# Patient Record
Sex: Male | Born: 1970 | Race: Black or African American | Hispanic: No | Marital: Single | Smoking: Never smoker
Health system: Southern US, Community
[De-identification: ages and names within clinical notes are randomized; demographics above are authoritative.]

## PROBLEM LIST (undated history)

## (undated) VITALS — BP 116/58 | HR 72 | Temp 98.1°F | Resp 16 | Ht 71.0 in | Wt 162.2 lb

## (undated) VITALS — BP 116/59 | HR 80 | Temp 99.1°F | Resp 16 | Ht 71.0 in | Wt 178.5 lb

## (undated) VITALS — BP 121/66 | HR 78 | Temp 99.1°F | Resp 14

## (undated) DIAGNOSIS — D571 Sickle-cell disease without crisis: Secondary | ICD-10-CM

## (undated) DIAGNOSIS — F329 Major depressive disorder, single episode, unspecified: Secondary | ICD-10-CM

## (undated) DIAGNOSIS — R5382 Chronic fatigue, unspecified: Secondary | ICD-10-CM

## (undated) DIAGNOSIS — I509 Heart failure, unspecified: Secondary | ICD-10-CM

## (undated) DIAGNOSIS — E875 Hyperkalemia: Secondary | ICD-10-CM

## (undated) DIAGNOSIS — F32A Depression, unspecified: Secondary | ICD-10-CM

## (undated) DIAGNOSIS — I471 Supraventricular tachycardia, unspecified: Secondary | ICD-10-CM

## (undated) DIAGNOSIS — G9332 Myalgic encephalomyelitis/chronic fatigue syndrome: Secondary | ICD-10-CM

## (undated) DIAGNOSIS — F419 Anxiety disorder, unspecified: Secondary | ICD-10-CM

## (undated) DIAGNOSIS — M25549 Pain in joints of unspecified hand: Secondary | ICD-10-CM

## (undated) DIAGNOSIS — N2589 Other disorders resulting from impaired renal tubular function: Secondary | ICD-10-CM

## (undated) HISTORY — DX: Major depressive disorder, single episode, unspecified: F32.9

## (undated) HISTORY — DX: Depression, unspecified: F32.A

## (undated) HISTORY — DX: Anxiety disorder, unspecified: F41.9

## (undated) HISTORY — DX: Chronic fatigue, unspecified: R53.82

## (undated) HISTORY — DX: Heart failure, unspecified: I50.9

## (undated) HISTORY — PX: CHOLECYSTECTOMY: SHX55

## (undated) HISTORY — DX: Myalgic encephalomyelitis/chronic fatigue syndrome: G93.32

## (undated) HISTORY — PX: APPENDECTOMY: SHX54

## (undated) HISTORY — PX: SPLENECTOMY: SUR1306

---

## 2006-05-03 ENCOUNTER — Emergency Department (HOSPITAL_COMMUNITY): Admission: EM | Admit: 2006-05-03 | Discharge: 2006-05-03 | Payer: Self-pay | Admitting: *Deleted

## 2006-05-09 ENCOUNTER — Inpatient Hospital Stay (HOSPITAL_COMMUNITY): Admission: EM | Admit: 2006-05-09 | Discharge: 2006-05-10 | Payer: Self-pay | Admitting: *Deleted

## 2006-07-29 ENCOUNTER — Inpatient Hospital Stay (HOSPITAL_COMMUNITY): Admission: EM | Admit: 2006-07-29 | Discharge: 2006-07-31 | Payer: Self-pay | Admitting: Emergency Medicine

## 2006-10-05 ENCOUNTER — Inpatient Hospital Stay (HOSPITAL_COMMUNITY): Admission: EM | Admit: 2006-10-05 | Discharge: 2006-10-06 | Payer: Self-pay | Admitting: Emergency Medicine

## 2006-11-28 ENCOUNTER — Inpatient Hospital Stay (HOSPITAL_COMMUNITY): Admission: EM | Admit: 2006-11-28 | Discharge: 2006-11-30 | Payer: Self-pay | Admitting: Emergency Medicine

## 2007-03-19 ENCOUNTER — Inpatient Hospital Stay (HOSPITAL_COMMUNITY): Admission: EM | Admit: 2007-03-19 | Discharge: 2007-03-22 | Payer: Self-pay | Admitting: Emergency Medicine

## 2007-09-18 ENCOUNTER — Inpatient Hospital Stay (HOSPITAL_COMMUNITY): Admission: EM | Admit: 2007-09-18 | Discharge: 2007-09-23 | Payer: Self-pay | Admitting: Emergency Medicine

## 2007-09-18 ENCOUNTER — Ambulatory Visit: Payer: Self-pay | Admitting: Cardiovascular Disease

## 2007-09-20 ENCOUNTER — Encounter (INDEPENDENT_AMBULATORY_CARE_PROVIDER_SITE_OTHER): Payer: Self-pay | Admitting: Internal Medicine

## 2007-09-21 ENCOUNTER — Encounter: Payer: Self-pay | Admitting: Cardiology

## 2008-03-29 ENCOUNTER — Inpatient Hospital Stay (HOSPITAL_COMMUNITY): Admission: EM | Admit: 2008-03-29 | Discharge: 2008-04-01 | Payer: Self-pay | Admitting: Emergency Medicine

## 2008-10-07 ENCOUNTER — Inpatient Hospital Stay (HOSPITAL_COMMUNITY): Admission: EM | Admit: 2008-10-07 | Discharge: 2008-10-09 | Payer: Self-pay | Admitting: Emergency Medicine

## 2009-07-18 ENCOUNTER — Inpatient Hospital Stay (HOSPITAL_COMMUNITY): Admission: EM | Admit: 2009-07-18 | Discharge: 2009-07-20 | Payer: Self-pay | Admitting: Emergency Medicine

## 2009-12-27 ENCOUNTER — Inpatient Hospital Stay (HOSPITAL_COMMUNITY)
Admission: EM | Admit: 2009-12-27 | Discharge: 2009-12-29 | Payer: Self-pay | Source: Home / Self Care | Admitting: Emergency Medicine

## 2010-02-07 ENCOUNTER — Encounter
Admission: RE | Admit: 2010-02-07 | Discharge: 2010-02-07 | Payer: Self-pay | Source: Home / Self Care | Attending: Internal Medicine | Admitting: Internal Medicine

## 2010-04-16 LAB — CBC
Hemoglobin: 7.1 g/dL — ABNORMAL LOW (ref 13.0–17.0)
MCH: 35.5 pg — ABNORMAL HIGH (ref 26.0–34.0)
MCH: 36.2 pg — ABNORMAL HIGH (ref 26.0–34.0)
MCHC: 35.5 g/dL (ref 30.0–36.0)
MCHC: 36.3 g/dL — ABNORMAL HIGH (ref 30.0–36.0)
MCV: 100.2 fL — ABNORMAL HIGH (ref 78.0–100.0)
Platelets: 276 10*3/uL (ref 150–400)
Platelets: 280 10*3/uL (ref 150–400)
RBC: 1.9 MIL/uL — ABNORMAL LOW (ref 4.22–5.81)
RBC: 1.99 MIL/uL — ABNORMAL LOW (ref 4.22–5.81)
RDW: 25.2 % — ABNORMAL HIGH (ref 11.5–15.5)
RDW: 28.6 % — ABNORMAL HIGH (ref 11.5–15.5)
WBC: 13.2 10*3/uL — ABNORMAL HIGH (ref 4.0–10.5)
WBC: 13.3 10*3/uL — ABNORMAL HIGH (ref 4.0–10.5)
WBC: 13.7 10*3/uL — ABNORMAL HIGH (ref 4.0–10.5)

## 2010-04-16 LAB — SAMPLE TO BLOOD BANK

## 2010-04-16 LAB — DIFFERENTIAL
Basophils Absolute: 0.1 10*3/uL (ref 0.0–0.1)
Basophils Absolute: 0.1 10*3/uL (ref 0.0–0.1)
Basophils Relative: 1 % (ref 0–1)
Eosinophils Absolute: 0.8 10*3/uL — ABNORMAL HIGH (ref 0.0–0.7)
Eosinophils Relative: 6 % — ABNORMAL HIGH (ref 0–5)
Lymphocytes Relative: 43 % (ref 12–46)
Lymphs Abs: 4.5 10*3/uL — ABNORMAL HIGH (ref 0.7–4.0)
Lymphs Abs: 5.7 10*3/uL — ABNORMAL HIGH (ref 0.7–4.0)
Monocytes Absolute: 1.1 10*3/uL — ABNORMAL HIGH (ref 0.1–1.0)
Monocytes Absolute: 1.3 10*3/uL — ABNORMAL HIGH (ref 0.1–1.0)
Monocytes Relative: 10 % (ref 3–12)
Monocytes Relative: 8 % (ref 3–12)
Neutro Abs: 5.3 10*3/uL (ref 1.7–7.7)
Neutrophils Relative %: 40 % — ABNORMAL LOW (ref 43–77)
Neutrophils Relative %: 55 % (ref 43–77)

## 2010-04-16 LAB — URINALYSIS, ROUTINE W REFLEX MICROSCOPIC
Ketones, ur: NEGATIVE mg/dL
Leukocytes, UA: NEGATIVE
Nitrite: NEGATIVE
Protein, ur: NEGATIVE mg/dL
Urobilinogen, UA: 1 mg/dL (ref 0.0–1.0)
pH: 6 (ref 5.0–8.0)

## 2010-04-16 LAB — TYPE AND SCREEN
ABO/RH(D): A POS
Unit division: 0

## 2010-04-16 LAB — RETICULOCYTES
RBC.: 1.86 MIL/uL — ABNORMAL LOW (ref 4.22–5.81)
Retic Ct Pct: 18.6 % — ABNORMAL HIGH (ref 0.4–3.1)

## 2010-04-16 LAB — PREPARE RBC (CROSSMATCH)

## 2010-04-16 LAB — BASIC METABOLIC PANEL
Chloride: 111 mEq/L (ref 96–112)
GFR calc Af Amer: >60 mL/min (ref >60)
Potassium: 4.7 mEq/L (ref 3.5–5.1)

## 2010-04-21 LAB — URINALYSIS, ROUTINE W REFLEX MICROSCOPIC
Glucose, UA: NEGATIVE mg/dL
Leukocytes, UA: NEGATIVE
Protein, ur: 30 mg/dL — AB
pH: 6.5 (ref 5.0–8.0)

## 2010-04-21 LAB — DIFFERENTIAL
Eosinophils Absolute: 0.7 10*3/uL (ref 0.0–0.7)
Lymphs Abs: 5.8 10*3/uL — ABNORMAL HIGH (ref 0.7–4.0)
Monocytes Absolute: 1.5 10*3/uL — ABNORMAL HIGH (ref 0.1–1.0)
Neutrophils Relative %: 57 % (ref 43–77)

## 2010-04-21 LAB — BASIC METABOLIC PANEL
CO2: 23 mEq/L (ref 19–32)
CO2: 24 mEq/L (ref 19–32)
CO2: 24 mEq/L (ref 19–32)
Chloride: 108 mEq/L (ref 96–112)
Chloride: 111 mEq/L (ref 96–112)
Chloride: 115 mEq/L — ABNORMAL HIGH (ref 96–112)
Creatinine, Ser: 0.6 mg/dL (ref 0.4–1.5)
Creatinine, Ser: 0.72 mg/dL (ref 0.4–1.5)
Creatinine, Ser: 0.89 mg/dL (ref 0.4–1.5)
GFR calc Af Amer: >60 mL/min (ref >60)
GFR calc Af Amer: >60 mL/min (ref >60)
GFR calc Af Amer: >60 mL/min (ref >60)
Glucose, Bld: 100 mg/dL — ABNORMAL HIGH (ref 70–99)
Potassium: 4.7 mEq/L (ref 3.5–5.1)
Potassium: 5.3 mEq/L — ABNORMAL HIGH (ref 3.5–5.1)
Sodium: 139 mEq/L (ref 135–145)

## 2010-04-21 LAB — CBC
Hemoglobin: 7.5 g/dL — ABNORMAL LOW (ref 13.0–17.0)
Hemoglobin: 8.3 g/dL — ABNORMAL LOW (ref 13.0–17.0)
MCHC: 34.5 g/dL (ref 30.0–36.0)
MCHC: 35.8 g/dL (ref 30.0–36.0)
MCHC: 36.1 g/dL — ABNORMAL HIGH (ref 30.0–36.0)
MCV: 95.9 fL (ref 78.0–100.0)
MCV: 97.7 fL (ref 78.0–100.0)
Platelets: 261 10*3/uL (ref 150–400)
RBC: 2.21 MIL/uL — ABNORMAL LOW (ref 4.22–5.81)
RBC: 2.4 MIL/uL — ABNORMAL LOW (ref 4.22–5.81)
RDW: 26.4 % — ABNORMAL HIGH (ref 11.5–15.5)
RDW: 29 % — ABNORMAL HIGH (ref 11.5–15.5)
WBC: 14.4 10*3/uL — ABNORMAL HIGH (ref 4.0–10.5)

## 2010-04-21 LAB — CROSSMATCH

## 2010-04-21 LAB — URINE MICROSCOPIC-ADD ON

## 2010-04-21 LAB — RETICULOCYTES
RBC.: 1.8 MIL/uL — ABNORMAL LOW (ref 4.22–5.81)
Retic Ct Pct: 19.1 % — ABNORMAL HIGH (ref 0.4–3.1)

## 2010-04-21 LAB — CULTURE, BLOOD (ROUTINE X 2)

## 2010-04-21 LAB — TSH: TSH: 2.081 u[IU]/mL (ref 0.350–4.500)

## 2010-05-10 LAB — CROSSMATCH

## 2010-05-10 LAB — CBC
Hemoglobin: 8.8 g/dL — ABNORMAL LOW (ref 13.0–17.0)
MCHC: 35.8 g/dL (ref 30.0–36.0)
MCHC: 36.3 g/dL — ABNORMAL HIGH (ref 30.0–36.0)
MCV: 103.5 fL — ABNORMAL HIGH (ref 78.0–100.0)
Platelets: 303 10*3/uL (ref 150–400)
RBC: 2.43 MIL/uL — ABNORMAL LOW (ref 4.22–5.81)
WBC: 21.2 10*3/uL — ABNORMAL HIGH (ref 4.0–10.5)

## 2010-05-10 LAB — DIFFERENTIAL
Basophils Absolute: 0.2 10*3/uL — ABNORMAL HIGH (ref 0.0–0.1)
Lymphs Abs: 7 10*3/uL — ABNORMAL HIGH (ref 0.7–4.0)
Monocytes Absolute: 2.1 10*3/uL — ABNORMAL HIGH (ref 0.1–1.0)
Neutrophils Relative %: 51 % (ref 43–77)

## 2010-05-10 LAB — POCT I-STAT, CHEM 8
BUN: 18 mg/dL (ref 6–23)
Chloride: 106 mEq/L (ref 96–112)
Creatinine, Ser: 0.8 mg/dL (ref 0.4–1.5)
Glucose, Bld: 96 mg/dL (ref 70–99)
HCT: 19 % — ABNORMAL LOW (ref 39.0–52.0)
Potassium: 4.5 mEq/L (ref 3.5–5.1)

## 2010-05-10 LAB — BASIC METABOLIC PANEL
CO2: 26 mEq/L (ref 19–32)
Calcium: 8.2 mg/dL — ABNORMAL LOW (ref 8.4–10.5)
Glucose, Bld: 94 mg/dL (ref 70–99)
Sodium: 135 mEq/L (ref 135–145)

## 2010-05-10 LAB — HEMOGLOBIN AND HEMATOCRIT, BLOOD: HCT: 23.9 % — ABNORMAL LOW (ref 39.0–52.0)

## 2010-05-16 ENCOUNTER — Encounter: Payer: Self-pay | Admitting: Internal Medicine

## 2010-05-21 LAB — TSH: TSH: 1.543 u[IU]/mL (ref 0.350–4.500)

## 2010-05-21 LAB — COMPREHENSIVE METABOLIC PANEL
ALT: 49 U/L (ref 0–53)
AST: 99 U/L — ABNORMAL HIGH (ref 0–37)
Albumin: 2.7 g/dL — ABNORMAL LOW (ref 3.5–5.2)
Albumin: 2.8 g/dL — ABNORMAL LOW (ref 3.5–5.2)
Alkaline Phosphatase: 90 U/L (ref 39–117)
BUN: 12 mg/dL (ref 6–23)
BUN: 7 mg/dL (ref 6–23)
CO2: 25 mEq/L (ref 19–32)
CO2: 26 mEq/L (ref 19–32)
Calcium: 8.2 mg/dL — ABNORMAL LOW (ref 8.4–10.5)
Calcium: 8.3 mg/dL — ABNORMAL LOW (ref 8.4–10.5)
Chloride: 106 mEq/L (ref 96–112)
Chloride: 109 mEq/L (ref 96–112)
Creatinine, Ser: 0.53 mg/dL (ref 0.4–1.5)
Creatinine, Ser: 0.6 mg/dL (ref 0.4–1.5)
Creatinine, Ser: 0.62 mg/dL (ref 0.4–1.5)
GFR calc Af Amer: >60 mL/min (ref >60)
GFR calc non Af Amer: >60 mL/min (ref >60)
Glucose, Bld: 88 mg/dL (ref 70–99)
Potassium: 4.3 mEq/L (ref 3.5–5.1)
Potassium: 4.3 mEq/L (ref 3.5–5.1)
Sodium: 136 mEq/L (ref 135–145)
Total Bilirubin: 3 mg/dL — ABNORMAL HIGH (ref 0.3–1.2)
Total Bilirubin: 4.2 mg/dL — ABNORMAL HIGH (ref 0.3–1.2)
Total Protein: 6.3 g/dL (ref 6.0–8.3)

## 2010-05-21 LAB — CK TOTAL AND CKMB (NOT AT ARMC)
CK, MB: 0.4 ng/mL (ref 0.3–4.0)
CK, MB: 0.5 ng/mL (ref 0.3–4.0)
CK, MB: 0.5 ng/mL (ref 0.3–4.0)
Relative Index: INVALID (ref 0.0–2.5)
Relative Index: INVALID (ref 0.0–2.5)
Total CK: 24 U/L (ref 7–232)
Total CK: 32 U/L (ref 7–232)

## 2010-05-21 LAB — RETICULOCYTES
Retic Count, Absolute: 410.1 10*3/uL — ABNORMAL HIGH (ref 19.0–186.0)
Retic Ct Pct: 21.7 % — ABNORMAL HIGH (ref 0.4–3.1)

## 2010-05-21 LAB — CBC
HCT: 19.4 % — ABNORMAL LOW (ref 39.0–52.0)
HCT: 26.3 % — ABNORMAL LOW (ref 39.0–52.0)
Hemoglobin: 7.1 g/dL — CL (ref 13.0–17.0)
Hemoglobin: 9.1 g/dL — ABNORMAL LOW (ref 13.0–17.0)
MCV: 102.3 fL — ABNORMAL HIGH (ref 78.0–100.0)
MCV: 102.5 fL — ABNORMAL HIGH (ref 78.0–100.0)
Platelets: 338 10*3/uL (ref 150–400)
RBC: 1.89 MIL/uL — ABNORMAL LOW (ref 4.22–5.81)
RBC: 2.6 MIL/uL — ABNORMAL LOW (ref 4.22–5.81)
WBC: 10.9 10*3/uL — ABNORMAL HIGH (ref 4.0–10.5)
WBC: 12.4 10*3/uL — ABNORMAL HIGH (ref 4.0–10.5)
WBC: 21.4 10*3/uL — ABNORMAL HIGH (ref 4.0–10.5)

## 2010-05-21 LAB — DIFFERENTIAL
Basophils Absolute: 0 10*3/uL (ref 0.0–0.1)
Basophils Absolute: 0.1 10*3/uL (ref 0.0–0.1)
Basophils Relative: 0 % (ref 0–1)
Basophils Relative: 1 % (ref 0–1)
Basophils Relative: 2 % — ABNORMAL HIGH (ref 0–1)
Eosinophils Absolute: 0.8 10*3/uL — ABNORMAL HIGH (ref 0.0–0.7)
Eosinophils Relative: 6 % — ABNORMAL HIGH (ref 0–5)
Lymphocytes Relative: 33 % (ref 12–46)
Lymphocytes Relative: 43 % (ref 12–46)
Monocytes Absolute: 1 10*3/uL (ref 0.1–1.0)
Monocytes Relative: 6 % (ref 3–12)
Neutro Abs: 12.4 10*3/uL — ABNORMAL HIGH (ref 1.7–7.7)
Neutro Abs: 5 10*3/uL (ref 1.7–7.7)
Neutrophils Relative %: 40 % — ABNORMAL LOW (ref 43–77)
Neutrophils Relative %: 46 % (ref 43–77)

## 2010-05-21 LAB — CULTURE, BLOOD (ROUTINE X 2)
Culture: NO GROWTH
Culture: NO GROWTH

## 2010-05-21 LAB — POCT I-STAT, CHEM 8
Creatinine, Ser: 0.8 mg/dL (ref 0.4–1.5)
Hemoglobin: 7.1 g/dL — CL (ref 13.0–17.0)
Sodium: 141 mEq/L (ref 135–145)
TCO2: 26 mmol/L (ref 0–100)

## 2010-05-21 LAB — MAGNESIUM: Magnesium: 2.1 mg/dL (ref 1.5–2.5)

## 2010-05-21 LAB — URINALYSIS, ROUTINE W REFLEX MICROSCOPIC
Bilirubin Urine: NEGATIVE
Glucose, UA: NEGATIVE mg/dL
Ketones, ur: NEGATIVE mg/dL
Leukocytes, UA: NEGATIVE
Protein, ur: 100 mg/dL — AB

## 2010-05-21 LAB — PATHOLOGIST SMEAR REVIEW

## 2010-05-21 LAB — TROPONIN I: Troponin I: 0.01 ng/mL (ref 0.00–0.06)

## 2010-05-21 LAB — CROSSMATCH

## 2010-05-21 LAB — PHOSPHORUS: Phosphorus: 4.1 mg/dL (ref 2.3–4.6)

## 2010-05-21 LAB — BRAIN NATRIURETIC PEPTIDE: Pro B Natriuretic peptide (BNP): 95.7 pg/mL (ref 0.0–100.0)

## 2010-05-21 LAB — URINE MICROSCOPIC-ADD ON

## 2010-05-21 LAB — HOMOCYSTEINE: Homocysteine: 9.9 umol/L (ref 4.0–15.4)

## 2010-05-22 ENCOUNTER — Emergency Department (HOSPITAL_COMMUNITY): Payer: BC Managed Care – PPO

## 2010-05-22 ENCOUNTER — Inpatient Hospital Stay (HOSPITAL_COMMUNITY)
Admission: EM | Admit: 2010-05-22 | Discharge: 2010-05-27 | DRG: 574 | Disposition: A | Discharge status: Home / Self Care | Payer: BC Managed Care – PPO | Attending: Internal Medicine | Admitting: Internal Medicine

## 2010-05-22 DIAGNOSIS — D5700 Hb-SS disease with crisis, unspecified: Principal | ICD-10-CM | POA: Diagnosis present

## 2010-05-22 DIAGNOSIS — K599 Functional intestinal disorder, unspecified: Secondary | ICD-10-CM | POA: Diagnosis present

## 2010-05-22 DIAGNOSIS — J189 Pneumonia, unspecified organism: Secondary | ICD-10-CM | POA: Diagnosis present

## 2010-05-22 DIAGNOSIS — I509 Heart failure, unspecified: Secondary | ICD-10-CM | POA: Diagnosis not present

## 2010-05-22 DIAGNOSIS — Z9089 Acquired absence of other organs: Secondary | ICD-10-CM

## 2010-05-22 DIAGNOSIS — N289 Disorder of kidney and ureter, unspecified: Secondary | ICD-10-CM | POA: Diagnosis present

## 2010-05-22 DIAGNOSIS — E860 Dehydration: Secondary | ICD-10-CM | POA: Diagnosis present

## 2010-05-22 LAB — HEPATIC FUNCTION PANEL
ALT: 44 U/L (ref 0–53)
Albumin: 3.1 g/dL — ABNORMAL LOW (ref 3.5–5.2)
Alkaline Phosphatase: 116 U/L (ref 39–117)
Total Protein: 6.9 g/dL (ref 6.0–8.3)

## 2010-05-22 LAB — DIFFERENTIAL
Blasts: 0 %
Metamyelocytes Relative: 0 %
Monocytes Relative: 3 % (ref 3–12)
Myelocytes: 0 %
nRBC: 0 /100 WBC

## 2010-05-22 LAB — CBC
HCT: 17.8 % — ABNORMAL LOW (ref 39.0–52.0)
MCV: 91.8 fL (ref 78.0–100.0)
RBC: 1.94 MIL/uL — ABNORMAL LOW (ref 4.22–5.81)
WBC: 22.5 10*3/uL — ABNORMAL HIGH (ref 4.0–10.5)

## 2010-05-22 LAB — BLOOD GAS, ARTERIAL
Acid-base deficit: 3.5 mmol/L — ABNORMAL HIGH (ref 0.0–2.0)
Drawn by: 30996
O2 Content: 4 L/min
O2 Saturation: 94.1 %
TCO2: 21.1 mmol/L (ref 0–100)
pO2, Arterial: 90.2 mmHg (ref 80.0–100.0)

## 2010-05-22 LAB — BASIC METABOLIC PANEL
Chloride: 107 mEq/L (ref 96–112)
GFR calc Af Amer: 52 mL/min — ABNORMAL LOW (ref >60)
Potassium: 4.9 mEq/L (ref 3.5–5.1)

## 2010-05-22 LAB — URINALYSIS, ROUTINE W REFLEX MICROSCOPIC
Glucose, UA: NEGATIVE mg/dL
pH: 5.5 (ref 5.0–8.0)

## 2010-05-22 LAB — RETICULOCYTES: Retic Count, Absolute: 349.2 10*3/uL — ABNORMAL HIGH (ref 19.0–186.0)

## 2010-05-22 LAB — URINE MICROSCOPIC-ADD ON

## 2010-05-23 ENCOUNTER — Inpatient Hospital Stay (HOSPITAL_COMMUNITY): Payer: BC Managed Care – PPO

## 2010-05-23 LAB — CBC
HCT: 20.4 % — ABNORMAL LOW (ref 39.0–52.0)
Hemoglobin: 7.2 g/dL — ABNORMAL LOW (ref 13.0–17.0)
MCH: 32.6 pg (ref 26.0–34.0)
MCHC: 35.3 g/dL (ref 30.0–36.0)
MCV: 92.3 fL (ref 78.0–100.0)

## 2010-05-23 LAB — COMPREHENSIVE METABOLIC PANEL
ALT: 45 U/L (ref 0–53)
AST: 132 U/L — ABNORMAL HIGH (ref 0–37)
Alkaline Phosphatase: 87 U/L (ref 39–117)
Calcium: 7.9 mg/dL — ABNORMAL LOW (ref 8.4–10.5)
GFR calc Af Amer: >60 mL/min (ref >60)
Glucose, Bld: 92 mg/dL (ref 70–99)
Potassium: 4.3 mEq/L (ref 3.5–5.1)
Sodium: 138 mEq/L (ref 135–145)
Total Protein: 6.4 g/dL (ref 6.0–8.3)

## 2010-05-23 LAB — URINE CULTURE
Colony Count: NO GROWTH
Culture: NO GROWTH

## 2010-05-24 LAB — TYPE AND SCREEN
Antibody Screen: NEGATIVE
Unit division: 0

## 2010-05-24 LAB — CBC
HCT: 23.6 % — ABNORMAL LOW (ref 39.0–52.0)
MCHC: 34.3 g/dL (ref 30.0–36.0)
Platelets: 267 10*3/uL (ref 150–400)
RDW: 26.7 % — ABNORMAL HIGH (ref 11.5–15.5)

## 2010-05-24 LAB — DIFFERENTIAL
Basophils Absolute: 0.1 10*3/uL (ref 0.0–0.1)
Eosinophils Absolute: 0.7 10*3/uL (ref 0.0–0.7)
Eosinophils Relative: 5 % (ref 0–5)
Lymphs Abs: 3.9 10*3/uL (ref 0.7–4.0)
Monocytes Absolute: 1.8 10*3/uL — ABNORMAL HIGH (ref 0.1–1.0)
Neutrophils Relative %: 50 % (ref 43–77)

## 2010-05-24 LAB — BRAIN NATRIURETIC PEPTIDE: Pro B Natriuretic peptide (BNP): 120 pg/mL — ABNORMAL HIGH (ref 0.0–100.0)

## 2010-05-24 LAB — COMPREHENSIVE METABOLIC PANEL
ALT: 40 U/L (ref 0–53)
Albumin: 2.8 g/dL — ABNORMAL LOW (ref 3.5–5.2)
Calcium: 8.3 mg/dL — ABNORMAL LOW (ref 8.4–10.5)
Glucose, Bld: 93 mg/dL (ref 70–99)
Sodium: 139 mEq/L (ref 135–145)
Total Protein: 6.4 g/dL (ref 6.0–8.3)

## 2010-05-24 LAB — MAGNESIUM: Magnesium: 2.4 mg/dL (ref 1.5–2.5)

## 2010-05-25 LAB — RETICULOCYTES
Retic Count, Absolute: 554.7 10*3/uL — ABNORMAL HIGH (ref 19.0–186.0)
Retic Ct Pct: 21.5 % — ABNORMAL HIGH (ref 0.4–3.1)

## 2010-05-25 LAB — COMPREHENSIVE METABOLIC PANEL
AST: 79 U/L — ABNORMAL HIGH (ref 0–37)
Albumin: 2.8 g/dL — ABNORMAL LOW (ref 3.5–5.2)
Calcium: 8.4 mg/dL (ref 8.4–10.5)
Creatinine, Ser: 0.66 mg/dL (ref 0.4–1.5)
GFR calc Af Amer: >60 mL/min (ref >60)
Total Protein: 6.4 g/dL (ref 6.0–8.3)

## 2010-05-25 LAB — CBC
Platelets: 249 10*3/uL (ref 150–400)
RBC: 2.58 MIL/uL — ABNORMAL LOW (ref 4.22–5.81)
WBC: 11.3 10*3/uL — ABNORMAL HIGH (ref 4.0–10.5)

## 2010-05-26 ENCOUNTER — Inpatient Hospital Stay (HOSPITAL_COMMUNITY): Payer: BC Managed Care – PPO

## 2010-05-27 LAB — CBC
HCT: 23.3 % — ABNORMAL LOW (ref 39.0–52.0)
Hemoglobin: 7.8 g/dL — ABNORMAL LOW (ref 13.0–17.0)
MCH: 32.1 pg (ref 26.0–34.0)
MCHC: 33.5 g/dL (ref 30.0–36.0)

## 2010-05-27 LAB — BASIC METABOLIC PANEL
BUN: 13 mg/dL (ref 6–23)
Chloride: 108 mEq/L (ref 96–112)
Glucose, Bld: 92 mg/dL (ref 70–99)
Potassium: 4.4 mEq/L (ref 3.5–5.1)

## 2010-05-27 LAB — BRAIN NATRIURETIC PEPTIDE: Pro B Natriuretic peptide (BNP): 81.6 pg/mL (ref 0.0–100.0)

## 2010-05-28 LAB — CULTURE, BLOOD (ROUTINE X 2)
Culture  Setup Time: 201204182203
Culture: NO GROWTH
Culture: NO GROWTH

## 2010-05-30 NOTE — H&P (Signed)
NAMESEANN, GENTHER              ACCOUNT NO.:  [id number]  MEDICAL RECORD NO.:  [id number]           PATIENT TYPE:  E  LOCATION:  WLED                         FACILITY:  [facility]  PHYSICIAN:  Lonia Blood, M.D.DATE OF BIRTH:  24-Jan-1971  DATE OF ADMISSION:  05/22/2010 DATE OF DISCHARGE:                             HISTORY & PHYSICAL   PRIMARY CARE PHYSICIAN:  Eric L. August Saucer, MD  CHIEF COMPLAINT:  Chest, shoulder, hip and back pain.  HISTORY OF PRESENT ILLNESS:  Mr. [name] is a very pleasant 40- year-old gentleman with a longstanding history of sickle cell disease. He has a history of splenectomy.  He reports that he has two to three sickle cell crises in a year at max.  Typically he presents for these with chest, shoulder, hip and back pain.  This is exactly the presentation that he gives today.  His symptoms began approximately 14 to 16 hours ago.  His chest pain has been primarily lateral and pleuritic in nature.  He has had some dyspnea but not severe.  He has had some nausea and poor p.o. intake.  He denies vomiting.  He denies diarrhea.  He denies appreciable fevers or chills, though he has been noted to have a fever during his stay in the emergency room.  He denies erythematous or warm joints but states that his hips and  shoulders ache bilaterally.  He denies upper respiratory type symptoms.  He denies dysuria, polyuria or hematuria.  REVIEW OF SYSTEMS:  A comprehensive review of systems is completed and 10 systems are unremarkable with the exception to the positive elements noted in the history of present illness above.  PAST MEDICAL HISTORY: 1. Sickle cell disease. 2. 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 3. Status post surgical splenectomy. 4. Status post cholecystectomy. 5. Status post appendectomy.  OUTPATIENT MEDICATIONS: 1. Percocet 2.  Restoril. 3. Folate. 4. Methadone. 5. Ibuprofen. 6. Vitamin C.  ALLERGIES:  No known drug allergies.  FAMILY HISTORY:  Negative for premature coronary artery disease, diabetes or hypertension in both maternal and paternal lineages.  The patient has a daughter with sickle cell trait.  SOCIAL HISTORY:  The patient does not smoke, drink alcohol or use illicit drugs.  He works with [employer].  He lives in the [place] area but is originally from [place].  DATA REVIEW:  Showed white count is elevated 23,000, platelet count is normal, hemoglobin as low as 6.3 with MCV of 92.  Sodium is normal at 139 with normal chloride, bicarb and potassium.  BUN is elevated at 30 with a creatinine of 1.76.  Serum glucose of 103, calcium is normal. Alk phos is normal.  AST is elevated at 148 with a normal ALT, total bili is markedly elevated at almost 11, albumin is 3.1. Absolute reticulocyte count is markedly elevated at 350.  Urinalysis is positive for nitrite and small leukocyte esterase, but only 0-2 white blood cells and 0-2 red blood cells.  Chest x-ray reveals cardiomegaly with venous hypertension and  prominence of the interstitial markings consistent with edema versus pneumonia.  PHYSICAL EXAMINATION:  VITAL SIGNS:  Temperature 99.9, blood pressure 133/66, heart rate 128, respiratory rate 20, O2 saturation as low as 75% on room air but presently 92% to 94% on 4 L per minute nasal cannula. GENERAL:  Well-developed, well-nourished gentleman, in no acute respiratory distress.  HEENT:  Normocephalic, atraumatic.  Pupils equal, round, react to light and accommodation.  Extraocular muscles intact bilaterally.  Sclerae are markedly icteric and the mucous membranes of the oral and pharyngeal cavity are markedly dry.  NECK:  No JVD.  LUNGS: Very faint fine crackles diffusely with no wheezes and good air movement throughout all fields otherwise.  CARDIOVASCULAR:  Tachycardic but regular without gallop or  rub with normal S1, S2.  ABDOMEN:  Nontender, nondistended.  Bowel sounds present.  No organomegaly, no rebound or ascites.  EXTREMITIES:  No significant cyanosis, clubbing, edema bilateral lower extremities.  NEUROLOGIC:  Alert and oriented x4. Cranial II-XII intact bilaterally, 5/5 strength bilateral upper and lower extremities.  Intact sensation to touch throughout.  IMPRESSION AND PLAN: 1. Acute vaso-occlusive sickle cell pain crisis:  The patient will be     admitted to the hospital.  We will utilize the standing sickle cell     admit order form.  The patient will receive judicious volumes of     half-normal saline in that he is clearly dehydrated and also     suffering with an acute sickling crisis.  We will consult Dr. August Saucer,     our local sickle cell specialist, to consider assuming his care in     the morning.  The patient is known to Dr. August Saucer in the outpatient     setting. 2. Acute on chronic anemia secondary to hemolysis due to sickle cell     disease:  The patient has a baseline hemoglobin of 7.  He reports     that he routinely requires transfusions at the time of his crises     on bi- to tri- annual basis.  The patient has already been given 1     unit of blood in the emergency room.  We will hold on any further     transfusion at this time and defer to Dr. Diamantina Providence expertise in     regard to the necessity of such. 3. Acute renal insufficiency:  The patient has a normal baseline     creatinine.  He presents with a significantly elevated creatinine     at approximately 1.8 today.  Clinically he is severely dry.  This     appears to most likely be related to significant volume     depletion/prerenal azotemia.  We will aggressively hydrate the     patient.  We will recheck his VWU:[id number] ratio in the morning. 4. Possible urinary tract infection:  The patient's urinalysis is not     normal.  This could be simply a sequelae of his severe dehydration     and sickling.  The  patient will empirically be covered with     antibiotics as discussed below and this should provide appropriate     coverage for potential urinary pathogens as well. 5. Possible pneumonia:  We must consider the possibility of pneumonia     in this patient.  It is notable that he is status post splenectomy.     We must also consider the possibility of a chest syndrome.  At the     present  time the patient is stable.  His O2 saturations have     responded nicely to volume as well as oxygen.  We will continue to     aggressively hydrate the patient.  We will monitor his O2     saturations closely and monitor him clinically.  Given the fact     that he does not have a spleen, we will empirically cover him for     community-acquired pathogens.  Levaquin should be adequate at this     time.  We will follow the patient clinically.  Blood cultures     have been accomplished. 6. Marked dehydration:  The patient is clearly markedly dehydrated.     As discussed above, he will be hydrated aggressively.     Lonia Blood, M.D.     JTM/MEDQ  D:  05/22/2010  T:  05/22/2010  Job:  [id number]  cc:   Minerva Areola L. August Saucer, M.D. [address] [address] [place] [address]  Electronically Signed by Jetty Duhamel M.D. on 05/30/2010 08:44:48 AM

## 2010-06-18 NOTE — Discharge Summary (Signed)
[name], [name]              ACCOUNT NO.:  [id number]   MEDICAL RECORD NO.:  [id number]          PATIENT TYPE:  INP   LOCATION:  1430                         FACILITY:  [facility]   PHYSICIAN:  Ruthy Dick, MD    DATE OF BIRTH:  [birth year]   DATE OF ADMISSION:  03/29/2008  DATE OF DISCHARGE:  04/01/2008                               DISCHARGE SUMMARY   REASON FOR ADMISSION:  Sickle cell painful crisis.   FINAL DISCHARGE DIAGNOSES:  1. Sickle cell painful crisis.  2. Hypoxia, resolved.  3. Sickle cell anemia, status post transfusion of 2 units of packed      red blood cells.  4. Chronic pain syndrome, narcotic dependent.   PROCEDURES:  1. CT angiogram of the chest was done and was read as being sub-      optimal but no evidence of central pulmonary embolus. At this time,      the patient symptoms of hypoxia also resolved.   HISTORY OF PRESENT ILLNESS:  This is a 40 year old gentleman with sickle  cell disease, who woke up in the middle of the night with severe chest  and back pain. Because of this, he came to the hospital and there was a  possibility of acute chest syndrome being entertained. However, his  symptoms resolved with pain medications and the CT angiogram did not  show central pulmonary embolus. He was also on IV hydration, which also  helped relieve his symptoms. So far today, the patient is doing very  well. He says he is ready to go home. No more chest pain, no shortness  of breath, no pains whatsoever.   PHYSICAL EXAMINATION:  VITAL SIGNS:  Temperature 98.0, pulse 90,  respiratory rate 20, blood pressure 110/64, saturating 92% on room air.  CHEST:  Clear to auscultation bilaterally.  ABDOMEN:  Soft, nontender.  EXTREMITIES:  No clubbing, cyanosis, or edema.  CARDIOVASCULAR:  First and second heart sound.  NEUROLOGIC:  Nonfocal.   FOLLOWUP:  The patient has been asked to see a primary care physician  here, since he usually goes to [facility] every 2 to 3  months. I have  advised the patient to see a doctor in the next 1 to 2 weeks since he  was in the hospital. I will also asked the social service team to also  help him in looking for a primary care physician.   DISCHARGE MEDICATIONS:  1. Oxycodone IR 20 mg p.o. q.6 hours p.r.n. for pain. I will give him      a prescription for 25 tablets.   DURATION OF DISCHARGE PLANNING:  Greater than 30 minutes.   SPECIAL INSTRUCTIONS:  The patient has been advised to return to the  emergency room for any recurrent or worsening of symptoms.      Ruthy Dick, MD  Electronically Signed     GU/MEDQ  D:  04/01/2008  T:  04/01/2008  Job:  [id number]

## 2010-06-18 NOTE — H&P (Signed)
[name], [name]              ACCOUNT NO.:  [id number]   MEDICAL RECORD NO.:  [id number]          PATIENT TYPE:  INP   LOCATION:  0107                         FACILITY:  [facility]   PHYSICIAN:  Thomasenia Bottoms, MDDATE OF BIRTH:  10/19/1970   DATE OF ADMISSION:  07/29/2006  DATE OF DISCHARGE:                              HISTORY & PHYSICAL   CHIEF COMPLAINT:  Pain.   HISTORY OF PRESENT ILLNESS:  Mr. Hensch is a 40 year old gentleman with  history of sickle cell disease, who presents with what he described as  his typical sickle cell crisis pain.  The pain started at about 1 a.m.  his morning.  He has not been able to get it better home with his p.o.  methadone, and the pain is increasing.  Therefore, he presented to the  hospital.  The patient also says he feels like he needs a transfusion.  He does not know the particular trigger, but feels like maybe the heat  of greater than 90 degrees has partially been to blame.  The patient's  pain is worse in his back and hips, but also extends to his extremities.  He had some in his chest.  He denies any shortness of breath or fever.   PAST MEDICAL HISTORY:  Significant for sickle cell anemia.  His last  crisis requiring hospitalization was at this hospital just 2 months ago.  He did require transfusion at that time.  His past medical history is  also significant for cholecystectomy, appendectomy and splenectomy.  He  says usually he has approximately one crisis per year, but he has been  under tremendous stress since being transferred here to [place]  with his job; has had more crises lately.   MEDICATIONS ON ARRIVAL:  Include:  folic acid or multivitamin, and then  methadone p.r.n. pain crisis.   SOCIAL HISTORY:  The patient moved here from [place] and his primary  doctor is still in [place].  He does not have one here at this time.  He  does not smoke cigarettes, drink alcohol or use any illicit drugs.   FAMILY HISTORY:   Is significant for hypertension and clearly sickle cell  trait.   VITALS ON ARRIVAL:  Blood pressure 116/63, temperature 98.7, heart rate  94, respiratory rate 16.  His O2 saturations were 86% on room air.   REVIEW OF SYSTEMS:  CONSTITUTIONAL:  Appetite fine.  No night sweats.  HEENT:  No visual disturbance.  No severe headaches.  No severe sinus  trouble.  CARDIOVASCULAR:  No significant chest pain, other than some  minor chest pain with his crisis (which is his usual).  No lower  extremity edema.  RESPIRATORY:  No shortness of breath or cough.  GI:  Has not vomited blood, seen any blood in stool; no abdominal pain or  vomiting at all.  GU: No hematuria.  MUSCULOSKELETAL:  He certainly is  having some joint pains with his crisis.  NEUROLOGIC:  No history of  seizures.  No asymmetric weakness. INTEGUMENTARY:  No open lesions or  rashes.   PHYSICAL EXAMINATION:  GENERAL:  The patient is a well-nourished, well-  developed and in no acute distress.  HEENT:  Exam is normocephalic, atraumatic.  Pupils are round and sclerae  are icteric bilaterally.  Oral mucosa moist.  NECK:  Supple; no lymphadenopathy, no thyromegaly, no jugular venous  distension.  CARDIAC EXAM:  Regular rate and rhythm.  No murmurs, gallops or rubs.  LUNGS:  Clear to auscultation bilaterally; with no wheezes, rhonchi or  rales.  ABDOMEN:  Soft, nontender, nondistended.  Normoactive bowel sounds.  No  masses are appreciated.  EXTREMITIES:  Reveal no evidence of clubbing, cyanosis or edema.  His DP  pulses are palpable.  SKIN:  Warm and dry with no open lesions or rashes  noted.  MUSCULOSKELETAL:  Examination reveals no evidence of effusion of his  joints.  NEUROLOGICALLY:  He is alert and oriented.  He is cooperative  and appropriate.  His cranial nerves II-XII are intact grossly.  He has  no slurred speech.  He has 5/5 strength in his upper and lower  extremities.  His sensory exam is intact grossly.  He has normal  muscle  tone and bulk.   DATA:  His EKG reveals normal sinus rhythm, with evidence of LVH.  He a  QTC of 481 milliseconds.  Heart  rate was 86.  White count 24.9,  hemoglobin 7.5, hematocrit 20.7, platelet count 394.  Chest x-ray  impression is cardiomegaly and probable pulmonary venous hypertension,  with no active disease.  Sodium 134, potassium 4.7, chloride 103, bicarb  25, glucose 90, BUN 10, creatinine 0.6.  ALT 52, AST 90.  The patient's  CPK is less than one, troponin less than 0.05.  Reticulocyte count is  21.6%.  Total bilirubin 6.6.   ASSESSMENT AND PLAN:  A 40 year old who presents with a sickle cell  crisis, possibly caused by the heat (per his report).  We will admit him  to the hospital; place him on IV fluids.  Will provide pain medication.  He has significant anemia.  Normally he says he does not get a  transfusion unless his hemoglobin is below 7; but he says he feels like  he needs one, so we will go ahead and transfuse him at this time.      Thomasenia Bottoms, MD  Electronically Signed     CVC/MEDQ  D:  07/29/2006  T:  07/30/2006  Job:  [id number]

## 2010-06-18 NOTE — H&P (Signed)
[name], [name]              ACCOUNT NO.:  [id number]   MEDICAL RECORD NO.:  [id number]          PATIENT TYPE:  INP   LOCATION:  1412                         FACILITY:  [facility]   PHYSICIAN:  Isidor Holts, M.D.  DATE OF BIRTH:  1971/01/18   DATE OF ADMISSION:  09/18/2007  DATE OF DISCHARGE:                              HISTORY & PHYSICAL   PRIMARY CARE PHYSICIAN:  Dr. Westley Chandler, [place].  The patient is  unassigned to [place].   CHIEF COMPLAINT:  Generalized pains, worse in the lower back and hips  for 5 days.   HISTORY OF PRESENT ILLNESS:  Essentially as above.  According to the  patient, he has been unwell since September 13, 2007, with pains which are  generalized, but particularly worse in the lower back and hips, and  consistent with his usual pain crisis.  He attempted to utilize p.r.n.  analgesics at home and maintain hydration, but this appeared to be  unsuccessful.  By September 15, 2007, he developed a dry cough and has had  temperatures ranging between 101 and 102.  He has also experienced some  shortness of breath, but denies chest pain.  He has had no abdominal  pain, vomiting or diarrhea and denies any sick contacts.  Appetite has  been poor.  Eventually, because of worsening symptoms, he came to the  emergency department.   PAST MEDICAL HISTORY:  1. Sickle cell disease, status post previous admissions for pain      crisis.  2. Status post cholecystectomy, splenectomy and appendectomy at the      age of 12 years.   MEDICATIONS HISTORY:  1. Methadone 10 mg p.o. p.r.n. b.i.d.  2. Folic acid 4 mg p.o. daily.   ALLERGIES:  No known drug allergies.   REVIEW OF SYSTEMS:  As per HPI and chief complaint; otherwise negative.   SOCIAL HISTORY:  The patient works in  He is single, nonsmoker,  nondrinker.  Has no history of drug abuse.   FAMILY HISTORY:  Mother is alive and well.  Father's health history is  unknown.   PHYSICAL EXAMINATION:  VITAL SIGNS:  Temperature  100.4, pulse 100 per  minute and regular, respiratory 20, BP 114/59, pulse oximeter 98% on  room air.  GENERAL:  The patient did not appear to be in acute distress at time of  this evaluation.  He did complain of pain.  Alert, communicative, not  short of breath at rest.  HEENT:  Moderate jaundice, moderate pallor.  Throat is clear.  Tonsils  atrophic.  Mucous membranes appear dry.  NECK:  Supple.  JVP not seen.  No palpable lymphadenopathy.  No palpable  goiter.  CHEST:  Clinically clear to auscultation.  No wheezes or crackles.  CARDIOVASCULAR:  Heart sounds S1 and S2 are heard, normal, regular.  No  murmurs.  Tachycardiac.  ABDOMEN:  Old surgical scar is noted in the upper abdomen; otherwise  soft and nontender.  No palpable organomegaly.  No palpable masses.  Normal bowel sounds.  LOWER EXTREMITIES:  No pitting edema.  Palpable peripheral pulses.  MUSCULOSKELETAL:  Unremarkable.  CENTRAL NERVOUS SYSTEM:  No focal neurologic deficit on gross  examination.   LABORATORY DATA:  CBC:  WBC 29.1, hemoglobin 7.5, hematocrit 32.  Platelets 354,000.  Electrolytes:  Sodium 143, potassium 4, chloride  110, CO2 25, BUN 32, creatinine 1.2, glucose 115.  Troponin I point of  care 0.18.  Chest x-ray dated September 18, 2007 shows cardiomegaly,  vascular congestion; stable, chronically increased lung markings.  Small  right pleural effusion.  No new focal abnormality.  A 12-lead EKG dated  September 18, 2007 shows sinus rhythm, occasional PVCs, rate 110 per  minute, left axis deviation, left ventricular hypertrophy.  No acute  ischemic changes.   ASSESSMENT AND PLAN:  1. Sickle cell disease with pain crisis.  We shall manage with      intravenous fluid hydration, opiate analgesics, folate      supplementation.   1. Anemia.  This is secondary to #1 above.  According to the patient,      his baseline hemoglobin is of the order of 9.5.  We shall,      therefore, transfuse 2 units PRBC, and follow  hemoglobin  and      hematocrit.   1. Febrile illness.  This clinically appears to be likely viral in      origin.  Flu has to be suspected.  We shall arrange H1N1 test.      Commence empiric Avelox and Tamiflu.   1. Elevated cardiac enzymes.  I doubt acute coronary syndrome.      Certainly there are no acute ischemic changes on EKG.  For      completeness, will cycle cardiac enzymes.  Start low dose Aspirin      and do a 2-D echocardiogram.   Further management will depend on clinical course.  Isidor Holts, M.D.  Electronically Signed     CO/MEDQ  D:  09/18/2007  T:  09/18/2007  Job:  [id number]   cc:   Dr. Sherald Hess [place]

## 2010-06-18 NOTE — Discharge Summary (Signed)
NAMEVIR, [name]              ACCOUNT NO.:  [id number]   MEDICAL RECORD NO.:  [id number]          PATIENT TYPE:  INP   LOCATION:  1432                         [facility]AC[facility]   PHYSICIAN:  Michaelyn Barter, M.D. DATE OF BIRTH:  02-May-1970   DATE OF ADMISSION:  10/05/2006  DATE OF DISCHARGE:  10/06/2006                               DISCHARGE SUMMARY   DISCHARGE DIAGNOSES:  1. Chest pain.  2. Sickle cell pain crisis.  3. Anemia.  4. Hypoxia.   PROCEDURE:  A two-view chest x-ray completed on October 05, 2006.   HISTORY OF PRESENT ILLNESS:  [name] is a 40 year old gentleman with  a past medical history of sickle cell disease.  He states that  approximately two days ago he developed chest pain.  This was followed  by subjective fevers.  His chest pain was described as left-sided,  sharp, occasionally throbbing in character.  He indicated that taking  deep breaths made the pain worse.  The pain was constant and there was  no radiation.  He also went on to state that the pain was typical of his  sickle cell pain crisis.  He complained of a cough which was  nonproductive and therefore decided to come to the hospital for further  evaluation.   PAST MEDICAL HISTORY:  Please see that as dictated by Dr. Michaelyn Barter.   HOSPITAL COURSE:  #1 - CHEST PAIN:  The patient was convinced that his  chest pain was secondary to his sickle cell crisis.  A chest x-ray was  done and revealed a stable examination, with no acute abnormalities.  The patient's cardiac markers were cycled.  His troponin I was found to  be 0.03, 0.01 and 0.03.  His CK-MBs were also 0.6, 0.4 and 0.5.  Therefore he ruled out for a myocardial infarction.  His  electrocardiogram was questionable.  Over the course of his  hospitalization, his chest pain resolved.   #2 - SICKLE CELL PAIN CRISIS:  The patient was provided IV Dilaudid  throughout the course of his hospitalization.  Again he indicated that  he  felt significantly better by the day of discharge.   #3 - ANEMIA:  This was felt to be secondary to the patient's sickle cell  pain crisis.  At the time of admission the patient's  hemoglobin was  noted to be 6.4, hematocrit 18.1.  The patient received 2 units of  packed RBCs.  Symptomatically he improved.   #4 - HYPOXIA:  This may have been related to the patient's severe  anemia, but again by the date of discharge, this had resolved.  By the  day of discharge, the patient indicated that he felt better.  His  temperature was 98.2 degrees, heart rate 72, respirations 18, blood  pressure 136/72, O2 saturation 97% on 2 liters.  The patient's white  blood cell count was noted to be 18.4, hemoglobin 8.2, hematocrit 23.7,  platelet count 420.   DISPOSITION:  The decision was made to discharge the patient from the  hospital.  The patient was discharged home.   DISCHARGE MEDICATIONS:  1. Vicodin 7.5/750 mg, one or two tab p.o. q.8h.  2. Avelox 400 mg, one tab p.o. daily.  The reason why the Avelox was      started was because of the subjective fevers that the patient      complained of prior to this admission.  Likewise, the patient did      have a leukocytosis.  Whether or not the leukocytosis was secondary      to his sickle cell pain crisis, was questionable.   FOLLOWUP:  The patient will be instructed to follow up with [facility]  within the next one to two weeks.  To call for a follow-up appointment.      Michaelyn Barter, M.D.  Electronically Signed     OR/MEDQ  D:  10/06/2006  T:  10/06/2006  Job:  [id number]

## 2010-06-18 NOTE — Consult Note (Signed)
[name], [name]              ACCOUNT NO.:  [id number]   MEDICAL RECORD NO.:  [id number]          PATIENT TYPE:  INP   LOCATION:  1412                         FACILITY:  [facility]   PHYSICIAN:  Noralyn Pick. Eden Emms, MD, FACCDATE OF BIRTH:  [birth year]   DATE OF CONSULTATION:  DATE OF DISCHARGE:  09/23/2007                                 CONSULTATION   REQUESTING PHYSICIAN:  Dr. Brien Few.   PRIMARY CARE PHYSICIAN:  Dolphus Jenny in [place], [place].   REASON FOR CONSULTATION:  Positive cardiac enzymes, fluid overload,  evaluate for cardiomyopathy.   HISTORY OF PRESENT ILLNESS:  This 40 year old African American male  admitted with sickle cell crisis with generalized pains and aches, who  also had complaints of febrile illness which was worrisome for a viral  illness.  The patient had generalized body aches and pains in his hip  and his back, cold-like symptoms, and came to [facility] Emergency  Room.  He is in the process of being evaluated for H1N1 flu.  He was  found to be anemic with a hemoglobin of 8.8 and was given 1 unit of  packed red blood cells and had evidence of fluid overload.  The patient  has had a history of fluid overload with blood transfusions in the past  during sickle cell crisis, which is normally relieved by Lasix; however,  he has had no relief from IV Lasix since this has occurred.  As a result  of the patient's sickle cell crisis and positive cardiac enzymes along  with fluid overload, we are asked to evaluate further.   The patient has duly followed by [facility] in [place], [place], that he has  moved here from [place].  He states that he had evidence of LVH per EKG  per Dr. Dolphus Jenny and was recommended for echocardiogram, but the patient  did not follow up and then moved to [facility] with no further cardiac  evaluation.  The patient currently is having some trouble breathing and  has noted some fluid overload and had been given IV Lasix.  However, he  has had no more  diuresed from it at this time.   REVIEW OF SYSTEMS:  Positive for fevers, chills, palpitations, racing  heart in the past, shortness of breath, and chest discomfort with  generalized achy feeling and flu-like symptoms.   PAST MEDICAL HISTORY:  Sickle cell anemia.   SOCIAL HISTORY:  He lives in [place], recently moving from [place].  He lives alone.  He is a Engineer, mining.  The patient does not smoke.  Does not drink alcohol.  He has no drug use.   FAMILY HISTORY:  The mother is in good health.  Father is unknown and he  has 1 sister, who is healthy.   CURRENT MEDICATIONS:  1. Aspirin 81 mg daily.  2. Colace 100 mg b.i.d.  3. Folic acid 1 mg daily.  4. Furosemide 40 mg IV b.i.d.  5. Heparin 5000 units subcu q.8 h.  6. Dilaudid 1 mg p.r.n.  7. Levofloxacin 500 mg daily.  8. Tamiflu 75 mg p.o. b.i.d.  9. Protonix 40 mg daily.  10.Tylenol p.r.n.  11.Zofran p.r.n.   ALLERGIES:  No known drug allergies.   CURRENT LABORATORY DATA:  Sodium 137, potassium 3.7, chloride 110, CO2  24, BUN 19, creatinine 0.92, and glucose 127.  Hemoglobin 8.8,  hematocrit 25.5, white blood cells 14.7, platelets 305, retic percent is  28.2, RBCs are 1.93.  Reticulocytes of 544.3.  RDW is  33.5.  UA is  positive for urinary tract infection.  BNP 152, troponin 0.18, 0.40, and  0.17.  EKG revealing ventricular rate of 110 beats per minute, sinus  tachycardia with left axis deviation, and mild hypertrophy noted.  Chest  x-ray reveals acute pulmonary edema, also increasing confluence of  bibasilar opacities.   PHYSICAL EXAMINATION:  VITAL SIGNS:  Blood pressure 110/54, heart rate  74, respirations 20, temperature 97.8, and O2 sat 95% on 2 L.  HEENT:  Head is normocephalic and atraumatic.  EYES:  PERRLA.  Mucous  membranes are mildly pink and moist.  Tongue is midline.  NECK:  Supple.  The patient has a runny nose and some stuffiness.  There  is no carotid bruits.  There is no JVD seen.  CARDIOVASCULAR:   Regular  rate and rhythm without murmurs, rubs, or gallops at present.  LUNGS:  Some bibasilar crackles are noted with occasional cough.  ABDOMEN:  Soft and nontender with 2+ bowel sounds.  EXTREMITIES:  Without clubbing, cyanosis, or edema.  SKIN:  Hot and dry.  NEURO:  Intact.   IMPRESSION:  1. Sickle cell anemia.  2. Fluid overload, rule out cardiomyopathy with probable pulmonary      hypertension in the setting of sickle cell anemia.  3. Rule out H1N1.  4. Positive for urinary tract infection.   PLAN:  The patient has been seen and examined by myself and Dr. Charlton Haws.  We agree with echocardiogram for LV function in the setting and  continue current medication regimen.  EKG shows LVH and may need further  cardiac testing.  If echo was abnormal, it is normal for the patient to  have pulmonary hypertension in the setting and we will evaluate for  that.  We will make further recommendations throughout hospital course  depending on hospital testing results.      Bettey Mare. Lyman Bishop, NP      Noralyn Pick. Eden Emms, MD, [facility]  Electronically Signed    KML/MEDQ  D:  09/19/2007  T:  11/09/2007  Job:  [id number]   cc:   Dr. Sherlon Handing, [place]

## 2010-06-18 NOTE — Discharge Summary (Signed)
NAMEJOSEL, KEO              ACCOUNT NO.:  [id number]   MEDICAL RECORD NO.:  [id number]          PATIENT TYPE:  INP   LOCATION:  1303                         FACILITY:  [facility]   PHYSICIAN:  Isidor Holts, M.D.  DATE OF BIRTH:  1970/12/16   DATE OF ADMISSION:  07/29/2006  DATE OF DISCHARGE:  07/31/2006                               DISCHARGE SUMMARY   PRIMARY CARE DOCTOR:  Unassigned.   DISCHARGE DIAGNOSES:  1. Sickle cell disease.  2. Pain crisis.  3. Anemia, secondary to #1 above.   DISCHARGE MEDICATIONS:  1. Methadone 10 mg p.o. p.r.n. q.8 h.  2. Folic acid 1 mg p.o. daily.  3. Ibuprofen 600 mg p.o. t.i.d. for 1 week.   PROCEDURES:  Portable chest x-ray dated July 29, 2006.  This showed  cardiomegaly with probable pulmonary venous hypertension, no frank  congestive heart failure or acute disease.   CONSULTATIONS:  None.   ADMISSION HISTORY:  Per admission H&P note of July 29, 2006, dictated by  Dr. Buena Irish.  However, in brief, this is a 40 year old male,  with known history of sickle cell disease, who presents with increasing  pain in the back, hips and extremities, consistent with his previous  vaso-occlusive pain crises.  On initial evaluation, he was found to have  a hemoglobin of 7.5 with a reticulocytosis of 21.6%.  He was admitted  for further evaluation, investigation and management.   CLINICAL COURSE:  PROBLEM #1 - SICKLE CELL DISEASE WITH PAIN CRISIS:  The patient clinically had findings consistent with vaso-occlusive pain  crisis.  He was managed with intravenous fluids, analgesics, folate  supplements and NSAIDs with satisfactory and good response.  By a.m. of  July 31, 2006, the patient was asymptomatic and very keen to be  discharged.   PROBLEM #2 - ANEMIA SECONDARY TO SICKLE CELL DISEASE:  At the time of  presentation, the patient's hemoglobin was 7.5; he was transfused of 2  units of PRBC, resulting in satisfactory bump in hemoglobin  level to 9.0  on July 31, 2006.   DISPOSITION:  The patient felt pain-free and very keen to be discharged  on a.m. of July 31, 2006.  He has therefore been discharged accordingly.   DIET:  No restrictions.   ACTIVITY:  As tolerated.   FOLLOWUP INSTRUCTIONS:  The patient has been strongly recommended to  establish a followup with a primary MD for routine and preventative  care.      Isidor Holts, M.D.  Electronically Signed     CO/MEDQ  D:  07/31/2006  T:  07/31/2006  Job:  [id number]

## 2010-06-18 NOTE — H&P (Signed)
[name], [name]              ACCOUNT NO.:  [id number]   MEDICAL RECORD NO.:  [id number]          PATIENT TYPE:  INP   LOCATION:  0101                         FACILITY:  [facility]   PHYSICIAN:  Lonia Blood, M.D.      DATE OF BIRTH:  July 08, 1970   DATE OF ADMISSION:  03/29/2008  DATE OF DISCHARGE:                              HISTORY & PHYSICAL   PRIMARY CARE PHYSICIAN:  Is Dr. Dolphus Jenny in [place], [place].   PRESENTING COMPLAINT:  Chest and back pain.   HISTORY OF PRESENT ILLNESS:  The patient is a 40 year old gentleman with  a long history of sickle cell disease who was last admitted here in  August 2009.  The patient apparently woke up in the middle of the night  today with severe chest and back pain.  The pain was persistent and  constant.  He took his home medications, but did not get any relief.  He  also felt some shortness of breath.  Denied any fever.  Denied any  cough.  Denied any nausea, vomiting or diarrhea.  The patient decided to  come to the emergency room.  He currently describes the pain as 10/10.  No relieving factors, but aggravated by movement and motion.  No nausea,  vomiting or diarrhea.   PAST MEDICAL HISTORY:  1. Mainly sickle cell disease.  2. Prior diagnosis of cardiomegaly.   ALLERGIES:  NO KNOWN DRUG ALLERGIES.   CURRENT MEDICATIONS:  Methadone, hydrocodone, acetaminophen and Ativan.   SOCIAL HISTORY:  The patient goes back and forth between [place],  [place] and [place].  He denied any tobacco, alcohol or IV drug use.   FAMILY HISTORY:  Significant for sickle cell disease and trait.   REVIEW OF SYSTEMS:  A 12-point review of systems negative except per  HPI.   PHYSICAL EXAMINATION:  VITAL SIGNS:  Temperature 98.3, blood pressure  139/75 with a pulse of 130, respiratory rate 18.  Sats 82% on room air.  GENERAL:  He is awake, alert and oriented in no acute distress.  HEENT:  PERRLA.  EOMI.  NECK:  Supple.  No JVD, no lymphadenopathy.  RESPIRATORY:  He has good air entry bilaterally.  No wheezes, no rales.  CARDIOVASCULAR:  He is tachycardiac.  ABDOMEN:  Soft, nontender.  Positive bowel sounds.  EXTREMITIES:  No edema, cyanosis or clubbing.   LABORATORY DATA:  White count 21.4, hemoglobin 7.1, platelet count 355.  Sodium is 141, potassium 4.3, chloride 105, BUN 19, creatinine 0.8,  glucose 89, calcium 1.18.  Chest x-ray showed mild cardiomegaly and  central venous pulmonary congestion and chronic perihilar air space  disease versus interstitial disease.   ASSESSMENT:  Therefore, this is a 40 year old gentleman with a long  history of sickle cell disease presenting with chest pain, tachypnea,  some mild hypoxia.  Initial sats 82% on room air.  Some leukocytosis and  significant anemia.  The differentials are likely acute sickle cell  crisis.  The triggers, however, are not known.  Based on the onset of  symptoms, as well as the patient's tachypnea and hypoxia, we will need  to rule out pulmonary embolism, as well as acute chest syndrome.  The  chest x-ray is not consistent with acute chest syndrome.   PLAN:  1. Sickle cell painful crisis.  We will admit the patient, start him      on IV pain medicine, hydration and we will follow him closely.  If      needed, we will get Dr. August Saucer to assist with his care.  In the      meantime, I will rule out PE by checking D-dimer.  If positive, we      will do a chest CT to rule out PE.  2. Sickle cell anemia.  The patient's hemoglobin has significantly      dropped.  I will type, crossmatch and transfusion with 2 units of      packed red blood cells.  3. Hypoxia.  Again, this could all be related to his sickle cell      disease.  There is no confirmed pulmonary edema, but PE is      something to be rule out.  Otherwise, further treatment will depend      on how the patient does in the hospital.  __________      Lonia Blood, M.D.  Electronically Signed     LG/MEDQ  D:   03/29/2008  T:  03/29/2008  Job:  [id number]

## 2010-06-18 NOTE — H&P (Signed)
NAMEJAHMIRE, [name] NO.:  [id number]   MEDICAL RECORD NO.:  [id number]          PATIENT TYPE:  EMS   LOCATION:  ED                           FACILITY:  [facility]   PHYSICIAN:  Michaelyn Barter, M.D. DATE OF BIRTH:  05-Nov-1970   DATE OF ADMISSION:  10/05/2006  DATE OF DISCHARGE:                              HISTORY & PHYSICAL   PRIMARY CARE PHYSICIAN:  Unassigned.   CHIEF COMPLAINT:  Chest pain.   HISTORY OF PRESENT ILLNESS:  [name] is a 40 year old gentleman with  a past medical history of sickle cell disease.  He states that  approximately two days ago, he developed chest pain.  He later went down  to develop subjective fevers.  He indicates that the pain is left-sided,  sharp and occasionally throbbing in character.  Deep breaths makes the  pain worse.  The pain is constant.  There is no radiation to the neck or  the arm.  He states that his pain is typical of sickle cell pain crisis.  It reaches approximately 8/10 in intensity.  There is no aggravating  factors.  He also complains of a cough which is nonproductive, and he  states that he has been compliant with his home medications; however, he  has derived no relief from his medications.  He denies nausea and  vomiting.  He complains of some lower back pain, and he feels slightly  short of breath.   PAST MEDICAL HISTORY:  Sickle cell pain crisis.   PAST SURGICAL HISTORY:  1. Cholecystectomy.  2. Appendectomy.  3. Splenectomy.   HOME MEDICATIONS:  1. Methadone 10 mg q.3 hours p.r.n.  2. Advil.  3. Folic acid.  4. Multivitamin.   SOCIAL HISTORY:  Cigarettes.  The patient denies alcohol.  The patient  denies alcohol.   FAMILY HISTORY:  The patient denies family history.  Mother has no  medical illnesses.  Father, no illnesses.   REVIEW OF SYSTEMS:  As per HPI.   PHYSICAL EXAMINATION:  GENERAL:  The patient is awake.  He is  cooperative.  He is in no obvious distress.  VITAL SIGNS:   Temperature 98.9, blood pressure 114/74, heart rate 105,  respirations 24, O2 sat 85% on room air.  HEENT:  Normocephalic, atraumatic, positive scleral icterus.  Extraocular movements are intact.  Pupils are sluggish to light.  Oral  mucosa is pink, no thrush, no exudates.  NECK:  No JVD, supple.  No lymphadenopathy, no thyromegaly.  CARDIAC:  S1, S2 present, tachycardic.  No murmurs, no gallops, no rubs.  RESPIRATORY:  No crackles or wheezes.  ABDOMEN:  Flat, soft, nontender, nondistended.  No hepatosplenomegaly.  Bowel sounds are present x4 quadrant.  No masses are palpated.  There is  a large well-healed horizontal scar across the patient's abdomen.  EXTREMITIES:  No leg edema.  NEUROLOGIC:  The patient is alert and oriented x3.  MUSCULOSKELETAL:  5/5 upper and lower extremity strength.   LABORATORY:  White blood cell count is 45.8, hemoglobin 6.4, hematocrit,  18.1, platelets 324.  Sodium is 142, potassium 5.1, chloride 110, CO2  28,  BUN 6, creatinine 0.67, glucose 85, calcium 8.2.   ASSESSMENT/PLAN:  1. Chest pain.  This is most likely to be associated with the      patient's sickle cell pain crisis.  Will check a chest x-ray for      now.  Will cycle the patient's cardiac markers including troponin-      I, CK-MB x3 q.8 hours apart.  Will also order an EKG.  2. Sickle cell pain crisis.  Will start the patient on IV p.r.n. pain      medication, to try to control the patient's pain level.  3. Anemia, most likely secondary to the patient's pain crisis.  Will      transfuse the patient two units of packed RBCs for now, and monitor      the patient's hemoglobin and hematocrit closely.  4. GI prophylaxis.  Will provide Protonix.  5. Hypoxia.  This may be related to the patient's current chest-      related pain crisis.  Will provide supplemental oxygen for now and      monitor.      Michaelyn Barter, M.D.  Electronically Signed     OR/MEDQ  D:  10/05/2006  T:  10/05/2006   Job:  [id number]

## 2010-06-18 NOTE — Discharge Summary (Signed)
[name], [name]              ACCOUNT NO.:  [id number]   MEDICAL RECORD NO.:  [id number]          PATIENT TYPE:  INP   LOCATION:  1433                         FACILITY:  [facility]   PHYSICIAN:  Isidor Holts, M.D.  DATE OF BIRTH:  01-12-1971   DATE OF ADMISSION:  11/28/2006  DATE OF DISCHARGE:  11/30/2006                               DISCHARGE SUMMARY   PMD:  [facility].   DISCHARGE DIAGNOSES:  1. Sickle cell disease.  2. Pain crisis.  3. Anemia. Required transfusion of 2 units packed red blood cells.  4. Urinary tract infection.   DISCHARGE MEDICATIONS:  1. Ciprofloxacin 250 mg p.o. b.i.d. for 7 days only.  2. Folic acid 1 mg p.o. daily.  3. Ibuprofen 400 mg p.o. t.i.d. with food for 1 week only.  4. MS Contin 15 mg p.o. q.12h. for 5 days only.  5. Oxycodone 5 mg p.o. p.r.n. q.4h., a total of 84 pills have been      dispensed.   PROCEDURES:  Chest x-ray dated November 28, 2006.  This shows vascular  plethora without frank edema.   CONSULTATIONS:  None.   ADMISSION HISTORY:  As in H&P notes of November 28, 2006, dictated by  Madaline Savage, MD.  However, in brief, this is a 40 year old male, with  known history of sickle cell disease, status post cholecystectomy,  status post appendectomy, status post splenectomy who presents with back  pain and chest pain which commenced on the a.m. of November 28, 2006,  without any obvious precipitants or antecedent illness.  He was admitted  further evaluation, investigation and management.   CLINICAL COURSE:  Problem 1.  SICKLE CELL DISEASE WITH PAIN CRISIS:  For details of  presentation, refer to admission history above.  The patient was managed  with intravenous fluid hydration, analgesics, NSAIDS, folic acid  supplementation with satisfactory clinical response.  By November 30, 2006, the patient graded his pains as 1-2/10 and was quite keen to be  discharged.   Problem 2.  ANEMIA:  This is secondary to sickle cell disease.   The  patient's baseline hemoglobin, according to him, is around 8.0.  At the  time of presentation his hemoglobin was 6.5.  This was addressed with  transfusion of 2 units of PRBCs resulting in a satisfactory bump of  hemoglobin to 9.0 with a hematocrit of 25.6.   Problem 3.  URINARY TRACT INFECTION:  The patient at the time of  presentation had a white cell count of 27.  He was commenced on empiric  Avelox;  however, chest x-ray was negative for active cardiopulmonary  disease.  Urinalysis, however, was positive for bacteriuria.  The  patient has therefore been placed on a 7-day course of Ciprofloxacin.  Of note, white cell count on November 30, 2006, had dropped to 16.5.  It  is possible, of course, that this may also be in part due to the  patient's sickle cell crisis.  Certainly during the course of his  hospitalization he had no episodes of pyrexia.   DISPOSITION:  The patient was very keen to be discharged  on November 30, 2006, and clinically he was considered stable and sufficiently recovered  for discharge.  He has therefore been discharged accordingly.  He may  return to regular duties on December 03, 2006.   ACTIVITY:  As tolerated.   DIET:  No restrictions.   FOLLOW-UP INSTRUCTIONS:  The patient is recommended to follow up with  his primary MD at [facility] during this week.  He has been instructed  to call for an appointment and has verbalized understanding.      Isidor Holts, M.D.  Electronically Signed     CO/MEDQ  D:  11/30/2006  T:  11/30/2006  Job:  [id number]

## 2010-06-18 NOTE — Discharge Summary (Signed)
[name], [name]              ACCOUNT NO.:  [id number]   MEDICAL RECORD NO.:  [id number]          PATIENT TYPE:  INP   LOCATION:  1422                         FACILITY:  [facility]   PHYSICIAN:  Elliot Cousin, M.D.    DATE OF BIRTH:  08/12/1970   DATE OF ADMISSION:  03/19/2007  DATE OF DISCHARGE:  03/22/2007                               DISCHARGE SUMMARY   DISCHARGE DIAGNOSES:  1. Sickle cell pain crisis, manifested as chest pain, low back pain,      and bilateral leg pain.  2. Anemia, secondary to sickle cell disease.  3. Leukocytosis, thought to be secondary to sickle cell pain crisis.   DISCHARGE MEDICATIONS:  1. Folic acid 1 mg daily.  2. Methadone 10 mg 1 tablet once to three times daily as needed.  3. Ibuprofen 600 mg every 4 hours as needed.  4. Augmentin 875 mg b.i.d. for 7 more days.   CONSULTATIONS:  None.   PROCEDURE PERFORMED:  Chest x-ray on March 19, 2007.  The results  revealed cardiomegaly and possible pulmonary edema with chronic  increased interstitial markings.   HISTORY OF PRESENT ILLNESS:  The patient is a 40 year old man with a  past medical history significant for sickle cell disease, who presented  to the emergency department on March 19, 2007, with a chief complaint  of chest pain, low back pain, and bilateral leg pain.  When the patient  was evaluated in the emergency department, his EKG revealed sinus  tachycardia with a heart rate of 101 beats per minute.  His chest x-ray  revealed cardiomegaly and chronic interstitial markings.  His initial  cardiac markers were negative.  He was afebrile and hemodynamically  stable otherwise.  His hemoglobin was 7.  The patient was admitted for  further evaluation and management.   For additional details, please see the history and physical.   HOSPITAL COURSE:  1. SICKLE CELL PAIN CRISIS WITH SICKLE CELL ANEMIA.  The patient was      started on volume repletion with normal saline.  The following day,    the IV fluids were changed to half-normal saline at 100 mL an hour.      He was typed and crossed 2 units of packed red blood cells and      transfused both units.  Following the transfusions, his hemoglobin      improved to 9.  In addition to volume repletion, the patient was      started on pain management with as-needed Dilaudid, as-needed      oxycodone, and as-needed Tylenol.  Eventually the dose of the      Dilaudid had to be increased.  The patient was also treated      symptomatically with oxygen therapy.  Over the course of the      hospitalization, the patient's pain subsided.  His chest pain      completely resolved; however, for further evaluation of the chest      pain, cardiac enzymes were ordered.  The cardiac enzymes remained      within normal limits during the hospital course.  Also the patient      did have an impressive leukocytosis with a WBC of 25 at the time of      the initial hospital assessment.  Blood cultures were ordered and      he was started on empiric antibiotic treatment with Zosyn.  His      urinalysis revealed no WBCs and his chest x-ray revealed an      increase in interstitial markings.  The patient was afebrile during      the hospital course.   Currently, the patient's pain has subsided substantially.  His white  blood cell count has improved to 14.6; therefore, the patient will be  discharged to home today on his usual medications including folic acid,  methadone, and ibuprofen.  He will be continued on antibiotic treatment  empirically for an additional 7 days with Augmentin.  The patient was  given information on the [facility] and was advised to follow up  with a primary care physician there.  He was also given information on  the sickle cell disease project which assists patients with sickle cell  disease.      Elliot Cousin, M.D.  Electronically Signed     DF/MEDQ  D:  03/22/2007  T:  03/23/2007  Job:  [id number]   cc:    Melvern Banker  Fax: [phone]

## 2010-06-18 NOTE — Consult Note (Signed)
NAMEHARON, BEILKE              ACCOUNT NO.:  [id number]   MEDICAL RECORD NO.:  [id number]          PATIENT TYPE:  INP   LOCATION:  1412                         FACILITY:  [facility]   PHYSICIAN:  Noralyn Pick. Eden Emms, MD, FACCDATE OF BIRTH:  09/01/1970   DATE OF CONSULTATION:  09/19/2007  DATE OF DISCHARGE:                                 CONSULTATION   REQUESTING PHYSICIAN:  Dr. Brien Few.   PRIMARY CARE PHYSICIAN:  Dolphus Jenny in [place], [place].   REASON FOR CONSULTATION:  Positive cardiac enzymes, fluid overload,  evaluate for cardiomyopathy.   HISTORY OF PRESENT ILLNESS:  This 40 year old African American male  admitted with sickle cell crisis with generalized pains and aches, who  also had complaints of febrile illness which was worrisome for a viral  illness.  The patient had generalized body aches and pains in his hip  and his back, cold-like symptoms, and came to [facility] Emergency  Room.  He is in the process of being evaluated for H1N1 flu.  He was  found to be anemic with a hemoglobin of 8.8 and was given 1 unit of  packed red blood cells and had evidence of fluid overload.  The patient  has had a history of fluid overload with blood transfusions in the past  during sickle cell crisis, which is normally relieved by Lasix; however,  he has had no relief from IV Lasix since this has occurred.  As a result  of the patient's sickle cell crisis and positive cardiac enzymes along  with fluid overload, we are asked to evaluate further.   The patient has duly followed by [facility] in [place], [place], that he has  moved here from [place].  He states that he had evidence of LVH per EKG  per Dr. Dolphus Jenny and was recommended for echocardiogram, but the patient  did not follow up and then moved to [facility] with no further cardiac  evaluation.  The patient currently is having some trouble breathing and  has noted some fluid overload and had been given IV Lasix.  However, he  has had no more  diuresed from it at this time.   REVIEW OF SYSTEMS:  Positive for fevers, chills, palpitations, racing  heart in the past, shortness of breath, and chest discomfort with  generalized achy feeling and flu-like symptoms.   PAST MEDICAL HISTORY:  Sickle cell anemia.   SOCIAL HISTORY:  He lives in [place], recently moving from [place].  He lives alone.  He is a Engineer, mining.  The patient does not smoke.  Does not drink alcohol.  He has no drug use.   FAMILY HISTORY:  The mother is in good health.  Father is unknown and he  has 1 sister, who is healthy.   CURRENT MEDICATIONS:  1. Aspirin 81 mg daily.  2. Colace 100 mg b.i.d.  3. Folic acid 1 mg daily.  4. Furosemide 40 mg IV b.i.d.  5. Heparin 5000 units subcu q.8 h.  6. Dilaudid 1 mg p.r.n.  7. Levofloxacin 500 mg daily.  8. Tamiflu 75 mg p.o. b.i.d.  9. Protonix 40 mg daily.  10.Tylenol p.r.n.  11.Zofran p.r.n.   ALLERGIES:  No known drug allergies.   CURRENT LABORATORY DATA:  Sodium 137, potassium 3.7, chloride 110, CO2  24, BUN 19, creatinine 0.92, and glucose 127.  Hemoglobin 8.8,  hematocrit 25.5, white blood cells 14.7, platelets 305, retic percent is  28.2, RBCs are 1.93.  Reticulocytes of 544.3.  RDW is  33.5.  UA is  positive for urinary tract infection.  BNP 152, troponin 0.18, 0.40, and  0.17.  EKG revealing ventricular rate of 110 beats per minute, sinus  tachycardia with left axis deviation, and mild hypertrophy noted.  Chest  x-ray reveals acute pulmonary edema, also increasing confluence of  bibasilar opacities.   PHYSICAL EXAMINATION:  VITAL SIGNS:  Blood pressure 110/54, heart rate  74, respirations 20, temperature 97.8, and O2 sat 95% on 2 L.  HEENT:  Head is normocephalic and atraumatic.  EYES:  PERRLA.  Mucous  membranes are mildly pink and moist.  Tongue is midline.  NECK:  Supple.  The patient has a runny nose and some stuffiness.  There  is no carotid bruits.  There is no JVD seen.  CARDIOVASCULAR:   Regular  rate and rhythm without murmurs, rubs, or gallops at present.  LUNGS:  Some bibasilar crackles are noted with occasional cough.  ABDOMEN:  Soft and nontender with 2+ bowel sounds.  EXTREMITIES:  Without clubbing, cyanosis, or edema.  SKIN:  Hot and dry.  NEURO:  Intact.   IMPRESSION:  1. Sickle cell anemia.  2. Fluid overload, rule out cardiomyopathy with probable pulmonary      hypertension in the setting of sickle cell anemia.  3. Rule out H1N1.  4. Positive for urinary tract infection.   PLAN:  The patient has been seen and examined by myself and Dr. Charlton Haws.  We agree with echocardiogram for LV function in the setting and  continue current medication regimen.  EKG shows LVH and may need further  cardiac testing.  If echo was abnormal, it is normal for the patient to  have pulmonary hypertension in the setting and we will evaluate for  that.  We will make further recommendations throughout hospital course  depending on hospital testing results.      Bettey Mare. Lyman Bishop, NP      ______________________________  Noralyn Pick Eden Emms, MD, [facility]    KML/MEDQ  D:  09/19/2007  T:  09/20/2007  Job:  [id number]   cc:   Dr. Sherlon Handing, [place]

## 2010-06-18 NOTE — Discharge Summary (Signed)
NAMEKELYN, KOSKELA              ACCOUNT NO.:  [id number]   MEDICAL RECORD NO.:  [id number]          PATIENT TYPE:  INP   LOCATION:  1412                         FACILITY:  [facility]   PHYSICIAN:  Hind I Elsaid, MD      DATE OF BIRTH:  Jun 12, 1970   DATE OF ADMISSION:  09/18/2007  DATE OF DISCHARGE:  09/23/2007                               DISCHARGE SUMMARY   PRIMARY CARE PHYSICIAN:  The patient will see Dr. Elmyra Ricks as an  outpatient.  The patient needs to make the call.   DISCHARGE DIAGNOSES:  1. Sickle cell painful crisis.  2. Anemia secondary to sickle cell.  3. Elevated troponin.  4. Abnormal electrocardiogram.  5. Fluid overload.  6. Normal 2-dimensional echocardiogram.   DISCHARGE MEDICATIONS:  1. Aspirin 81 mg daily.  2. Lasix 20 mg daily for one week.  3. Folic acid 1 mg daily.  4. Ibuprofen 600 mg every 6 hours p.r.n.  5. Percocet 5/325 mg every four hours as needed.   PROCEDURE:  Chest x-ray:  Cardiomegaly with vascular congestion and  stable chronically increased lung markings, no new focal abnormality.  Small right pleural effusion.  Chest x-ray:  Acute pulmonary edema, progressive atelectasis or  infection.  Chest x-ray:  Improvement in vascular congestion which is minimal.  Small right pleural effusion.  Minimal atelectasis of the right base but  no evidence for pneumonia.  2D echocardiogram:  Ejection fraction 65%.  No regional wall motion  abnormalities.  Left ventricular wall thickening was mildly increased.  Left atrium mildly dilated.  Estimated peak systolic pressure mildly to  moderately increased.  Right atrium was mildly dilated.   CONSULTATIONS:  Cardiology consult done by Dr. Charlton Haws.   HISTORY OF PRESENT ILLNESS:  1. This is a 40 year old gentleman who had a history of sickle cell      painful crisis, admitted to the hospital with lower back pain and      hip consistent with his usual pain crisis.  Also the patient      admitted for  temperature of 102 and some shortness of breath.  The      patient came to the hospital for evaluation.  The patient was found      to in on sickle cell painful crisis.  The patient was started on IV      fluids and analgesics and folic supplement.  2. Anemia of sickle cell.  The patient found to have hemoglobin of      7.5.  After blood transfusion his hemoglobin has responded at 10.5.  3. Febrile illness.  The patient kept on airborne isolation.      __________ negative.  The patient was placed empirically on Avelox      and Tamiflu which were stopped after chest x-ray and __________ 1      in 1 were negative.  4. Elevated cardiac enzymes.  The patient was placed on aspirin.      Coagulase was held secondary to fluid overload, elevated cardiac      enzymes.  Cardiology recommended continuing aspirin and Lasix.  We      felt that fluid overload was probably secondary to the blood      transfusion the patient received and IV fluids during the ER visit.      We have to be careful with __________  patient __________which he      receives.  The patient's symptoms improved with Lasix and 2D echo      did not show evidence of cardiomyopathy.  Ejection fraction within      normal.  The patient will be discharged on a small dose of Lasix      for one week and aspirin.  The patient needs to follow closely with      his primary care physician.  We will assign the patient to Dr.      Elmyra Ricks to see as an outpatient within one week.  Patient      informed.  5. Febrile episodes.  The patient has no evidence of fever and no      white blood cells during hospitalization.   We felt that the patient was medically stable to be discharged home.      Hind Bosie Helper, MD  Electronically Signed     HIE/MEDQ  D:  09/23/2007  T:  09/23/2007  Job:  [id number]

## 2010-06-18 NOTE — H&P (Signed)
NAMEMETRO, EDENFIELD              ACCOUNT NO.:  [id number]   MEDICAL RECORD NO.:  [id number]          PATIENT TYPE:  INP   LOCATION:  1422                         [facility]ACILITY[facility]  [facility]   PHYSICIAN:  Della Goo, M.D. DATE OF BIRTH:  [birth year]   DATE OF ADMISSION:  03/19/2007  DATE OF DISCHARGE:  03/22/2007                              HISTORY & PHYSICAL   PRIMARY CARE PHYSICIAN:  This is an unassigned patient.   CHIEF COMPLAINT:  Increased pain.   HISTORY OF PRESENT ILLNESS:  This is a 40 year old male with history of  sickle cell disease presenting to the emergency department with  complaints of severe diffuse pain which started in his back at about  4:00 p.m. today.  He also reports the pain has progressed to the areas  of both hips and then into the chest associated with shortness of  breath.  The patient also reports beginning to have fevers and chills.  He denies having any nausea, vomiting, diarrhea.   PAST MEDICAL HISTORY:  History of sickle cell disease.   PAST SURGICAL HISTORY:  1. Splenectomy.  2. Appendectomy.  3. Cholecystectomy.   MEDICATIONS:  Include methadone p.r.n., Percocet p.r.n., and folic acid  1 mg one p.o. daily.   ALLERGIES:  No known drug allergies.   SOCIAL HISTORY:  The patient is a nonsmoker, nondrinker.  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   FAMILY HISTORY:  No coronary artery disease, diabetes mellitus,  hypertension or cancer in his family that he knows of.   REVIEW OF SYSTEMS:  Pertinent for mentioned above.   PHYSICAL EXAMINATION:  GENERAL:  This is a 40 year old, well-nourished,  well-developed male in discomfort but no acute distress.  VITAL SIGNS:  Temperature 99.2, blood pressure 139/72, heart rate 96,  respirations 20, O2 saturation 93% on room air.  HEENT:  On examination, normocephalic, atraumatic.  Pupils equally  round, reactive to light.  Extraocular muscles are intact. Funduscopic  benign. Oropharynx is clear.  NECK:   Supple, full range of motion.  No thyromegaly, adenopathy,  jugular venous distention.  CARDIOVASCULAR:  Regular rate and rhythm.  No murmurs, gallops or rubs.  LUNGS:  Clear to auscultation bilaterally.  ABDOMEN:  Positive bowel sounds, soft, nontender, nondistended.  EXTREMITIES:  Without cyanosis, clubbing or edema.  NEUROLOGIC:  Examination nonfocal.   LABORATORY STUDIES:  White blood cell count 25.0, hemoglobin 7.0,  hematocrit 18.5, platelets 374, neutrophils 66%.  Chemistry with a  sodium of 139, potassium 4.0, chloride 107, bicarb 27, BUN 13,  creatinine 0.9 and glucose 117.  Urinalysis positive for hemoglobin,  otherwise negative.   ASSESSMENT:  A 40 year old male being admitted with  1. Sickle cell crisis.  2. Chest pain.  3. Anemia.  4. Fever.  5. Leukocytosis.  This could possibly be a stress leukocytosis      secondary to pain.   PLAN:  The patient will be admitted to telemetry area for cardiac  monitoring.  Cardiac enzymes will be performed.  The patient will be  transfused 2 units of packed red blood cells, and IV fluids have been  ordered for rehydration therapy.  DVT and GI prophylaxes have been  ordered as well.  Blood cultures have been sent up, and the patient has  been placed empirically on Zosyn therapy.  Of note, he has had a  splenectomy, and cautioned must be provided for encapsulated bacteria.      Della Goo, M.D.  Electronically Signed     HJ/MEDQ  D:  03/20/2007  T:  03/22/2007  Job:  [id number]

## 2010-06-18 NOTE — H&P (Signed)
NAME:  [name], [name]              ACCOUNT NO.:  [id number]   MEDICAL RECORD NO.:  [id number]          PATIENT TYPE:  EMS   LOCATION:  ED                           [facility]AC[facility]   PHYSICIAN:  Madaline Savage, MD        DATE OF BIRTH:  [birth year]   DATE OF ADMISSION:  11/28/2006  DATE OF DISCHARGE:                              HISTORY & PHYSICAL   PRIORITY ADMISSION HISTORY AND PHYSICAL   PRIMARY CARE PHYSICIAN:  [facility]; this patient is unassigned to [place].   CHIEF COMPLAINT:  Back pain and chest pain.   HISTORY OF PRESENT ILLNESS:  [name] is a 40 year old gentleman with  a history of sickle cell disease, who comes in with back pain and chest  pain.  He states the pain started this morning, it started in his upper  back and now it has spread to his lower back and his chest.  He  describes the pain as a throbbing in character, there is no radiation of  the pain, there are no aggravating or relieving factors.  He denies  having any fever or chills or any bleeding difficulty or any cough at  this time.   PAST MEDICAL HISTORY:  Sickle cell disease.   PAST SURGICAL HISTORY:  1. Cholecystectomy.  2. Appendectomy.  3. Splenectomy.   ALLERGIES:  No known drug allergies.   CURRENT MEDICATIONS:  Percocet as needed.   SOCIAL HISTORY:  He denies any history of smoking, alcohol, or drug  abuse.   FAMILY HISTORY:  He states both his parents are in their 82s and they  are healthy.   REVIEW OF SYSTEMS:  GENERAL:  He denies any recent weight loss or weight  gain.  HEENT:  No headaches, no blurred vision, and no sore throat.  CARDIOVASCULAR SYSTEM:  He does complain of chest pain, which is all  over the chest, no palpitations.  RESPIRATORY SYSTEM:  Denies shortness  of breath or cough.  GI SYSTEM:  No abdominal pain, nausea, vomiting,  diarrhea, or constipation.   PHYSICAL EXAMINATION:  GENERAL:  He is alert and oriented x3.  VITAL SIGNS:  Temperature is 98.6, pulse rate of 91,  blood pressure  122/58, oxygen saturation 98% on 2 liters.  HEENT:  Atraumatic, normocephalic, pupils bilaterally equal and react to  light, mucous membranes moist.  NECK:  Supple, no JVD, no carotid bruit.  CARDIOVASCULAR:  S1 and S2 heard, regular rhythm, no murmurs, rubs, or  gallops.  CHEST:  Clear to auscultation.  ABDOMEN:  Soft, bowel sounds heard.  EXTREMITIES:  No edema, cyanosis, or clubbing.   LABORATORY DATA:  White count of 27, hemoglobin 6.5, lipase is 377,  retic count is 15.7, sodium 137, potassium 4, chloride 108, bicarb 26,  BUN 10, creatinine of 0.55, glucose 96, calcium 8.2.  X-ray chest shows  plethora without frank edema.   IMPRESSION:  1. Acute vasoocclusive sickle cell crisis.  2. Sickle cell anemia.  3. Leukocytosis.   PLAN:  This is a 40 year old gentleman, who comes in with a  vasoocclusive crisis.  I will  admit him to the floor.  I will start him  with IV fluids and oxygen.  His hemoglobin is 6.5.  I will transfuse two  units of packed cells on him.  He denies having any fever or chills.  His x-ray chest does not show any infiltrates, but he does have an  elevated white count.  I will start him on Avelox empirically as he is  an post splenectomy patient and has a higher risk of life threatening  infections.  He did complain of chest pain, which is most likely  secondary to his sickle cell crisis.  I will cardiac markers and EKG on  him.  I will also obtain blood cultures and a urinalysis to look for a  source of infection.  I will put him on DVT prophylaxis.      Madaline Savage, MD  Electronically Signed     PKN/MEDQ  D:  11/28/2006  T:  11/28/2006  Job:  [id number]

## 2010-06-21 NOTE — H&P (Signed)
[name], [name]              ACCOUNT NO.:  [id number]   MEDICAL RECORD NO.:  [id number]          PATIENT TYPE:  INP   LOCATION:  0101                         FACILITY:  [facility]   PHYSICIAN:  Altha Harm, MDDATE OF BIRTH:  18-Aug-1970   DATE OF ADMISSION:  05/09/2006  DATE OF DISCHARGE:                              HISTORY & PHYSICAL   CHIEF COMPLAINT:  Pain.   HISTORY OF PRESENT ILLNESS:  This is a 40 year old gentleman with  hemoglobin SS who presents to the emergency room for the second time in  a week with complaints of pain.  According to the patient, he really has  hospitalizations for pain, however, he has been having pain ongoing for  approximately 3 weeks.  He states that the pain is 6/10 at its maximum  and is located in the back, extremities and chest wall.  The patient  states that the pain is typical of his pain associated with sickle cell.  It is aching and occasionally sharp in nature, nonradiating and not  associated with any other symptoms.  The patient states that he has had  no fevers or chills, no nausea, vomiting or diarrhea until this morning  when he had some diarrhea.  The patient states that he is usually  hospitalized approximately two to three times per year requiring  transfusions with hospitalization and he has two to three ER visits per  year.  His usual hemoglobin is about 9.0.   The patient states that his pain becomes severe when his hemoglobin gets  down to about 7.5.  The patient has had two ICU admissions in the past.  One for an acute chest syndrome which required intubation and the other  one when he had splenic sequestration requiring a splenectomy, and  appendectomy and cholecystectomy at that time.  The patient states that  he usually uses methadone 10 mg p.o. t.i.d. for pain  which controls his  pain relatively well.  The patient has no other complaints at this time.   PAST MEDICAL HISTORY:  Sickle cell hemoglobin SS.   PAST  SURGICAL HISTORY:  1. Splenectomy.  2. Appendectomy.  3. Cholecystectomy.   FAMILY HISTORY:  Significant for hypertension in a grandmother.   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  He denies any smoking, alcohol or drug use.  He resides with  his wife and children.   ALLERGIES:  No known drug allergies.   CURRENT MEDICATIONS:  1. Folate 1 mg p.o. daily.  2. Oxycodone for breakthrough pain that she received from the      emergency room here 1 week ago.   REVIEW OF SYSTEMS:  Fourteen systems were reviewed except as noted in  the HPI.  All systems are negative.   EMERGENCY DEPARTMENT COURSE:  Upon arrival to the emergency room, the  patient had a temperature of 98.6, blood pressure 122/61, heart rate 87,  respirations 20.  Laboratory studies performed in the emergency room  shows hemoglobin of 7.4, white blood cell count 18, platelets 380.  The  patient had reticulocyte count  of greater than 25% which denotes  vigorous bone marrow activity.  Sodium is 136, potassium 4.1, chloride  104, bicarb 26, BUN 5, creatinine 0.55.  Please note that a review of  laboratory studies approximately 1 week ago on March 30, shows that the  patient had a reticulocyte count of 18 and hemoglobin low at 7.  At that  time, the patient also had a white blood cell count of 23.2 with no  signs and symptoms of infection.  The patient also had a chest x-ray  performed which shows stable bronchial and pulmonary vascular prominence  without focal infiltrates.   PHYSICAL EXAMINATION:  GENERAL:  The patient is resting in bed.  There  is only mild distress secondary to pain.  The patient appears to be very  educated about his disease and is hoping that he can have a quick  hospitalization so he can get home and get back to work.  VITAL SIGNS:  Blood pressure 123/57, temperature 97.9, heart rate 86,  respirations 16.  The patient has 2 L of oxygen on and his O2   saturations are 98%.  HEENT:  The patient is normocephalic, atraumatic.  He has mild icterus.  There is some conjunctival pallor noted.  His pupils equal round and  reactive to light and accommodation.  Fundi are benign.  Extraocular  movements intact.  Tympanic membranes are noted bilaterally with good  landmarks.  Oral mucosa is somewhat tacky.  There are no exudates,  erythema or lesions noted.  Nasal mucosa shows no polyps.  NECK:  Trachea is midline.  There are no masses, no thyromegaly noted.  No JVD or carotid bruit.  LUNGS:  The patient has a normal respiratory effort.  Equal excursions  bilaterally.  He has no rales or rhonchi noted on examination.  He has  no increased fremitus or any tactile fremitus noted.  CARDIAC:  Normal S1, S2.  He has a 1/6 systolic ejection murmur noted.  There are no heaves or thrills on palpation.  His PMI is nondisplaced.  ABDOMEN:  The patient has a healed, right upper quadrant scar.  He has  normoactive bowel sounds.  Soft, nontender, nondistended.  No masses, no  hepatosplenomegaly noted.  LYMPH NODES:  No cervical, axillary or lymphadenopathy.  NEUROLOGIC:  The patient has no focal neurological deficits.  Cranial  nerves 2-12 grossly intact.  DTRs 2+ bilaterally in upper and lower  extremities.  Sensation is intact to light touch and proprioception.  Strength is 5/5 in bilateral upper and lower extremities.  MUSCULOSKELETAL:  The patient does have joint tenderness throughout,  however, there is no warmth, erythema or swelling noted.  He does not  have any spinal tenderness.  He does have some chest wall tenderness on  palpation.  PSYCHIATRIC:  The patient is alert and oriented x3.  He has normal  cognition and insight and very good recent and remote recall.   ASSESSMENT/PLAN:  1. This patient presents with sickle cell vaso-occlusive episode.  The      patient also has a relative anemia in relationship to his normal     hemoglobins.  At this  time, the patient is going to be admitted and      he will be started on aggressive hydration to assist with his vaso-      occlusive crisis.  The patient will be started on methadone for      pain control and Dilaudid for breakthrough pain.  Please note that  the patient reports that methadone usually works best for him as a      long-acting pain controller.  I will go ahead and start the patient      on methadone.  The patient will also be replaced on his folate 1 mg      daily.  2. Anemia with vigorous bone marrow activity.  The patient has      evidently increased his hemoglobin from 7 to 7.4, however, I think      that most of this is secondary to hemoconcentration in this patient      who his having a vaso-occlusive episode.  The patient's usual      hemoglobin is 8.5 to 9 and the patient will be transfused 2 units      of packed red blood cells to improve his pain and oxygenation.      Please note that the patient has had an acute chest syndrome in the      past and the patient should be monitored closely for his O2      saturations.  At this time, the patient will be continued on oxygen      to maintain his O2 saturations above 98%.  The patient shows no      signs and symptoms of infection at this time, however, if he      develops a fever, blood cultures should be drawn and antibiotics      started immediately for this immunocompromised patient.  The      patient will be admitted to      [facility] Team C, however, I will speak with Dr. August Saucer, the director      of the sickle cell program, about assisting or assuming care for      this patient while hospitalized. I except the patient will be      hospitalized for approximately 48 hours and then can be discharged.      Altha Harm, MD  Electronically Signed     MAM/MEDQ  D:  05/09/2006  T:  05/09/2006  Job:  [id number]

## 2010-06-21 NOTE — Discharge Summary (Signed)
[name], [name]              ACCOUNT NO.:  [id number]   MEDICAL RECORD NO.:  [id number]          PATIENT TYPE:  INP   LOCATION:  1607                         FACILITY:  [facility]   PHYSICIAN:  Beckey Rutter, MD  DATE OF BIRTH:  1970/03/06   DATE OF ADMISSION:  05/09/2006  DATE OF DISCHARGE:  05/10/2006                               DISCHARGE SUMMARY   PRIMARY CARE PHYSICIAN:  Unassigned.   CHIEF COMPLAINT:  Pain.   HISTORY OF PRESENT ILLNESS:  This is a 40 year old gentleman who came  with pain crisis. For full H&P, please refer to the H&P dictated by Dr.  Ashley Royalty on the admission date.   DISCHARGE MEDICATIONS:  1. Oxycontin 15 mg p.o. b.i.d.  2. Percocet 10/325 mg 1 tab q.6 hours p.r.n. pain.  3. Folic acid supplementation.   DISCHARGE DIAGNOSES:  1. Sickle cell pain crisis.  2. Anemia with low hemoglobin and hematocrit.   HISTORY OF PRESENT ILLNESS:  [name] is a 40 year old with pain  crisis, who came with pain and anemia.   HOSPITAL COURSE:  Significant for blood transfusion of 2 units packed  RBC's on the day of admission with also traumatic response to the pain  control. He was given methadone here for the pain control, as well as  Dilaudid. He will be discharged with the medication as explained above  and the importance of having a primary care physician in the area, or a  sickle cell specialist in the area was expressed on the discharge to  him.      Beckey Rutter, MD  Electronically Signed     EME/MEDQ  D:  05/10/2006  T:  05/10/2006  Job:  [id number]

## 2010-10-25 LAB — COMPREHENSIVE METABOLIC PANEL
BUN: 10
CO2: 26
Calcium: 8.3 — ABNORMAL LOW
Chloride: 107
Creatinine, Ser: 0.66
GFR calc non Af Amer: >60
Glucose, Bld: 88
Total Bilirubin: 4.7 — ABNORMAL HIGH

## 2010-10-25 LAB — RETICULOCYTES: Retic Ct Pct: 24.6 — ABNORMAL HIGH

## 2010-10-25 LAB — CK TOTAL AND CKMB (NOT AT ARMC)
CK, MB: 0.4
Relative Index: INVALID
Relative Index: INVALID
Total CK: 29
Total CK: 33

## 2010-10-25 LAB — CBC
HCT: 24.9 — ABNORMAL LOW
Hemoglobin: 7 — CL
Hemoglobin: 9 — ABNORMAL LOW
MCHC: 37.9 — ABNORMAL HIGH
MCHC: 36.2 — ABNORMAL HIGH
MCV: 96.7
MCV: 97.8
RBC: 2.55 — ABNORMAL LOW
RDW: 34 — ABNORMAL HIGH
WBC: 14.6 — ABNORMAL HIGH

## 2010-10-25 LAB — CULTURE, BLOOD (ROUTINE X 2)
Culture: NO GROWTH
Culture: NO GROWTH

## 2010-10-25 LAB — DIFFERENTIAL
Basophils Relative: 0
Eosinophils Absolute: 0.8 — ABNORMAL HIGH
Lymphs Abs: 6 — ABNORMAL HIGH
Monocytes Absolute: 1.8 — ABNORMAL HIGH
Neutro Abs: 16.4 — ABNORMAL HIGH
Neutrophils Relative %: 66

## 2010-10-25 LAB — URINALYSIS, ROUTINE W REFLEX MICROSCOPIC
Glucose, UA: NEGATIVE
Specific Gravity, Urine: 1.013
pH: 6

## 2010-10-25 LAB — CROSSMATCH: ABO/RH(D): A POS

## 2010-10-25 LAB — BASIC METABOLIC PANEL
CO2: 27
Calcium: 8.3 — ABNORMAL LOW
Glucose, Bld: 117 — ABNORMAL HIGH
Sodium: 139

## 2010-10-25 LAB — HEMOGLOBIN AND HEMATOCRIT, BLOOD
HCT: 24.5 — ABNORMAL LOW
Hemoglobin: 8.7 — ABNORMAL LOW

## 2010-10-25 LAB — URINE MICROSCOPIC-ADD ON

## 2010-11-13 LAB — CBC
HCT: 18.2 — ABNORMAL LOW
Hemoglobin: 8.5 — ABNORMAL LOW
MCHC: 35.4
MCHC: 36.1 — ABNORMAL HIGH
MCV: 92.8
MCV: 94.6
MCV: 97.8
Platelets: 377
Platelets: 379
RBC: 1.86 — ABNORMAL LOW
RBC: 2.55 — ABNORMAL LOW
RDW: 21.7 — ABNORMAL HIGH
RDW: 22.5 — ABNORMAL HIGH
WBC: 20.7 — ABNORMAL HIGH

## 2010-11-13 LAB — CK TOTAL AND CKMB (NOT AT ARMC)
CK, MB: 0.3
Relative Index: INVALID
Total CK: 33

## 2010-11-13 LAB — BASIC METABOLIC PANEL
Chloride: 108
Chloride: 110
GFR calc Af Amer: >60
GFR calc non Af Amer: >60
GFR calc non Af Amer: >60
Glucose, Bld: 88
Potassium: 4
Potassium: 4.1
Sodium: 140

## 2010-11-13 LAB — CROSSMATCH
ABO/RH(D): A POS
Antibody Screen: NEGATIVE

## 2010-11-13 LAB — URINALYSIS, ROUTINE W REFLEX MICROSCOPIC
Bilirubin Urine: NEGATIVE
Ketones, ur: NEGATIVE
Nitrite: NEGATIVE
Protein, ur: 30 — AB
Specific Gravity, Urine: 1.012
Urobilinogen, UA: 2 — ABNORMAL HIGH

## 2010-11-13 LAB — URINE MICROSCOPIC-ADD ON

## 2010-11-13 LAB — RETICULOCYTES
RBC.: 1.9 — ABNORMAL LOW
Retic Count, Absolute: 298.3 — ABNORMAL HIGH
Retic Ct Pct: 15.7 — ABNORMAL HIGH

## 2010-11-13 LAB — CARDIAC PANEL(CRET KIN+CKTOT+MB+TROPI): Total CK: 26

## 2010-11-13 LAB — POCT CARDIAC MARKERS: Myoglobin, poc: 20.3

## 2010-11-13 LAB — DIFFERENTIAL
Basophils Absolute: 0.2 — ABNORMAL HIGH
Basophils Relative: 1
Eosinophils Absolute: 0.6
Lymphocytes Relative: 32
Monocytes Relative: 10
Neutro Abs: 11.2 — ABNORMAL HIGH
Neutrophils Relative %: 54

## 2010-11-13 LAB — FERRITIN: Ferritin: 4967 — ABNORMAL HIGH (ref 22–322)

## 2010-11-13 LAB — TROPONIN I: Troponin I: <0.01

## 2010-11-15 LAB — CBC
HCT: 23.7 — ABNORMAL LOW
Hemoglobin: 8.2 — ABNORMAL LOW
MCHC: 34.8
MCV: 102.5 — ABNORMAL HIGH
MCV: 98.9
Platelets: 324
RBC: 1.76 — ABNORMAL LOW
RBC: 2.39 — ABNORMAL LOW
WBC: 45.8 — ABNORMAL HIGH

## 2010-11-15 LAB — BASIC METABOLIC PANEL
BUN: 6
CO2: 26
Calcium: 8.2 — ABNORMAL LOW
Calcium: 8.6
Creatinine, Ser: 0.67
Creatinine, Ser: 0.75
GFR calc Af Amer: >60
GFR calc Af Amer: >60
GFR calc non Af Amer: >60
Glucose, Bld: 99

## 2010-11-15 LAB — CROSSMATCH

## 2010-11-15 LAB — RETICULOCYTES
Retic Count, Absolute: 529.8 — ABNORMAL HIGH
Retic Ct Pct: 30.1 — ABNORMAL HIGH

## 2010-11-15 LAB — DIFFERENTIAL
Basophils Absolute: 0
Eosinophils Absolute: 0.9 — ABNORMAL HIGH
Lymphocytes Relative: 23
Monocytes Relative: 5
Neutro Abs: 32.1 — ABNORMAL HIGH
Neutrophils Relative %: 70

## 2010-11-15 LAB — CARDIAC PANEL(CRET KIN+CKTOT+MB+TROPI)
Relative Index: INVALID
Troponin I: 0.01

## 2010-11-15 LAB — CK TOTAL AND CKMB (NOT AT ARMC)
CK, MB: 0.6
Total CK: 36

## 2010-11-20 LAB — COMPREHENSIVE METABOLIC PANEL
Alkaline Phosphatase: 96
BUN: 10
Calcium: 8.4
Glucose, Bld: 90
Potassium: 4.7
Total Protein: 7.1

## 2010-11-20 LAB — DIFFERENTIAL
Basophils Absolute: 0.1
Basophils Absolute: 0.1
Basophils Relative: 0
Basophils Relative: 1
Eosinophils Absolute: 0.6
Eosinophils Absolute: 0.7
Eosinophils Relative: 2
Eosinophils Relative: 5
Lymphocytes Relative: 34
Lymphocytes Relative: 34
Lymphs Abs: 4 — ABNORMAL HIGH
Lymphs Abs: 4.5 — ABNORMAL HIGH
Monocytes Absolute: 0.9 — ABNORMAL HIGH
Monocytes Absolute: 1.5 — ABNORMAL HIGH
Monocytes Absolute: 3.5 — ABNORMAL HIGH
Monocytes Relative: 11
Neutro Abs: 6.2
Neutro Abs: 6.5
Neutrophils Relative %: 50
Neutrophils Relative %: 59

## 2010-11-20 LAB — CBC
HCT: 20.7 — ABNORMAL LOW
HCT: 24.4 — ABNORMAL LOW
HCT: 25.4 — ABNORMAL LOW
Hemoglobin: 7.5 — CL
Hemoglobin: 8.6 — ABNORMAL LOW
MCHC: 35.3
MCHC: 35.5
MCHC: 36
MCV: 94.5
MCV: 96.5
Platelets: 397
Platelets: 409 — ABNORMAL HIGH
RBC: 2.53 — ABNORMAL LOW
RDW: 23 — ABNORMAL HIGH
RDW: 24.8 — ABNORMAL HIGH
RDW: 28.1 — ABNORMAL HIGH
WBC: 13.3 — ABNORMAL HIGH

## 2010-11-20 LAB — BASIC METABOLIC PANEL
BUN: 8
BUN: 8
CO2: 27
CO2: 27
Calcium: 8.3 — ABNORMAL LOW
Chloride: 106
Creatinine, Ser: 0.5
GFR calc Af Amer: >60
GFR calc non Af Amer: >60
GFR calc non Af Amer: >60
Glucose, Bld: 92
Glucose, Bld: 92
Potassium: 4.2
Potassium: 4.7
Sodium: 138

## 2010-11-20 LAB — CROSSMATCH: Antibody Screen: NEGATIVE

## 2010-11-20 LAB — RETICULOCYTES
RBC.: 2.12 — ABNORMAL LOW
Retic Ct Pct: 21.6 — ABNORMAL HIGH

## 2010-11-20 LAB — POCT CARDIAC MARKERS: Myoglobin, poc: 25.4

## 2010-11-27 ENCOUNTER — Inpatient Hospital Stay (HOSPITAL_COMMUNITY)
Admission: EM | Admit: 2010-11-27 | Discharge: 2010-12-02 | DRG: 574 | Disposition: A | Discharge status: Home / Self Care | Payer: BC Managed Care – PPO | Attending: Internal Medicine | Admitting: Internal Medicine

## 2010-11-27 DIAGNOSIS — M25559 Pain in unspecified hip: Secondary | ICD-10-CM | POA: Diagnosis present

## 2010-11-27 DIAGNOSIS — M79609 Pain in unspecified limb: Secondary | ICD-10-CM | POA: Diagnosis present

## 2010-11-27 DIAGNOSIS — J189 Pneumonia, unspecified organism: Secondary | ICD-10-CM | POA: Diagnosis present

## 2010-11-27 DIAGNOSIS — D72829 Elevated white blood cell count, unspecified: Secondary | ICD-10-CM | POA: Diagnosis present

## 2010-11-27 DIAGNOSIS — N179 Acute kidney failure, unspecified: Secondary | ICD-10-CM | POA: Diagnosis present

## 2010-11-27 DIAGNOSIS — D5700 Hb-SS disease with crisis, unspecified: Principal | ICD-10-CM | POA: Diagnosis present

## 2010-11-27 DIAGNOSIS — I499 Cardiac arrhythmia, unspecified: Secondary | ICD-10-CM | POA: Diagnosis present

## 2010-11-27 DIAGNOSIS — M109 Gout, unspecified: Secondary | ICD-10-CM | POA: Diagnosis present

## 2010-11-27 LAB — DIFFERENTIAL
Band Neutrophils: 0 % (ref 0–10)
Basophils Absolute: 0 10*3/uL (ref 0.0–0.1)
Basophils Relative: 0 % (ref 0–1)
Blasts: 0 %
Lymphocytes Relative: 34 % (ref 12–46)
Metamyelocytes Relative: 0 %
Myelocytes: 0 %
Promyelocytes Absolute: 0 %

## 2010-11-27 LAB — BASIC METABOLIC PANEL
Calcium: 8.4 mg/dL (ref 8.4–10.5)
GFR calc Af Amer: 54 mL/min — ABNORMAL LOW (ref >90)
GFR calc non Af Amer: 47 mL/min — ABNORMAL LOW (ref >90)
Glucose, Bld: 129 mg/dL — ABNORMAL HIGH (ref 70–99)
Potassium: 4.5 mEq/L (ref 3.5–5.1)
Sodium: 139 mEq/L (ref 135–145)

## 2010-11-27 LAB — CBC
Hemoglobin: 6.2 g/dL — CL (ref 13.0–17.0)
MCH: 34.8 pg — ABNORMAL HIGH (ref 26.0–34.0)
Platelets: 303 10*3/uL (ref 150–400)
RBC: 1.78 MIL/uL — ABNORMAL LOW (ref 4.22–5.81)
WBC: 23.6 10*3/uL — ABNORMAL HIGH (ref 4.0–10.5)

## 2010-11-27 LAB — RETICULOCYTES
RBC.: 1.78 MIL/uL — ABNORMAL LOW (ref 4.22–5.81)
Retic Ct Pct: 20.5 % — ABNORMAL HIGH (ref 0.4–3.1)

## 2010-11-28 ENCOUNTER — Emergency Department (HOSPITAL_COMMUNITY): Payer: BC Managed Care – PPO

## 2010-11-28 LAB — BASIC METABOLIC PANEL
CO2: 22 mEq/L (ref 19–32)
Chloride: 111 mEq/L (ref 96–112)
GFR calc non Af Amer: 69 mL/min — ABNORMAL LOW (ref >90)
Glucose, Bld: 105 mg/dL — ABNORMAL HIGH (ref 70–99)
Potassium: 4.5 mEq/L (ref 3.5–5.1)
Sodium: 140 mEq/L (ref 135–145)

## 2010-11-28 LAB — IRON AND TIBC
Saturation Ratios: 48 % (ref 20–55)
TIBC: 207 ug/dL — ABNORMAL LOW (ref 215–435)
UIBC: 108 ug/dL — ABNORMAL LOW (ref 125–400)

## 2010-11-28 LAB — CBC
HCT: 19.5 % — ABNORMAL LOW (ref 39.0–52.0)
Hemoglobin: 7 g/dL — ABNORMAL LOW (ref 13.0–17.0)
RBC: 2.03 MIL/uL — ABNORMAL LOW (ref 4.22–5.81)
WBC: 23.8 10*3/uL — ABNORMAL HIGH (ref 4.0–10.5)

## 2010-11-28 LAB — DIFFERENTIAL
Band Neutrophils: 3 % (ref 0–10)
Basophils Relative: 0 % (ref 0–1)
Lymphocytes Relative: 31 % (ref 12–46)
Monocytes Relative: 6 % (ref 3–12)
Neutro Abs: 14.3 10*3/uL — ABNORMAL HIGH (ref 1.7–7.7)

## 2010-11-28 NOTE — H&P (Signed)
[name], [name]              ACCOUNT NO.:  [id number]  MEDICAL RECORD NO.:  [id number]  LOCATION:  WLED                         FACILITY:  [facility]  PHYSICIAN:  Gery Pray, MD      DATE OF BIRTH:  09/12/1970  DATE OF ADMISSION:  11/27/2010 DATE OF DISCHARGE:                             HISTORY & PHYSICAL   PRIMARY CARE PHYSICIAN:  Dr. August Saucer.  CODE STATUS:  Full code.  The patient goes to team IV.  CHIEF COMPLAINT:  Pain.  HISTORY OF PRESENT ILLNESS:  This is a 40 year old male with known history of sickle cell disease, who states that on Monday started getting pain in his hips and his legs.  He controlled it with p.o. pain medication and on Tuesday, got worse.  He did go to work Monday and Tuesday; however, on Tuesday night, everything fell apart.  He could not get his pain under control.  He came to the ER today.  The patient does take methadone 10 mg p.o. q.3 hours p.r.n.  He has nothing else for breakthrough pain.  He states he thought he had some subjective fevers, but he has no cough, no chills, no burning urination.  He took advil at 8:00 pm, fever has not recurred.  The patient states that he is usually hospitalized twice a year for sickle cell flare, he usually gets transfused probably 1 unit during those admissions.  History obtained from the patient, who appears reliable.  REVIEW OF SYSTEMS:  All 10-point systems reviewed and negative except as noted in the HPI.  PAST MEDICAL HISTORY:  Includes: 1. Gout. 2. Sickle cell anemia. 3. Irregular heart rate.  PAST SURGICAL HISTORY:  Includes: 1. Appendectomy. 2. cholecystectomy. 3. Splenectomy.  MEDICATIONS: 1. Methadone 10 mg q.3-4 hours p.r.n. 2. Patient is also on allopurinol and some of the heart medication,     which name he does not know.  ALLERGIES:  No known drug allergies.  SOCIAL HISTORY:  Negative for tobacco, alcohol, or illicit drugs. Patient lives alone.  He works at [employer] in [employer].  No home oxygen.  FAMILY HISTORY:  Significant for sickle cell disease.  PHYSICAL EXAMINATION:  VITAL SIGNS:  Blood pressure 124/58, pulse 105, respirations 22, temperature 99.9, satting 94% on room air. GENERAL:  Alert and oriented male, in moderate distress. EYES:  Jaundiced.  PERRLA.  ENT, Somewhat dry oral mucosa.  Trachea midline. NECK:  Supple.  No thyromegaly. LUNGS:  Clear to auscultation bilaterally.  No use of accessory muscles. No wheeze. CARDIOVASCULAR:  Regular rate and rhythm without murmurs, rigors, or gallops.  No JVD. ABDOMEN:  Soft.  Positive bowel sounds.  Nontender and nondistended.  No organomegaly. NEURO:  Cranial nerves II through XII grossly intact.  Sensation intact. MUSCULOSKELETAL:  Strength 5/5 in all extremities.  No clubbing, cyanosis, or edema. SKIN:  No rashes.  No subcutaneous crepitation.  LABS:  White blood count 23.6, hemoglobin 6.2, platelets 303.  Sodium 139, potassium 4.5, chloride 108, CO2 20, glucose 129, BUN 12, creatinine 1.77.  ASSESSMENT AND PLAN: 1. Sickle cell crisis.  Patient will be admitted.  We will resume     patient's home  medications and write some p.r.n. pain medications.     I am unclear if an infection could possibly precipitated patient's     current symptoms.  He does have a leukocytosis as well as a low-     grade temperature.  I am going to go ahead and get blood cultures,     urine cultures, and start patient on some empiric antibiotics.  I     will also get an anemia panel. 2. Gout.  Resume home medications. 3. Acute kidney injuries.  Patient's baseline creatinine is usually     3.64.  I will go ahead and check an urinalysis as stated to     evaluate for infection and start IV hydration.          ______________________________ Gery Pray, MD     DC/MEDQ  D:  11/27/2010  T:  11/28/2010  Job:  [id number]  Electronically Signed by Gery Pray MD on 11/28/2010 09:48:25 PM

## 2010-11-29 LAB — BASIC METABOLIC PANEL
CO2: 22 mEq/L (ref 19–32)
Chloride: 112 mEq/L (ref 96–112)
Creatinine, Ser: 0.83 mg/dL (ref 0.50–1.35)
Glucose, Bld: 96 mg/dL (ref 70–99)

## 2010-11-29 LAB — URINALYSIS, ROUTINE W REFLEX MICROSCOPIC
Bilirubin Urine: NEGATIVE
Ketones, ur: NEGATIVE mg/dL
Specific Gravity, Urine: 1.011 (ref 1.005–1.030)
Urobilinogen, UA: 2 mg/dL — ABNORMAL HIGH (ref 0.0–1.0)

## 2010-11-29 LAB — CBC
HCT: 19.7 % — ABNORMAL LOW (ref 39.0–52.0)
MCV: 100 fL (ref 78.0–100.0)
RBC: 1.97 MIL/uL — ABNORMAL LOW (ref 4.22–5.81)
RDW: 30.8 % — ABNORMAL HIGH (ref 11.5–15.5)
WBC: 14.8 10*3/uL — ABNORMAL HIGH (ref 4.0–10.5)

## 2010-11-29 LAB — VITAMIN B12: Vitamin B-12: 696 pg/mL (ref 211–911)

## 2010-11-29 LAB — RETICULOCYTES: Retic Count, Absolute: 776.2 10*3/uL — ABNORMAL HIGH (ref 19.0–186.0)

## 2010-11-29 LAB — URINE MICROSCOPIC-ADD ON

## 2010-11-29 LAB — FOLATE: Folate: 7.4 ng/mL

## 2010-11-30 LAB — DIFFERENTIAL
Eosinophils Relative: 4 % (ref 0–5)
Lymphocytes Relative: 22 % (ref 12–46)
Monocytes Absolute: 1.3 10*3/uL — ABNORMAL HIGH (ref 0.1–1.0)
Monocytes Relative: 15 % — ABNORMAL HIGH (ref 3–12)
Neutrophils Relative %: 58 % (ref 43–77)

## 2010-11-30 LAB — CROSSMATCH
Antibody Screen: NEGATIVE
Unit division: 0

## 2010-11-30 LAB — COMPREHENSIVE METABOLIC PANEL
ALT: 35 U/L (ref 0–53)
AST: 94 U/L — ABNORMAL HIGH (ref 0–37)
Albumin: 3 g/dL — ABNORMAL LOW (ref 3.5–5.2)
Calcium: 8.6 mg/dL (ref 8.4–10.5)
Creatinine, Ser: 0.69 mg/dL (ref 0.50–1.35)
GFR calc non Af Amer: >90 mL/min (ref >90)
Sodium: 138 mEq/L (ref 135–145)
Total Protein: 7.1 g/dL (ref 6.0–8.3)

## 2010-11-30 LAB — CBC
HCT: 24.5 % — ABNORMAL LOW (ref 39.0–52.0)
HCT: 23 % — ABNORMAL LOW (ref 39.0–52.0)
MCHC: 33.5 g/dL (ref 30.0–36.0)
MCV: 99.2 fL (ref 78.0–100.0)
RBC: 2.34 MIL/uL — ABNORMAL LOW (ref 4.22–5.81)
RDW: 28.5 % — ABNORMAL HIGH (ref 11.5–15.5)
RDW: 28 % — ABNORMAL HIGH (ref 11.5–15.5)
WBC: 8.8 10*3/uL (ref 4.0–10.5)

## 2010-11-30 LAB — URINE CULTURE
Colony Count: NO GROWTH
Culture  Setup Time: 201210260930
Special Requests: NEGATIVE

## 2010-11-30 LAB — PHOSPHORUS: Phosphorus: 4.3 mg/dL (ref 2.3–4.6)

## 2010-12-04 LAB — CULTURE, BLOOD (ROUTINE X 2)
Culture  Setup Time: 201210250342
Culture: NO GROWTH

## 2010-12-08 NOTE — Discharge Summary (Signed)
[name], [name]              ACCOUNT NO.:  [id number]  MEDICAL RECORD NO.:  [id number]  LOCATION:  1320                         FACILITY:  [facility]  PHYSICIAN:  Lindley Stachnik L. August Saucer, M.D.     DATE OF BIRTH:  Jun 26, 1970  DATE OF ADMISSION:  11/27/2010 DATE OF DISCHARGE:  12/02/2010                              DISCHARGE SUMMARY   FINAL DIAGNOSES: 1. Hemoglobin SS disease with crisis. 2. Left upper lobe pneumonia. 3. Situational stress. 4. Hyperuricemia, presently treated with allopurinol.  OPERATION PROCEDURES:  Transfusion of 2 units of packed RBCs.  HISTORY OF PRESENT ILLNESS:  The patient is a 40 year old black male with sickle cell disease who presented to emergency room complaining of increasing pain in his hips and legs.  He initially controlled with p.o. pain medication, but progressively got worse.  The patient went to work on Monday and Tuesday; however, on Tuesday night, had increasing pain. He subsequently came to the emergency room for further evaluation.  He felt at that time that he had some subjective fever, but no cough, chills, or burning urination.  The patient took Advil 8 p.m. and fever had not returned.  The patient usually is hospitalized approximately twice a year, but noted that his symptoms have been more severe recently.  The patient was evaluated in the emergency room.  He was subsequently transferred to the hospitalist service and admitted for further therapy.  PAST MEDICAL HISTORY:  As per admission H and P.  PHYSICAL EXAMINATION:  As per admission H and P.  HOSPITAL COURSE:  The patient was admitted for further treatment of his sickle cell crisis.  He was noted at that time to have significant leukocytosis with a white count of 23,600, hemoglobin was low at 6.2. His creatinine was notable at 1.77.  The patient was started on IV fluids for hydration.  A chest x-ray was done as well, which demonstrated a left upper lobe airspace disease consistent  with pneumonia.  He was noted to have cardiomegaly and pulmonary vascular congestion as well.  The patient was placed on Rocephin at 1 g IV daily as well.  He was given a DuoNeb nebulizer with flutter valve and incentive spirometry.  Over the subsequent days, the patient made continued improvement.  His lungs did clear.  His joint pains gradually subsided as well.  By October 29, he is feeling considerably better.  His O2 sats were 89% on room air.  The patient expressed a strong desire to return home with further outpatient monitoring and management.  He was switched to Avelox p.o., which he tolerated well.  He was subsequently discharged home, improved.  DISCHARGE MEDICATIONS: 1. Avelox 400 mg daily for additional 7 days. 2. Folic acid 1 mg daily. 3. Hydromorphone 4 mg q.4 h. p.r.n. pain. 4. Ibuprofen 800 mg t.i.d. p.r.n. 5. Zofran 4 mg q.6 h. p.r.n. 6. Allopurinol 300 mg daily. 7. Ambien 10 mg q.h.s. p.r.n. 8. Methadone 10 mg q.4 h. p.r.n. 9. Metoprolol succinate 25 mg daily.  The patient will be maintained on a low-sodium diet.  He is to be seen in office in 1 week's time for followup.  ______________________________ Lind Guest August Saucer, M.D.     ELD/MEDQ  D:  12/08/2010  T:  12/08/2010  Job:  [id number]

## 2011-03-22 ENCOUNTER — Other Ambulatory Visit: Payer: Self-pay

## 2011-03-22 ENCOUNTER — Encounter (HOSPITAL_COMMUNITY): Payer: Self-pay | Admitting: *Deleted

## 2011-03-22 ENCOUNTER — Inpatient Hospital Stay (HOSPITAL_COMMUNITY)
Admission: EM | Admit: 2011-03-22 | Discharge: 2011-03-28 | DRG: 395 | Disposition: A | Discharge status: Home / Self Care | Payer: BC Managed Care – PPO | Attending: Internal Medicine | Admitting: Internal Medicine

## 2011-03-22 DIAGNOSIS — D571 Sickle-cell disease without crisis: Secondary | ICD-10-CM | POA: Diagnosis present

## 2011-03-22 DIAGNOSIS — M109 Gout, unspecified: Secondary | ICD-10-CM

## 2011-03-22 DIAGNOSIS — E875 Hyperkalemia: Secondary | ICD-10-CM | POA: Diagnosis not present

## 2011-03-22 DIAGNOSIS — G8929 Other chronic pain: Secondary | ICD-10-CM

## 2011-03-22 DIAGNOSIS — D72829 Elevated white blood cell count, unspecified: Secondary | ICD-10-CM

## 2011-03-22 DIAGNOSIS — D5701 Hb-SS disease with acute chest syndrome: Secondary | ICD-10-CM

## 2011-03-22 DIAGNOSIS — D5700 Hb-SS disease with crisis, unspecified: Principal | ICD-10-CM | POA: Diagnosis present

## 2011-03-22 DIAGNOSIS — R7989 Other specified abnormal findings of blood chemistry: Secondary | ICD-10-CM | POA: Diagnosis present

## 2011-03-22 DIAGNOSIS — E876 Hypokalemia: Secondary | ICD-10-CM

## 2011-03-22 HISTORY — DX: Sickle-cell disease without crisis: D57.1

## 2011-03-23 ENCOUNTER — Encounter (HOSPITAL_COMMUNITY): Payer: Self-pay | Admitting: Family Medicine

## 2011-03-23 ENCOUNTER — Emergency Department (HOSPITAL_COMMUNITY): Payer: BC Managed Care – PPO

## 2011-03-23 DIAGNOSIS — D571 Sickle-cell disease without crisis: Secondary | ICD-10-CM | POA: Diagnosis present

## 2011-03-23 DIAGNOSIS — M109 Gout, unspecified: Secondary | ICD-10-CM

## 2011-03-23 LAB — DIFFERENTIAL
Band Neutrophils: 0 % (ref 0–10)
Basophils Absolute: 0 10*3/uL (ref 0.0–0.1)
Basophils Relative: 0 % (ref 0–1)
Eosinophils Absolute: 1 10*3/uL — ABNORMAL HIGH (ref 0.0–0.7)
Eosinophils Relative: 5 % (ref 0–5)
Myelocytes: 0 %
Neutrophils Relative %: 49 % (ref 43–77)
Promyelocytes Absolute: 0 %

## 2011-03-23 LAB — URINALYSIS, ROUTINE W REFLEX MICROSCOPIC
Bilirubin Urine: NEGATIVE
Bilirubin Urine: NEGATIVE
Glucose, UA: NEGATIVE mg/dL
Glucose, UA: NEGATIVE mg/dL
Ketones, ur: NEGATIVE mg/dL
Ketones, ur: NEGATIVE mg/dL
Leukocytes, UA: NEGATIVE
Protein, ur: 100 mg/dL — AB
pH: 6 (ref 5.0–8.0)
pH: 6.5 (ref 5.0–8.0)

## 2011-03-23 LAB — CBC
HCT: 18 % — ABNORMAL LOW (ref 39.0–52.0)
Hemoglobin: 6.5 g/dL — CL (ref 13.0–17.0)
Hemoglobin: 7 g/dL — ABNORMAL LOW (ref 13.0–17.0)
MCH: 33.9 pg (ref 26.0–34.0)
MCHC: 36.1 g/dL — ABNORMAL HIGH (ref 30.0–36.0)
MCHC: 36.1 g/dL — ABNORMAL HIGH (ref 30.0–36.0)
MCV: 93.8 fL (ref 78.0–100.0)
Platelets: 342 K/uL (ref 150–400)
Platelets: 276 10*3/uL (ref 150–400)
RBC: 1.92 MIL/uL — ABNORMAL LOW (ref 4.22–5.81)
RDW: 29.1 % — ABNORMAL HIGH (ref 11.5–15.5)
RDW: 28.3 % — ABNORMAL HIGH (ref 11.5–15.5)
WBC: 19.1 K/uL — ABNORMAL HIGH (ref 4.0–10.5)

## 2011-03-23 LAB — URINE MICROSCOPIC-ADD ON

## 2011-03-23 LAB — BASIC METABOLIC PANEL
BUN: 14 mg/dL (ref 6–23)
Creatinine, Ser: 0.88 mg/dL (ref 0.50–1.35)
GFR calc Af Amer: >90 mL/min (ref >90)
GFR calc non Af Amer: >90 mL/min (ref >90)
Potassium: 4.4 mEq/L (ref 3.5–5.1)

## 2011-03-23 LAB — CARDIAC PANEL(CRET KIN+CKTOT+MB+TROPI)
Relative Index: INVALID (ref 0.0–2.5)
Troponin I: <0.3 ng/mL (ref <0.30)

## 2011-03-23 LAB — RETICULOCYTES
RBC.: 1.74 MIL/uL — ABNORMAL LOW (ref 4.22–5.81)
Retic Ct Pct: 32.8 % — ABNORMAL HIGH (ref 0.4–3.1)
Retic Ct Pct: 32.7 % — ABNORMAL HIGH (ref 0.4–3.1)

## 2011-03-23 LAB — IRON AND TIBC: UIBC: <15 ug/dL — ABNORMAL LOW (ref 125–400)

## 2011-03-23 MED ORDER — HYDROMORPHONE HCL PF 1 MG/ML IJ SOLN
1.0000 mg | Freq: Once | INTRAMUSCULAR | Status: AC
  Administered 2011-03-23: 1 mg via INTRAVENOUS
  Filled 2011-03-23: qty 1

## 2011-03-23 MED ORDER — HYDROMORPHONE HCL PF 1 MG/ML IJ SOLN
2.0000 mg | INTRAMUSCULAR | Status: DC | PRN
  Administered 2011-03-23 (×2): 1 mg via INTRAVENOUS
  Administered 2011-03-23: 2 mg via INTRAVENOUS
  Administered 2011-03-23 (×2): 1 mg via INTRAVENOUS
  Administered 2011-03-24 (×2): 2 mg via INTRAVENOUS
  Filled 2011-03-23: qty 1
  Filled 2011-03-23: qty 2
  Filled 2011-03-23 (×3): qty 1
  Filled 2011-03-23 (×2): qty 2

## 2011-03-23 MED ORDER — METHADONE HCL 10 MG PO TABS
10.0000 mg | ORAL_TABLET | Freq: Three times a day (TID) | ORAL | Status: DC
  Administered 2011-03-23 – 2011-03-28 (×7): 10 mg via ORAL
  Filled 2011-03-23 (×10): qty 1

## 2011-03-23 MED ORDER — ENOXAPARIN SODIUM 40 MG/0.4ML ~~LOC~~ SOLN
40.0000 mg | SUBCUTANEOUS | Status: DC
  Administered 2011-03-23 – 2011-03-27 (×5): 40 mg via SUBCUTANEOUS
  Filled 2011-03-23 (×6): qty 0.4

## 2011-03-23 MED ORDER — OXYCODONE HCL 5 MG PO TABS: 5.0000 mg | ORAL_TABLET | ORAL | Status: DC | PRN

## 2011-03-23 MED ORDER — SODIUM CHLORIDE 0.9 % IV BOLUS (SEPSIS)
1000.0000 mL | INTRAVENOUS | Status: AC
  Administered 2011-03-23: 1000 mL via INTRAVENOUS

## 2011-03-23 MED ORDER — ONDANSETRON HCL 4 MG/2ML IJ SOLN
4.0000 mg | Freq: Once | INTRAMUSCULAR | Status: AC
  Administered 2011-03-23: 4 mg via INTRAVENOUS
  Filled 2011-03-23: qty 2

## 2011-03-23 MED ORDER — DEXTROSE 5 % IV SOLN
1.0000 g | INTRAVENOUS | Status: DC
  Administered 2011-03-24 – 2011-03-28 (×5): 1 g via INTRAVENOUS
  Filled 2011-03-23 (×6): qty 10

## 2011-03-23 MED ORDER — HYDROMORPHONE HCL PF 1 MG/ML IJ SOLN
INTRAMUSCULAR | Status: AC
  Administered 2011-03-23: 1 mg via INTRAVENOUS
  Filled 2011-03-23: qty 1

## 2011-03-23 MED ORDER — DEXTROSE 5 % IV SOLN
1.0000 g | Freq: Once | INTRAVENOUS | Status: AC
  Administered 2011-03-23: 1 g via INTRAVENOUS
  Filled 2011-03-23: qty 10

## 2011-03-23 MED ORDER — POTASSIUM CHLORIDE IN NACL 20-0.9 MEQ/L-% IV SOLN
INTRAVENOUS | Status: DC
  Administered 2011-03-23 – 2011-03-24 (×3): via INTRAVENOUS
  Filled 2011-03-23 (×4): qty 1000

## 2011-03-23 MED ORDER — IOHEXOL 300 MG/ML  SOLN
100.0000 mL | Freq: Once | INTRAMUSCULAR | Status: AC | PRN
  Administered 2011-03-23: 100 mL via INTRAVENOUS

## 2011-03-23 NOTE — Progress Notes (Signed)
Pt ordered 2mg  Dilaudid IVP q4hr PRN pain.  Dr. Algie Coffer notified of medication error, I only administered 1mg /ml @ 10:26am and @ 14:37pm.  Discovery made and MD paged for notification. Pt c/o 8/10 pain, decreasing down to 3/10. Pain managed and pt is without complaints.  Will continue to monitor and administer correct dosages.

## 2011-03-23 NOTE — H&P (Signed)
PCP:   Willey Blade  Chief Complaint:  Pain  HPI: This a 41 year old male with known history of sickle cell anemia. He states he is in chronic pain but today his pain became significantly worse. His pain was located between the shoulder blades where he states he never gets pain. He also had a mid chest pain. He also had pleuritic chest pain with deep breathing. He states he felt febrile but this is subjective. Reports no nausea, vomiting, burning or urination. His pain at its worse is 7/10. History provided by patient.  Review of Systems: Positives bolded   anorexia, fever, weight loss,, vision loss, decreased hearing, hoarseness, chest pain, syncope, dyspnea on exertion, peripheral edema, balance deficits, hemoptysis, abdominal pain, melena, hematochezia, severe indigestion/heartburn, hematuria, incontinence, genital sores, muscle weakness, suspicious skin lesions, transient blindness, difficulty walking, depression, unusual weight change, abnormal bleeding, enlarged lymph nodes, angioedema, and breast masses.  Past Medical History: Past Medical History  Diagnosis Date  . CHF (congestive heart failure)   . Gouty arthropathy   . Chronic fatigue syndrome   . Sickle cell anemia    Past Surgical History  Procedure Date  . Appendectomy   . Cholecystectomy   . Splenectomy     Medications: Prior to Admission medications   Medication Sig Start Date End Date Taking? Authorizing Provider  methadone (DOLOPHINE) 10 MG tablet Take 10 mg by mouth 3 (three) times daily.     Yes Historical Provider, MD    Allergies:  No Known Allergies  Social History:  reports that he has never smoked. He does not have any smokeless tobacco history on file. He reports that he does not drink alcohol or use illicit drugs.  Family History: Family History  Problem Relation Age of Onset  . Sickle cell anemia      Physical Exam: Filed Vitals:   03/22/11 2353 03/22/11 2356 03/23/11 0005 03/23/11 0020  BP:       Pulse:   91   Temp:      TempSrc:      Resp:   21   Height:      Weight:      SpO2: 91% 97% 100% 100%    General:  Alert and oriented times three, well developed and nourished, no acute distress Eyes: PERRLA, pink conjunctiva, scleral icterus ENT: Moist oral mucosa, neck supple, no thyromegaly Lungs: clear to ascultation, no wheeze, no crackles, no use of accessory muscles Cardiovascular: regular rate and rhythm, no regurgitation, no gallops, no murmurs. No carotid bruits, no JVD Abdomen: soft, positive BS, non-tender, non-distended, no organomegaly, not an acute abdomen GU: not examined Neuro: CN II - XII grossly intact, sensation intact Musculoskeletal: strength 5/5 all extremities, no clubbing, cyanosis or edema Skin: no rash, no subcutaneous crepitation, no decubitus Psych: appropriate patient   Labs on Admission:   Basename 03/23/11 0045  NA 137  K 4.4  CL 108  CO2 22  GLUCOSE 97  BUN 14  CREATININE 0.88  CALCIUM 8.4  MG --  PHOS --   No results found for this basename: AST:2,ALT:2,ALKPHOS:2,BILITOT:2,PROT:2,ALBUMIN:2 in the last 72 hours No results found for this basename: LIPASE:2,AMYLASE:2 in the last 72 hours  Basename 03/23/11 0045  WBC 19.1*  NEUTROABS 9.3*  HGB 6.5*  HCT 18.0*  MCV 93.8  PLT 342    Basename 03/23/11 0045  CKTOTAL 66  CKMB 1.1  CKMBINDEX --  TROPONINI <0.30   No components found with this basename: POCBNP:3 No results found for this  basename: DDIMER:2 in the last 72 hours No results found for this basename: HGBA1C:2 in the last 72 hours No results found for this basename: CHOL:2,HDL:2,LDLCALC:2,TRIG:2,CHOLHDL:2,LDLDIRECT:2 in the last 72 hours No results found for this basename: TSH,T4TOTAL,FREET3,T3FREE,THYROIDAB in the last 72 hours  Basename 03/23/11 0045  VITAMINB12 --  FOLATE --  FERRITIN --  TIBC --  IRON --  RETICCTPCT 32.8*    Micro Results: No results found for this or any previous visit (from the past 240  hour(s)).   Radiological Exams on Admission: Dg Chest 2 View  03/23/2011  *RADIOLOGY REPORT*  Clinical Data: Shortness of breath; anterior chest pain and pain between shoulder blades.  History of sickle cell disease and congestive heart failure.  CHEST - 2 VIEW  Comparison: Chest radiograph performed 11/28/2010  Findings: Mild bilateral midlung airspace opacity is noted; this may reflect acute chest syndrome.  Mild pneumonia cannot be excluded.  Chronic vascular congestion is seen, without significant pulmonary edema.  No pleural effusion or pneumothorax is identified.  The heart is borderline enlarged.  No acute osseous abnormalities are identified.  IMPRESSION:  1.  Mild bilateral midlung airspace opacity may reflect acute chest syndrome; mild pneumonia cannot be excluded. 2.  Chronic vascular congestion and borderline cardiomegaly, without significant pulmonary edema.  Original Report Authenticated By: Tonia Ghent, M.D.   Ct Angio Chest W/cm &/or Wo Cm  03/23/2011  *RADIOLOGY REPORT*  Clinical Data: Chest pain for 5 hours; back pain, between the shoulder blades.  Leukocytosis.  CT ANGIOGRAPHY CHEST  Technique:  Multidetector CT imaging of the chest using the standard protocol during bolus administration of intravenous contrast. Multiplanar reconstructed images including MIPs were obtained and reviewed to evaluate the vascular anatomy.  Contrast: OMNIPAQUE IOHEXOL 300 MG/ML IV SOLN  Comparison: Chest radiograph performed earlier today at 01:42 a.m., and CTA of the chest performed 03/29/2008  Findings: There is no evidence of central pulmonary embolus. Evaluation for pulmonary embolus is mildly suboptimal due to limitations in the timing of the contrast bolus; this may reflect decreased cardiac function, as contrast appears to have progressed to the thoracic aorta.  There is diffuse prominence of the interstitium, particularly at the lower lung lobes; this may reflect minimal interstitial edema, or  possibly underlying interstitial lung disease.  There is no definite evidence of pneumonia; there is no evidence of focal consolidation to suggest acute chest syndrome.  There is no evidence of pleural effusion or pneumothorax.  No masses are identified; no abnormal focal contrast enhancement is seen.  The heart is enlarged.  No pericardial effusion is identified.  The great vessels are unremarkable in appearance.  Scattered mildly enlarged periaortic, azygoesophageal recess and superior mediastinal nodes are seen, measuring up to 1.3 cm in size.  Smaller right paratracheal nodes are seen.  No axillary lymphadenopathy is seen.  The visualized portions of the thyroid gland are unremarkable in appearance.  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  The spleen is somewhat diminutive in appearance.  No acute osseous abnormalities are seen; mildly H-shaped vertebrae are noted at the mid thoracic spine.  IMPRESSION:  1.  No evidence of central pulmonary embolus; this may reflect decreased cardiac function. 2.  Diffuse prominence of the interstitium, particularly at the lower lung lobe; this may reflect minimal interstitial edema, or possibly underlying interstitial lung disease.  No definite evidence of pneumonia or acute chest pain.  3.  Cardiomegaly noted. 4.  Mediastinal lymphadenopathy seen, with nodes measuring up to 1.3 cm in size. 5.  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  Findings were discussed with Dr. Eber Hong at 03:25 a.m. on 03/23/2011.  Original Report Authenticated By: Tonia Ghent, M.D.   EKG normal sinus rhythm  Assessment/Plan Present on Admission:  .Sickle cell anemia with crisis Jaundice Admit to telemetry concern for acute chest,  Will transfuse one unit packed red blood cell IV fluids ordered Pain medications as needed Willey Blade and it seemed here in a.m. Leukocytosis Blood cultures and urine cultures ordered Chest pain and mediastinal lymphadenopathy Will order antibiotics until cultures are resulted  Full code DVT prophylaxis   Lequisha Cammack 03/23/2011, 4:04 AM

## 2011-03-23 NOTE — ED Provider Notes (Signed)
History     CSN: [id number]  Arrival date & time 03/22/11  2335   First MD Initiated Contact with Patient 03/22/11 2354      Chief Complaint  Patient presents with  . Chest Pain  . Shortness of Breath    SaO2 = 83% on RA    (Consider location/radiation/quality/duration/timing/severity/associated sxs/prior treatment) HPI Comments: 41 year old male with a history of sickle cell disease, congestive heart failure. He presents with a complaint of chest and back pain which had onset this evening. This was acute in onset initially of the chest with a very quick spread to the upper back around his "shoulder blades". This was acute in onset, constant, severe and associated with shortness of breath. He denies any swelling in his legs, recent trauma or surgery or immobilization or travel. He denies history of thromboembolism. He admits to several days of arthralgias and body aches and felt "feverish" this evening. He denies cough, vomiting or nausea, swelling in the legs or abdominal pain. He denies dysuria or diarrhea  Patient is a 41 y.o. male presenting with chest pain and shortness of breath. The history is provided by the patient and medical records.  Chest Pain Primary symptoms include shortness of breath.  Pertinent negatives for associated symptoms include no diaphoresis.    Shortness of Breath  Associated symptoms include chest pain and shortness of breath.    Past Medical History  Diagnosis Date  . CHF (congestive heart failure)   . Gouty arthropathy   . Chronic fatigue syndrome   . Sickle cell anemia     No past surgical history on file.  No family history on file.  History  Substance Use Topics  . Smoking status: Not on file  . Smokeless tobacco: Not on file  . Alcohol Use:       Review of Systems  Constitutional: Negative for diaphoresis.  Respiratory: Positive for shortness of breath.   Cardiovascular: Positive for chest pain.  All other systems reviewed and  are negative.    Allergies  Review of patient's allergies indicates no known allergies.  Home Medications   Current Outpatient Rx  Name Route Sig Dispense Refill  . METHADONE HCL 10 MG PO TABS Oral Take 10 mg by mouth 3 (three) times daily.        BP 133/66  Pulse 91  Temp(Src) 100 F (37.8 C) (Oral)  Resp 21  Ht 5\' 9"  (1.753 m)  Wt 176 lb (79.833 kg)  BMI 25.99 kg/m2  SpO2 100%  Physical Exam  Nursing note and vitals reviewed. Constitutional: He appears well-developed and well-nourished. No distress.  HENT:  Head: Normocephalic and atraumatic.  Mouth/Throat: Oropharynx is clear and moist. No oropharyngeal exudate.  Eyes: EOM are normal. Pupils are equal, round, and reactive to light. Right eye exhibits no discharge. Left eye exhibits no discharge. Scleral icterus is present.  Neck: Normal range of motion. Neck supple. No JVD present. No thyromegaly present.  Cardiovascular: Regular rhythm, normal heart sounds and intact distal pulses.  Exam reveals no gallop and no friction rub.   No murmur heard.      Pulse of 95  Pulmonary/Chest: Effort normal. No respiratory distress. He has no wheezes. He has rales ( Scattered rales left base, clears with deep breathing and coughing).  Abdominal: Soft. Bowel sounds are normal. He exhibits no distension and no mass. There is no tenderness.  Musculoskeletal: Normal range of motion. He exhibits no edema and no tenderness.  Lymphadenopathy:  He has no cervical adenopathy.  Neurological: He is alert. Coordination normal.  Skin: Skin is warm and dry. No rash noted. No erythema.  Psychiatric: He has a normal mood and affect. His behavior is normal.    ED Course  Procedures (including critical care time)  Procedure Note :  Peripheral IV placement  I have personally placed an ultrasound guided 20  gauge IV angiocath in the R AC  blood draw and saline flush successful and sterile dressing applied.  Pt tolerated without complaint or  complication   Labs Reviewed  CBC - Abnormal; Notable for the following:    WBC 19.1 (*)    RBC 1.92 (*)    Hemoglobin 6.5 (*)    HCT 18.0 (*)    MCHC 36.1 (*)    RDW 29.1 (*)    All other components within normal limits  DIFFERENTIAL - Abnormal; Notable for the following:    nRBC 24 (*)    Neutro Abs 9.3 (*)    Lymphs Abs 7.1 (*)    Monocytes Absolute 1.7 (*)    Eosinophils Absolute 1.0 (*)    All other components within normal limits  RETICULOCYTES - Abnormal; Notable for the following:    Retic Ct Pct 32.8 (*) RESULTS CONFIRMED BY MANUAL DILUTION   RBC. 1.92 (*)    Retic Count, Manual 629.8 (*)    All other components within normal limits  BASIC METABOLIC PANEL  CARDIAC PANEL(CRET KIN+CKTOT+MB+TROPI)  CULTURE, BLOOD (ROUTINE X 2)  CULTURE, BLOOD (ROUTINE X 2)  RETICULOCYTES  DIFFERENTIAL  URINALYSIS, ROUTINE W REFLEX MICROSCOPIC   Dg Chest 2 View  03/23/2011  *RADIOLOGY REPORT*  Clinical Data: Shortness of breath; anterior chest pain and pain between shoulder blades.  History of sickle cell disease and congestive heart failure.  CHEST - 2 VIEW  Comparison: Chest radiograph performed 11/28/2010  Findings: Mild bilateral midlung airspace opacity is noted; this may reflect acute chest syndrome.  Mild pneumonia cannot be excluded.  Chronic vascular congestion is seen, without significant pulmonary edema.  No pleural effusion or pneumothorax is identified.  The heart is borderline enlarged.  No acute osseous abnormalities are identified.  IMPRESSION:  1.  Mild bilateral midlung airspace opacity may reflect acute chest syndrome; mild pneumonia cannot be excluded. 2.  Chronic vascular congestion and borderline cardiomegaly, without significant pulmonary edema.  Original Report Authenticated By: Tonia Ghent, M.D.   Ct Angio Chest W/cm &/or Wo Cm  03/23/2011  *RADIOLOGY REPORT*  Clinical Data: Chest pain for 5 hours; back pain, between the shoulder blades.  Leukocytosis.  CT  ANGIOGRAPHY CHEST  Technique:  Multidetector CT imaging of the chest using the standard protocol during bolus administration of intravenous contrast. Multiplanar reconstructed images including MIPs were obtained and reviewed to evaluate the vascular anatomy.  Contrast: OMNIPAQUE IOHEXOL 300 MG/ML IV SOLN  Comparison: Chest radiograph performed earlier today at 01:42 a.m., and CTA of the chest performed 03/29/2008  Findings: There is no evidence of central pulmonary embolus. Evaluation for pulmonary embolus is mildly suboptimal due to limitations in the timing of the contrast bolus; this may reflect decreased cardiac function, as contrast appears to have progressed to the thoracic aorta.  There is diffuse prominence of the interstitium, particularly at the lower lung lobes; this may reflect minimal interstitial edema, or possibly underlying interstitial lung disease.  There is no definite evidence of pneumonia; there is no evidence of focal consolidation to suggest acute chest syndrome.  There is no evidence of  pleural effusion or pneumothorax.  No masses are identified; no abnormal focal contrast enhancement is seen.  The heart is enlarged.  No pericardial effusion is identified.  The great vessels are unremarkable in appearance.  Scattered mildly enlarged periaortic, azygoesophageal recess and superior mediastinal nodes are seen, measuring up to 1.3 cm in size.  Smaller right paratracheal nodes are seen.  No axillary lymphadenopathy is seen.  The visualized portions of the thyroid gland are unremarkable in appearance.  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  The spleen is somewhat diminutive in appearance.  No acute osseous abnormalities are seen; mildly H-shaped vertebrae are noted at the mid thoracic spine.  IMPRESSION:  1.  No evidence of central pulmonary embolus;  this may reflect decreased cardiac function. 2.  Diffuse prominence of the interstitium, particularly at the lower lung lobe; this may reflect minimal interstitial edema, or possibly underlying interstitial lung disease.  No definite evidence of pneumonia or acute chest pain. 3.  Cardiomegaly noted. 4.  Mediastinal lymphadenopathy seen, with nodes measuring up to 1.3 cm in size. 5.  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  Findings were discussed with Dr. Eber Hong at 03:25 a.m. on 03/23/2011.  Original Report Authenticated By: Tonia Ghent, M.D.    Disposition: Admit  1. Chest pain       MDM  Patient appears to have pulmonary process causing hypoxia and likely fever. Given description of his pain would consider both acute chest syndrome, pulmonary embolism but less likely to be cardiac etiology. EKG is nonischemic, T waves slightly peaked in anterior leads, will proceed with laboratory workup, pain control, supplemental oxygen.  On arrival oxygen level was 83% on room air, mental status is normal. On supplemental oxygen at 4 L his oxygen level is 97%. He is not in respiratory distress  ED ECG REPORT   Date: 03/22/11  23:43 AM  Rate: 98  Rhythm: normal sinus rhythm  QRS Axis: normal  Intervals: normal  ST/T Wave abnormalities: normal  Conduction Disutrbances:none  Narrative Interpretation: Left ventricular hypertrophy  Old EKG Reviewed: Compared with 05/22/2010, left ventricular hypertrophy persists, rate slower on today's EKG. No other significant change   Review of the medical records shows that the patient was admitted for left upper lobe pneumonia in October of 2012. Otherwise the patient states that he has been seen by his primary physician and has good outpatient control of his sickle cell disease pain.  CT scan with poor contrast timing, note definite comment on  PE however there does not appear to be any significant signs of acute chest syndrome or infection, we'll admit to the hospital for ongoing pain, significant anemia.   Discussed care with hospitalist on call who agrees to admit the patient. Discussed with radiologist results of CT scan, contrast was not optimal.  Vida Roller, MD 03/23/11 [phone]

## 2011-03-23 NOTE — ED Notes (Signed)
Pt having chest pain 10/10 and pain between shoulder blades, Dr Hyacinth Meeker aware,  Gave verbal order for 1mg  Dilaudid IVP

## 2011-03-23 NOTE — ED Notes (Signed)
Attempted several return calls to [name] pt's son,  To no avail,  Spoke with charge RN Dois Davenport pt to be transported home via [facility] [facility]

## 2011-03-24 DIAGNOSIS — D72829 Elevated white blood cell count, unspecified: Secondary | ICD-10-CM

## 2011-03-24 DIAGNOSIS — E876 Hypokalemia: Secondary | ICD-10-CM

## 2011-03-24 DIAGNOSIS — G8929 Other chronic pain: Secondary | ICD-10-CM

## 2011-03-24 DIAGNOSIS — D5701 Hb-SS disease with acute chest syndrome: Secondary | ICD-10-CM

## 2011-03-24 LAB — COMPREHENSIVE METABOLIC PANEL
ALT: 42 U/L (ref 0–53)
Albumin: 2.8 g/dL — ABNORMAL LOW (ref 3.5–5.2)
Alkaline Phosphatase: 131 U/L — ABNORMAL HIGH (ref 39–117)
Potassium: 5.2 mEq/L — ABNORMAL HIGH (ref 3.5–5.1)
Sodium: 138 mEq/L (ref 135–145)
Total Protein: 7.1 g/dL (ref 6.0–8.3)

## 2011-03-24 LAB — FOLATE: Folate: 6.3 ng/mL

## 2011-03-24 LAB — CBC
MCHC: 35.7 g/dL (ref 30.0–36.0)
Platelets: 285 10*3/uL (ref 150–400)
RDW: 27.8 % — ABNORMAL HIGH (ref 11.5–15.5)

## 2011-03-24 MED ORDER — ACETAMINOPHEN 650 MG RE SUPP: 650.0000 mg | RECTAL | Status: DC | PRN

## 2011-03-24 MED ORDER — FOLIC ACID 1 MG PO TABS
1.0000 mg | ORAL_TABLET | Freq: Every day | ORAL | Status: DC
  Administered 2011-03-24 – 2011-03-28 (×5): 1 mg via ORAL
  Filled 2011-03-24 (×5): qty 1

## 2011-03-24 MED ORDER — DEXTROSE-NACL 5-0.45 % IV SOLN
INTRAVENOUS | Status: DC
  Administered 2011-03-24 – 2011-03-28 (×3): via INTRAVENOUS

## 2011-03-24 MED ORDER — SENNOSIDES-DOCUSATE SODIUM 8.6-50 MG PO TABS
1.0000 | ORAL_TABLET | Freq: Every day | ORAL | Status: DC
  Administered 2011-03-25 – 2011-03-27 (×3): 1 via ORAL
  Filled 2011-03-24 (×5): qty 1

## 2011-03-24 MED ORDER — LEVALBUTEROL HCL 0.63 MG/3ML IN NEBU
0.6300 mg | INHALATION_SOLUTION | Freq: Four times a day (QID) | RESPIRATORY_TRACT | Status: DC
  Filled 2011-03-24 (×7): qty 3

## 2011-03-24 MED ORDER — ONDANSETRON HCL 4 MG/2ML IJ SOLN: 4.0000 mg | INTRAMUSCULAR | Status: DC | PRN

## 2011-03-24 MED ORDER — HYDROXYUREA 500 MG PO CAPS
500.0000 mg | ORAL_CAPSULE | Freq: Two times a day (BID) | ORAL | Status: DC
  Administered 2011-03-24 – 2011-03-28 (×8): 500 mg via ORAL
  Filled 2011-03-24 (×9): qty 1

## 2011-03-24 MED ORDER — HYDROMORPHONE HCL PF 1 MG/ML IJ SOLN
2.0000 mg | INTRAMUSCULAR | Status: DC
  Administered 2011-03-24 – 2011-03-25 (×4): 2 mg via INTRAVENOUS
  Filled 2011-03-24 (×4): qty 2

## 2011-03-24 MED ORDER — PANTOPRAZOLE SODIUM 40 MG PO TBEC
40.0000 mg | DELAYED_RELEASE_TABLET | Freq: Every day | ORAL | Status: DC
  Administered 2011-03-24 – 2011-03-28 (×5): 40 mg via ORAL
  Filled 2011-03-24 (×5): qty 1

## 2011-03-24 MED ORDER — DIPHENHYDRAMINE HCL 50 MG/ML IJ SOLN: 12.5000 mg | INTRAMUSCULAR | Status: DC | PRN

## 2011-03-24 MED ORDER — IBUPROFEN 800 MG PO TABS
800.0000 mg | ORAL_TABLET | Freq: Three times a day (TID) | ORAL | Status: DC
  Administered 2011-03-24 – 2011-03-28 (×9): 800 mg via ORAL
  Filled 2011-03-24 (×18): qty 1

## 2011-03-24 MED ORDER — ACETAMINOPHEN 325 MG PO TABS
650.0000 mg | ORAL_TABLET | ORAL | Status: DC | PRN
  Filled 2011-03-24: qty 2

## 2011-03-24 MED ORDER — OXYCODONE HCL 5 MG PO TABS
10.0000 mg | ORAL_TABLET | ORAL | Status: DC
  Administered 2011-03-24 – 2011-03-28 (×15): 10 mg via ORAL
  Filled 2011-03-24 (×16): qty 2

## 2011-03-24 MED ORDER — SODIUM CHLORIDE 0.45 % IV SOLN: INTRAVENOUS | Status: DC

## 2011-03-24 NOTE — H&P (Signed)
Subjective: The patient was seen on rounds today.  The patient was sitting quietly in his bed. The patient is not exactly sure what triggered his current crisis but it could have been precipitated by a combination of the recent cold weather and stress.  The patient is complaining of pain 3/10 in his cervical area and between his shoulder blades.  The patient is also complaining of chronic pain in his right knee.  Explained to the patient the care plan including blood transfusion/blood exchange, PICC line insertion, hydroxyurea, IS usage and pain management - the patient stated that he understood and agreed.  The patient declined receiving a PCA. The patient had a small bowel movement today.  The patient stated that his chest pain has resolved.  No other nursing or patient concerns.   Objective: Vital signs in last 24 hours: Blood pressure 111/63, pulse 71, temperature 98.5 F (36.9 C), temperature source Oral, resp. rate 18, height 5\' 11"  (1.803 m), weight 172 lb 2.9 oz (78.1 kg), SpO2 92.00%.  General Appearance: Alert, cooperative, well developed, well nourished, moderate distress Head: Normocephalic, without obvious abnormality, atraumatic Eyes: PERRLA, EOMI, scleral icterus Nose: Nares, septum and mucosa are normal, no drainage or sinus tenderness Throat: Lips, mucosa, and tongue are normal; teeth and gums are normal Neck: No adenopathy, supple, symmetrical, trachea midline, thyroid not enlarged, cervical tenderness no masses or nodules, limited ROM Back: Symmetric, no curvature, normal ROM, no CVA tenderness Resp: Diminished breath sounds bibasilar and bilaterally, CTA, no wheezes/rales/rhonchi Cardio: Regular rate and rhythm, S1, S2 normal, no murmur, click, rub or gallop GI: Soft, non-tender, slight distension, normal bowel sounds,  no masses,  no organomegaly Male Genitalia: Deferred, the patient denies priapism Extremities: Normal,  atraumatic, no cyanosis or edema,  Homans sign is  negative, no sign of DVT, RLE tenderness (knee area) Pulses: 2+ and symmetric Skin: Skin color, texture, turgor normal, no rashes or lesions, dry skin, tattoos Neurologic: Grossly normal, no focal deficits, CN II - XII intact Psych:  Appropriate affect  Lab Results: Results for orders placed during the hospital encounter of 03/22/11 (from the past 24 hour(s))  URINALYSIS, ROUTINE W REFLEX MICROSCOPIC     Status: Abnormal   Collection Time   03/23/11  6:27 PM      Component Value Range   Color, Urine AMBER (*) YELLOW    APPearance CLEAR  CLEAR    Specific Gravity, Urine 1.012  1.005 - 1.030    pH 6.5  5.0 - 8.0    Glucose, UA NEGATIVE  NEGATIVE (mg/dL)   Hgb urine dipstick MODERATE (*) NEGATIVE    Bilirubin Urine NEGATIVE  NEGATIVE    Ketones, ur NEGATIVE  NEGATIVE (mg/dL)   Protein, ur 30 (*) NEGATIVE (mg/dL)   Urobilinogen, UA 1.0  0.0 - 1.0 (mg/dL)   Nitrite NEGATIVE  NEGATIVE    Leukocytes, UA NEGATIVE  NEGATIVE   URINE MICROSCOPIC-ADD ON     Status: Abnormal   Collection Time   03/23/11  6:27 PM      Component Value Range   RBC / HPF 3-6  <3 (RBC/hpf)   Bacteria, UA FEW (*) RARE   CBC     Status: Abnormal   Collection Time   03/23/11  7:11 PM      Component Value Range   WBC 14.7 (*) 4.0 - 10.5 (K/uL)   RBC 2.06 (*) 4.22 - 5.81 (MIL/uL)   Hemoglobin 7.0 (*) 13.0 - 17.0 (g/dL)   HCT 16.1 (*) 09.6 -  52.0 (%)   MCV 94.2  78.0 - 100.0 (fL)   MCH 34.0  26.0 - 34.0 (pg)   MCHC 36.1 (*) 30.0 - 36.0 (g/dL)   RDW 16.1 (*) 09.6 - 15.5 (%)   Platelets 276  150 - 400 (K/uL)  CBC     Status: Abnormal   Collection Time   03/24/11  4:25 AM      Component Value Range   WBC 13.1 (*) 4.0 - 10.5 (K/uL)   RBC 2.11 (*) 4.22 - 5.81 (MIL/uL)   Hemoglobin 7.1 (*) 13.0 - 17.0 (g/dL)   HCT 04.5 (*) 40.9 - 52.0 (%)   MCV 94.3  78.0 - 100.0 (fL)   MCH 33.6  26.0 - 34.0 (pg)   MCHC 35.7  30.0 - 36.0 (g/dL)   RDW 81.1 (*) 91.4 - 15.5 (%)   Platelets 285  150 - 400 (K/uL)  COMPREHENSIVE  METABOLIC PANEL     Status: Abnormal   Collection Time   03/24/11  4:25 AM      Component Value Range   Sodium 138  135 - 145 (mEq/L)   Potassium 5.2 (*) 3.5 - 5.1 (mEq/L)   Chloride 111  96 - 112 (mEq/L)   CO2 22  19 - 32 (mEq/L)   Glucose, Bld 94  70 - 99 (mg/dL)   BUN 9  6 - 23 (mg/dL)   Creatinine, Ser 7.82  0.50 - 1.35 (mg/dL)   Calcium 8.4  8.4 - 95.6 (mg/dL)   Total Protein 7.1  6.0 - 8.3 (g/dL)   Albumin 2.8 (*) 3.5 - 5.2 (g/dL)   AST 213 (*) 0 - 37 (U/L)   ALT 42  0 - 53 (U/L)   Alkaline Phosphatase 131 (*) 39 - 117 (U/L)   Total Bilirubin 4.3 (*) 0.3 - 1.2 (mg/dL)   GFR calc non Af Amer >90  >90 (mL/min)   GFR calc Af Amer >90  >90 (mL/min)     Studies/Results: Dg Chest 2 View  03/23/2011  *RADIOLOGY REPORT*  Clinical Data: Shortness of breath; anterior chest pain and pain between shoulder blades.  History of sickle cell disease and congestive heart failure.  CHEST - 2 VIEW  Comparison: Chest radiograph performed 11/28/2010  Findings: Mild bilateral midlung airspace opacity is noted; this may reflect acute chest syndrome.  Mild pneumonia cannot be excluded.  Chronic vascular congestion is seen, without significant pulmonary edema.  No pleural effusion or pneumothorax is identified.  The heart is borderline enlarged.  No acute osseous abnormalities are identified.  IMPRESSION:  1.  Mild bilateral midlung airspace opacity may reflect acute chest syndrome; mild pneumonia cannot be excluded. 2.  Chronic vascular congestion and borderline cardiomegaly, without significant pulmonary edema.  Original Report Authenticated By: Tonia Ghent, M.D.   Ct Angio Chest W/cm &/or Wo Cm  03/23/2011  *RADIOLOGY REPORT*  Clinical Data: Chest pain for 5 hours; back pain, between the shoulder blades.  Leukocytosis.  CT ANGIOGRAPHY CHEST  Technique:  Multidetector CT imaging of the chest using the standard protocol during bolus administration of intravenous contrast. Multiplanar reconstructed images  including MIPs were obtained and reviewed to evaluate the vascular anatomy.  Contrast: OMNIPAQUE IOHEXOL 300 MG/ML IV SOLN  Comparison: Chest radiograph performed earlier today at 01:42 a.m., and CTA of the chest performed 03/29/2008  Findings: There is no evidence of central pulmonary embolus. Evaluation for pulmonary embolus is mildly suboptimal due to limitations in the timing of the contrast bolus; this may reflect  decreased cardiac function, as contrast appears to have progressed to the thoracic aorta.  There is diffuse prominence of the interstitium, particularly at the lower lung lobes; this may reflect minimal interstitial edema, or possibly underlying interstitial lung disease.  There is no definite evidence of pneumonia; there is no evidence of focal consolidation to suggest acute chest syndrome.  There is no evidence of pleural effusion or pneumothorax.  No masses are identified; no abnormal focal contrast enhancement is seen.  The heart is enlarged.  No pericardial effusion is identified.  The great vessels are unremarkable in appearance.  Scattered mildly enlarged periaortic, azygoesophageal recess and superior mediastinal nodes are seen, measuring up to 1.3 cm in size.  Smaller right paratracheal nodes are seen.  No axillary lymphadenopathy is seen.  The visualized portions of the thyroid gland are unremarkable in appearance.  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  The spleen is somewhat diminutive in appearance.  No acute osseous abnormalities are seen; mildly H-shaped vertebrae are noted at the mid thoracic spine.  IMPRESSION:  1.  No evidence of central pulmonary embolus; this may reflect decreased cardiac function. 2.  Diffuse prominence of the interstitium, particularly at the lower lung lobe; this may reflect minimal interstitial edema, or possibly  underlying interstitial lung disease.  No definite evidence of pneumonia or acute chest pain. 3.  Cardiomegaly noted. 4.  Mediastinal lymphadenopathy seen, with nodes measuring up to 1.3 cm in size. 5.  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  Findings were discussed with Dr. Eber Hong at 03:25 a.m. on 03/23/2011.  Original Report Authenticated By: Tonia Ghent, M.D.     Medications:   No Known Allergies   Current Facility-Administered Medications  Medication Dose Route Frequency Provider Last Rate Last Dose  . acetaminophen (TYLENOL) tablet 650 mg  650 mg Oral Q4H PRN Larina Bras, NP       Or  . acetaminophen (TYLENOL) suppository 650 mg  650 mg Rectal Q4H PRN Larina Bras, NP      . cefTRIAXone (ROCEPHIN) 1 g in dextrose 5 % 50 mL IVPB  1 g Intravenous Q24H Debby Crosley, MD   1 g at 03/24/11 0413  . dextrose 5 %-0.45 % sodium chloride infusion   Intravenous Continuous Larina Bras, NP      . diphenhydrAMINE (BENADRYL) injection 12.5-25 mg  12.5-25 mg Intravenous Q4H PRN Larina Bras, NP      . enoxaparin (LOVENOX) injection 40 mg  40 mg Subcutaneous Q24H Debby Crosley, MD   40 mg at 03/24/11 1017  . folic acid (FOLVITE) tablet 1 mg  1 mg Oral Daily Larina Bras, NP      . HYDROmorphone (DILAUDID) injection 2-4 mg  2-4 mg Intravenous Q3H Larina Bras, NP      . ibuprofen (ADVIL,MOTRIN) tablet 800 mg  800 mg Oral TID WC Larina Bras, NP      . levalbuterol (XOPENEX) nebulizer solution 0.63 mg  0.63 mg Nebulization Q6H Larina Bras, NP      . methadone (DOLOPHINE) tablet 10 mg  10 mg Oral TID Larina Bras, NP   10 mg at 03/24/11 1018  . ondansetron (ZOFRAN) injection 4 mg  4 mg Intravenous Q4H PRN Larina Bras, NP      . oxyCODONE (Oxy IR/ROXICODONE) immediate release tablet 10  mg  10 mg Oral Q4H Larina Bras, NP        . pantoprazole (PROTONIX) EC tablet 40 mg  40 mg Oral Daily Larina Bras, NP      . senna-docusate (Senokot-S) tablet 1 tablet  1 tablet Oral QHS Larina Bras, NP      . DISCONTD: 0.45 % sodium chloride infusion   Intravenous Continuous Willey Blade, MD      . DISCONTD: 0.9 % NaCl with KCl 20 mEq/ L  infusion   Intravenous Continuous Debby Crosley, MD 100 mL/hr at 03/24/11 0615    . DISCONTD: HYDROmorphone (DILAUDID) injection 2 mg  2 mg Intravenous Q4H PRN Debby Crosley, MD   2 mg at 03/24/11 1017  . DISCONTD: oxyCODONE (Oxy IR/ROXICODONE) immediate release tablet 5 mg  5 mg Oral Q4H PRN Gery Pray, MD        Assessment/Plan: Patient Active Problem List  Diagnoses  . Sickle cell anemia with crisis  . Gout  . Chronic pain  . Hypokalemia  . Leukocytosis  . Acute chest syndrome    Sickle Cell Crisis/Chronic Pain:  The patient will be having a blood transfusion today and when his PICC line is installed we will have a blood exchange.  The patient pain management regiment has been adjusted.  The patient will continue pain/pruritis/nausea/bowel management, IV hydration, home medications, GI and DVT prophylaxis.  CMP, CBC, TSH, B12, Ferritin, ProBNP, Hemoglobinopathy, Mg and Phos tomorrow in the am. Leukocytosis/Acute Chest Syndrome:  This may be an acute reaction due to sickle cell crisis.  The patient has been started on IV Rocephin and is receiving scheduled nebulizer treatments with the flutter valve for the next 48 hours.  IS usage is encouraged. Hypokalemia:  Resolved - Will continue to monitor   Discussed and agreed upon plan of care with the patient.   The plan of care will be adjusted based on the patient's clinical progress.   Larina Bras, NP-C 03/24/2011, 2:41 PM

## 2011-03-24 NOTE — Progress Notes (Signed)
Consent obtained for PICC insertion. Patient requests PICC to be placed in AM. Primary RN aware. Christeen Douglas RN [facility]

## 2011-03-24 NOTE — Progress Notes (Signed)
MEDICATION RELATED CONSULT NOTE - INITIAL   Pharmacy Consult for Hydroxyurea Indication: Sickle Cell Disease  No Known Allergies  Patient Measurements: Height: 5\' 11"  (180.3 cm) Weight: 172 lb 2.9 oz (78.1 kg) IBW/kg (Calculated) : 75.3   Vital Signs: Temp: 98.5 F (36.9 C) (02/18 1300) Temp src: Oral (02/18 1300) BP: 111/63 mmHg (02/18 1300) Pulse Rate: 71  (02/18 1300) Intake/Output from previous day: 02/17 0701 - 02/18 0700 In: 4122.5 [P.O.:1160; I.V.:2600; Blood:312.5; IV Piggyback:50] Out: 3325 [Urine:3325] Intake/Output from this shift: Total I/O In: 360 [P.O.:360] Out: 1000 [Urine:1000]  Labs:  Basename 03/24/11 0425 03/23/11 1911 03/23/11 0045  WBC 13.1* 14.7* 19.1*  HGB 7.1* 7.0* 6.5*  HCT 19.9* 19.4* 18.0*  PLT 285 276 342  APTT -- -- --  CREATININE 0.70 -- 0.88  LABCREA -- -- --  CREATININE 0.70 -- 0.88  CREAT24HRUR -- -- --  MG -- -- --  PHOS -- -- --  ALBUMIN 2.8* -- --  PROT 7.1 -- --  ALBUMIN 2.8* -- --  AST 110* -- --  ALT 42 -- --  ALKPHOS 131* -- --  BILITOT 4.3* -- --  BILIDIR -- -- --  IBILI -- -- --   Estimated Creatinine Clearance: 130.7 ml/min (by C-G formula based on Cr of 0.7).   Microbiology: Recent Results (from the past 720 hour(s))  CULTURE, BLOOD (ROUTINE X 2)     Status: Normal (Preliminary result)   Collection Time   03/23/11 12:45 AM      Component Value Range Status Comment   Specimen Description BLOOD LEFT ANTECUBITAL   Final    Special Requests BOTTLES DRAWN AEROBIC AND ANAEROBIC 5CC   Final    Culture  Setup Time [id number]   Final    Culture     Final    Value:        BLOOD CULTURE RECEIVED NO GROWTH TO DATE CULTURE WILL BE HELD FOR 5 DAYS BEFORE ISSUING A FINAL NEGATIVE REPORT   Report Status PENDING   Incomplete   CULTURE, BLOOD (ROUTINE X 2)     Status: Normal (Preliminary result)   Collection Time   03/23/11  1:16 AM      Component Value Range Status Comment   Specimen Description BLOOD RIGHT ANTECUBITAL    Final    Special Requests BOTTLES DRAWN AEROBIC AND ANAEROBIC   Final    Culture  Setup Time [id number]   Final    Culture     Final    Value:        BLOOD CULTURE RECEIVED NO GROWTH TO DATE CULTURE WILL BE HELD FOR 5 DAYS BEFORE ISSUING A FINAL NEGATIVE REPORT   Report Status PENDING   Incomplete     Medical History: Past Medical History  Diagnosis Date  . CHF (congestive heart failure)   . Gouty arthropathy   . Chronic fatigue syndrome   . Sickle cell anemia     Medications:  Scheduled:    . cefTRIAXone (ROCEPHIN)  IV  1 g Intravenous Q24H  . enoxaparin  40 mg Subcutaneous Q24H  . folic acid  1 mg Oral Daily  . HYDROmorphone  2-4 mg Intravenous Q3H  . ibuprofen  800 mg Oral TID WC  . levalbuterol  0.63 mg Nebulization Q6H  . methadone  10 mg Oral TID  . oxyCODONE  10 mg Oral Q4H  . pantoprazole  40 mg Oral Daily  . senna-docusate  1 tablet Oral QHS    Assessment:  To start hydroxyurea for sickle cell disease. Current labs are stable for initiation.  Goal of Therapy:  Follow appropriate labs  Plan:  Start hydroxyurea 15mg /kg/day (500mg  PO BID). Follow labs for holding parameters. Further dosage changes per Md   Berkley Harvey 03/24/2011,3:05 PM

## 2011-03-25 DIAGNOSIS — E875 Hyperkalemia: Secondary | ICD-10-CM

## 2011-03-25 HISTORY — DX: Hyperkalemia: E87.5

## 2011-03-25 LAB — COMPREHENSIVE METABOLIC PANEL
ALT: 40 U/L (ref 0–53)
AST: 95 U/L — ABNORMAL HIGH (ref 0–37)
Albumin: 2.9 g/dL — ABNORMAL LOW (ref 3.5–5.2)
CO2: 21 mEq/L (ref 19–32)
Chloride: 110 mEq/L (ref 96–112)
Creatinine, Ser: 0.8 mg/dL (ref 0.50–1.35)
GFR calc non Af Amer: >90 mL/min (ref >90)
Sodium: 137 mEq/L (ref 135–145)
Total Bilirubin: 3.8 mg/dL — ABNORMAL HIGH (ref 0.3–1.2)

## 2011-03-25 LAB — MAGNESIUM: Magnesium: 2.2 mg/dL (ref 1.5–2.5)

## 2011-03-25 LAB — CBC
MCV: 92.9 fL (ref 78.0–100.0)
Platelets: 307 10*3/uL (ref 150–400)
RBC: 2.54 MIL/uL — ABNORMAL LOW (ref 4.22–5.81)
RDW: 25 % — ABNORMAL HIGH (ref 11.5–15.5)
WBC: 12.1 10*3/uL — ABNORMAL HIGH (ref 4.0–10.5)

## 2011-03-25 LAB — PREPARE RBC (CROSSMATCH)

## 2011-03-25 LAB — FERRITIN: Ferritin: 3171 ng/mL — ABNORMAL HIGH (ref 22–322)

## 2011-03-25 LAB — PHOSPHORUS: Phosphorus: 5.3 mg/dL — ABNORMAL HIGH (ref 2.3–4.6)

## 2011-03-25 LAB — VITAMIN B12: Vitamin B-12: 662 pg/mL (ref 211–911)

## 2011-03-25 MED ORDER — SODIUM CHLORIDE 0.9 % IJ SOLN
10.0000 mL | INTRAMUSCULAR | Status: DC | PRN
  Administered 2011-03-25 – 2011-03-28 (×12): 10 mL

## 2011-03-25 MED ORDER — HYDROMORPHONE HCL PF 2 MG/ML IJ SOLN
2.0000 mg | INTRAMUSCULAR | Status: DC
  Administered 2011-03-25 (×4): 2 mg via INTRAVENOUS
  Administered 2011-03-25: 4 mg via INTRAVENOUS
  Administered 2011-03-26 (×5): 2 mg via INTRAVENOUS
  Administered 2011-03-26 (×2): 4 mg via INTRAVENOUS
  Administered 2011-03-26 – 2011-03-27 (×3): 2 mg via INTRAVENOUS
  Administered 2011-03-27: 4 mg via INTRAVENOUS
  Administered 2011-03-27: 2 mg via INTRAVENOUS
  Filled 2011-03-25: qty 1
  Filled 2011-03-25: qty 2
  Filled 2011-03-25 (×4): qty 1
  Filled 2011-03-25: qty 2
  Filled 2011-03-25: qty 1
  Filled 2011-03-25: qty 2
  Filled 2011-03-25 (×8): qty 1
  Filled 2011-03-25 (×2): qty 2

## 2011-03-25 MED ORDER — POLYVINYL ALCOHOL 1.4 % OP SOLN
1.0000 [drp] | OPHTHALMIC | Status: DC | PRN
  Administered 2011-03-26 – 2011-03-27 (×3): 1 [drp] via OPHTHALMIC
  Filled 2011-03-25: qty 15

## 2011-03-25 MED ORDER — FUROSEMIDE 10 MG/ML IJ SOLN
20.0000 mg | Freq: Once | INTRAMUSCULAR | Status: AC
  Administered 2011-03-25: 20 mg via INTRAVENOUS
  Filled 2011-03-25: qty 2

## 2011-03-25 MED ORDER — ALLOPURINOL 300 MG PO TABS
300.0000 mg | ORAL_TABLET | Freq: Every day | ORAL | Status: DC
  Administered 2011-03-25 – 2011-03-28 (×4): 300 mg via ORAL
  Filled 2011-03-25 (×4): qty 1

## 2011-03-25 MED ORDER — LEVALBUTEROL HCL 0.63 MG/3ML IN NEBU
0.6300 mg | INHALATION_SOLUTION | Freq: Four times a day (QID) | RESPIRATORY_TRACT | Status: DC | PRN
  Filled 2011-03-25: qty 3

## 2011-03-25 MED ORDER — DEXTROSE 5 % IV SOLN
2000.0000 mg | Freq: Every day | INTRAVENOUS | Status: DC
  Administered 2011-03-25 – 2011-03-27 (×3): 2000 mg via INTRAVENOUS
  Filled 2011-03-25 (×4): qty 2

## 2011-03-25 NOTE — Progress Notes (Signed)
Subjective: The patient was seen on rounds today. The patient was sitting quietly in his bed and watching television. The patient is complaining of pain 5/10 in his cervical area, between his shoulder blades and in his clavicular area. The patient has chronic pain in his right knee.  The patient has received his PICC line and tolerated the blood transfusion yesterday well.  The patient will receive a blood exchange today.   The patient state his medication was not quite lasting him 3 hours - let him know I will adjust his pain medication to Q2.  The patient stated that his chest pain has resolved but does have a chronic "fluttering" in his heart.  Let the patient know I will give him Lasix today and continue telemetry monitoring.  No other patient or nursing concerns.   Objective: Physical Examination: Vital signs in last 24 hours: Blood pressure 119/76, pulse 59, temperature 97.6 F (36.4 C), temperature source Oral, resp. rate 17, height 5\' 11"  (1.803 m), weight 173 lb 8 oz (78.7 kg), SpO2 97.00%.  General Appearance: Alert, cooperative, well developed, well nourished, moderate distress  Head: Normocephalic, without obvious abnormality, atraumatic  Eyes: PERRLA, EOMI, scleral icterus  Nose: Nares, septum and mucosa are normal, no drainage or sinus tenderness  Throat: Lips, mucosa, and tongue are normal; teeth and gums are normal  Neck: No adenopathy, supple, symmetrical, trachea midline, thyroid not enlarged, no masses or nodules, limited ROM due to cervical tenderness  Back: Symmetric, no curvature, normal ROM, no CVA tenderness  Resp: Diminished breath sounds bibasilar and bilaterally (LLL > RLL), CTA, no wheezes/rales/rhonchi  Cardio: Regular rate and rhythm, S1, S2 normal, no murmur, click, rub or gallop  GI: Soft, non-tender, slight distension, normal bowel sounds, no masses, no organomegaly  Male Genitalia: Deferred, the patient denies priapism  Extremities: Normal, atraumatic, no  cyanosis or edema, Homans sign is negative, no sign of DVT, RLE tenderness (knee area)  Pulses: 2+ and symmetric  Skin: Skin color, texture, turgor normal, no rashes or lesions, dry skin, tattoos  Neurologic: Grossly normal, no focal deficits, CN II - XII intact  Psych: Appropriate affect   Laboratories: Results for orders placed during the hospital encounter of 03/22/11 (from the past 24 hour(s))  PREPARE RBC (CROSSMATCH)     Status: Normal   Collection Time   03/24/11  3:00 PM      Component Value Range   Order Confirmation ORDER PROCESSED BY BLOOD BANK    MAGNESIUM     Status: Normal   Collection Time   03/25/11  4:15 AM      Component Value Range   Magnesium 2.2  1.5 - 2.5 (mg/dL)  PHOSPHORUS     Status: Abnormal   Collection Time   03/25/11  4:15 AM      Component Value Range   Phosphorus 5.3 (*) 2.3 - 4.6 (mg/dL)  FERRITIN     Status: Abnormal   Collection Time   03/25/11  4:15 AM      Component Value Range   Ferritin 3171 (*) 22 - 322 (ng/mL)  CBC     Status: Abnormal   Collection Time   03/25/11  4:15 AM      Component Value Range   WBC 12.1 (*) 4.0 - 10.5 (K/uL)   RBC 2.54 (*) 4.22 - 5.81 (MIL/uL)   Hemoglobin 8.4 (*) 13.0 - 17.0 (g/dL)   HCT 40.9 (*) 81.1 - 52.0 (%)   MCV 92.9  78.0 - 100.0 (fL)  MCH 33.1  26.0 - 34.0 (pg)   MCHC 35.6  30.0 - 36.0 (g/dL)   RDW 16.1 (*) 09.6 - 15.5 (%)   Platelets 307  150 - 400 (K/uL)  COMPREHENSIVE METABOLIC PANEL     Status: Abnormal   Collection Time   03/25/11  4:15 AM      Component Value Range   Sodium 137  135 - 145 (mEq/L)   Potassium 5.3 (*) 3.5 - 5.1 (mEq/L)   Chloride 110  96 - 112 (mEq/L)   CO2 21  19 - 32 (mEq/L)   Glucose, Bld 88  70 - 99 (mg/dL)   BUN 12  6 - 23 (mg/dL)   Creatinine, Ser 0.45  0.50 - 1.35 (mg/dL)   Calcium 8.3 (*) 8.4 - 10.5 (mg/dL)   Total Protein 7.2  6.0 - 8.3 (g/dL)   Albumin 2.9 (*) 3.5 - 5.2 (g/dL)   AST 95 (*) 0 - 37 (U/L)   ALT 40  0 - 53 (U/L)   Alkaline Phosphatase 132 (*) 39 -  117 (U/L)   Total Bilirubin 3.8 (*) 0.3 - 1.2 (mg/dL)   GFR calc non Af Amer >90  >90 (mL/min)   GFR calc Af Amer >90  >90 (mL/min)  PRO B NATRIURETIC PEPTIDE     Status: Abnormal   Collection Time   03/25/11  4:15 AM      Component Value Range   Pro B Natriuretic peptide (BNP) 140.5 (*) 0 - 125 (pg/mL)  VITAMIN B12     Status: Normal   Collection Time   03/25/11  4:15 AM      Component Value Range   Vitamin B-12 662  211 - 911 (pg/mL)  TSH     Status: Normal   Collection Time   03/25/11  4:15 AM      Component Value Range   TSH 3.152  0.350 - 4.500 (uIU/mL)  PREPARE RBC (CROSSMATCH)     Status: Normal   Collection Time   03/25/11 11:30 AM      Component Value Range   Order Confirmation ORDER PROCESSED BY BLOOD BANK       Medications:  No Known Allergies  Current Facility-Administered Medications  Medication Dose Route Frequency Provider Last Rate Last Dose  . acetaminophen (TYLENOL) tablet 650 mg  650 mg Oral Q4H PRN Larina Bras, NP       Or  . acetaminophen (TYLENOL) suppository 650 mg  650 mg Rectal Q4H PRN Larina Bras, NP      . allopurinol (ZYLOPRIM) tablet 300 mg  300 mg Oral Daily Larina Bras, NP      . cefTRIAXone (ROCEPHIN) 1 g in dextrose 5 % 50 mL IVPB  1 g Intravenous Q24H Debby Crosley, MD   1 g at 03/25/11 0435  . deferoxamine (DESFERAL) 2,000 mg in dextrose 5 % 500 mL infusion  2,000 mg Intravenous QHS Larina Bras, NP      . dextrose 5 %-0.45 % sodium chloride infusion   Intravenous Continuous Larina Bras, NP 75 mL/hr at 03/24/11 1702    . diphenhydrAMINE (BENADRYL) injection 12.5-25 mg  12.5-25 mg Intravenous Q4H PRN Larina Bras, NP      . enoxaparin (LOVENOX) injection 40 mg  40 mg Subcutaneous Q24H Debby Crosley, MD   40 mg at 03/25/11 1044  . folic acid (FOLVITE) tablet 1 mg  1 mg Oral Daily Larina Bras, NP   1 mg at 03/25/11 1043  . furosemide (LASIX) injection 20 mg  20 mg Intravenous Once Larina Bras, NP        . HYDROmorphone (DILAUDID) injection 2-4 mg  2-4 mg Intravenous Q2H Larina Bras, NP      . hydroxyurea (HYDREA) capsule 500 mg  500 mg Oral BID Berkley Harvey, PHARMD   500 mg at 03/25/11 1043  . ibuprofen (ADVIL,MOTRIN) tablet 800 mg  800 mg Oral TID WC Larina Bras, NP   800 mg at 03/25/11 0824  . levalbuterol (XOPENEX) nebulizer solution 0.63 mg  0.63 mg Nebulization Q6H PRN Willey Blade, MD      . methadone (DOLOPHINE) tablet 10 mg  10 mg Oral TID Larina Bras, NP   10 mg at 03/24/11 1708  . ondansetron (ZOFRAN) injection 4 mg  4 mg Intravenous Q4H PRN Larina Bras, NP      . oxyCODONE (Oxy IR/ROXICODONE) immediate release tablet 10 mg  10 mg Oral Q4H Larina Bras, NP   10 mg at 03/25/11 0824  . pantoprazole (PROTONIX) EC tablet 40 mg  40 mg Oral Daily Larina Bras, NP   40 mg at 03/25/11 1043  . polyvinyl alcohol (LIQUIFILM TEARS) 1.4 % ophthalmic solution 1 drop  1 drop Both Eyes PRN Larina Bras, NP      . senna-docusate (Senokot-S) tablet 1 tablet  1 tablet Oral QHS Larina Bras, NP      . sodium chloride 0.9 % injection 10-40 mL  10-40 mL Intracatheter PRN Willey Blade, MD        Studies/Results: No results found.  Assessment/Plan: Patient Active Problem List  Diagnoses  . Sickle cell anemia with crisis  . Gout  . Chronic pain  . Hypokalemia  . Leukocytosis  . Acute chest syndrome  . Hyperkalemia  . Hemochromatosis   Sickle Cell Crisis/Chronic Pain: The patient will have a blood exchange today - PICC has been inserted.  The patient's pain management regimen has been adjusted. The patient will continue pain/pruritis/nausea/bowel management, IV hydration, home medications, GI and DVT prophylaxis. CMP, CBC, ProBNP, Ammonia and Phos tomorrow am. Arnold Long is pending. Leukocytosis/Acute Chest Syndrome: WBCs are trending downward - This may be an acute reaction due to sickle cell crisis. The patient has been started on IV Rocephin.   The patient has  nebulizer treatments with the flutter valve available PRN.  IS usage is encouraged.  Hypokalemia: Resolved - Will continue to monitor  Hyperkalemia:  The patient will receive one dose of IV Lasix today - Will continue to monitor Gout:  No acute flare -  The patient is receiving his home medication of prophylactic Allopurinol daily Hemochromatosis:  The patient will receive Desferal infusions QHS  Discussed and agreed upon plan of care with the patient.   The plan of care will be adjusted based on the patient's clinical progress.   Larina Bras, NP-C 03/25/2011, 11:37 AM

## 2011-03-25 NOTE — Procedures (Signed)
Blood exchange 400 mL as ordered. Started at 1552 and ended at 1615. Pt. tolerated well. Total normal saline flush used was 80 mL. Pt. 's primary RN notified when exchange completed so the blood transfusion could start.

## 2011-03-26 LAB — CBC
HCT: 23.7 % — ABNORMAL LOW (ref 39.0–52.0)
MCHC: 35.9 g/dL (ref 30.0–36.0)
MCV: 92.6 fL (ref 78.0–100.0)
Platelets: 275 10*3/uL (ref 150–400)
RDW: 23.8 % — ABNORMAL HIGH (ref 11.5–15.5)

## 2011-03-26 LAB — AMMONIA: Ammonia: 55 umol/L (ref 11–60)

## 2011-03-26 LAB — TYPE AND SCREEN
Unit division: 0
Unit division: 0

## 2011-03-26 LAB — PRO B NATRIURETIC PEPTIDE: Pro B Natriuretic peptide (BNP): 95.5 pg/mL (ref 0–125)

## 2011-03-26 LAB — COMPREHENSIVE METABOLIC PANEL
AST: 75 U/L — ABNORMAL HIGH (ref 0–37)
Albumin: 2.9 g/dL — ABNORMAL LOW (ref 3.5–5.2)
BUN: 16 mg/dL (ref 6–23)
Calcium: 8.3 mg/dL — ABNORMAL LOW (ref 8.4–10.5)
Creatinine, Ser: 0.91 mg/dL (ref 0.50–1.35)
Total Protein: 7.1 g/dL (ref 6.0–8.3)

## 2011-03-26 NOTE — Progress Notes (Signed)
Subjective:  Patient is feeling much better after his exchange transfusion yesterday. He notes the pain is not as intense as it was. He denies chest pains or shortness of breath. His energy still not back to normal. He has been taking the IV Dilaudid. He did not take the by mouth methadone which he takes at home. No other new complaints. He presently rates his pain as a 3/10.   No Known Allergies Current Facility-Administered Medications  Medication Dose Route Frequency Provider Last Rate Last Dose  . acetaminophen (TYLENOL) tablet 650 mg  650 mg Oral Q4H PRN Larina Bras, NP       Or  . acetaminophen (TYLENOL) suppository 650 mg  650 mg Rectal Q4H PRN Larina Bras, NP      . allopurinol (ZYLOPRIM) tablet 300 mg  300 mg Oral Daily Larina Bras, NP   300 mg at 03/26/11 0949  . cefTRIAXone (ROCEPHIN) 1 g in dextrose 5 % 50 mL IVPB  1 g Intravenous Q24H Debby Crosley, MD   1 g at 03/26/11 0400  . deferoxamine (DESFERAL) 2,000 mg in dextrose 5 % 500 mL infusion  2,000 mg Intravenous QHS Larina Bras, NP 125 mL/hr at 03/25/11 2209 2,000 mg at 03/25/11 2209  . dextrose 5 %-0.45 % sodium chloride infusion   Intravenous Continuous Larina Bras, NP 75 mL/hr at 03/26/11 0500    . diphenhydrAMINE (BENADRYL) injection 12.5-25 mg  12.5-25 mg Intravenous Q4H PRN Larina Bras, NP      . enoxaparin (LOVENOX) injection 40 mg  40 mg Subcutaneous Q24H Debby Crosley, MD   40 mg at 03/26/11 0949  . folic acid (FOLVITE) tablet 1 mg  1 mg Oral Daily Larina Bras, NP   1 mg at 03/26/11 0949  . HYDROmorphone (DILAUDID) injection 2-4 mg  2-4 mg Intravenous Q2H Larina Bras, NP   2 mg at 03/26/11 1649  . hydroxyurea (HYDREA) capsule 500 mg  500 mg Oral BID Berkley Harvey, PHARMD   500 mg at 03/26/11 0865  . ibuprofen (ADVIL,MOTRIN) tablet 800 mg  800 mg Oral TID WC Larina Bras, NP   800 mg at 03/26/11 1649  . levalbuterol (XOPENEX) nebulizer solution 0.63 mg  0.63 mg  Nebulization Q6H PRN Willey Blade, MD      . methadone (DOLOPHINE) tablet 10 mg  10 mg Oral TID Larina Bras, NP   10 mg at 03/24/11 1708  . ondansetron (ZOFRAN) injection 4 mg  4 mg Intravenous Q4H PRN Larina Bras, NP      . oxyCODONE (Oxy IR/ROXICODONE) immediate release tablet 10 mg  10 mg Oral Q4H Larina Bras, NP   10 mg at 03/26/11 1649  . pantoprazole (PROTONIX) EC tablet 40 mg  40 mg Oral Daily Larina Bras, NP   40 mg at 03/26/11 0948  . polyvinyl alcohol (LIQUIFILM TEARS) 1.4 % ophthalmic solution 1 drop  1 drop Both Eyes PRN Larina Bras, NP   1 drop at 03/26/11 1650  . senna-docusate (Senokot-S) tablet 1 tablet  1 tablet Oral QHS Larina Bras, NP   1 tablet at 03/25/11 2210  . sodium chloride 0.9 % injection 10-40 mL  10-40 mL Intracatheter PRN Willey Blade, MD   10 mL at 03/26/11 0534    Objective: Blood pressure 122/63, pulse 79, temperature 98 F (36.7 C), temperature source Oral, resp. rate 18, height 5\' 11"  (1.803 m), weight 172 lb 6.4 oz (78.2 kg), SpO2 98.00%.  Well-developed well-nourished black male in no acute distress. HEENT: No sinus  tenderness. Muscular icterus. NECK: No posterior cervical nodes. LUNGS: Left basilar crackles. No vocal fremitus. CV: Normal S1, S2 without S3. ABD: No epigastric tenderness. Dullness to percussion in all quadrants. MSK: Negative Homans. NEURO: Intact.  Lab results: Results for orders placed during the hospital encounter of 03/22/11 (from the past 48 hour(s))  MAGNESIUM     Status: Normal   Collection Time   03/25/11  4:15 AM      Component Value Range Comment   Magnesium 2.2  1.5 - 2.5 (mg/dL)   PHOSPHORUS     Status: Abnormal   Collection Time   03/25/11  4:15 AM      Component Value Range Comment   Phosphorus 5.3 (*) 2.3 - 4.6 (mg/dL)   FERRITIN     Status: Abnormal   Collection Time   03/25/11  4:15 AM      Component Value Range Comment   Ferritin 3171 (*) 22 - 322 (ng/mL) Result confirmed by  automatic dilution.  CBC     Status: Abnormal   Collection Time   03/25/11  4:15 AM      Component Value Range Comment   WBC 12.1 (*) 4.0 - 10.5 (K/uL)    RBC 2.54 (*) 4.22 - 5.81 (MIL/uL)    Hemoglobin 8.4 (*) 13.0 - 17.0 (g/dL)    HCT 16.1 (*) 09.6 - 52.0 (%)    MCV 92.9  78.0 - 100.0 (fL)    MCH 33.1  26.0 - 34.0 (pg)    MCHC 35.6  30.0 - 36.0 (g/dL)    RDW 04.5 (*) 40.9 - 15.5 (%)    Platelets 307  150 - 400 (K/uL)   COMPREHENSIVE METABOLIC PANEL     Status: Abnormal   Collection Time   03/25/11  4:15 AM      Component Value Range Comment   Sodium 137  135 - 145 (mEq/L)    Potassium 5.3 (*) 3.5 - 5.1 (mEq/L)    Chloride 110  96 - 112 (mEq/L)    CO2 21  19 - 32 (mEq/L)    Glucose, Bld 88  70 - 99 (mg/dL)    BUN 12  6 - 23 (mg/dL)    Creatinine, Ser 8.11  0.50 - 1.35 (mg/dL)    Calcium 8.3 (*) 8.4 - 10.5 (mg/dL)    Total Protein 7.2  6.0 - 8.3 (g/dL)    Albumin 2.9 (*) 3.5 - 5.2 (g/dL)    AST 95 (*) 0 - 37 (U/L)    ALT 40  0 - 53 (U/L)    Alkaline Phosphatase 132 (*) 39 - 117 (U/L)    Total Bilirubin 3.8 (*) 0.3 - 1.2 (mg/dL)    GFR calc non Af Amer >90  >90 (mL/min)    GFR calc Af Amer >90  >90 (mL/min)   PRO B NATRIURETIC PEPTIDE     Status: Abnormal   Collection Time   03/25/11  4:15 AM      Component Value Range Comment   Pro B Natriuretic peptide (BNP) 140.5 (*) 0 - 125 (pg/mL)   VITAMIN B12     Status: Normal   Collection Time   03/25/11  4:15 AM      Component Value Range Comment   Vitamin B-12 662  211 - 911 (pg/mL)   TSH     Status: Normal   Collection Time   03/25/11  4:15 AM      Component Value Range Comment   TSH 3.152  0.350 -  4.500 (uIU/mL)   PREPARE RBC (CROSSMATCH)     Status: Normal   Collection Time   03/25/11 11:30 AM      Component Value Range Comment   Order Confirmation ORDER PROCESSED BY BLOOD BANK     CBC     Status: Abnormal   Collection Time   03/26/11  5:34 AM      Component Value Range Comment   WBC 11.3 (*) 4.0 - 10.5 (K/uL)    RBC  2.56 (*) 4.22 - 5.81 (MIL/uL)    Hemoglobin 8.5 (*) 13.0 - 17.0 (g/dL)    HCT 16.1 (*) 09.6 - 52.0 (%)    MCV 92.6  78.0 - 100.0 (fL)    MCH 33.2  26.0 - 34.0 (pg)    MCHC 35.9  30.0 - 36.0 (g/dL)    RDW 04.5 (*) 40.9 - 15.5 (%)    Platelets 275  150 - 400 (K/uL)   COMPREHENSIVE METABOLIC PANEL     Status: Abnormal   Collection Time   03/26/11  5:34 AM      Component Value Range Comment   Sodium 138  135 - 145 (mEq/L)    Potassium 5.5 (*) 3.5 - 5.1 (mEq/L)    Chloride 111  96 - 112 (mEq/L)    CO2 22  19 - 32 (mEq/L)    Glucose, Bld 93  70 - 99 (mg/dL)    BUN 16  6 - 23 (mg/dL)    Creatinine, Ser 8.11  0.50 - 1.35 (mg/dL)    Calcium 8.3 (*) 8.4 - 10.5 (mg/dL)    Total Protein 7.1  6.0 - 8.3 (g/dL)    Albumin 2.9 (*) 3.5 - 5.2 (g/dL)    AST 75 (*) 0 - 37 (U/L)    ALT 34  0 - 53 (U/L)    Alkaline Phosphatase 124 (*) 39 - 117 (U/L)    Total Bilirubin 3.0 (*) 0.3 - 1.2 (mg/dL)    GFR calc non Af Amer >90  >90 (mL/min)    GFR calc Af Amer >90  >90 (mL/min)   AMMONIA     Status: Normal   Collection Time   03/26/11  5:34 AM      Component Value Range Comment   Ammonia 55  11 - 60 (umol/L)   PRO B NATRIURETIC PEPTIDE     Status: Normal   Collection Time   03/26/11  5:34 AM      Component Value Range Comment   Pro B Natriuretic peptide (BNP) 95.5  0 - 125 (pg/mL)   PHOSPHORUS     Status: Abnormal   Collection Time   03/26/11  5:34 AM      Component Value Range Comment   Phosphorus 5.2 (*) 2.3 - 4.6 (mg/dL)     Studies/Results: No results found.  Patient Active Problem List  Diagnoses  . Sickle cell anemia with crisis  . Gout  . Chronic pain  . Hypokalemia  . Leukocytosis  . Acute chest syndrome  . Hyperkalemia  . Hemochromatosis    Impression: Sickle cell crisis gradually resolving. Sickle cell lung disease with acute chest syndrome improved. Hypokalemia resolved. Hemachromatosis. 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   Plan: Exchange transfusion in a.m. Repeat  hemoglobin electrophoresis. Anticipate discharge 24-48 hours.   [name], [name] 03/26/2011 5:27 PM

## 2011-03-27 LAB — CBC
HCT: 23 % — ABNORMAL LOW (ref 39.0–52.0)
MCH: 33.2 pg (ref 26.0–34.0)
MCV: 93.1 fL (ref 78.0–100.0)
Platelets: 264 10*3/uL (ref 150–400)
RBC: 2.47 MIL/uL — ABNORMAL LOW (ref 4.22–5.81)
WBC: 9.5 10*3/uL (ref 4.0–10.5)

## 2011-03-27 LAB — HEMOGLOBINOPATHY EVALUATION
Hemoglobin Other: 0 %
Hgb A2 Quant: 3.2 % (ref 2.2–3.2)
Hgb A: 23.2 % — ABNORMAL LOW (ref 96.8–97.8)
Hgb F Quant: 2.6 % — ABNORMAL HIGH (ref 0.0–2.0)

## 2011-03-27 LAB — COMPREHENSIVE METABOLIC PANEL
BUN: 14 mg/dL (ref 6–23)
CO2: 23 mEq/L (ref 19–32)
Calcium: 8.3 mg/dL — ABNORMAL LOW (ref 8.4–10.5)
Chloride: 108 mEq/L (ref 96–112)
Creatinine, Ser: 0.78 mg/dL (ref 0.50–1.35)
GFR calc Af Amer: >90 mL/min (ref >90)
GFR calc non Af Amer: >90 mL/min (ref >90)
Glucose, Bld: 93 mg/dL (ref 70–99)
Total Bilirubin: 2.7 mg/dL — ABNORMAL HIGH (ref 0.3–1.2)

## 2011-03-27 MED ORDER — HYDROMORPHONE HCL PF 2 MG/ML IJ SOLN
2.0000 mg | INTRAMUSCULAR | Status: DC | PRN
  Administered 2011-03-27: 2 mg via INTRAVENOUS
  Administered 2011-03-27: 4 mg via INTRAVENOUS
  Administered 2011-03-28: 2 mg via INTRAVENOUS
  Filled 2011-03-27: qty 1
  Filled 2011-03-27: qty 2
  Filled 2011-03-27: qty 1

## 2011-03-27 NOTE — Progress Notes (Signed)
Subjective: The patient was seen on rounds today. The patient was sitting quietly in his bed and watching television. The patient is complaining of pain 1-2/10 in his cervical area, between his shoulder blades, lower back pain and chronic pain in his right knee.  The patient is currently receiving a blood exchange.   The patient states that he will be ready to manage his pain at home tomorrow and will be ready to go home then.  The patient states that he has been not been asking for Dilaudid purposefully and has relied on PO medication primarily for pain management in anticipation of returning home.   The patient continues to use the IS often.   No other patient or nursing concerns.   Objective: Physical Examination: Vital signs in last 24 hours: Blood pressure 132/69, pulse 71, temperature 98.8 F (37.1 C), temperature source Oral, resp. rate 16, height 5\' 11"  (1.803 m), weight 172 lb 13.5 oz (78.4 kg), SpO2 93.00%.  General Appearance: Alert, cooperative, well developed, well nourished, mild distress  Head: Normocephalic, without obvious abnormality, atraumatic  Eyes: PERRLA, EOMI, scleral icterus  Nose: Nares, septum and mucosa are normal, no drainage or sinus tenderness  Throat: Lips, mucosa, and tongue are normal; teeth and gums are normal  Neck: No adenopathy, supple, symmetrical, trachea midline, thyroid not enlarged, no masses or nodules, increase cervical ROM Back: Symmetric, no curvature, normal ROM, no CVA tenderness  Resp: Diminished breath sounds bibasilar and bilaterally (LLL > RLL), CTA, no wheezes/rales/rhonchi  Cardio: Regular rate and rhythm, S1, S2 normal, no murmur, click, rub or gallop  GI: Soft, non-tender, slight distension, normal bowel sounds, no masses, no organomegaly  Male Genitalia: Deferred, the patient denies priapism  Extremities: Normal, atraumatic, no cyanosis or edema, Homans sign is negative, no sign of DVT, RLE tenderness (knee area)  Pulses: 2+ and symmetric   Skin: Skin color, texture, turgor normal, no rashes or lesions, dry skin, tattoos  Neurologic: Grossly normal, no focal deficits, CN II - XII intact  Psych: Appropriate affect   Laboratories: Results for orders placed during the hospital encounter of 03/22/11 (from the past 24 hour(s))  CBC     Status: Abnormal   Collection Time   03/27/11  5:50 AM      Component Value Range   WBC 9.5  4.0 - 10.5 (K/uL)   RBC 2.47 (*) 4.22 - 5.81 (MIL/uL)   Hemoglobin 8.2 (*) 13.0 - 17.0 (g/dL)   HCT 16.1 (*) 09.6 - 52.0 (%)   MCV 93.1  78.0 - 100.0 (fL)   MCH 33.2  26.0 - 34.0 (pg)   MCHC 35.7  30.0 - 36.0 (g/dL)   RDW 04.5 (*) 40.9 - 15.5 (%)   Platelets 264  150 - 400 (K/uL)  COMPREHENSIVE METABOLIC PANEL     Status: Abnormal   Collection Time   03/27/11  5:50 AM      Component Value Range   Sodium 134 (*) 135 - 145 (mEq/L)   Potassium 5.3 (*) 3.5 - 5.1 (mEq/L)   Chloride 108  96 - 112 (mEq/L)   CO2 23  19 - 32 (mEq/L)   Glucose, Bld 93  70 - 99 (mg/dL)   BUN 14  6 - 23 (mg/dL)   Creatinine, Ser 8.11  0.50 - 1.35 (mg/dL)   Calcium 8.3 (*) 8.4 - 10.5 (mg/dL)   Total Protein 7.2  6.0 - 8.3 (g/dL)   Albumin 2.8 (*) 3.5 - 5.2 (g/dL)   AST 70 (*)  0 - 37 (U/L)   ALT 31  0 - 53 (U/L)   Alkaline Phosphatase 119 (*) 39 - 117 (U/L)   Total Bilirubin 2.7 (*) 0.3 - 1.2 (mg/dL)   GFR calc non Af Amer >90  >90 (mL/min)   GFR calc Af Amer >90  >90 (mL/min)  TYPE AND SCREEN     Status: Normal (Preliminary result)   Collection Time   03/27/11 10:00 AM      Component Value Range   ABO/RH(D) A POS     Antibody Screen NEG     Sample Expiration 03/30/2011     Unit Number [id number]     Blood Component Type RED CELLS,LR     Unit division 00     Status of Unit ISSUED     Transfusion Status OK TO TRANSFUSE     Crossmatch Result Compatible       Medications:  No Known Allergies  Current Facility-Administered Medications  Medication Dose Route Frequency Provider Last Rate Last Dose  . acetaminophen  (TYLENOL) tablet 650 mg  650 mg Oral Q4H PRN Larina Bras, NP       Or  . acetaminophen (TYLENOL) suppository 650 mg  650 mg Rectal Q4H PRN Larina Bras, NP      . allopurinol (ZYLOPRIM) tablet 300 mg  300 mg Oral Daily Larina Bras, NP   300 mg at 03/27/11 1024  . cefTRIAXone (ROCEPHIN) 1 g in dextrose 5 % 50 mL IVPB  1 g Intravenous Q24H Debby Crosley, MD   1 g at 03/27/11 0545  . deferoxamine (DESFERAL) 2,000 mg in dextrose 5 % 500 mL infusion  2,000 mg Intravenous QHS Larina Bras, NP 125 mL/hr at 03/26/11 2159 2,000 mg at 03/26/11 2159  . dextrose 5 %-0.45 % sodium chloride infusion   Intravenous Continuous Larina Bras, NP 75 mL/hr at 03/27/11 0933    . diphenhydrAMINE (BENADRYL) injection 12.5-25 mg  12.5-25 mg Intravenous Q4H PRN Larina Bras, NP      . enoxaparin (LOVENOX) injection 40 mg  40 mg Subcutaneous Q24H Debby Crosley, MD   40 mg at 03/27/11 1023  . folic acid (FOLVITE) tablet 1 mg  1 mg Oral Daily Larina Bras, NP   1 mg at 03/27/11 1023  . HYDROmorphone (DILAUDID) injection 2-4 mg  2-4 mg Intravenous Q2H PRN Larina Bras, NP      . hydroxyurea (HYDREA) capsule 500 mg  500 mg Oral BID Berkley Harvey, PHARMD   500 mg at 03/27/11 1024  . ibuprofen (ADVIL,MOTRIN) tablet 800 mg  800 mg Oral TID WC Larina Bras, NP   800 mg at 03/27/11 0732  . levalbuterol (XOPENEX) nebulizer solution 0.63 mg  0.63 mg Nebulization Q6H PRN Willey Blade, MD      . methadone (DOLOPHINE) tablet 10 mg  10 mg Oral TID Larina Bras, NP   10 mg at 03/27/11 1024  . ondansetron (ZOFRAN) injection 4 mg  4 mg Intravenous Q4H PRN Larina Bras, NP      . oxyCODONE (Oxy IR/ROXICODONE) immediate release tablet 10 mg  10 mg Oral Q4H Larina Bras, NP   10 mg at 03/27/11 0732  . pantoprazole (PROTONIX) EC tablet 40 mg  40 mg Oral Daily Larina Bras, NP   40 mg at 03/27/11 1024  . polyvinyl alcohol (LIQUIFILM TEARS) 1.4 % ophthalmic solution 1 drop  1  drop Both Eyes PRN Larina Bras, NP   1 drop at 03/27/11 0239  . senna-docusate (Senokot-S) tablet 1 tablet  1 tablet Oral QHS Larina Bras, NP   1 tablet at 03/26/11 2159  . sodium chloride 0.9 % injection 10-40 mL  10-40 mL Intracatheter PRN Willey Blade, MD   10 mL at 03/27/11 1210  . DISCONTD: HYDROmorphone (DILAUDID) injection 2-4 mg  2-4 mg Intravenous Q2H Larina Bras, NP   2 mg at 03/27/11 1023    Studies/Results: No results found.  Assessment/Plan: Patient Active Problem List  Diagnoses  . Sickle cell anemia with crisis  . Gout  . Chronic pain  . Hypokalemia  . Leukocytosis  . Acute chest syndrome  . Hyperkalemia  . Hemochromatosis   Sickle Cell Crisis/Chronic Pain: The patient will have a blood exchange today.  The patient will continue pain/pruritis/nausea/bowel management, IV hydration, home medications, GI and DVT prophylaxis. CMP, CBC, ProBNP and Hemoglobinopathy tomorrow am. Leukocytosis/Acute Chest Syndrome: WBCs are WNL - The patient continues IV Rocephin.  The patient has  nebulizer treatments with the flutter valve available PRN.  IS usage is encouraged.  Hypokalemia: Resolved - Will continue to monitor  Hyperkalemia:  Potassium level is still slightly elevated - Will continue to monitor Gout:  No acute flare -  The patient is receiving his home medication of prophylactic Allopurinol daily Hemochromatosis:  The patient is receiving Desferal infusions QHS - Will check Ferritin level in the am.   Discussed and agreed upon plan of care with the patient.   The plan of care will be adjusted based on the patient's clinical progress. Working toward possible discharge within the next 24 hours.   Larina Bras, NP-C 03/27/2011, 3:28 PM

## 2011-03-28 LAB — CBC
HCT: 23.1 % — ABNORMAL LOW (ref 39.0–52.0)
MCV: 93.9 fL (ref 78.0–100.0)
RBC: 2.46 MIL/uL — ABNORMAL LOW (ref 4.22–5.81)
RDW: 21.5 % — ABNORMAL HIGH (ref 11.5–15.5)
WBC: 11.3 10*3/uL — ABNORMAL HIGH (ref 4.0–10.5)

## 2011-03-28 LAB — COMPREHENSIVE METABOLIC PANEL
AST: 76 U/L — ABNORMAL HIGH (ref 0–37)
Albumin: 2.9 g/dL — ABNORMAL LOW (ref 3.5–5.2)
Alkaline Phosphatase: 114 U/L (ref 39–117)
BUN: 16 mg/dL (ref 6–23)
CO2: 23 mEq/L (ref 19–32)
Chloride: 106 mEq/L (ref 96–112)
GFR calc non Af Amer: >90 mL/min (ref >90)
Potassium: 5 mEq/L (ref 3.5–5.1)
Total Bilirubin: 3.5 mg/dL — ABNORMAL HIGH (ref 0.3–1.2)

## 2011-03-28 LAB — TYPE AND SCREEN
ABO/RH(D): A POS
Antibody Screen: NEGATIVE
Unit division: 0

## 2011-03-28 LAB — FERRITIN: Ferritin: 3295 ng/mL — ABNORMAL HIGH (ref 22–322)

## 2011-03-28 MED ORDER — FOLIC ACID 1 MG PO TABS: 1.0000 mg | ORAL_TABLET | Freq: Every day | ORAL | Status: DC

## 2011-03-28 MED ORDER — OXYCODONE HCL 10 MG PO TABS: 10.0000 mg | ORAL_TABLET | ORAL | Status: AC

## 2011-03-28 NOTE — Discharge Summary (Signed)
Physician Discharge Summary  Patient ID: [name] MRN: [id number] DOB/AGE: 09/20/1970 41 y.o.  Admit date: 03/22/2011 Discharge date: 03/28/2011  Admission Diagnoses: Sickle cell anemia with crisis  Leukocytosis   Discharge Diagnoses:  Sickle cell anemia with crisis Gout Chronic pain Hypokalemia Leukocytosis Acute chest syndrome Hyperkalemia Hemochromatosis   Discharged Condition: Stable  Hospital Course:  Please refer to the detailed history and physical for information regarding the patient's admission.  Ins short, [name] is a pleasant 41 year old African-American male with known history of sickle cell anemia.  He presented to the ER and stated that he has chronic but his pain became significantly worse. The patient stated that his pain was located between the shoulder blades (where he states he never gets pain), mid chest pain and pleuritic chest pain with deep breathing. Upon hospitalization the patient received aggressive pain/nausea/puritis/bowel management, IV hydration, blood transfusions/exchanges, iron chelation therapy, IV antibiotic therapy, oxygen therapy, electrolyte repletion, and nebulizer treatments.  The patient ambulated on his own accord and used the IS diligently. The patient states that his chest/pleuritic pain has resolved.  The patient is motivated to return home/to work and states that he is ready to manage his pain at home.  The patient will be discharged to home in stable condition.  A return to full duty at work note was faxed to the phone number the patient provided.  The patient is discharged to home at this time.  Consults: None  Significant Diagnostic Studies:  Dg Chest 2 View 03/23/2011  *RADIOLOGY REPORT*  Clinical Data: Shortness of breath; anterior chest pain and pain between shoulder blades.  History of sickle cell disease and congestive heart failure.  CHEST - 2 VIEW  Comparison: Chest radiograph performed 11/28/2010  Findings: Mild  bilateral midlung airspace opacity is noted; this may reflect acute chest syndrome.  Mild pneumonia cannot be excluded.  Chronic vascular congestion is seen, without significant pulmonary edema.  No pleural effusion or pneumothorax is identified.  The heart is borderline enlarged.  No acute osseous abnormalities are identified.  IMPRESSION:  1.  Mild bilateral midlung airspace opacity may reflect acute chest syndrome; mild pneumonia cannot be excluded. 2.  Chronic vascular congestion and borderline cardiomegaly, without significant pulmonary edema.  Original Report Authenticated By: Tonia Ghent, M.D.   Ct Angio Chest W/cm &/or Wo Cm 03/23/2011  *RADIOLOGY REPORT*  Clinical Data: Chest pain for 5 hours; back pain, between the shoulder blades.  Leukocytosis.  CT ANGIOGRAPHY CHEST  Technique:  Multidetector CT imaging of the chest using the standard protocol during bolus administration of intravenous contrast. Multiplanar reconstructed images including MIPs were obtained and reviewed to evaluate the vascular anatomy.  Contrast: OMNIPAQUE IOHEXOL 300 MG/ML IV SOLN  Comparison: Chest radiograph performed earlier today at 01:42 a.m., and CTA of the chest performed 03/29/2008  Findings: There is no evidence of central pulmonary embolus. Evaluation for pulmonary embolus is mildly suboptimal due to limitations in the timing of the contrast bolus; this may reflect decreased cardiac function, as contrast appears to have progressed to the thoracic aorta.  There is diffuse prominence of the interstitium, particularly at the lower lung lobes; this may reflect minimal interstitial edema, or possibly underlying interstitial lung disease.  There is no definite evidence of pneumonia; there is no evidence of focal consolidation to suggest acute chest syndrome.  There is no evidence of pleural effusion or pneumothorax.  No masses are identified; no abnormal focal contrast enhancement is seen.  The heart is enlarged.  No  pericardial effusion is identified.  The great vessels are unremarkable in appearance.  Scattered mildly enlarged periaortic, azygoesophageal recess and superior mediastinal nodes are seen, measuring up to 1.3 cm in size.  Smaller right paratracheal nodes are seen.  No axillary lymphadenopathy is seen.  The visualized portions of the thyroid gland are unremarkable in appearance.  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  The spleen is somewhat diminutive in appearance.  No acute osseous abnormalities are seen; mildly H-shaped vertebrae are noted at the mid thoracic spine.  IMPRESSION:  1.  No evidence of central pulmonary embolus; this may reflect decreased cardiac function. 2.  Diffuse prominence of the interstitium, particularly at the lower lung lobe; this may reflect minimal interstitial edema, or possibly underlying interstitial lung disease.  No definite evidence of pneumonia or acute chest pain. 3.  Cardiomegaly noted. 4.  Mediastinal lymphadenopathy seen, with nodes measuring up to 1.3 cm in size. 5.  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  Findings were discussed with Dr. Eber Hong at 03:25 a.m. on 03/23/2011.  Original Report Authenticated By: Tonia Ghent, M.D.    Treatments:  IV hydration Antibiotics: Ceftriaxone Analgesia: Dilaudid Anticoagulation: Lovenox Respiratory therapy: O2 and Nebulizer treatments Procedures: PICC line Blood transfusion/exchange  Discharge Exam: Blood pressure 121/78, pulse 78, temperature 98.7 F (37.1 C), temperature source Oral, resp. rate 18, height 5\' 11"  (1.803 m), weight 172 lb 13.5 oz (78.4 kg), SpO2 93.00%.  General Appearance: Alert, cooperative, well developed, well nourished, mild distress    Head: Normocephalic, without obvious abnormality, atraumatic  Eyes: PERRLA, EOMI, scleral icterus  Nose: Nares, septum and mucosa are normal, no drainage or sinus tenderness  Throat: Lips, mucosa, and tongue are normal; teeth and gums are normal  Neck: No adenopathy, supple, symmetrical, trachea midline, thyroid not enlarged, no masses or nodules, increase cervical ROM  Back: Symmetric, no curvature, normal ROM, no CVA tenderness  Resp: Diminished breath sounds bibasilar and bilaterally (LLL > RLL), CTA, no wheezes/rales/rhonchi  Cardio: Regular rate and rhythm, S1, S2 normal, no murmur, click, rub or gallop  GI: Soft, non-tender, slight distension, normal bowel sounds, no masses, no organomegaly  Male Genitalia: Deferred, the patient denies priapism  Extremities: Normal, atraumatic, no cyanosis or edema, Homans sign is negative, no sign of DVT, RLE tenderness (knee area -chronic)  Pulses: 2+ and symmetric  Skin: Skin color, texture, turgor normal, no rashes or lesions, dry skin, tattoos  Neurologic: Grossly normal, no focal deficits, CN II - XII intact  Psych: Appropriate affect   Disposition: 01-Home or Self Care  Discharge Orders    Future Orders Please Complete By Expires   Diet - low sodium heart healthy      Increase activity slowly      Discharge instructions      Comments:   Take all medications as prescribed Keep warm, keep well hydrated and get plenty of rest Continue to use incentive spirometer daily Keep all follow-up appointments     Medication List  As of 03/28/2011  9:10 AM   TAKE these medications         allopurinol 300 MG tablet   Commonly known as: ZYLOPRIM   Take 300 mg by mouth daily.      folic acid 1 MG tablet   Commonly known as:  FOLVITE   Take 1 tablet (1 mg total) by mouth daily.      methadone 10 MG tablet   Commonly known as: DOLOPHINE   Take 10 mg by mouth 3 (three) times daily.      metoprolol succinate 25 MG 24 hr tablet   Commonly known  as: TOPROL-XL   Take 25 mg by mouth daily.      Oxycodone HCl 10 MG Tabs   Take 1 tablet (10 mg total) by mouth every 4 (four) hours.      zolpidem 10 MG tablet   Commonly known as: AMBIEN   Take 10 mg by mouth at bedtime as needed. For sleep.           Follow-up Information    Follow up with August Saucer, ERIC, MD. Schedule an appointment as soon as possible for a visit in 1 week. (Return to the ER if symptoms worsen)    Contact information:   [address], [address] [facility] [place] [address] [phone]         The entire discharge summary including medications and the importance of adherence to outpatient follow-up was discussed with the patient.  The patient stated he understood and agreed.   All of his questions were answered and concerns addressed.   Discharge Time:  Greater than 30 minutes  Signed: Larina Bras 03/28/2011, 9:10 AM

## 2011-03-29 LAB — CULTURE, BLOOD (ROUTINE X 2): Culture  Setup Time: 201302171110

## 2011-04-01 LAB — HEMOGLOBINOPATHY EVALUATION
Hemoglobin Other: 0 %
Hgb A2 Quant: 3.1 % (ref 2.2–3.2)

## 2011-04-17 ENCOUNTER — Other Ambulatory Visit: Payer: Self-pay | Admitting: Internal Medicine

## 2011-06-09 ENCOUNTER — Ambulatory Visit (HOSPITAL_COMMUNITY)
Admission: RE | Admit: 2011-06-09 | Discharge: 2011-06-09 | Disposition: A | Discharge status: Home / Self Care | Payer: BC Managed Care – PPO | Source: Ambulatory Visit | Attending: Internal Medicine | Admitting: Internal Medicine

## 2011-06-09 ENCOUNTER — Other Ambulatory Visit: Payer: Self-pay | Admitting: Internal Medicine

## 2011-06-09 DIAGNOSIS — M479 Spondylosis, unspecified: Secondary | ICD-10-CM

## 2011-06-09 DIAGNOSIS — M4120 Other idiopathic scoliosis, site unspecified: Secondary | ICD-10-CM | POA: Insufficient documentation

## 2011-06-09 DIAGNOSIS — M542 Cervicalgia: Secondary | ICD-10-CM | POA: Insufficient documentation

## 2011-06-20 ENCOUNTER — Non-Acute Institutional Stay (HOSPITAL_COMMUNITY)
Admission: AD | Admit: 2011-06-20 | Discharge: 2011-06-20 | Disposition: A | Discharge status: Home / Self Care | Payer: BC Managed Care – PPO | Attending: Internal Medicine | Admitting: Internal Medicine

## 2011-06-20 ENCOUNTER — Encounter: Payer: Self-pay | Admitting: Internal Medicine

## 2011-06-20 DIAGNOSIS — D65 Disseminated intravascular coagulation [defibrination syndrome]: Secondary | ICD-10-CM | POA: Insufficient documentation

## 2011-06-20 DIAGNOSIS — D5700 Hb-SS disease with crisis, unspecified: Secondary | ICD-10-CM | POA: Insufficient documentation

## 2011-06-20 DIAGNOSIS — Z79899 Other long term (current) drug therapy: Secondary | ICD-10-CM | POA: Insufficient documentation

## 2011-06-20 DIAGNOSIS — D72829 Elevated white blood cell count, unspecified: Secondary | ICD-10-CM | POA: Insufficient documentation

## 2011-06-20 DIAGNOSIS — I509 Heart failure, unspecified: Secondary | ICD-10-CM | POA: Insufficient documentation

## 2011-06-20 LAB — COMPREHENSIVE METABOLIC PANEL
AST: 170 U/L — ABNORMAL HIGH (ref 0–37)
BUN: 13 mg/dL (ref 6–23)
CO2: 24 mEq/L (ref 19–32)
Chloride: 104 mEq/L (ref 96–112)
Creatinine, Ser: 0.79 mg/dL (ref 0.50–1.35)
GFR calc Af Amer: >90 mL/min (ref >90)
GFR calc non Af Amer: >90 mL/min (ref >90)
Glucose, Bld: 100 mg/dL — ABNORMAL HIGH (ref 70–99)
Total Bilirubin: 8.5 mg/dL — ABNORMAL HIGH (ref 0.3–1.2)

## 2011-06-20 LAB — CBC
Hemoglobin: 7.2 g/dL — ABNORMAL LOW (ref 13.0–17.0)
Platelets: 331 10*3/uL (ref 150–400)
RBC: 2.23 MIL/uL — ABNORMAL LOW (ref 4.22–5.81)
WBC: 17.4 10*3/uL — ABNORMAL HIGH (ref 4.0–10.5)

## 2011-06-20 LAB — DIFFERENTIAL
Eosinophils Absolute: 0.7 10*3/uL (ref 0.0–0.7)
Eosinophils Relative: 4 % (ref 0–5)
Lymphs Abs: 5.9 10*3/uL — ABNORMAL HIGH (ref 0.7–4.0)
Monocytes Absolute: 1.7 10*3/uL — ABNORMAL HIGH (ref 0.1–1.0)
Monocytes Relative: 10 % (ref 3–12)
Neutro Abs: 9.1 10*3/uL — ABNORMAL HIGH (ref 1.7–7.7)
Neutrophils Relative %: 52 % (ref 43–77)
nRBC: 0 /100 WBC

## 2011-06-21 ENCOUNTER — Non-Acute Institutional Stay (HOSPITAL_COMMUNITY)
Admission: AD | Admit: 2011-06-21 | Discharge: 2011-06-21 | Disposition: A | Discharge status: Home / Self Care | Payer: BC Managed Care – PPO | Attending: Internal Medicine | Admitting: Internal Medicine

## 2011-06-21 ENCOUNTER — Other Ambulatory Visit (HOSPITAL_COMMUNITY): Payer: Self-pay

## 2011-06-21 DIAGNOSIS — M109 Gout, unspecified: Secondary | ICD-10-CM | POA: Insufficient documentation

## 2011-06-21 DIAGNOSIS — D5700 Hb-SS disease with crisis, unspecified: Secondary | ICD-10-CM

## 2011-06-21 MED ORDER — ONDANSETRON HCL 4 MG/2ML IJ SOLN: 4.0000 mg | INTRAMUSCULAR | Status: DC | PRN

## 2011-06-21 MED ORDER — ONDANSETRON HCL 4 MG PO TABS: 4.0000 mg | ORAL_TABLET | ORAL | Status: DC | PRN

## 2011-06-21 MED ORDER — DIPHENHYDRAMINE HCL 50 MG/ML IJ SOLN: 12.5000 mg | INTRAMUSCULAR | Status: DC | PRN

## 2011-06-21 MED ORDER — DIPHENHYDRAMINE HCL 25 MG PO CAPS
25.0000 mg | ORAL_CAPSULE | ORAL | Status: DC | PRN
  Administered 2011-06-21: 25 mg via ORAL
  Filled 2011-06-21: qty 1

## 2011-06-21 MED ORDER — ACETAMINOPHEN 325 MG PO TABS
650.0000 mg | ORAL_TABLET | Freq: Once | ORAL | Status: AC
  Administered 2011-06-21: 650 mg via ORAL
  Filled 2011-06-21: qty 2

## 2011-06-21 MED ORDER — FOLIC ACID 1 MG PO TABS
1.0000 mg | ORAL_TABLET | Freq: Every day | ORAL | Status: DC
  Administered 2011-06-21: 1 mg via ORAL
  Filled 2011-06-21: qty 1

## 2011-06-21 MED ORDER — HYDROMORPHONE HCL PF 2 MG/ML IJ SOLN
2.0000 mg | INTRAMUSCULAR | Status: DC | PRN
  Administered 2011-06-21: 4 mg via INTRAVENOUS
  Filled 2011-06-21: qty 2

## 2011-06-21 NOTE — Progress Notes (Signed)
Attempted to exchange 300 ml blood in between blood transfusions as per MD order, but was only able to remove 180 ml after outflow became more difficult to pull. Stopped further exchange then notified Dr. August Saucer by on page. Patient tolerated procedure well.

## 2011-06-21 NOTE — Progress Notes (Signed)
Subjective: The patient presented to the [facility] today for a scheduled blood exchange. The patient states anxiety related to receiving several unsuccessful attempts in trying to place a peripheral IV flared up his pain and he is now complaining of pain as high as a 5/10 but currently a 2/10 in his cervical area, between his shoulder blades, back pain and chronic bilateral knee pain.  The patient stated his pain was relieved with one dose of Dilaudid.  The patient is currently receiving a blood exchange and is tolerating the procedure well.  The patient denies any other symptomatology.  No other patient or nursing concerns.   Objective: Physical Examination: Blood pressure 120/72, pulse 88, temperature 98.2 F (36.8 C), temperature source Oral, resp. rate 18, SpO2 95.00%.  General Appearance: Alert, cooperative, well developed, well nourished, mild distress  Head: Normocephalic, without obvious abnormality, atraumatic  Eyes: PERRLA, EOMI, scleral icterus  Nose: Nares, septum and mucosa are normal, no drainage or sinus tenderness  Throat: Lips, mucosa, and tongue are normal; teeth and gums are normal  Neck: No adenopathy, supple, symmetrical, trachea midline, thyroid not enlarged, no nodules Back: Symmetric, normal ROM, mild CVA tenderness, diffuse tenderness  Resp: Diminished breath sounds bibasilar and bilaterally, CTA, no wheezes/rales/rhonchi  Cardio: Regular rate and rhythm, S1, S2 normal, no murmur, click, rub or gallop  GI: Soft, non-tender, slight distension, normal bowel sounds, no masses, no organomegaly  Male Genitalia: Deferred, the patient denies priapism  Extremities: Normal, atraumatic, no cyanosis or edema, Homans sign is negative, no sign of DVT, bilateral tenderness  Pulses: 2+ and symmetric  Skin: Skin color, texture, turgor normal, no rashes or lesions, dry skin, tattoos  Neurologic: Grossly normal, no focal deficits, CN II - XII intact  Psych: Appropriate affect    Laboratories: Results for orders placed during the hospital encounter of 06/20/11 (from the past 48 hour(s))  TYPE AND SCREEN     Status: Normal (Preliminary result)   Collection Time   06/20/11  7:55 AM      Component Value Range Comment   ABO/RH(D) A POS      Antibody Screen NEG      Sample Expiration 06/23/2011      Unit Number [id number]      Blood Component Type RED CELLS,LR      Unit division 00      Status of Unit ISSUED      Transfusion Status OK TO TRANSFUSE      Crossmatch Result Compatible      Unit Number [id number]      Blood Component Type RED CELLS,LR      Unit division 00      Status of Unit ISSUED      Transfusion Status OK TO TRANSFUSE      Crossmatch Result Compatible     CBC     Status: Abnormal   Collection Time   06/20/11  8:15 AM      Component Value Range Comment   WBC 17.4 (*) 4.0 - 10.5 (K/uL)    RBC 2.23 (*) 4.22 - 5.81 (MIL/uL)    Hemoglobin 7.2 (*) 13.0 - 17.0 (g/dL) REPEATED TO VERIFY   HCT 19.5 (*) 39.0 - 52.0 (%)    MCV 87.4  78.0 - 100.0 (fL)    MCH 32.3  26.0 - 34.0 (pg)    MCHC 36.9 (*) 30.0 - 36.0 (g/dL) SICKLE CELLS   RDW 01.0 (*) 11.5 - 15.5 (%)    Platelets 331  150 - 400 (  K/uL)   DIFFERENTIAL     Status: Abnormal   Collection Time   06/20/11  8:15 AM      Component Value Range Comment   Neutrophils Relative 52  43 - 77 (%)    Lymphocytes Relative 34  12 - 46 (%)    Monocytes Relative 10  3 - 12 (%)    Eosinophils Relative 4  0 - 5 (%)    Basophils Relative 0  0 - 1 (%)    Band Neutrophils 0  0 - 10 (%)    Metamyelocytes Relative 0      Myelocytes 0      Promyelocytes Absolute 0      Blasts 0      nRBC 0  0 (/100 WBC)    Neutro Abs 9.1 (*) 1.7 - 7.7 (K/uL)    Lymphs Abs 5.9 (*) 0.7 - 4.0 (K/uL)    Monocytes Absolute 1.7 (*) 0.1 - 1.0 (K/uL)    Eosinophils Absolute 0.7  0.0 - 0.7 (K/uL)    Basophils Absolute 0.0  0.0 - 0.1 (K/uL)    RBC Morphology RARE NRBCs      WBC Morphology WHITE COUNT CONFIRMED ON SMEAR      Smear  Review PLATELET COUNT CONFIRMED BY SMEAR   LARGE PLATELETS PRESENT  COMPREHENSIVE METABOLIC PANEL     Status: Abnormal   Collection Time   06/20/11  8:15 AM      Component Value Range Comment   Sodium 136  135 - 145 (mEq/L)    Potassium 4.7  3.5 - 5.1 (mEq/L)    Chloride 104  96 - 112 (mEq/L)    CO2 24  19 - 32 (mEq/L)    Glucose, Bld 100 (*) 70 - 99 (mg/dL)    BUN 13  6 - 23 (mg/dL)    Creatinine, Ser 2.13  0.50 - 1.35 (mg/dL)    Calcium 8.6  8.4 - 10.5 (mg/dL)    Total Protein 8.1  6.0 - 8.3 (g/dL)    Albumin 3.3 (*) 3.5 - 5.2 (g/dL)    AST 086 (*) 0 - 37 (U/L)    ALT 63 (*) 0 - 53 (U/L)    Alkaline Phosphatase 157 (*) 39 - 117 (U/L)    Total Bilirubin 8.5 (*) 0.3 - 1.2 (mg/dL)    GFR calc non Af Amer >90  >90 (mL/min)    GFR calc Af Amer >90  >90 (mL/min)   PREPARE RBC (CROSSMATCH)     Status: Normal   Collection Time   06/20/11  8:15 AM      Component Value Range Comment   Order Confirmation ORDER PROCESSED BY BLOOD BANK         Medications:  No Known Allergies  Current Facility-Administered Medications  Medication Dose Route Frequency Provider Last Rate Last Dose  . acetaminophen (TYLENOL) tablet 650 mg  650 mg Oral Once Gwenyth Bender, MD   650 mg at 06/21/11 1114  . diphenhydrAMINE (BENADRYL) capsule 25-50 mg  25-50 mg Oral Q4H PRN Gwenyth Bender, MD   25 mg at 06/21/11 1114   Or  . diphenhydrAMINE (BENADRYL) injection 12.5-25 mg  12.5-25 mg Intravenous Q4H PRN Gwenyth Bender, MD      . folic acid (FOLVITE) tablet 1 mg  1 mg Oral Daily Gwenyth Bender, MD   1 mg at 06/21/11 1114  . HYDROmorphone (DILAUDID) injection 2-4 mg  2-4 mg Intravenous Q2H PRN Gwenyth Bender, MD  4 mg at 06/21/11 1118  . ondansetron (ZOFRAN) tablet 4 mg  4 mg Oral Q4H PRN Gwenyth Bender, MD       Or  . ondansetron [facility]) injection 4 mg  4 mg Intravenous Q4H PRN Gwenyth Bender, MD        Studies/Results: Dg Cervical Spine Complete 06/09/2011  *RADIOLOGY REPORT*  Clinical Data: 41 year old male with persistent  neck pain.  CERVICAL SPINE - COMPLETE 4+ VIEW  Comparison: None.  Findings: Prevertebral soft tissue contours are within normal limits. Cervicothoracic junction alignment is within normal limits. Relatively preserved disc spaces. Bilateral posterior element alignment is within normal limits.  Scoliosis on the AP view in both the visualized cervical and upper thoracic spine.  C1-C2 alignment and odontoid process within normal limits.  Findings:  IMPRESSION:  No acute fracture or listhesis identified in the cervical spine.  Ligamentous injury is not excluded.  Scoliosis.  Original Report Authenticated By: Harley Hallmark, M.D.    Assessment/Plan: Patient Active Problem List  Diagnoses  . Sickle cell anemia with crisis  . Gout  . Chronic pain  . Hypokalemia  . Leukocytosis  . Acute chest syndrome  . Hyperkalemia  . Hemochromatosis   Sickle Cell Crisis/Chronic Pain: The patient will have a blood exchange today.  The patient will also receive pain/pruritis/nausea management and IV hydration.  Discussed and agreed upon plan of care with the patient.     Larina Bras, NP-C 06/21/2011, 2:31 PM

## 2011-06-22 LAB — TYPE AND SCREEN
ABO/RH(D): A POS
Antibody Screen: NEGATIVE
Unit division: 0

## 2011-07-07 NOTE — H&P (Signed)
Patient ID: [name], male   DOB: 10/07/1970, 41 y.o.   MRN: [id number]  Admission Date: 06/20/2011  HPI [name] is a 41 y.o. male.  Patient has hemoglobin SS disease with recent problems with persistent fatigue. He's not had a recent severe crisis pain. He comes in at this time for transfusions. Patient denies chest pains or palpitations. He continues to work as a Psychologist, occupational. He is been drinking adequate liquids. Patient does not smoke or drink.  Past Medical History  Diagnosis Date  . CHF (congestive heart failure)   . Gouty arthropathy   . Chronic fatigue syndrome   . Sickle cell anemia     Past Surgical History  Procedure Date  . Appendectomy   . Cholecystectomy   . Splenectomy     Family History  Problem Relation Age of Onset  . Sickle cell anemia      Social History History  Substance Use Topics  . Smoking status: Never Smoker   . Smokeless tobacco: Not on file  . Alcohol Use: No    No Known Allergies  Current Outpatient Prescriptions  Medication Sig Dispense Refill  . allopurinol (ZYLOPRIM) 300 MG tablet Take 300 mg by mouth daily.      . folic acid (FOLVITE) 1 MG tablet Take 1 tablet (1 mg total) by mouth daily.  60 tablet  1  . methadone (DOLOPHINE) 10 MG tablet Take 10 mg by mouth 3 (three) times daily.       . metoprolol succinate (TOPROL-XL) 25 MG 24 hr tablet Take 25 mg by mouth daily.      Marland Kitchen zolpidem (AMBIEN) 10 MG tablet Take 10 mg by mouth at bedtime as needed. For sleep.        Review of Systems: As noted above. Otherwise unremarkable.he has occasional pain in his left knee which he rates as a 3/10.  BP: 124/69, pulse 82, temp: 98.5, respirations: 18, O2 sats 95%.  Physical Exam: Well-developed well-nourished black male in no acute distress. HEENT: Head normocephalic atraumatic. Positive scleral icterus. No sinus tenderness. Fundi grade 1. TMs clear. Posterior pharynx clear. NECK: No enlarged thyroid. No posterior cervical nodes. LUNGS:  Clear to auscultation. No vocal fremitus. CV: Normal S1, S2 without S3. 1/6 systolic ejection murmur loudest in the left sternal border. ABDOMEN: No masses or tenderness. EXTREMITIES: Negative Homans. No edema. Minimal tenderness in left knee to passive range of motion. NEURO: Intact.  Data Reviewed  XBM:WUXLK count is 17,400, hemoglobin 7.2, hematocrit 19. Chemistry panel: Creatinine 0.79, total bilirubin 8.5. Albumin 3.3.  Assessment    Sickle cell crisis with active hemolysis. History of congestive heart failure. Leukocytosis secondary to #1    Plan    Transfusion of one unit of packed RBCs. Exchange transfusion of one unit of packed RBCs. Reevaluation for discharge thereafter.       Shamarie Call 07/07/2011, 8:05 AM

## 2011-07-14 NOTE — Discharge Summary (Signed)
[facility] Discharge Summary  Patient ID: [name] MRN: [id number] DOB/AGE: 07-31-1970 41 y.o.  Admit date: 06/21/2011 Discharge date: 06/21/2011  Admission Diagnoses: Sickle cell anemia with active hemolysis. Symptomatic anemia.  Discharge Diagnoses:  Sickle cell anemia with active hemolysis. Symptomatic anemia. Mild congestive heart failure. Sickle cell lung disease. History of acute chest syndrome. History of hyperuricemia. Secondary hemachromatosis.  Discharged Condition: good  Clinic Course: Patient was admitted to the sickle cell day unit for transfusion.  Consults: None  Significant Diagnostic Studies: CBC from 06/20/2011: Hemoglobin 7.2, hematocrit 19.5, WBC 17,400.  Treatments: Patient underwent an exchange transfusion of one unit packed RBCs. Patient also during this time received IV fluids for hydration as well as IV analgesia and medication for nausea. Patient tolerated the exchange very well. At the end of his transfusion he was stable for discharge home.  VITAL SIGNS: Blood pressure 124/69, pulse 82, temperature 98.5 F (36.9 C), temperature source Oral, resp. rate 18, SpO2 95.00%.  Disposition: 01-Home or Self Care  Discharge Orders    Future Orders Please Complete By Expires   Increase activity slowly      Discharge instructions      Comments:   Take all medications as prescribed Keep warm Drink plenty of water Rest Avoid stress Keep all follow up appointments   Call MD for:  severe uncontrolled pain         Medications at discharge : Allopurinol 300 mg daily, folic acid 1 mg daily, methadone 10 mg 3 times a day, metoprolol succinate 25 mg daily, zolpidem 10 mg each bedtime when necessary sleep.   SignedAugust Saucer, Yusif Gnau  07/01/2011  05:00 PM

## 2011-08-09 ENCOUNTER — Telehealth (HOSPITAL_COMMUNITY): Payer: Self-pay | Admitting: Hematology

## 2011-08-10 ENCOUNTER — Encounter (HOSPITAL_COMMUNITY): Payer: Self-pay

## 2011-08-10 ENCOUNTER — Inpatient Hospital Stay (HOSPITAL_COMMUNITY)
Admission: AD | Admit: 2011-08-10 | Discharge: 2011-08-14 | DRG: 395 | Disposition: A | Discharge status: Home / Self Care | Payer: BC Managed Care – PPO | Attending: Internal Medicine | Admitting: Internal Medicine

## 2011-08-10 DIAGNOSIS — D72829 Elevated white blood cell count, unspecified: Secondary | ICD-10-CM | POA: Diagnosis present

## 2011-08-10 DIAGNOSIS — I509 Heart failure, unspecified: Secondary | ICD-10-CM | POA: Diagnosis present

## 2011-08-10 DIAGNOSIS — G8929 Other chronic pain: Secondary | ICD-10-CM | POA: Diagnosis present

## 2011-08-10 DIAGNOSIS — D5700 Hb-SS disease with crisis, unspecified: Principal | ICD-10-CM | POA: Diagnosis present

## 2011-08-10 DIAGNOSIS — E875 Hyperkalemia: Secondary | ICD-10-CM | POA: Diagnosis present

## 2011-08-10 DIAGNOSIS — M109 Gout, unspecified: Secondary | ICD-10-CM | POA: Diagnosis present

## 2011-08-10 DIAGNOSIS — D5701 Hb-SS disease with acute chest syndrome: Secondary | ICD-10-CM | POA: Diagnosis present

## 2011-08-10 LAB — DIFFERENTIAL
Basophils Absolute: 0.2 10*3/uL — ABNORMAL HIGH (ref 0.0–0.1)
Eosinophils Absolute: 0.7 10*3/uL (ref 0.0–0.7)
Lymphocytes Relative: 36 % (ref 12–46)
Neutrophils Relative %: 50 % (ref 43–77)

## 2011-08-10 LAB — CBC
MCHC: 35.4 g/dL (ref 30.0–36.0)
RDW: 24.8 % — ABNORMAL HIGH (ref 11.5–15.5)

## 2011-08-10 LAB — COMPREHENSIVE METABOLIC PANEL
ALT: 56 U/L — ABNORMAL HIGH (ref 0–53)
Alkaline Phosphatase: 154 U/L — ABNORMAL HIGH (ref 39–117)
CO2: 21 mEq/L (ref 19–32)
Calcium: 8.5 mg/dL (ref 8.4–10.5)
GFR calc Af Amer: >90 mL/min (ref >90)
GFR calc non Af Amer: >90 mL/min (ref >90)
Glucose, Bld: 120 mg/dL — ABNORMAL HIGH (ref 70–99)
Sodium: 137 mEq/L (ref 135–145)

## 2011-08-10 LAB — PREPARE RBC (CROSSMATCH)

## 2011-08-10 MED ORDER — ONDANSETRON HCL 4 MG PO TABS: 4.0000 mg | ORAL_TABLET | ORAL | Status: DC | PRN

## 2011-08-10 MED ORDER — FOLIC ACID 1 MG PO TABS: 1.0000 mg | ORAL_TABLET | Freq: Every day | ORAL | Status: DC

## 2011-08-10 MED ORDER — DEXTROSE-NACL 5-0.45 % IV SOLN
INTRAVENOUS | Status: DC
  Administered 2011-08-10 – 2011-08-13 (×5): via INTRAVENOUS

## 2011-08-10 MED ORDER — ALLOPURINOL 300 MG PO TABS
300.0000 mg | ORAL_TABLET | Freq: Every day | ORAL | Status: DC
  Administered 2011-08-10 – 2011-08-14 (×5): 300 mg via ORAL
  Filled 2011-08-10 (×7): qty 1

## 2011-08-10 MED ORDER — METHADONE HCL 10 MG PO TABS
10.0000 mg | ORAL_TABLET | Freq: Three times a day (TID) | ORAL | Status: DC
  Administered 2011-08-10 – 2011-08-14 (×11): 10 mg via ORAL
  Filled 2011-08-10 (×12): qty 1

## 2011-08-10 MED ORDER — DIPHENHYDRAMINE HCL 50 MG/ML IJ SOLN
12.5000 mg | INTRAMUSCULAR | Status: DC | PRN
  Administered 2011-08-10 – 2011-08-13 (×2): 12.5 mg via INTRAVENOUS
  Administered 2011-08-13: 25 mg via INTRAVENOUS
  Filled 2011-08-10 (×3): qty 1

## 2011-08-10 MED ORDER — ONDANSETRON HCL 4 MG/2ML IJ SOLN
4.0000 mg | INTRAMUSCULAR | Status: DC | PRN
  Administered 2011-08-10: 4 mg via INTRAVENOUS
  Filled 2011-08-10: qty 2

## 2011-08-10 MED ORDER — METOPROLOL SUCCINATE ER 25 MG PO TB24
25.0000 mg | ORAL_TABLET | Freq: Every day | ORAL | Status: DC
  Administered 2011-08-10 – 2011-08-14 (×5): 25 mg via ORAL
  Filled 2011-08-10 (×5): qty 1

## 2011-08-10 MED ORDER — FOLIC ACID 1 MG PO TABS
1.0000 mg | ORAL_TABLET | Freq: Every day | ORAL | Status: DC
  Administered 2011-08-11 – 2011-08-14 (×4): 1 mg via ORAL
  Filled 2011-08-10 (×4): qty 1

## 2011-08-10 MED ORDER — FOLIC ACID 1 MG PO TABS
1.0000 mg | ORAL_TABLET | Freq: Every day | ORAL | Status: DC
  Administered 2011-08-10: 1 mg via ORAL
  Filled 2011-08-10: qty 1

## 2011-08-10 MED ORDER — HYDROMORPHONE HCL PF 2 MG/ML IJ SOLN
2.0000 mg | INTRAMUSCULAR | Status: DC | PRN
  Administered 2011-08-10 – 2011-08-13 (×24): 2 mg via INTRAVENOUS
  Filled 2011-08-10: qty 1
  Filled 2011-08-10: qty 2
  Filled 2011-08-10 (×7): qty 1
  Filled 2011-08-10: qty 2
  Filled 2011-08-10 (×16): qty 1

## 2011-08-10 MED ORDER — HYDROMORPHONE HCL PF 2 MG/ML IJ SOLN
2.0000 mg | INTRAMUSCULAR | Status: DC | PRN
  Administered 2011-08-10: 4 mg via INTRAVENOUS
  Filled 2011-08-10: qty 2

## 2011-08-10 MED ORDER — DIPHENHYDRAMINE HCL 25 MG PO CAPS: 25.0000 mg | ORAL_CAPSULE | ORAL | Status: DC | PRN

## 2011-08-10 NOTE — Progress Notes (Unsigned)
Patient ID: [name], male   DOB: Jul 20, 1970, 41 y.o.   MRN: [id number] Pt entered center today at 1500 with c/o Sickle Cell Pain.  Pt called yesterday stating that he would be in the center today at 0700. It is noted that the last time patient was here, he was a difficult IV stick.  IV team  Had a difficult time establishing IV access on pt at last visit.  Pt  Instructed to report to ED for evaluation for pain control.

## 2011-08-10 NOTE — H&P (Signed)
Avishai Reihl is an 41 y.o. male.   Chief Complaint: Generalized body ache.  HPI: 41 years old male with sickle cell disease has generalized body ache with fatigue. No fever. No nausea or vomiting.  Past Medical History  Diagnosis Date  . CHF (congestive heart failure)   . Gouty arthropathy   . Chronic fatigue syndrome   . Sickle cell anemia       Past Surgical History  Procedure Date  . Appendectomy   . Cholecystectomy   . Splenectomy     Family History  Problem Relation Age of Onset  . Sickle cell anemia     Social History:  reports that he has never smoked. He does not have any smokeless tobacco history on file. He reports that he does not drink alcohol or use illicit drugs.  Allergies: No Known Allergies  Medications Prior to Admission  Medication Sig Dispense Refill  . allopurinol (ZYLOPRIM) 300 MG tablet Take 300 mg by mouth daily.      . folic acid (FOLVITE) 1 MG tablet Take 1 tablet (1 mg total) by mouth daily.  60 tablet  1  . methadone (DOLOPHINE) 10 MG tablet Take 10 mg by mouth 3 (three) times daily.       . metoprolol succinate (TOPROL-XL) 25 MG 24 hr tablet Take 25 mg by mouth daily.      Marland Kitchen zolpidem (AMBIEN) 10 MG tablet Take 10 mg by mouth at bedtime as needed. For sleep.        Results for orders placed during the hospital encounter of 08/10/11 (from the past 48 hour(s))  CBC     Status: Abnormal   Collection Time   08/10/11  4:00 PM      Component Value Range Comment   WBC 16.6 (*) 4.0 - 10.5 K/uL    RBC 2.10 (*) 4.22 - 5.81 MIL/uL    Hemoglobin 6.8 (*) 13.0 - 17.0 g/dL    HCT 40.9 (*) 81.1 - 52.0 %    MCV 91.4  78.0 - 100.0 fL    MCH 32.4  26.0 - 34.0 pg    MCHC 35.4  30.0 - 36.0 g/dL    RDW 91.4 (*) 78.2 - 15.5 %    Platelets 331  150 - 400 K/uL   DIFFERENTIAL     Status: Normal (Preliminary result)   Collection Time   08/10/11  4:00 PM      Component Value Range Comment   Neutrophils Relative PENDING  43 - 77 %    Neutro Abs PENDING  1.7 - 7.7  K/uL    Band Neutrophils PENDING  0 - 10 %    Lymphocytes Relative PENDING  12 - 46 %    Lymphs Abs PENDING  0.7 - 4.0 K/uL    Monocytes Relative PENDING  3 - 12 %    Monocytes Absolute PENDING  0.1 - 1.0 K/uL    Eosinophils Relative PENDING  0 - 5 %    Eosinophils Absolute PENDING  0.0 - 0.7 K/uL    Basophils Relative PENDING  0 - 1 %    Basophils Absolute PENDING  0.0 - 0.1 K/uL    WBC Morphology PENDING      RBC Morphology PENDING      Smear Review PENDING      nRBC PENDING  0 /100 WBC    Metamyelocytes Relative PENDING      Myelocytes PENDING      Promyelocytes Absolute PENDING  Blasts PENDING      No results found.  @ROS @ No weight gain or loss, No GI bleed, No hemoptysis, Wears glasses + Gout, + irregular heart rate. No stroke, seizures or psych admission.  Blood pressure 132/71, pulse 112, temperature 98.9 F (37.2 C), temperature source Oral, resp. rate 20, SpO2 92.00%. Physical Exam:  Well-developed well-nourished black male in mild acute distress.  HEENT: Head normocephalic atraumatic. [employer], PERL, wears glasses. Positive scleral icterus. No sinus tenderness. TMs clear. Posterior pharynx clear.  NECK: No enlarged thyroid. No posterior cervical nodes. No JVD.  LUNGS: Clear to auscultation. No vocal fremitus.  CV: Normal S1, S2 without S3. 1/6 systolic ejection murmur loudest in the left sternal border.  ABDOMEN: No masses or tenderness.  EXTREMITIES: Negative Homans. No edema. Minimal tenderness in left knee to passive range of motion.  NEURO: Intact.   Assessment/Plan Sickle cell anemia with crisis Gout Leukocytosis  IV hydration Transfuse PRBC IV analgesics  Jordell Outten S 08/10/2011, 4:23 PM

## 2011-08-10 NOTE — Progress Notes (Signed)
Pt discussion revealed he was suppose to only have one unit of PRBC's at this time per his prior discussion with MD. Phone call to Dr. Algie Coffer. Transfuse order for 2 units of PRBC's in Epic.  Verified order should only be for 1 unit of PR BC  transfusion at this time. RN verified twice verbally and original order was modified.

## 2011-08-10 NOTE — Progress Notes (Unsigned)
  Subjective:    Patient ID: [name], male    DOB: 11-23-1970, 41 y.o.   MRN: [id number]  HPI    Review of Systems     Objective:   Physical Exam        Assessment & Plan:  Pt  Called center after leaving, stating he was upset that we could not see him. Explained to pt that we could treat him today, however, we may have to be admitted to hospital if pain is not under control due to the fact that center closed today around 1730  Or 1800.  Pt stated that he would  Return to center. Will contact on call MD for  Orders.

## 2011-08-11 ENCOUNTER — Inpatient Hospital Stay (HOSPITAL_COMMUNITY): Payer: BC Managed Care – PPO

## 2011-08-11 LAB — URINALYSIS, DIPSTICK ONLY
Nitrite: NEGATIVE
Specific Gravity, Urine: 1.015 (ref 1.005–1.030)
Urobilinogen, UA: 2 mg/dL — ABNORMAL HIGH (ref 0.0–1.0)
pH: 5.5 (ref 5.0–8.0)

## 2011-08-11 LAB — COMPREHENSIVE METABOLIC PANEL
Albumin: 2.9 g/dL — ABNORMAL LOW (ref 3.5–5.2)
Alkaline Phosphatase: 136 U/L — ABNORMAL HIGH (ref 39–117)
BUN: 14 mg/dL (ref 6–23)
Chloride: 109 mEq/L (ref 96–112)
Creatinine, Ser: 1.08 mg/dL (ref 0.50–1.35)
GFR calc Af Amer: >90 mL/min (ref >90)
GFR calc non Af Amer: 84 mL/min — ABNORMAL LOW (ref >90)
Glucose, Bld: 98 mg/dL (ref 70–99)
Potassium: 5 mEq/L (ref 3.5–5.1)
Total Bilirubin: 5.6 mg/dL — ABNORMAL HIGH (ref 0.3–1.2)

## 2011-08-11 LAB — CBC
HCT: 19.3 % — ABNORMAL LOW (ref 39.0–52.0)
Hemoglobin: 7 g/dL — ABNORMAL LOW (ref 13.0–17.0)
MCV: 89.8 fL (ref 78.0–100.0)
RDW: 23.5 % — ABNORMAL HIGH (ref 11.5–15.5)
WBC: 19.1 10*3/uL — ABNORMAL HIGH (ref 4.0–10.5)

## 2011-08-11 MED ORDER — MOXIFLOXACIN HCL IN NACL 400 MG/250ML IV SOLN
400.0000 mg | INTRAVENOUS | Status: DC
  Administered 2011-08-11: 400 mg via INTRAVENOUS
  Filled 2011-08-11 (×2): qty 250

## 2011-08-11 NOTE — Progress Notes (Signed)
UR done. 

## 2011-08-11 NOTE — Progress Notes (Signed)
Subjective: [name] was seen on rounds today. He is sitting up in bed in mild distress pain is primarily in bilaterals knees and hips and back. Rates pain 7/10.   Objective: Vital signs in last 24 hours: Filed Vitals:   08/11/11 0129 08/11/11 0533 08/11/11 1000 08/11/11 1030  BP: 118/68 105/56 105/61 105/56  Pulse: 75 82 77 82  Temp: 98.6 F (37 C) 98.3 F (36.8 C) 98.1 F (36.7 C)   TempSrc: Oral Oral Oral   Resp: 20 16 18    Height:      Weight:  81.012 kg (178 lb 9.6 oz)    SpO2: 91% 93% 93%    Weight change:   Intake/Output Summary (Last 24 hours) at 08/11/11 1410 Last data filed at 08/11/11 1003  Gross per 24 hour  Intake 2431.66 ml  Output      0 ml  Net 2431.66 ml    Physical Exam:  General Appearance: Alert, cooperative, well developed, well nourished, mild distress  Head: Normocephalic, without obvious abnormality, atraumatic  Eyes: PERRLA, EOMI, scleral icterus  Nose: Nares, septum and mucosa are normal, no drainage or sinus tenderness  Throat: Lips, mucosa, and tongue are normal; teeth and gums are normal  Neck: No adenopathy, supple, symmetrical, trachea midline, thyroid not enlarged, no nodules  Back: Symmetric, normal ROM, mild CVA tenderness, diffuse tenderness  Resp: Diminished breath sounds bibasilar and bilaterally, CTA, no wheezes/rales/rhonchi  Cardio: Regular rate and rhythm, S1, S2 normal, no murmur, click, rub or gallop  GI: Soft, non-tender, slight distension, normal bowel sounds, no masses, no organomegaly  Male Genitalia: Deferred, the patient denies priapism  Extremities: Normal, atraumatic, no cyanosis or edema, Homans sign is negative, no sign of DVT, bilateral tenderness  Pulses: 2+ and symmetric  Skin: Skin color, texture, turgor normal, no rashes or lesions, dry skin, (1) On left upper back tattoos  Neurologic: Grossly normal, no focal deficits, CN II - XII intact  Psych: Appropriate affect   Lab Results:  Basename 08/11/11 0344  08/10/11 1600  NA 138 137  K 5.0 4.7  CL 109 106  CO2 22 21  GLUCOSE 98 120*  BUN 14 13  CREATININE 1.08 1.02  CALCIUM 8.6 8.5  MG -- --  PHOS -- --    Basename 08/11/11 0344 08/10/11 1600  AST 102* 118*  ALT 44 56*  ALKPHOS 136* 154*  BILITOT 5.6* 6.5*  PROT 7.0 7.8  ALBUMIN 2.9* 3.3*   No results found for this basename: LIPASE:2,AMYLASE:2 in the last 72 hours  Basename 08/11/11 0344 08/10/11 1600  WBC 19.1* 16.6*  NEUTROABS -- 8.2*  HGB 7.0* 6.8*  HCT 19.3* 19.2*  MCV 89.8 91.4  PLT 319 331   No results found for this basename: CKTOTAL:3,CKMB:3,CKMBINDEX:3,TROPONINI:3 in the last 72 hours No components found with this basename: POCBNP:3 No results found for this basename: DDIMER:2 in the last 72 hours No results found for this basename: HGBA1C:2 in the last 72 hours No results found for this basename: CHOL:2,HDL:2,LDLCALC:2,TRIG:2,CHOLHDL:2,LDLDIRECT:2 in the last 72 hours No results found for this basename: TSH,T4TOTAL,FREET3,T3FREE,THYROIDAB in the last 72 hours No results found for this basename: VITAMINB12:2,FOLATE:2,FERRITIN:2,TIBC:2,IRON:2,RETICCTPCT:2 in the last 72 hours  Micro Results: No results found for this or any previous visit (from the past 240 hour(s)).  Studies/Results: Dg Chest 2 View  08/11/2011  *RADIOLOGY REPORT*  Clinical Data: Sickle cell pain, history of CHF  CHEST - 2 VIEW  Comparison: 03/23/2011; 11/28/2010; chest CT - 03/23/2011  Findings: Grossly unchanged  borderline enlarged cardiac silhouette and mediastinal contours.  Minimal increased conspicuity of the pulmonary interstitium bilaterally with relative area of consolidation within the right mid lung.  There is persistent blunting of the bilateral costophrenic angles, right greater than left,without definite pleural effusion.  No pneumothorax. Unchanged bones.  IMPRESSION: Chronic interstitial thickening with possible area of superimposed consolidation within the right mid lung, possibly  atelectasis though developing infection may have a similar appearance. Continued attention on follow-up is recommended.  Original Report Authenticated By: Waynard Reeds, M.D.    Medications: Scheduled Meds:   . allopurinol  300 mg Oral Daily  . folic acid  1 mg Oral Daily  . methadone  10 mg Oral TID  . metoprolol succinate  25 mg Oral Daily  . DISCONTD: folic acid  1 mg Oral Daily  . DISCONTD: folic acid  1 mg Oral Daily   Continuous Infusions:   . dextrose 5 % and 0.45% NaCl 125 mL/hr at 08/10/11 1620   PRN Meds:.diphenhydrAMINE, diphenhydrAMINE, HYDROmorphone (DILAUDID) injection, ondansetron (ZOFRAN) IV, ondansetron, DISCONTD:  HYDROmorphone (DILAUDID) injection  Assessment/Plan: Active Problems:  Sickle Cell Crisis/Chronic Pain:  Receiving pain/pruritis/nausea management and IV hydration. DVT prophylaxis  Leucocytosis Acute chest syndrome v/s respiratory infection : Avelox IV initiated  Gout : allopurinol    LOS: 1 day  EDWARDS, MICHELLE P 08/11/2011, 2:10 PM

## 2011-08-11 NOTE — Progress Notes (Signed)
Agree with above as noted. He will benefit from exchange transfusions.

## 2011-08-11 NOTE — Progress Notes (Signed)
Page to Dr. Algie Coffer. Notified of HGB 7.0 this AM. Reported he will wait for Dr. August Saucer to round on pt this AM. No new orders.

## 2011-08-11 NOTE — Progress Notes (Signed)
pT COMPLAINING OF PAIN IN BILATERAL KNEES, medicated with Dilaudid 2mg  IV, Rated pain a #8 and it came down to a #3 after he was given the pain medicine. Appetite good . Urine is dark amber. Resting comfortably in bed.

## 2011-08-11 NOTE — Progress Notes (Signed)
Pt to Radiology for chest xray this AM.

## 2011-08-11 NOTE — Plan of Care (Signed)
Problem: Phase I Progression Outcomes Goal: Pain controlled with appropriate interventions Outcome: Progressing Dilaudid 2 mg gives him relief.

## 2011-08-12 LAB — CBC
HCT: 18.1 % — ABNORMAL LOW (ref 39.0–52.0)
HCT: 18.5 % — ABNORMAL LOW (ref 39.0–52.0)
Hemoglobin: 6.6 g/dL — CL (ref 13.0–17.0)
MCH: 31.9 pg (ref 26.0–34.0)
MCHC: 36.5 g/dL — ABNORMAL HIGH (ref 30.0–36.0)
MCV: 87.4 fL (ref 78.0–100.0)
MCV: 86.4 fL (ref 78.0–100.0)
Platelets: 298 10*3/uL (ref 150–400)
RBC: 2.14 MIL/uL — ABNORMAL LOW (ref 4.22–5.81)
RDW: 23 % — ABNORMAL HIGH (ref 11.5–15.5)
RDW: 22.6 % — ABNORMAL HIGH (ref 11.5–15.5)
WBC: 14.9 10*3/uL — ABNORMAL HIGH (ref 4.0–10.5)

## 2011-08-12 LAB — COMPREHENSIVE METABOLIC PANEL
Albumin: 2.8 g/dL — ABNORMAL LOW (ref 3.5–5.2)
Alkaline Phosphatase: 132 U/L — ABNORMAL HIGH (ref 39–117)
BUN: 14 mg/dL (ref 6–23)
Creatinine, Ser: 0.92 mg/dL (ref 0.50–1.35)
GFR calc Af Amer: >90 mL/min (ref >90)
Glucose, Bld: 98 mg/dL (ref 70–99)
Potassium: 5.6 mEq/L — ABNORMAL HIGH (ref 3.5–5.1)
Total Protein: 6.8 g/dL (ref 6.0–8.3)

## 2011-08-12 MED ORDER — DEXTROSE 5 % IV SOLN
2000.0000 mg | Freq: Every day | INTRAVENOUS | Status: DC
  Administered 2011-08-12 – 2011-08-14 (×2): 2000 mg via INTRAVENOUS
  Filled 2011-08-12 (×3): qty 2

## 2011-08-12 MED ORDER — MOXIFLOXACIN HCL 400 MG PO TABS
400.0000 mg | ORAL_TABLET | Freq: Every day | ORAL | Status: DC
  Administered 2011-08-12 – 2011-08-13 (×2): 400 mg via ORAL
  Filled 2011-08-12 (×3): qty 1

## 2011-08-12 NOTE — Progress Notes (Signed)
CRITICAL VALUE ALERT  Critical value received:  Hemoglobin 6.6  Date of notification:  08/12/11  Time of notification:  1110  Critical value read back:yes  Nurse who received alert:  Janyce Llanos  MD notified (1st page):  Sherlyn Hay NP  Time of first page:  1114  MD notified (2nd page):  Time of second page:  Responding MD:  Sherlyn Hay NP  Time MD responded:  [phone]

## 2011-08-12 NOTE — Progress Notes (Cosign Needed)
Subjective: [name] was seen on rounds today. He is sitting up in bed in mild distress pain is primarily in bilaterals knees and hips and back. Continues to Rates pain 7/10. Remains tired discussed not quite ready for d/c would benefit from a blood transfusion and ketolation.    Objective: Vital signs in last 24 hours: Filed Vitals:   08/12/11 0215 08/12/11 0642 08/12/11 0920 08/12/11 0921  BP: 113/64 105/64 114/63 114/63  Pulse: 77 73 77 77  Temp: 98.2 F (36.8 C) 98.1 F (36.7 C) 98.7 F (37.1 C)   TempSrc: Oral Oral Oral   Resp: 16 16 18    Height:      Weight:  86.41 kg (190 lb 8 oz)    SpO2: 94% 94% 93%    Weight change: 6.214 kg (13 lb 11.2 oz)  Intake/Output Summary (Last 24 hours) at 08/12/11 1202 Last data filed at 08/12/11 0920  Gross per 24 hour  Intake 4461.25 ml  Output   3240 ml  Net 1221.25 ml    Physical Exam:  General Appearance: Alert, cooperative, well developed, well nourished, mild distress  Head: Normocephalic, without obvious abnormality, atraumatic  Eyes: PERRLA, EOMI, scleral icterus (glasses) Nose: Nares, septum and mucosa are normal, no drainage or sinus tenderness  Throat: Lips, mucosa, and tongue are normal; teeth and gums are normal  Neck: No adenopathy, supple, symmetrical, trachea midline, thyroid not enlarged, no nodules  Back: Symmetric, normal ROM, mild CVA tenderness, diffuse tenderness  Resp: Diminished breath sounds bibasilar and bilaterally, CTA, no wheezes/rales/rhonchi  Cardio: Regular rate and rhythm, S1, S2 normal, no murmur, click, rub or gallop  GI: Soft, non-tender, slight distension, normal bowel sounds, no masses, no organomegaly  Male Genitalia: Deferred, the patient denies priapism  Extremities: Normal, atraumatic, no cyanosis or edema,  no sign of DVT, bilateral tenderness esp knees Pulses: 2+ and symmetric  Skin: Skin color, texture, turgor normal, no rashes or lesions, dry skin, (1) On left upper back tattoos    Neurologic: Grossly normal, no focal deficits, CN II - XII intact  Psych: Appropriate affect   Lab Results:  Basename 08/12/11 1035 08/11/11 0344  NA 135 138  K 5.6* 5.0  CL 108 109  CO2 23 22  GLUCOSE 98 98  BUN 14 14  CREATININE 0.92 1.08  CALCIUM 8.4 8.6  MG -- --  PHOS -- --    Basename 08/12/11 1035 08/11/11 0344  AST 97* 102*  ALT 44 44  ALKPHOS 132* 136*  BILITOT 4.2* 5.6*  PROT 6.8 7.0  ALBUMIN 2.8* 2.9*   No results found for this basename: LIPASE:2,AMYLASE:2 in the last 72 hours  Basename 08/12/11 1035 08/11/11 0344 08/10/11 1600  WBC 13.0* 19.1* --  NEUTROABS -- -- 8.2*  HGB 6.6* 7.0* --  HCT 18.1* 19.3* --  MCV 87.4 89.8 --  PLT 275 319 --   No results found for this basename: CKTOTAL:3,CKMB:3,CKMBINDEX:3,TROPONINI:3 in the last 72 hours No components found with this basename: POCBNP:3 No results found for this basename: DDIMER:2 in the last 72 hours No results found for this basename: HGBA1C:2 in the last 72 hours No results found for this basename: CHOL:2,HDL:2,LDLCALC:2,TRIG:2,CHOLHDL:2,LDLDIRECT:2 in the last 72 hours No results found for this basename: TSH,T4TOTAL,FREET3,T3FREE,THYROIDAB in the last 72 hours No results found for this basename: VITAMINB12:2,FOLATE:2,FERRITIN:2,TIBC:2,IRON:2,RETICCTPCT:2 in the last 72 hours  Micro Results: No results found for this or any previous visit (from the past 240 hour(s)).  Studies/Results: Dg Chest 2 View  08/11/2011  *  RADIOLOGY REPORT*  Clinical Data: Sickle cell pain, history of CHF  CHEST - 2 VIEW  Comparison: 03/23/2011; 11/28/2010; chest CT - 03/23/2011  Findings: Grossly unchanged borderline enlarged cardiac silhouette and mediastinal contours.  Minimal increased conspicuity of the pulmonary interstitium bilaterally with relative area of consolidation within the right mid lung.  There is persistent blunting of the bilateral costophrenic angles, right greater than left,without definite pleural effusion.   No pneumothorax. Unchanged bones.  IMPRESSION: Chronic interstitial thickening with possible area of superimposed consolidation within the right mid lung, possibly atelectasis though developing infection may have a similar appearance. Continued attention on follow-up is recommended.  Original Report Authenticated By: Waynard Reeds, M.D.    Medications: Scheduled Meds:    . allopurinol  300 mg Oral Daily  . deferoxamine (DESFERAL) IV  2,000 mg Intravenous QHS  . folic acid  1 mg Oral Daily  . methadone  10 mg Oral TID  . metoprolol succinate  25 mg Oral Daily  . moxifloxacin  400 mg Intravenous Q24H   Continuous Infusions:    . dextrose 5 % and 0.45% NaCl 125 mL/hr at 08/12/11 1133   PRN Meds:.diphenhydrAMINE, diphenhydrAMINE, HYDROmorphone (DILAUDID) injection, ondansetron (ZOFRAN) IV, ondansetron  Assessment/Plan: Active Problems:  Sickle Cell Crisis/Chronic Pain: (active hemolysis ) Receiving pain/pruritis/nausea management and IV hydration. DVT prophylaxis (type/cross for transfusion)  Leucocytosis Acute chest syndrome v/s respiratory infection : Avelox IV repeat CBC Gout : allopurinol  Anemia r/o occult blood (FOBT) type cross transfuse 2 units PRBC  Hyperkalemia : monitor review factors to effect K+ none found  If continues upward will give Kayexalate tomorrow    LOS: 2 days  Arbor Leer P 08/12/2011, 12:02 PM

## 2011-08-12 NOTE — Progress Notes (Signed)
PHARMACIST - PHYSICIAN COMMUNICATION DR:  Dean CONCERNING: Antibiotic IV to Oral Route Change Policy  RECOMMENDATION: This patient is receiving Avelox by the intravenous route.  Based on criteria approved by the Pharmacy and Therapeutics Committee, the antibiotic(s) is/are being converted to the equivalent oral dose form(s).   DESCRIPTION: These criteria include:  Patient being treated for a respiratory tract infection, urinary tract infection, or cellulitis  The patient is not neutropenic and does not exhibit a GI malabsorption state  The patient is eating (either orally or via tube) and/or has been taking other orally administered medications for a least 24 hours  The patient is improving clinically and has a Tmax < 100.5  If you have questions about this conversion, please contact the Pharmacy Department  []  ( [phone] )  [place] []  ( [phone] )  [place]  []  ( [phone] )  [facility] [x]  ( [phone] )  [facility]     

## 2011-08-12 NOTE — Progress Notes (Signed)
CRITICAL VALUE ALERT  Critical value received:  Hemoglobin 6.8  Date of notification:  08/12/11  Time of notification:  1425  Critical value read back:yes  Nurse who received alert:  Hulda Marin  MD notified (1st page):  Dr. Willey Blade  Time of first page:  1450  MD notified (2nd page):  Time of second page:  Responding MD:  Dr.Eric August Saucer  Time MD responded:  *1452*

## 2011-08-13 LAB — COMPREHENSIVE METABOLIC PANEL
ALT: 41 U/L (ref 0–53)
Alkaline Phosphatase: 137 U/L — ABNORMAL HIGH (ref 39–117)
BUN: 17 mg/dL (ref 6–23)
CO2: 24 mEq/L (ref 19–32)
GFR calc Af Amer: >90 mL/min (ref >90)
GFR calc non Af Amer: >90 mL/min (ref >90)
Glucose, Bld: 94 mg/dL (ref 70–99)
Potassium: 5.4 mEq/L — ABNORMAL HIGH (ref 3.5–5.1)
Sodium: 134 mEq/L — ABNORMAL LOW (ref 135–145)

## 2011-08-13 LAB — CBC
HCT: 18.3 % — ABNORMAL LOW (ref 39.0–52.0)
Hemoglobin: 6.7 g/dL — CL (ref 13.0–17.0)
MCH: 31.5 pg (ref 26.0–34.0)
MCHC: 36.5 g/dL — ABNORMAL HIGH (ref 30.0–36.0)
Platelets: 317 10*3/uL (ref 150–400)
RBC: 2.13 MIL/uL — ABNORMAL LOW (ref 4.22–5.81)
RDW: 21.6 % — ABNORMAL HIGH (ref 11.5–15.5)
WBC: 16.1 10*3/uL — ABNORMAL HIGH (ref 4.0–10.5)

## 2011-08-13 MED ORDER — SODIUM POLYSTYRENE SULFONATE 15 GM/60ML PO SUSP
30.0000 g | Freq: Once | ORAL | Status: DC
  Filled 2011-08-13: qty 120

## 2011-08-13 MED ORDER — HYDROMORPHONE HCL PF 2 MG/ML IJ SOLN
2.0000 mg | INTRAMUSCULAR | Status: DC | PRN
  Administered 2011-08-14 (×3): 2 mg via INTRAVENOUS
  Filled 2011-08-13 (×3): qty 1

## 2011-08-13 NOTE — Progress Notes (Signed)
Subjective: Mr. Kiraly was seen on rounds today. He is lying  in his bed in mild distress pain is primarily in bilaterals knees and hips and back. Continues to Rates pain 7/10. manage his pain he reluctantly will remain hospitalized. He will receive an blood transfusion today.   Objective: Vital signs in last 24 hours: Filed Vitals:   08/13/11 1346 08/13/11 1447 08/13/11 1545 08/13/11 1603  BP: 102/55 121/53 114/57 116/58  Pulse: 70 76 76 78  Temp: 98.5 F (36.9 C) 98.4 F (36.9 C) 98.3 F (36.8 C) 98.4 F (36.9 C)  TempSrc: Oral Oral Oral Oral  Resp: 16 15 16 16   Height:      Weight:      SpO2: 92% 94%     Weight change: -0.499 kg (-1 lb 1.6 oz)  Intake/Output Summary (Last 24 hours) at 08/13/11 1745 Last data filed at 08/13/11 1710  Gross per 24 hour  Intake 2792.5 ml  Output   4175 ml  Net -1382.5 ml    Physical Exam:  General Appearance: Alert, cooperative, well developed, well nourished, mild distress  Head: Normocephalic, without obvious abnormality, atraumatic  Eyes: PERRLA, EOMI, scleral icterus (glasses) Nose: Nares, septum and mucosa are normal, no drainage or sinus tenderness  Throat: Lips, mucosa, and tongue are normal; teeth and gums are normal  Neck: No adenopathy, supple, symmetrical, trachea midline, thyroid not enlarged, no nodules  Back: Symmetric, normal ROM, mild CVA tenderness, diffuse tenderness  Resp: Diminished breath sounds bibasilar and bilaterally, CTA, no wheezes/rales/rhonchi  Cardio: Regular rate and rhythm, S1, S2 normal, no murmur, click, rub or gallop  GI: Soft, non-tender, slight distension, normal bowel sounds, no masses, no organomegaly  Male Genitalia: Deferred, the patient denies priapism  Extremities: Normal, atraumatic, no cyanosis or edema,  no sign of DVT, bilateral tenderness esp knees Pulses: 2+ and symmetric  Skin: Skin color, texture, turgor normal, no rashes or lesions, dry skin, (1) On left upper back tattoos  Neurologic:  Grossly normal, no focal deficits, CN II - XII intact  Psych: Appropriate affect   Lab Results:  [facility] 08/13/11 0637 08/12/11 1035  NA 134* 135  K 5.4* 5.6*  CL 106 108  CO2 24 23  GLUCOSE 94 98  BUN 17 14  CREATININE 0.90 0.92  CALCIUM 8.2* 8.4  MG -- --  PHOS -- --    Basename 08/13/11 0637 08/12/11 1035  AST 91* 97*  ALT 41 44  ALKPHOS 137* 132*  BILITOT 5.0* 4.2*  PROT 7.0 6.8  ALBUMIN 2.9* 2.8*   No results found for this basename: LIPASE:2,AMYLASE:2 in the last 72 hours  Basename 08/13/11 0637 08/12/11 1200  WBC 14.2* 14.9*  NEUTROABS -- --  HGB 6.7* 6.8*  HCT 18.3* 18.5*  MCV 85.9 86.4  PLT 339 298   No results found for this basename: CKTOTAL:3,CKMB:3,CKMBINDEX:3,TROPONINI:3 in the last 72 hours No components found with this basename: POCBNP:3 No results found for this basename: DDIMER:2 in the last 72 hours No results found for this basename: HGBA1C:2 in the last 72 hours No results found for this basename: CHOL:2,HDL:2,LDLCALC:2,TRIG:2,CHOLHDL:2,LDLDIRECT:2 in the last 72 hours No results found for this basename: TSH,T4TOTAL,FREET3,T3FREE,THYROIDAB in the last 72 hours No results found for this basename: VITAMINB12:2,FOLATE:2,FERRITIN:2,TIBC:2,IRON:2,RETICCTPCT:2 in the last 72 hours  Micro Results: No results found for this or any previous visit (from the past 240 hour(s)).  Studies/Results: No results found.  Medications: Scheduled Meds:    . allopurinol  300 mg Oral Daily  .  deferoxamine (DESFERAL) IV  2,000 mg Intravenous QHS  . folic acid  1 mg Oral Daily  . methadone  10 mg Oral TID  . metoprolol succinate  25 mg Oral Daily  . moxifloxacin  400 mg Oral Q2000   Continuous Infusions:    . dextrose 5 % and 0.45% NaCl 125 mL/hr at 08/13/11 0847   PRN Meds:.diphenhydrAMINE, diphenhydrAMINE, HYDROmorphone (DILAUDID) injection, ondansetron (ZOFRAN) IV, ondansetron  Assessment/Plan: Active Problems:  Sickle Cell Crisis/Chronic Pain:  (active hemolysis ) Receiving pain/pruritis/nausea management and IV hydration. DVT prophylaxis   Leucocytosis Acute chest syndrome v/s respiratory infection : Avelox IV repeat CBC Gout : allopurinol  Anemia r/o occult blood (FOBT) type cross tolerated 1 units of  Hyperkalemia : continue to monitor  will give Kayexalate    LOS: 3 days  Tichina Koebel P 08/13/2011, 5:45 PM

## 2011-08-14 ENCOUNTER — Inpatient Hospital Stay (HOSPITAL_COMMUNITY): Payer: BC Managed Care – PPO

## 2011-08-14 LAB — TYPE AND SCREEN
Unit division: 0
Unit division: 0

## 2011-08-14 LAB — CBC
MCH: 30.1 pg (ref 26.0–34.0)
Platelets: 329 10*3/uL (ref 150–400)
RBC: 2.92 MIL/uL — ABNORMAL LOW (ref 4.22–5.81)
WBC: 13.9 10*3/uL — ABNORMAL HIGH (ref 4.0–10.5)

## 2011-08-14 LAB — COMPREHENSIVE METABOLIC PANEL
ALT: 42 U/L (ref 0–53)
AST: 103 U/L — ABNORMAL HIGH (ref 0–37)
Alkaline Phosphatase: 144 U/L — ABNORMAL HIGH (ref 39–117)
CO2: 25 mEq/L (ref 19–32)
Calcium: 8.6 mg/dL (ref 8.4–10.5)
Chloride: 107 mEq/L (ref 96–112)
GFR calc non Af Amer: >90 mL/min (ref >90)
Potassium: 5.3 mEq/L — ABNORMAL HIGH (ref 3.5–5.1)
Sodium: 136 mEq/L (ref 135–145)

## 2011-08-14 MED ORDER — OXYCODONE-ACETAMINOPHEN 5-325 MG PO TABS: 1.0000 | ORAL_TABLET | ORAL | Status: AC | PRN

## 2011-08-14 MED ORDER — MOXIFLOXACIN HCL 400 MG PO TABS: 400.0000 mg | ORAL_TABLET | Freq: Every day | ORAL | Status: AC

## 2011-08-14 MED ORDER — METHADONE HCL 10 MG PO TABS: 10.0000 mg | ORAL_TABLET | Freq: Three times a day (TID) | ORAL | Status: DC

## 2011-08-14 MED ORDER — MOXIFLOXACIN HCL IN NACL 400 MG/250ML IV SOLN
400.0000 mg | INTRAVENOUS | Status: DC
  Administered 2011-08-14: 400 mg via INTRAVENOUS
  Filled 2011-08-14: qty 250

## 2011-08-14 NOTE — Care Management Note (Signed)
    Page 1 of 1   08/14/2011     1:02:51 PM   CARE MANAGEMENT NOTE 08/14/2011  Patient:  [name], [name]   Account Number:  [id number]  Date Initiated:  08/14/2011  Documentation initiated by:  Lanier Clam  Subjective/Objective Assessment:   ADMITTED W/SCC.     Action/Plan:   FROM HOME   Anticipated DC Date:  08/14/2011   Anticipated DC Plan:  HOME/SELF CARE      DC Planning Services  CM consult      Choice offered to / List presented to:             Status of service:  Completed, signed off Medicare Important Message given?   (If response is "NO", the following Medicare IM given date fields will be blank) Date Medicare IM given:   Date Additional Medicare IM given:    Discharge Disposition:  HOME/SELF CARE  Per UR Regulation:  Reviewed for med. necessity/level of care/duration of stay  If discussed at Long Length of Stay Meetings, dates discussed:    Comments:  08/14/11 Rhonna Holster RN,BSN NCM [phone] D/C HOME. NO D/C NEEDS.

## 2011-08-14 NOTE — Discharge Summary (Signed)
Sickle Cell Medical Discharge Summary   Patient ID: [facility] MRN: [id number] DOB/AGE: 40-Aug-1972 40 y.o.  Admit date: 08/10/2011 Discharge date: 08/14/2011  Primary Care Physician:  Willey Blade, MD  Admission Diagnoses:  Active Problems: Sickle cell crisis  Chronic pain Anemia  Leucocytosis     Discharge Diagnoses:   Sickle cell crisis  Chronic pain Anemia  Leucocytosis    Discharge Medications:  Medication List  As of 08/14/2011  9:31 AM   ASK your doctor about these medications         allopurinol 300 MG tablet   Commonly known as: ZYLOPRIM   Take 300 mg by mouth daily.      folic acid 1 MG tablet   Commonly known as: FOLVITE   Take 1 mg by mouth daily.      methadone 10 MG tablet   Commonly known as: DOLOPHINE   Take 10 mg by mouth 3 (three) times daily.      metoprolol succinate 25 MG 24 hr tablet   Commonly known as: TOPROL-XL   Take 25 mg by mouth daily.      zolpidem 10 MG tablet   Commonly known as: AMBIEN   Take 10 mg by mouth at bedtime as needed. For sleep.             Consults:   None  Significant Diagnostic Studies:  Dg Chest 2 View  08/14/2011  *RADIOLOGY REPORT*  Clinical Data: Rule out acute chest syndrome.  History of sickle cell disease, congestive heart failure and fatigue.  CHEST - 2 VIEW  Comparison: 07/08/2013and CT 03/23/2011  Findings: Heart and mediastinal contours are stable.  The lung fields demonstrate mild diffuse coarseness of the interstitial markings most pronounced at the lung bases.  This finding is unchanged in comparison with the prior exam and would correlate with the finding on recent CT would raise the possibility of underlying interstitial disease.  More confluent infiltrate is suggested at the right lung base and raises the possibility of an underlying area of focal pneumonia superimposed on chronic change.  This demonstrates no interval change in comparison with previous recent exam.  No associated pleural fluid  is seen.  Bony structures appear intact.  IMPRESSION: Unchanged cardiopulmonary appearance with diffuse interstitial prominence most pronounced at the lung bases and more confluent alveolar infiltrate suggested at the right lung base.  Original Report Authenticated By: Bertha Stakes, M.D.   Dg Chest 2 View  08/11/2011  *RADIOLOGY REPORT*  Clinical Data: Sickle cell pain, history of CHF  CHEST - 2 VIEW  Comparison: 03/23/2011; 11/28/2010; chest CT - 03/23/2011  Findings: Grossly unchanged borderline enlarged cardiac silhouette and mediastinal contours.  Minimal increased conspicuity of the pulmonary interstitium bilaterally with relative area of consolidation within the right mid lung.  There is persistent blunting of the bilateral costophrenic angles, right greater than left,without definite pleural effusion.  No pneumothorax. Unchanged bones.  IMPRESSION: Chronic interstitial thickening with possible area of superimposed consolidation within the right mid lung, possibly atelectasis though developing infection may have a similar appearance. Continued attention on follow-up is recommended.  Original Report Authenticated By: Waynard Reeds, M.D.     Sickle Cell Medical Course:  For complete details please refer to admission H and P, but in brief,This is a 41 years old African American male with sickle cell disease presented to the [facility] with  generalized body ache with fatigue. Transferred to inpatient service for further evaluation and treatment of IVF, pain ,  pruritus, bowel , DVT prophylaxis management. Today, he feels his pain is manageable at home although still has diffuse pain bilateral knees.   Physical Exam at Discharge:  BP 114/64  Pulse 72  Temp 98.6 F (37 C) (Oral)  Resp 15  Ht 5\' 10"  (1.778 m)  Wt 85.548 kg (188 lb 9.6 oz)  BMI 27.06 kg/m2  SpO2 91%  General Appearance: Alert, cooperative, well developed, well nourished, mild distress  Back: Symmetric, normal ROM, mild CVA  tenderness, diffuse tenderness  Resp: Diminished breath sounds bibasilar and bilaterally, CTA, no wheezes/rales/rhonchi  Cardio: Regular rate and rhythm, S1, S2 normal, no murmur, click, rub or gallop  GI: Soft, non-tender, slight distension, normal bowel sounds, no masses, no organomegaly (last BM 08/12/11) Male Genitalia: Deferred, the patient denies priapism  Extremities: Normal, atraumatic, no cyanosis or edema, no sign of DVT, bilateral tenderness esp knees   Disposition at Discharge: 01-Home and Self Care  Discharge Orders: Refer to discharge instructions Follow up with Dr. August Saucer in 1-2 weeks [phone]  Condition at Discharge:   Stable  Time spent on Discharge:  Greater than 30 minutes.  SignedRanda Evens, Kyng Matlock P 08/14/2011, 9:31 AM

## 2011-08-17 NOTE — Discharge Summary (Signed)
[facility] Discharge Summary  Patient ID: [name] MRN: [id number] DOB/AGE: 40-Jun-1972 40 y.o.  Admit date: 06/20/2011 Discharge date: 06/20/2011  Admission Diagnoses:  Discharge Diagnoses:  Sickle cell anemia Symptomatic anemia Hemachromatosis Gout History acute chest syndrome  Discharged Condition: stable Clinic Course: patient presented to the sickle cell Center to be typed and cross matched for blood. He has recently been having more problems with fatigue and ongoing pain. Patient is scheduled to undergo exchange transfusions in the unit tomorrow. He has continued to work despite this. This pain is otherwise adequately controlled. Blood was drawn without incident. Patient was discharged home with plans to return to the sickle cell unit tomorrow for further therapy.    Disposition: 01-Home or Self Care   Medications: Allopurinol 300 mg daily Methadone 10 mg t.i.d. Metoprolol 25 mg q.d. Zolpidem 10 mg q.h.s. P.r.n.   SignedAugust Saucer, [name] 06/20/2011, 11:00 AM

## 2011-12-07 ENCOUNTER — Inpatient Hospital Stay (HOSPITAL_COMMUNITY)
Admission: EM | Admit: 2011-12-07 | Discharge: 2011-12-09 | DRG: 395 | Disposition: A | Discharge status: Home / Self Care | Payer: BC Managed Care – PPO | Attending: Internal Medicine | Admitting: Internal Medicine

## 2011-12-07 ENCOUNTER — Encounter (HOSPITAL_COMMUNITY): Payer: Self-pay | Admitting: Emergency Medicine

## 2011-12-07 ENCOUNTER — Other Ambulatory Visit: Payer: Self-pay

## 2011-12-07 ENCOUNTER — Telehealth: Payer: Self-pay | Admitting: *Deleted

## 2011-12-07 DIAGNOSIS — G8929 Other chronic pain: Secondary | ICD-10-CM | POA: Diagnosis present

## 2011-12-07 DIAGNOSIS — D72829 Elevated white blood cell count, unspecified: Secondary | ICD-10-CM | POA: Diagnosis present

## 2011-12-07 DIAGNOSIS — D5700 Hb-SS disease with crisis, unspecified: Principal | ICD-10-CM | POA: Diagnosis present

## 2011-12-07 DIAGNOSIS — R000 Tachycardia, unspecified: Secondary | ICD-10-CM | POA: Diagnosis present

## 2011-12-07 DIAGNOSIS — D571 Sickle-cell disease without crisis: Secondary | ICD-10-CM

## 2011-12-07 LAB — COMPREHENSIVE METABOLIC PANEL WITH GFR
ALT: 49 U/L (ref 0–53)
AST: 148 U/L — ABNORMAL HIGH (ref 0–37)
Albumin: 2.9 g/dL — ABNORMAL LOW (ref 3.5–5.2)
Alkaline Phosphatase: 106 U/L (ref 39–117)
BUN: 15 mg/dL (ref 6–23)
CO2: 25 meq/L (ref 19–32)
Calcium: 8.7 mg/dL (ref 8.4–10.5)
Chloride: 104 meq/L (ref 96–112)
Creatinine, Ser: 0.87 mg/dL (ref 0.50–1.35)
GFR calc Af Amer: >90 mL/min (ref >90)
GFR calc non Af Amer: >90 mL/min (ref >90)
Glucose, Bld: 102 mg/dL — ABNORMAL HIGH (ref 70–99)
Potassium: 4.6 meq/L (ref 3.5–5.1)
Sodium: 137 meq/L (ref 135–145)
Total Bilirubin: 8.4 mg/dL — ABNORMAL HIGH (ref 0.3–1.2)
Total Protein: 6.9 g/dL (ref 6.0–8.3)

## 2011-12-07 LAB — RETICULOCYTES
RBC.: 1.69 MIL/uL — ABNORMAL LOW (ref 4.22–5.81)
Retic Count, Absolute: 841.6 K/uL — ABNORMAL HIGH (ref 19.0–186.0)
Retic Ct Pct: 49.8 % — ABNORMAL HIGH (ref 0.4–3.1)

## 2011-12-07 LAB — PREPARE RBC (CROSSMATCH)

## 2011-12-07 LAB — CBC
Hemoglobin: 6 g/dL — CL (ref 13.0–17.0)
MCH: 35.5 pg — ABNORMAL HIGH (ref 26.0–34.0)
MCHC: 35.9 g/dL (ref 30.0–36.0)
Platelets: 233 10*3/uL (ref 150–400)

## 2011-12-07 MED ORDER — METHADONE HCL 10 MG PO TABS
10.0000 mg | ORAL_TABLET | Freq: Three times a day (TID) | ORAL | Status: DC
  Administered 2011-12-07 – 2011-12-09 (×7): 10 mg via ORAL
  Filled 2011-12-07 (×7): qty 1

## 2011-12-07 MED ORDER — ACETAMINOPHEN 650 MG RE SUPP: 650.0000 mg | RECTAL | Status: DC | PRN

## 2011-12-07 MED ORDER — IBUPROFEN 800 MG PO TABS
400.0000 mg | ORAL_TABLET | Freq: Four times a day (QID) | ORAL | Status: DC | PRN
  Administered 2011-12-08: 800 mg via ORAL
  Filled 2011-12-07: qty 1

## 2011-12-07 MED ORDER — SODIUM CHLORIDE 0.45 % IV SOLN
INTRAVENOUS | Status: DC
  Administered 2011-12-07 – 2011-12-08 (×2): via INTRAVENOUS

## 2011-12-07 MED ORDER — ACETAMINOPHEN 325 MG PO TABS: 650.0000 mg | ORAL_TABLET | ORAL | Status: DC | PRN

## 2011-12-07 MED ORDER — HYDROMORPHONE HCL PF 1 MG/ML IJ SOLN
2.0000 mg | Freq: Once | INTRAMUSCULAR | Status: AC
  Administered 2011-12-07: 2 mg via INTRAVENOUS
  Filled 2011-12-07: qty 2

## 2011-12-07 MED ORDER — SALINE SPRAY 0.65 % NA SOLN
1.0000 | NASAL | Status: DC | PRN
  Filled 2011-12-07: qty 44

## 2011-12-07 MED ORDER — HYDROCODONE-ACETAMINOPHEN 5-325 MG PO TABS
1.0000 | ORAL_TABLET | ORAL | Status: DC | PRN
  Administered 2011-12-09 (×2): 2 via ORAL
  Filled 2011-12-07 (×2): qty 2

## 2011-12-07 MED ORDER — HYDROMORPHONE HCL PF 1 MG/ML IJ SOLN
1.0000 mg | INTRAMUSCULAR | Status: DC | PRN
  Administered 2011-12-07 – 2011-12-08 (×7): 2 mg via INTRAVENOUS
  Administered 2011-12-09 (×2): 1 mg via INTRAVENOUS
  Administered 2011-12-09: 2 mg via INTRAVENOUS
  Filled 2011-12-07: qty 2
  Filled 2011-12-07: qty 1
  Filled 2011-12-07 (×7): qty 2
  Filled 2011-12-07: qty 1

## 2011-12-07 MED ORDER — ONDANSETRON HCL 4 MG/2ML IJ SOLN: 4.0000 mg | Freq: Three times a day (TID) | INTRAMUSCULAR | Status: DC | PRN

## 2011-12-07 MED ORDER — ONDANSETRON HCL 4 MG PO TABS: 4.0000 mg | ORAL_TABLET | ORAL | Status: DC | PRN

## 2011-12-07 MED ORDER — ONDANSETRON HCL 4 MG/2ML IJ SOLN
4.0000 mg | Freq: Once | INTRAMUSCULAR | Status: AC
  Administered 2011-12-07: 4 mg via INTRAVENOUS
  Filled 2011-12-07: qty 2

## 2011-12-07 MED ORDER — HYDROMORPHONE HCL PF 1 MG/ML IJ SOLN: 1.0000 mg | INTRAMUSCULAR | Status: DC | PRN

## 2011-12-07 MED ORDER — ENOXAPARIN SODIUM 40 MG/0.4ML ~~LOC~~ SOLN
40.0000 mg | SUBCUTANEOUS | Status: DC
  Administered 2011-12-07 – 2011-12-08 (×2): 40 mg via SUBCUTANEOUS
  Filled 2011-12-07 (×3): qty 0.4

## 2011-12-07 MED ORDER — ONDANSETRON HCL 4 MG/2ML IJ SOLN: 4.0000 mg | INTRAMUSCULAR | Status: DC | PRN

## 2011-12-07 MED ORDER — SODIUM CHLORIDE 0.65 % NA SOLN: 1.0000 | NASAL | Status: DC | PRN

## 2011-12-07 MED ORDER — DIPHENHYDRAMINE HCL 50 MG/ML IJ SOLN: 12.5000 mg | INTRAMUSCULAR | Status: DC | PRN

## 2011-12-07 MED ORDER — SODIUM CHLORIDE 0.9 % IV SOLN: INTRAVENOUS | Status: DC

## 2011-12-07 MED ORDER — ZOLPIDEM TARTRATE 10 MG PO TABS: 10.0000 mg | ORAL_TABLET | Freq: Every evening | ORAL | Status: DC | PRN

## 2011-12-07 MED ORDER — SODIUM CHLORIDE 0.9 % IV BOLUS (SEPSIS)
500.0000 mL | Freq: Once | INTRAVENOUS | Status: AC
  Administered 2011-12-07: 500 mL via INTRAVENOUS

## 2011-12-07 MED ORDER — FOLIC ACID 1 MG PO TABS
1.0000 mg | ORAL_TABLET | Freq: Every day | ORAL | Status: DC
  Administered 2011-12-07 – 2011-12-09 (×3): 1 mg via ORAL
  Filled 2011-12-07 (×3): qty 1

## 2011-12-07 MED ORDER — DIPHENHYDRAMINE HCL 25 MG PO CAPS: 25.0000 mg | ORAL_CAPSULE | ORAL | Status: DC | PRN

## 2011-12-07 NOTE — ED Notes (Signed)
Pt states he has been in sickle cell crisis x 2 days.  Pain in hips, upper back, and jaw.  Pt was 78% on RA.  Pt placed on NRB.  Dr. Deretha Emory called in.  Pt backed off of NRB and is sustaining 95% on 2 L [place].

## 2011-12-07 NOTE — Telephone Encounter (Signed)
Reports increased sickle cell - related pain over the last 2 days, unrelieved by prescribed medications. Rates 4-5/10, chest, jaw and back. Reports chills, hasn't checked temperature. Reports palpitations. Symptoms reviewed by NP on unit and ED evaluation advised. Patient verbalizes understanding.

## 2011-12-07 NOTE — ED Provider Notes (Addendum)
History     CSN: [id number]  Arrival date & time 12/07/11  1149   First MD Initiated Contact with Patient 12/07/11 1221      Chief Complaint  Patient presents with  . Sickle Cell Pain Crisis    (Consider location/radiation/quality/duration/timing/severity/associated sxs/prior treatment) Patient is a 41 y.o. male presenting with sickle cell pain. The history is provided by the patient.  Sickle Cell Pain Crisis  Pertinent negatives include no chest pain, no abdominal pain, no diarrhea, no vomiting, no dysuria, no headaches, no sore throat, no back pain, no neck pain, no cough, no rash and no eye redness.  pt c/o sickle cell pain in bil shoulder and bil hips for past 1-2 days. Pain, constant, dull, severe, worse w movement/palpation. No focal joint swelling. No redness. No fever or chills. Denies cough or uri c/o. No cp or sob. No abd pain. No nvd. No faintness or lightheadedness. No headaches.     Past Medical History  Diagnosis Date  . CHF (congestive heart failure)   . Gouty arthropathy   . Chronic fatigue syndrome   . Sickle cell anemia     Past Surgical History  Procedure Date  . Appendectomy   . Cholecystectomy   . Splenectomy     Family History  Problem Relation Age of Onset  . Sickle cell anemia      History  Substance Use Topics  . Smoking status: Never Smoker   . Smokeless tobacco: Not on file  . Alcohol Use: No      Review of Systems  Constitutional: Negative for fever and chills.  HENT: Negative for sore throat and neck pain.   Eyes: Negative for redness.  Respiratory: Negative for cough and shortness of breath.   Cardiovascular: Negative for chest pain.  Gastrointestinal: Negative for vomiting, abdominal pain and diarrhea.  Genitourinary: Negative for dysuria and flank pain.  Musculoskeletal: Negative for back pain.  Skin: Negative for rash.  Neurological: Negative for headaches.  Hematological: Does not bruise/bleed easily.    Psychiatric/Behavioral: Negative for confusion.    Allergies  Review of patient's allergies indicates no known allergies.  Home Medications   Current Outpatient Rx  Name  Route  Sig  Dispense  Refill  . ALLOPURINOL 300 MG PO TABS   Oral   Take 300 mg by mouth daily.         Marland Kitchen FOLIC ACID 1 MG PO TABS   Oral   Take 1 mg by mouth daily.         Marland Kitchen METHADONE HCL 10 MG PO TABS   Oral   Take 1 tablet (10 mg total) by mouth 3 (three) times daily.   60 tablet   0   . METOPROLOL SUCCINATE ER 25 MG PO TB24   Oral   Take 25 mg by mouth daily.         Marland Kitchen ZOLPIDEM TARTRATE 10 MG PO TABS   Oral   Take 10 mg by mouth at bedtime as needed. For sleep.           BP 142/65  Pulse 95  Temp 97.9 F (36.6 C) (Oral)  Resp 20  SpO2 95%  Physical Exam  Nursing note and vitals reviewed. Constitutional: He is oriented to person, place, and time. He appears well-developed and well-nourished. No distress.  HENT:  Head: Atraumatic.  Mouth/Throat: Oropharynx is clear and moist.  Eyes: Conjunctivae normal are normal.  Neck: Neck supple. No tracheal deviation present.  Cardiovascular: Normal  rate, regular rhythm, normal heart sounds and intact distal pulses.   Pulmonary/Chest: Effort normal and breath sounds normal. No accessory muscle usage. No respiratory distress.  Abdominal: Soft. Bowel sounds are normal. He exhibits no distension. There is no tenderness.  Musculoskeletal: Normal range of motion. He exhibits no edema.  Neurological: He is alert and oriented to person, place, and time.  Skin: Skin is warm and dry.  Psychiatric: He has a normal mood and affect.    ED Course  Procedures (including critical care time)  Results for orders placed during the hospital encounter of 12/07/11  COMPREHENSIVE METABOLIC PANEL      Component Value Range   Sodium 137  135 - 145 mEq/L   Potassium 4.6  3.5 - 5.1 mEq/L   Chloride 104  96 - 112 mEq/L   CO2 25  19 - 32 mEq/L   Glucose, Bld  102 (*) 70 - 99 mg/dL   BUN 15  6 - 23 mg/dL   Creatinine, Ser 1.61  0.50 - 1.35 mg/dL   Calcium 8.7  8.4 - 09.6 mg/dL   Total Protein 6.9  6.0 - 8.3 g/dL   Albumin 2.9 (*) 3.5 - 5.2 g/dL   AST 045 (*) 0 - 37 U/L   ALT 49  0 - 53 U/L   Alkaline Phosphatase 106  39 - 117 U/L   Total Bilirubin 8.4 (*) 0.3 - 1.2 mg/dL   GFR calc non Af Amer >90  >90 mL/min   GFR calc Af Amer >90  >90 mL/min  CBC      Component Value Range   WBC PENDING  4.0 - 10.5 K/uL   RBC 1.69 (*) 4.22 - 5.81 MIL/uL   Hemoglobin 6.0 (*) 13.0 - 17.0 g/dL   HCT 40.9 (*) 81.1 - 91.4 %   MCV 98.8  78.0 - 100.0 fL   MCH 35.5 (*) 26.0 - 34.0 pg   MCHC 35.9  30.0 - 36.0 g/dL   RDW 78.2 (*) 95.6 - 21.3 %   Platelets PENDING  150 - 400 K/uL  RETICULOCYTES      Component Value Range   Retic Ct Pct 49.8 (*) 0.4 - 3.1 %   RBC. 1.69 (*) 4.22 - 5.81 MIL/uL   Retic Count, Manual 841.6 (*) 19.0 - 186.0 K/uL     MDM  Iv ns 500 cc bolus. o2 [place]. Dilaudid iv. zofran iv.  Reviewed nursing notes and prior charts for additional history.   Labs.    Date: 12/07/2011  Rate: 105  Rhythm: sinus tachycardia  QRS Axis: left  Intervals: normal  ST/T Wave abnormalities: normal  Conduction Disutrbances:none  Narrative Interpretation:   Old EKG Reviewed: unchanged   Recheck pain mildly improved, but persists.   Dilaudid 2 mg iv.   Persistent pain. Denies cp or sob. No cough. No fever or chills.  Given persistent pain, hgb 6, hospitalist service called to admit.   Discussed w [facility], they state place temp orders to inpt bed, dr dean, med surg bed, they will admit. Requests we order 1 unit prbc.        Suzi Roots, MD 12/07/11 1430  Suzi Roots, MD 12/07/11 [phone]

## 2011-12-07 NOTE — ED Notes (Signed)
RN to obtain labs with start of IV 

## 2011-12-07 NOTE — H&P (Signed)
Patient's PCP: Willey Blade, MD  Chief Complaint: Generalized pain  History of Present Illness: [name] is a 41 y.o. African American male with history of sickle cell disease, anemia, crisis, gouty arthropathy, and diastolic congestive heart failure last available 2-D echocardiogram was on 09/21/2007 showed EF of 60-65%.  Patient reports that his symptoms started with generalized pain about a week ago but about 4 days ago his pain worsen.  Reports that his pain is mainly in upper back, jaw, bilateral shoulders, and bilateral hips.  He reports that the pain that he is having at this time is his typical sickle cell crisis pain.  He denies having any fevers, chills, nausea, vomiting, chest pain, shortness of breath, headaches or vision changes.  Denies any dysuria or cough.  Does complain of having intermittent abdominal pain which he does had with crisis.  Past Medical History  Diagnosis Date  . CHF (congestive heart failure)   . Gouty arthropathy   . Chronic fatigue syndrome   . Sickle cell anemia    Past Surgical History  Procedure Date  . Appendectomy   . Cholecystectomy   . Splenectomy    Family History  Problem Relation Age of Onset  . Sickle cell anemia    . Sickle cell trait Mother    History   Social History  . Marital Status: Single    Spouse Name: N/A    Number of Children: N/A  . Years of Education: N/A   Occupational History  . Not on file.   Social History Main Topics  . Smoking status: Never Smoker   . Smokeless tobacco: Not on file  . Alcohol Use: No  . Drug Use: No  . Sexually Active: Yes   Other Topics Concern  . 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  Estranged from his biological father.   Allergies: Review of patient's allergies indicates no known allergies.  Home Meds: Prior to Admission medications   Medication Sig Start Date End Date Taking? Authorizing Provider  ibuprofen (ADVIL,MOTRIN) 200 MG tablet Take 400-800 mg  by mouth every 6 (six) hours as needed. For pain   Yes Historical Provider, MD  methadone (DOLOPHINE) 10 MG tablet Take 1 tablet (10 mg total) by mouth 3 (three) times daily. 08/14/11  Yes Grayce Sessions, NP  sodium chloride (OCEAN) 0.65 % nasal spray Place 1 spray into the nose as needed. For nasal congestion   Yes Historical Provider, MD  zolpidem (AMBIEN) 10 MG tablet Take 10 mg by mouth at bedtime as needed. For sleep.   Yes Historical Provider, MD    Review of Systems: All systems reviewed with the patient and positive as per history of present illness, otherwise all other systems are negative.  Physical Exam: Blood pressure 142/65, pulse 95, temperature 97.9 F (36.6 C), temperature source Oral, resp. rate 20, SpO2 95.00%. General: Awake, Oriented x3, No acute distress. HEENT: EOMI, Moist mucous membranes, slight scleral icterus. Neck: Supple CV: S1 and S2 Lungs: Clear to ascultation bilaterally Abdomen: Soft, Nontender, Nondistended, +bowel sounds. Ext: Good pulses. Trace edema. No clubbing or cyanosis noted. Neuro: Cranial Nerves II-XII grossly intact. Has 5/5 motor strength in upper and lower extremities.  Lab results:  [facility] 12/07/11 1310  NA 137  K 4.6  CL 104  CO2 25  GLUCOSE 102*  BUN 15  CREATININE 0.87  CALCIUM 8.7  MG --  PHOS --    Basename 12/07/11 1310  AST  148*  ALT 49  ALKPHOS 106  BILITOT 8.4*  PROT 6.9  ALBUMIN 2.9*   No results found for this basename: LIPASE:2,AMYLASE:2 in the last 72 hours  Basename 12/07/11 1345  WBC 21.4*  NEUTROABS --  HGB 6.0*  HCT 16.7*  MCV 98.8  PLT 233   No results found for this basename: CKTOTAL:3,CKMB:3,CKMBINDEX:3,TROPONINI:3 in the last 72 hours No components found with this basename: POCBNP:3 No results found for this basename: DDIMER in the last 72 hours No results found for this basename: HGBA1C:2 in the last 72 hours No results found for this basename:  CHOL:2,HDL:2,LDLCALC:2,TRIG:2,CHOLHDL:2,LDLDIRECT:2 in the last 72 hours No results found for this basename: TSH,T4TOTAL,FREET3,T3FREE,THYROIDAB in the last 72 hours  Basename 12/07/11 1345  VITAMINB12 --  FOLATE --  FERRITIN --  TIBC --  IRON --  RETICCTPCT 49.8*   Imaging results:  No results found. Other results: EKG: Sinus tachycardia with HR of 105, unchanged from previous EKG.  Assessment & Plan by Problem: Sickle cell crisis with anemia Transfuse the patient 1 unit of PRBC given hemoglobin is 6.0.  Start hypertonic saline solution.  Initiated pain regimen for pain control.  Continue oxygen.  Discussed with Dr. Willey Blade, who will assume patient's care tomorrow.  Defer to Dr. Willey Blade if he wants to consider exchange transfusion tomorrow if the patient continues to have persistent.  Leukocytosis Likely due to sickle cell crisis.  Patient not endorsing any respiratory symptoms or any dysuria to suggest an infectious etiology at this time.  Prophylaxis Lovenox.  CODE STATUS Full code.  Disposition Admit the patient to MedSurg as observation.  Time spent on admission, talking to the patient, and coordinating care was: 60 mins.  Cass Vandermeulen A, MD 12/07/2011, 3:31 PM

## 2011-12-08 LAB — TYPE AND SCREEN
Antibody Screen: NEGATIVE
Unit division: 0

## 2011-12-08 LAB — BASIC METABOLIC PANEL
CO2: 24 mEq/L (ref 19–32)
Chloride: 105 mEq/L (ref 96–112)
GFR calc Af Amer: >90 mL/min (ref >90)
Sodium: 136 mEq/L (ref 135–145)

## 2011-12-08 LAB — CBC
Platelets: 283 10*3/uL (ref 150–400)
RBC: 2.3 MIL/uL — ABNORMAL LOW (ref 4.22–5.81)
RDW: 32.8 % — ABNORMAL HIGH (ref 11.5–15.5)
WBC: 21 10*3/uL — ABNORMAL HIGH (ref 4.0–10.5)

## 2011-12-08 MED ORDER — METOPROLOL SUCCINATE 12.5 MG HALF TABLET
12.5000 mg | ORAL_TABLET | Freq: Every day | ORAL | Status: DC
  Administered 2011-12-08 – 2011-12-09 (×2): 12.5 mg via ORAL
  Filled 2011-12-08 (×2): qty 1

## 2011-12-08 NOTE — Progress Notes (Signed)
Initial review for inpatient status is complete. 

## 2011-12-09 ENCOUNTER — Inpatient Hospital Stay (HOSPITAL_COMMUNITY): Payer: BC Managed Care – PPO

## 2011-12-09 LAB — COMPREHENSIVE METABOLIC PANEL
ALT: 48 U/L (ref 0–53)
AST: 134 U/L — ABNORMAL HIGH (ref 0–37)
Albumin: 2.9 g/dL — ABNORMAL LOW (ref 3.5–5.2)
Alkaline Phosphatase: 102 U/L (ref 39–117)
BUN: 15 mg/dL (ref 6–23)
CO2: 24 mEq/L (ref 19–32)
Calcium: 8.5 mg/dL (ref 8.4–10.5)
Chloride: 104 mEq/L (ref 96–112)
Creatinine, Ser: 0.77 mg/dL (ref 0.50–1.35)
GFR calc Af Amer: >90 mL/min (ref >90)
GFR calc non Af Amer: >90 mL/min (ref >90)
Glucose, Bld: 90 mg/dL (ref 70–99)
Potassium: 5.5 mEq/L — ABNORMAL HIGH (ref 3.5–5.1)
Sodium: 134 mEq/L — ABNORMAL LOW (ref 135–145)
Total Bilirubin: 5 mg/dL — ABNORMAL HIGH (ref 0.3–1.2)
Total Protein: 7.4 g/dL (ref 6.0–8.3)

## 2011-12-09 LAB — LIPID PANEL: Cholesterol: 110 mg/dL (ref 0–200)

## 2011-12-09 LAB — CBC WITH DIFFERENTIAL/PLATELET
Eosinophils Relative: 6 % — ABNORMAL HIGH (ref 0–5)
HCT: 22.6 % — ABNORMAL LOW (ref 39.0–52.0)
Lymphocytes Relative: 35 % (ref 12–46)
Lymphs Abs: 4.6 10*3/uL — ABNORMAL HIGH (ref 0.7–4.0)
MCV: 99.1 fL (ref 78.0–100.0)
Monocytes Relative: 14 % — ABNORMAL HIGH (ref 3–12)
Neutro Abs: 5.7 10*3/uL (ref 1.7–7.7)
Platelets: 236 10*3/uL (ref 150–400)
RBC: 2.28 MIL/uL — ABNORMAL LOW (ref 4.22–5.81)
WBC: 13 10*3/uL — ABNORMAL HIGH (ref 4.0–10.5)

## 2011-12-09 LAB — PRO B NATRIURETIC PEPTIDE: Pro B Natriuretic peptide (BNP): 102.5 pg/mL (ref 0–125)

## 2011-12-09 LAB — POTASSIUM: Potassium: 5.2 mEq/L — ABNORMAL HIGH (ref 3.5–5.1)

## 2011-12-09 LAB — TROPONIN I: Troponin I: <0.3 ng/mL (ref <0.30)

## 2011-12-09 MED ORDER — POTASSIUM CHLORIDE 20 MEQ/15ML (10%) PO LIQD
20.0000 meq | Freq: Once | ORAL | Status: DC
  Filled 2011-12-09: qty 15

## 2011-12-09 MED ORDER — SODIUM POLYSTYRENE SULFONATE 15 GM/60ML PO SUSP
30.0000 g | Freq: Once | ORAL | Status: AC
  Administered 2011-12-09: 30 g via ORAL
  Filled 2011-12-09: qty 120

## 2011-12-09 MED ORDER — OXYCODONE-ACETAMINOPHEN 5-325 MG PO TABS: 1.0000 | ORAL_TABLET | ORAL | Status: DC | PRN

## 2011-12-09 NOTE — Discharge Summary (Signed)
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 Subsequently he was admitted and treated for [facility].    Discharge Medications   Medication List     As of 12/09/2011 12:34 PM    ASK your doctor about these medications         ibuprofen 200 MG tablet   Commonly known as: ADVIL,MOTRIN   Take 400-800 mg by mouth every 6 (six) hours as needed. For pain      methadone 10 MG tablet   Commonly known as: DOLOPHINE   Take 1 tablet (10 mg total) by mouth 3 (three) times daily.      sodium chloride 0.65 % nasal spray   Commonly known as: OCEAN   Place 1 spray into the nose as needed. For nasal congestion      zolpidem 10 MG tablet   Commonly known as: AMBIEN   Take 10 mg by mouth at bedtime as needed. For sleep.        Hospital Course: [name] was treated aggressive for his sickle cell crisis (which is indicating active hemolysis total bilirubin this AM 5.0) He received a transfusion of 1 unit of PRBC for anemia with  hemoglobin of 6.0 on admission.  HGB at d/c 7.8. Noted probable tachycardia secondary to /probale anemia heart rate as high as 104. Dr. August Saucer initiated low dose of BB (metoprolol 12.5 QD) Remained asymptomatic no shortness of breath. Treatment during hospitalization included pain antiemetic and antipruitius management. Initial work up  included EKG. Chest x-ray report  Stable cardiomegaly but cardiac shadow but mildly enlarged. Some mild diffuse interstitial changes stable. Last ECHO 8/09. ProBNP 102.5 not indicating cardiac origin but prior to d/c fasting lipid, troponin (<.30) and K+   Reordered.(5.2) One dose of kayexalate given. Time of d/c rated his pain as 2/10 and denies any chest pain.   Present on Admission:     Tachycardia   . Sickle cell anemia . Chronic pain . Leukocytosis . Sickle cell crisis    Day of Discharge BP 110/58  Pulse 82  Temp 98.1 F (36.7 C) (Oral)  Resp 18  Ht 5\' 9"  (1.753 m)  Wt 85 kg (187 lb 6.3 oz)  BMI 27.67 kg/m2  SpO2 92%  Results for orders placed during the hospital encounter of 12/07/11 (from the past 48 hour(s))  COMPREHENSIVE METABOLIC PANEL     Status: Abnormal   Collection Time   12/07/11  1:10 PM      Component Value Range Comment   Sodium 137  135 - 145 mEq/L    Potassium 4.6  3.5 - 5.1 mEq/L    Chloride 104  96 - 112 mEq/L    CO2 25  19 - 32 mEq/L    Glucose, Bld 102 (*) 70 - 99 mg/dL    BUN 15  6 - 23 mg/dL    Creatinine, Ser 2.84  0.50 - 1.35  mg/dL    Calcium 8.7  8.4 - 52.8 mg/dL    Total Protein 6.9  6.0 - 8.3 g/dL    Albumin 2.9 (*) 3.5 - 5.2 g/dL    AST 413 (*) 0 - 37 U/L    ALT 49  0 - 53 U/L    Alkaline Phosphatase 106  39 - 117 U/L    Total Bilirubin 8.4 (*) 0.3 - 1.2 mg/dL    GFR calc non Af Amer >90  >90 mL/min    GFR calc Af Amer >90  >90 mL/min   CBC     Status: Abnormal   Collection Time   12/07/11  1:45 PM      Component Value Range Comment   WBC 21.4 (*) 4.0 - 10.5 K/uL    RBC 1.69 (*) 4.22 - 5.81 MIL/uL    Hemoglobin 6.0 (*) 13.0 - 17.0 g/dL    HCT 24.4 (*) 01.0 - 52.0 %    MCV 98.8  78.0 - 100.0 fL    MCH 35.5 (*) 26.0 - 34.0 pg    MCHC 35.9  30.0 - 36.0 g/dL    RDW 27.2 (*) 53.6 - 15.5 %    Platelets 233  150 - 400 K/uL PLATELET COUNT CONFIRMED BY SMEAR  RETICULOCYTES     Status: Abnormal   Collection Time   12/07/11  1:45 PM       Component Value Range Comment   Retic Ct Pct 49.8 (*) 0.4 - 3.1 % RESULTS CONFIRMED BY MANUAL DILUTION   RBC. 1.69 (*) 4.22 - 5.81 MIL/uL    Retic Count, Manual 841.6 (*) 19.0 - 186.0 K/uL   PREPARE RBC (CROSSMATCH)     Status: Normal   Collection Time   12/07/11  3:00 PM      Component Value Range Comment   Order Confirmation ORDER PROCESSED BY BLOOD BANK     TYPE AND SCREEN     Status: Normal   Collection Time   12/07/11  3:02 PM      Component Value Range Comment   ABO/RH(D) A POS      Antibody Screen NEG      Sample Expiration 12/10/2011      Unit Number [id number]      Blood Component Type RBC LR PHER1      Unit division 00      Status of Unit ISSUED,FINAL      Transfusion Status OK TO TRANSFUSE      Crossmatch Result Compatible     CBC     Status: Abnormal   Collection Time   12/08/11  4:14 AM      Component Value Range Comment   WBC 21.0 (*) 4.0 - 10.5 K/uL    RBC 2.30 (*) 4.22 - 5.81 MIL/uL    Hemoglobin 7.8 (*) 13.0 - 17.0 g/dL    HCT 38.7 (*) 56.4 - 52.0 %    MCV 97.0  78.0 - 100.0 fL    MCH 33.9  26.0 - 34.0 pg    MCHC 35.0  30.0 - 36.0 g/dL    RDW 33.2 (*) 95.1 - 15.5 %    Platelets 283  150 - 400 K/uL   BASIC METABOLIC PANEL     Status: Abnormal   Collection Time   12/08/11  4:14 AM      Component Value Range Comment   Sodium 136  135 - 145 mEq/L    Potassium 4.7  3.5 - 5.1 mEq/L  Chloride 105  96 - 112 mEq/L    CO2 24  19 - 32 mEq/L    Glucose, Bld 101 (*) 70 - 99 mg/dL    BUN 13  6 - 23 mg/dL    Creatinine, Ser 1.61  0.50 - 1.35 mg/dL    Calcium 8.6  8.4 - 09.6 mg/dL    GFR calc non Af Amer >90  >90 mL/min    GFR calc Af Amer >90  >90 mL/min   CBC WITH DIFFERENTIAL     Status: Abnormal   Collection Time   12/09/11  7:55 AM      Component Value Range Comment   WBC 13.0 (*) 4.0 - 10.5 K/uL    RBC 2.28 (*) 4.22 - 5.81 MIL/uL    Hemoglobin 7.8 (*) 13.0 - 17.0 g/dL    HCT 04.5 (*) 40.9 - 52.0 %    MCV 99.1  78.0 - 100.0 fL    MCH 34.2 (*) 26.0 -  34.0 pg    MCHC 34.5  30.0 - 36.0 g/dL    RDW 81.1 (*) 91.4 - 15.5 %    Platelets 236  150 - 400 K/uL    Neutrophils Relative 44  43 - 77 %    Lymphocytes Relative 35  12 - 46 %    Monocytes Relative 14 (*) 3 - 12 %    Eosinophils Relative 6 (*) 0 - 5 %    Basophils Relative 1  0 - 1 %    Neutro Abs 5.7  1.7 - 7.7 K/uL    Lymphs Abs 4.6 (*) 0.7 - 4.0 K/uL    Monocytes Absolute 1.8 (*) 0.1 - 1.0 K/uL    Eosinophils Absolute 0.8 (*) 0.0 - 0.7 K/uL    Basophils Absolute 0.1  0.0 - 0.1 K/uL    RBC Morphology POLYCHROMASIA PRESENT      WBC Morphology ATYPICAL LYMPHOCYTES      Smear Review LARGE PLATELETS PRESENT     COMPREHENSIVE METABOLIC PANEL     Status: Abnormal   Collection Time   12/09/11  7:55 AM      Component Value Range Comment   Sodium 134 (*) 135 - 145 mEq/L    Potassium 5.5 (*) 3.5 - 5.1 mEq/L ICTERIC SPECIMEN   Chloride 104  96 - 112 mEq/L    CO2 24  19 - 32 mEq/L    Glucose, Bld 90  70 - 99 mg/dL    BUN 15  6 - 23 mg/dL    Creatinine, Ser 7.82  0.50 - 1.35 mg/dL    Calcium 8.5  8.4 - 95.6 mg/dL    Total Protein 7.4  6.0 - 8.3 g/dL    Albumin 2.9 (*) 3.5 - 5.2 g/dL    AST 213 (*) 0 - 37 U/L    ALT 48  0 - 53 U/L    Alkaline Phosphatase 102  39 - 117 U/L    Total Bilirubin 5.0 (*) 0.3 - 1.2 mg/dL    GFR calc non Af Amer >90  >90 mL/min    GFR calc Af Amer >90  >90 mL/min   PRO B NATRIURETIC PEPTIDE     Status: Normal   Collection Time   12/09/11  7:55 AM      Component Value Range Comment   Pro B Natriuretic peptide (BNP) 102.5  0 - 125 pg/mL     Dg Chest 2 View  12/09/2011  *RADIOLOGY REPORT*  Clinical Data: Sickle cell  crisis  CHEST - 2 VIEW  Comparison: 08/14/2011  Findings: The cardiac shadow is stable but mildly enlarged.  The lungs are well-aerated bilaterally with some mild diffuse interstitial changes this is stable from prior exam.  No focal infiltrate or sizable effusion is seen.  No acute bony abnormality is noted.  IMPRESSION: Stable cardiomegaly.   Stable parenchymal changes without acute abnormality.   Original Report Authenticated By: Alcide Clever, M.D.      Disposition: Home   Diet: Low Potassium diet  Activity: As tolerated   Follow-up Appts: Call for follow up with Dr. August Saucer after Echo is completed   TESTS THAT NEED FOLLOW-UP Repeat CMET Echo schedule on December 18, 2011 prior authorization pending   Time spent on discharge, talking to the patient, and coordinating care: 60 mins.   SignedGrayce Sessions 12/09/2011, 12:34 PM

## 2011-12-09 NOTE — Progress Notes (Signed)
Subjective:  Patient feeling better today. He rates his pain as a 6/10. This is steadily improving. He acknowledges he had been pushing himself a great deal. He knew that his blood count was getting low blood hold to be able to coordinate transfusion on the weekend. He continues to work excessively at times. He had been having some exertional dyspnea but this is improved since transfusion. He ultimately go home by tomorrow. He acknowledges increased pain with changing weather.   No Known Allergies Medications reviewed.  Objective: Blood pressure 125/67, pulse 80, temperature 97.8 F (36.6 C), temperature source Oral, resp. rate 18, height 5\' 9"  (1.753 m), weight 187 lb 6.3 oz (85 kg), SpO2 99.00%.  Well-developed well-nourished black male in no acute distress. HEENT:positive sclera icterus. No sinus tenderness. NECK:no enlarged thyroid. No posterior cervical nodes. LUNGS:right basilar rales. No vocal fremitus. JW:JXBJYN S1, S2 question soft intermittent S3. ABD:no epigastric tenderness. WGN:FAOZHYQMVH in knees. Negative Homans. NEURO:nonfocal.  Lab results: Results for orders placed during the hospital encounter of 12/07/11 (from the past 48 hour(s))  COMPREHENSIVE METABOLIC PANEL     Status: Abnormal   Collection Time   12/07/11  1:10 PM      Component Value Range Comment   Sodium 137  135 - 145 mEq/L    Potassium 4.6  3.5 - 5.1 mEq/L    Chloride 104  96 - 112 mEq/L    CO2 25  19 - 32 mEq/L    Glucose, Bld 102 (*) 70 - 99 mg/dL    BUN 15  6 - 23 mg/dL    Creatinine, Ser 8.46  0.50 - 1.35 mg/dL    Calcium 8.7  8.4 - 96.2 mg/dL    Total Protein 6.9  6.0 - 8.3 g/dL    Albumin 2.9 (*) 3.5 - 5.2 g/dL    AST 952 (*) 0 - 37 U/L    ALT 49  0 - 53 U/L    Alkaline Phosphatase 106  39 - 117 U/L    Total Bilirubin 8.4 (*) 0.3 - 1.2 mg/dL    GFR calc non Af Amer >90  >90 mL/min    GFR calc Af Amer >90  >90 mL/min   CBC     Status: Abnormal   Collection Time   12/07/11  1:45 PM   Component Value Range Comment   WBC 21.4 (*) 4.0 - 10.5 K/uL    RBC 1.69 (*) 4.22 - 5.81 MIL/uL    Hemoglobin 6.0 (*) 13.0 - 17.0 g/dL    HCT 84.1 (*) 32.4 - 52.0 %    MCV 98.8  78.0 - 100.0 fL    MCH 35.5 (*) 26.0 - 34.0 pg    MCHC 35.9  30.0 - 36.0 g/dL    RDW 40.1 (*) 02.7 - 15.5 %    Platelets 233  150 - 400 K/uL PLATELET COUNT CONFIRMED BY SMEAR  RETICULOCYTES     Status: Abnormal   Collection Time   12/07/11  1:45 PM      Component Value Range Comment   Retic Ct Pct 49.8 (*) 0.4 - 3.1 % RESULTS CONFIRMED BY MANUAL DILUTION   RBC. 1.69 (*) 4.22 - 5.81 MIL/uL    Retic Count, Manual 841.6 (*) 19.0 - 186.0 K/uL   PREPARE RBC (CROSSMATCH)     Status: Normal   Collection Time   12/07/11  3:00 PM      Component Value Range Comment   Order Confirmation ORDER PROCESSED BY BLOOD BANK  TYPE AND SCREEN     Status: Normal   Collection Time   12/07/11  3:02 PM      Component Value Range Comment   ABO/RH(D) A POS      Antibody Screen NEG      Sample Expiration 12/10/2011      Unit Number [id number]      Blood Component Type RBC LR PHER1      Unit division 00      Status of Unit ISSUED,FINAL      Transfusion Status OK TO TRANSFUSE      Crossmatch Result Compatible     CBC     Status: Abnormal   Collection Time   12/08/11  4:14 AM      Component Value Range Comment   WBC 21.0 (*) 4.0 - 10.5 K/uL    RBC 2.30 (*) 4.22 - 5.81 MIL/uL    Hemoglobin 7.8 (*) 13.0 - 17.0 g/dL    HCT 11.9 (*) 14.7 - 52.0 %    MCV 97.0  78.0 - 100.0 fL    MCH 33.9  26.0 - 34.0 pg    MCHC 35.0  30.0 - 36.0 g/dL    RDW 82.9 (*) 56.2 - 15.5 %    Platelets 283  150 - 400 K/uL   BASIC METABOLIC PANEL     Status: Abnormal   Collection Time   12/08/11  4:14 AM      Component Value Range Comment   Sodium 136  135 - 145 mEq/L    Potassium 4.7  3.5 - 5.1 mEq/L    Chloride 105  96 - 112 mEq/L    CO2 24  19 - 32 mEq/L    Glucose, Bld 101 (*) 70 - 99 mg/dL    BUN 13  6 - 23 mg/dL    Creatinine, Ser 1.30   0.50 - 1.35 mg/dL    Calcium 8.6  8.4 - 86.5 mg/dL    GFR calc non Af Amer >90  >90 mL/min    GFR calc Af Amer >90  >90 mL/min     Studies/Results: No results found.  Patient Active Problem List  Diagnosis  . Sickle cell anemia  . Gout  . Chronic pain  . Hypokalemia  . Leukocytosis  . Acute chest syndrome  . Hyperkalemia  . Hemochromatosis  . Sickle cell crisis    Impression: Sickle cell crisis, resolving. Active hemolysis. History of her early congestive heart failure. Situational stress. History of acute chest syndrome. Secondary hemachromatosis.   Plan: Continue present therapy. Followup chest x-ray. CBC, CMET, and BNP in a.m. Restart low-dose metoprolol. Anticipate further outpatient management.   [name] 12/08/2011, 9:00 AM

## 2011-12-18 ENCOUNTER — Ambulatory Visit (HOSPITAL_COMMUNITY): Admit: 2011-12-18 | Payer: BC Managed Care – PPO

## 2012-02-16 ENCOUNTER — Encounter (HOSPITAL_COMMUNITY): Payer: Self-pay | Admitting: *Deleted

## 2012-02-16 ENCOUNTER — Emergency Department (HOSPITAL_COMMUNITY): Payer: BC Managed Care – PPO

## 2012-02-16 ENCOUNTER — Inpatient Hospital Stay (HOSPITAL_COMMUNITY)
Admission: EM | Admit: 2012-02-16 | Discharge: 2012-02-27 | DRG: 574 | Disposition: A | Discharge status: Home / Self Care | Payer: BC Managed Care – PPO | Attending: Internal Medicine | Admitting: Internal Medicine

## 2012-02-16 DIAGNOSIS — E875 Hyperkalemia: Secondary | ICD-10-CM

## 2012-02-16 DIAGNOSIS — D5700 Hb-SS disease with crisis, unspecified: Principal | ICD-10-CM | POA: Diagnosis present

## 2012-02-16 DIAGNOSIS — N179 Acute kidney failure, unspecified: Secondary | ICD-10-CM | POA: Diagnosis not present

## 2012-02-16 DIAGNOSIS — G9332 Myalgic encephalomyelitis/chronic fatigue syndrome: Secondary | ICD-10-CM | POA: Diagnosis present

## 2012-02-16 DIAGNOSIS — D72829 Elevated white blood cell count, unspecified: Secondary | ICD-10-CM | POA: Diagnosis present

## 2012-02-16 DIAGNOSIS — N170 Acute kidney failure with tubular necrosis: Secondary | ICD-10-CM | POA: Diagnosis not present

## 2012-02-16 DIAGNOSIS — J189 Pneumonia, unspecified organism: Secondary | ICD-10-CM

## 2012-02-16 DIAGNOSIS — Z23 Encounter for immunization: Secondary | ICD-10-CM

## 2012-02-16 DIAGNOSIS — J984 Other disorders of lung: Secondary | ICD-10-CM

## 2012-02-16 DIAGNOSIS — T368X5A Adverse effect of other systemic antibiotics, initial encounter: Secondary | ICD-10-CM | POA: Diagnosis not present

## 2012-02-16 DIAGNOSIS — M25561 Pain in right knee: Secondary | ICD-10-CM

## 2012-02-16 DIAGNOSIS — I5030 Unspecified diastolic (congestive) heart failure: Secondary | ICD-10-CM | POA: Diagnosis present

## 2012-02-16 DIAGNOSIS — D571 Sickle-cell disease without crisis: Secondary | ICD-10-CM

## 2012-02-16 DIAGNOSIS — R17 Unspecified jaundice: Secondary | ICD-10-CM | POA: Diagnosis present

## 2012-02-16 DIAGNOSIS — D649 Anemia, unspecified: Secondary | ICD-10-CM

## 2012-02-16 DIAGNOSIS — I509 Heart failure, unspecified: Secondary | ICD-10-CM | POA: Diagnosis present

## 2012-02-16 DIAGNOSIS — Z79899 Other long term (current) drug therapy: Secondary | ICD-10-CM

## 2012-02-16 DIAGNOSIS — R0902 Hypoxemia: Secondary | ICD-10-CM | POA: Diagnosis present

## 2012-02-16 DIAGNOSIS — Z9089 Acquired absence of other organs: Secondary | ICD-10-CM

## 2012-02-16 DIAGNOSIS — M109 Gout, unspecified: Secondary | ICD-10-CM | POA: Diagnosis present

## 2012-02-16 DIAGNOSIS — E876 Hypokalemia: Secondary | ICD-10-CM

## 2012-02-16 DIAGNOSIS — G8929 Other chronic pain: Secondary | ICD-10-CM | POA: Diagnosis present

## 2012-02-16 DIAGNOSIS — I059 Rheumatic mitral valve disease, unspecified: Secondary | ICD-10-CM | POA: Diagnosis present

## 2012-02-16 DIAGNOSIS — R5382 Chronic fatigue, unspecified: Secondary | ICD-10-CM | POA: Diagnosis present

## 2012-02-16 DIAGNOSIS — I471 Supraventricular tachycardia, unspecified: Secondary | ICD-10-CM | POA: Diagnosis present

## 2012-02-16 DIAGNOSIS — T502X5A Adverse effect of carbonic-anhydrase inhibitors, benzothiadiazides and other diuretics, initial encounter: Secondary | ICD-10-CM | POA: Diagnosis present

## 2012-02-16 DIAGNOSIS — T3995XA Adverse effect of unspecified nonopioid analgesic, antipyretic and antirheumatic, initial encounter: Secondary | ICD-10-CM | POA: Diagnosis present

## 2012-02-16 DIAGNOSIS — R0603 Acute respiratory distress: Secondary | ICD-10-CM | POA: Diagnosis present

## 2012-02-16 DIAGNOSIS — D5701 Hb-SS disease with acute chest syndrome: Secondary | ICD-10-CM | POA: Diagnosis present

## 2012-02-16 DIAGNOSIS — I498 Other specified cardiac arrhythmias: Secondary | ICD-10-CM | POA: Diagnosis present

## 2012-02-16 LAB — CK TOTAL AND CKMB (NOT AT ARMC)
CK, MB: 1.2 ng/mL (ref 0.3–4.0)
CK, MB: 1.2 ng/mL (ref 0.3–4.0)
Relative Index: INVALID (ref 0.0–2.5)
Relative Index: INVALID (ref 0.0–2.5)

## 2012-02-16 LAB — COMPREHENSIVE METABOLIC PANEL
Alkaline Phosphatase: 116 U/L (ref 39–117)
BUN: 15 mg/dL (ref 6–23)
Chloride: 109 mEq/L (ref 96–112)
Creatinine, Ser: 0.9 mg/dL (ref 0.50–1.35)
GFR calc Af Amer: >90 mL/min (ref >90)
Glucose, Bld: 95 mg/dL (ref 70–99)
Potassium: 4.3 mEq/L (ref 3.5–5.1)
Total Bilirubin: 9.2 mg/dL — ABNORMAL HIGH (ref 0.3–1.2)
Total Protein: 7.3 g/dL (ref 6.0–8.3)

## 2012-02-16 LAB — BLOOD GAS, ARTERIAL
Acid-base deficit: 1.3 mmol/L (ref 0.0–2.0)
Patient temperature: 98.6
TCO2: 22.8 mmol/L (ref 0–100)
pH, Arterial: 7.368 (ref 7.350–7.450)

## 2012-02-16 LAB — CBC WITH DIFFERENTIAL/PLATELET
Basophils Absolute: 0.7 10*3/uL — ABNORMAL HIGH (ref 0.0–0.1)
Basophils Relative: 3 % — ABNORMAL HIGH (ref 0–1)
Eosinophils Relative: 3 % (ref 0–5)
HCT: 18.2 % — ABNORMAL LOW (ref 39.0–52.0)
Hemoglobin: 6.5 g/dL — CL (ref 13.0–17.0)
Lymphocytes Relative: 41 % (ref 12–46)
Lymphs Abs: 9.6 10*3/uL — ABNORMAL HIGH (ref 0.7–4.0)
MCHC: 35.7 g/dL (ref 30.0–36.0)
Monocytes Absolute: 0.5 10*3/uL (ref 0.1–1.0)
Monocytes Relative: 2 % — ABNORMAL LOW (ref 3–12)
Neutro Abs: 12 10*3/uL — ABNORMAL HIGH (ref 1.7–7.7)
Neutrophils Relative %: 50 % (ref 43–77)
Promyelocytes Absolute: 0 %
RBC: 1.84 MIL/uL — ABNORMAL LOW (ref 4.22–5.81)
WBC: 23.5 10*3/uL — ABNORMAL HIGH (ref 4.0–10.5)

## 2012-02-16 LAB — TROPONIN I: Troponin I: <0.3 ng/mL (ref <0.30)

## 2012-02-16 LAB — CBC
HCT: 18.5 % — ABNORMAL LOW (ref 39.0–52.0)
MCHC: 34.6 g/dL (ref 30.0–36.0)
RDW: 36.4 % — ABNORMAL HIGH (ref 11.5–15.5)

## 2012-02-16 LAB — CREATININE, SERUM: GFR calc non Af Amer: >90 mL/min (ref >90)

## 2012-02-16 LAB — BASIC METABOLIC PANEL
Calcium: 8.5 mg/dL (ref 8.4–10.5)
Creatinine, Ser: 0.89 mg/dL (ref 0.50–1.35)
GFR calc Af Amer: >90 mL/min (ref >90)
GFR calc non Af Amer: >90 mL/min (ref >90)

## 2012-02-16 LAB — RETICULOCYTES: Retic Ct Pct: 46.8 % — ABNORMAL HIGH (ref 0.4–3.1)

## 2012-02-16 LAB — PRO B NATRIURETIC PEPTIDE: Pro B Natriuretic peptide (BNP): 742.6 pg/mL — ABNORMAL HIGH (ref 0–125)

## 2012-02-16 LAB — URINALYSIS, DIPSTICK ONLY
Glucose, UA: NEGATIVE mg/dL
Ketones, ur: NEGATIVE mg/dL
Leukocytes, UA: NEGATIVE
pH: 5.5 (ref 5.0–8.0)

## 2012-02-16 MED ORDER — ALUM & MAG HYDROXIDE-SIMETH 200-200-20 MG/5ML PO SUSP: 15.0000 mL | ORAL | Status: DC | PRN

## 2012-02-16 MED ORDER — DOCUSATE SODIUM 100 MG PO CAPS
100.0000 mg | ORAL_CAPSULE | Freq: Two times a day (BID) | ORAL | Status: DC
  Administered 2012-02-16: 100 mg via ORAL
  Filled 2012-02-16 (×3): qty 1

## 2012-02-16 MED ORDER — METHADONE HCL 10 MG PO TABS
10.0000 mg | ORAL_TABLET | Freq: Three times a day (TID) | ORAL | Status: DC
  Administered 2012-02-16 – 2012-02-27 (×32): 10 mg via ORAL
  Filled 2012-02-16 (×9): qty 1
  Filled 2012-02-16: qty 2
  Filled 2012-02-16 (×10): qty 1
  Filled 2012-02-16: qty 2
  Filled 2012-02-16 (×11): qty 1

## 2012-02-16 MED ORDER — HYDROMORPHONE HCL PF 2 MG/ML IJ SOLN
3.0000 mg | Freq: Once | INTRAMUSCULAR | Status: AC
  Administered 2012-02-16: 3 mg via INTRAVENOUS
  Filled 2012-02-16: qty 1

## 2012-02-16 MED ORDER — KETOROLAC TROMETHAMINE 30 MG/ML IJ SOLN
30.0000 mg | Freq: Four times a day (QID) | INTRAMUSCULAR | Status: DC
  Administered 2012-02-16 – 2012-02-19 (×10): 30 mg via INTRAVENOUS
  Filled 2012-02-16 (×17): qty 1

## 2012-02-16 MED ORDER — HYDROMORPHONE HCL PF 2 MG/ML IJ SOLN
3.0000 mg | INTRAMUSCULAR | Status: DC | PRN
  Administered 2012-02-17 (×3): 3 mg via INTRAVENOUS
  Filled 2012-02-16 (×3): qty 2

## 2012-02-16 MED ORDER — HYDROMORPHONE HCL PF 1 MG/ML IJ SOLN
INTRAMUSCULAR | Status: AC
  Filled 2012-02-16: qty 1

## 2012-02-16 MED ORDER — PANTOPRAZOLE SODIUM 40 MG PO TBEC
40.0000 mg | DELAYED_RELEASE_TABLET | Freq: Every day | ORAL | Status: DC
  Administered 2012-02-17 – 2012-02-22 (×4): 40 mg via ORAL
  Filled 2012-02-16 (×12): qty 1

## 2012-02-16 MED ORDER — HYDROMORPHONE HCL PF 2 MG/ML IJ SOLN
2.0000 mg | Freq: Once | INTRAMUSCULAR | Status: AC
  Administered 2012-02-16: 2 mg via INTRAVENOUS
  Filled 2012-02-16: qty 1

## 2012-02-16 MED ORDER — SODIUM CHLORIDE 0.45 % IV SOLN
INTRAVENOUS | Status: DC
  Administered 2012-02-16 – 2012-02-17 (×2): via INTRAVENOUS
  Administered 2012-02-17: 10 mL/h via INTRAVENOUS

## 2012-02-16 MED ORDER — ONDANSETRON HCL 4 MG/2ML IJ SOLN: 4.0000 mg | Freq: Three times a day (TID) | INTRAMUSCULAR | Status: DC | PRN

## 2012-02-16 MED ORDER — ENOXAPARIN SODIUM 100 MG/ML ~~LOC~~ SOLN
1.0000 mg/kg | SUBCUTANEOUS | Status: AC
  Administered 2012-02-16: 85 mg via SUBCUTANEOUS
  Filled 2012-02-16: qty 1

## 2012-02-16 MED ORDER — FOLIC ACID 1 MG PO TABS
1.0000 mg | ORAL_TABLET | Freq: Every day | ORAL | Status: DC
  Administered 2012-02-16 – 2012-02-27 (×12): 1 mg via ORAL
  Filled 2012-02-16 (×12): qty 1

## 2012-02-16 MED ORDER — POLYETHYLENE GLYCOL 3350 17 G PO PACK
17.0000 g | PACK | Freq: Every day | ORAL | Status: DC | PRN
  Filled 2012-02-16: qty 1

## 2012-02-16 MED ORDER — PIPERACILLIN-TAZOBACTAM 3.375 G IVPB 30 MIN
3.3750 g | Freq: Once | INTRAVENOUS | Status: AC
  Administered 2012-02-16: 3.375 g via INTRAVENOUS
  Filled 2012-02-16: qty 50

## 2012-02-16 MED ORDER — HYDROMORPHONE HCL PF 1 MG/ML IJ SOLN
1.0000 mg | INTRAMUSCULAR | Status: DC | PRN
  Administered 2012-02-16 – 2012-02-25 (×22): 1 mg via INTRAVENOUS
  Filled 2012-02-16 (×21): qty 1

## 2012-02-16 MED ORDER — MAGNESIUM SULFATE 40 MG/ML IJ SOLN
2.0000 g | Freq: Once | INTRAMUSCULAR | Status: AC
  Administered 2012-02-16: 2 g via INTRAVENOUS
  Filled 2012-02-16: qty 50

## 2012-02-16 MED ORDER — HYDROXYZINE HCL 25 MG PO TABS
25.0000 mg | ORAL_TABLET | ORAL | Status: DC | PRN
  Filled 2012-02-16: qty 2

## 2012-02-16 MED ORDER — FLEET ENEMA 7-19 GM/118ML RE ENEM: 1.0000 | ENEMA | Freq: Once | RECTAL | Status: AC | PRN

## 2012-02-16 MED ORDER — LEVOFLOXACIN IN D5W 750 MG/150ML IV SOLN
750.0000 mg | INTRAVENOUS | Status: AC
  Administered 2012-02-16 – 2012-02-18 (×3): 750 mg via INTRAVENOUS
  Filled 2012-02-16 (×4): qty 150

## 2012-02-16 MED ORDER — ADENOSINE 6 MG/2ML IV SOLN
INTRAVENOUS | Status: AC
  Filled 2012-02-16: qty 8

## 2012-02-16 MED ORDER — SALINE SPRAY 0.65 % NA SOLN
1.0000 | NASAL | Status: DC | PRN
  Filled 2012-02-16: qty 44

## 2012-02-16 MED ORDER — SORBITOL 70 % SOLN
30.0000 mL | Freq: Every day | Status: DC | PRN
  Filled 2012-02-16: qty 30

## 2012-02-16 MED ORDER — ALBUTEROL SULFATE (5 MG/ML) 0.5% IN NEBU: 2.5000 mg | INHALATION_SOLUTION | RESPIRATORY_TRACT | Status: DC | PRN

## 2012-02-16 MED ORDER — ONDANSETRON HCL 4 MG/2ML IJ SOLN
4.0000 mg | Freq: Once | INTRAMUSCULAR | Status: AC
  Administered 2012-02-16: 4 mg via INTRAVENOUS
  Filled 2012-02-16: qty 2

## 2012-02-16 MED ORDER — VANCOMYCIN HCL IN DEXTROSE 1-5 GM/200ML-% IV SOLN
1000.0000 mg | Freq: Three times a day (TID) | INTRAVENOUS | Status: DC
  Administered 2012-02-16 – 2012-02-18 (×5): 1000 mg via INTRAVENOUS
  Filled 2012-02-16 (×7): qty 200

## 2012-02-16 MED ORDER — HYDROMORPHONE HCL PF 1 MG/ML IJ SOLN
1.0000 mg | Freq: Once | INTRAMUSCULAR | Status: AC
  Administered 2012-02-16: 1 mg via INTRAVENOUS
  Filled 2012-02-16: qty 1

## 2012-02-16 MED ORDER — ONDANSETRON HCL 4 MG PO TABS: 4.0000 mg | ORAL_TABLET | ORAL | Status: DC | PRN

## 2012-02-16 MED ORDER — ADENOSINE 6 MG/2ML IV SOLN
6.0000 mg | Freq: Once | INTRAVENOUS | Status: AC
  Administered 2012-02-16: 6 mg via INTRAVENOUS
  Filled 2012-02-16: qty 2

## 2012-02-16 MED ORDER — ONDANSETRON HCL 4 MG/2ML IJ SOLN: 4.0000 mg | INTRAMUSCULAR | Status: DC | PRN

## 2012-02-16 MED ORDER — HYDROCODONE-ACETAMINOPHEN 5-325 MG PO TABS: 1.0000 | ORAL_TABLET | ORAL | Status: DC | PRN

## 2012-02-16 MED ORDER — ENOXAPARIN SODIUM 40 MG/0.4ML ~~LOC~~ SOLN: 40.0000 mg | SUBCUTANEOUS | Status: DC

## 2012-02-16 MED ORDER — DIPHENHYDRAMINE HCL 50 MG/ML IJ SOLN
25.0000 mg | Freq: Once | INTRAMUSCULAR | Status: AC
  Administered 2012-02-16: 25 mg via INTRAVENOUS
  Filled 2012-02-16: qty 1

## 2012-02-16 MED ORDER — ENOXAPARIN SODIUM 40 MG/0.4ML ~~LOC~~ SOLN
40.0000 mg | SUBCUTANEOUS | Status: DC
  Administered 2012-02-18 – 2012-02-22 (×2): 40 mg via SUBCUTANEOUS
  Filled 2012-02-16 (×11): qty 0.4

## 2012-02-16 MED ORDER — DEXTROSE 5 % IV SOLN
1.0000 g | Freq: Three times a day (TID) | INTRAVENOUS | Status: DC
  Administered 2012-02-16 – 2012-02-19 (×8): 1 g via INTRAVENOUS
  Filled 2012-02-16 (×11): qty 1

## 2012-02-16 MED ORDER — IBUPROFEN 800 MG PO TABS
800.0000 mg | ORAL_TABLET | Freq: Three times a day (TID) | ORAL | Status: DC | PRN
  Filled 2012-02-16: qty 1

## 2012-02-16 MED ORDER — ENOXAPARIN SODIUM 100 MG/ML ~~LOC~~ SOLN
1.0000 mg/kg | SUBCUTANEOUS | Status: AC
  Administered 2012-02-17: 85 mg via SUBCUTANEOUS
  Filled 2012-02-16: qty 1

## 2012-02-16 NOTE — H&P (Addendum)
[facility] History and Physical  [name] WUJ:[id number] DOB: February 26, 1970 DOA: 02/16/2012  Referring physician: Dr Silverio Lay PCP: August Saucer, ERIC, MD  Specialists: None  Chief Complaint: Hypoxia/sickle cell crisis  HPI: [name] is a 42 y.o. male African American with a history of sickle cell anemia, diastolic CHF last 2-D echo 09/21/2007 with EF of 55-65%, chronic fatigue syndrome, gouty arthropathy, last hospitalization 12/07/2011 to 12/09/2011 4 sickle cell crisis who presents to the ED from his PCPs office with hypoxia with sats of 83% per ED report. Patient stated that went to see his PCP for followup appointment and he had started to develop a crisis with bilateral hip pain and knee pain 3 days prior to admission. Patient does endorse some shortness of breath on exertion when he and legs up or down stairs, subjective fevers, sneezing, bilateral hip and knee pain, constipation. Patient denies any chest pain, no nausea, no vomiting, no cough, no sore throat, no abdominal pain, no diarrhea, no dysuria, no weakness. Patient does endorse nocturia as well as congestion. Patient was seen in the ED with initial sats of 88% on room. Patient also noted to be in SVT with heart rate going up as high as 181 was given 6 mg of adenosine and would return to a sinus tachycardia with heart rate in the 90s to low 100s. CBC obtained showed a white count of 23.5 hemoglobin of 6.5. Chest x-ray was worrisome for pneumonia versus edema. One unit of packed red blood cells ordered to be transfused. Will call to admit the patient for further evaluation and management.  Review of Systems: The patient denies anorexia, fever, weight loss,, vision loss, decreased hearing, hoarseness, chest pain, syncope, dyspnea on exertion, peripheral edema, balance deficits, hemoptysis, abdominal pain, melena, hematochezia, severe indigestion/heartburn, hematuria, incontinence, genital sores, muscle weakness, suspicious skin lesions,  transient blindness, difficulty walking, depression, unusual weight change, abnormal bleeding, enlarged lymph nodes, angioedema, and breast masses.    Past Medical History  Diagnosis Date  . CHF (congestive heart failure)   . Gouty arthropathy   . Chronic fatigue syndrome   . Sickle cell anemia    Past Surgical History  Procedure Date  . Appendectomy   . Cholecystectomy   . Splenectomy    Social History:  reports that he has never smoked. He does not have any smokeless tobacco history on file. He reports that he does not drink alcohol or use illicit drugs.  No Known Allergies  Family History  Problem Relation Age of Onset  . Sickle cell anemia    . Sickle cell trait Mother     Prior to Admission medications   Medication Sig Start Date End Date Taking? Authorizing Provider  ibuprofen (ADVIL,MOTRIN) 200 MG tablet Take 400-800 mg by mouth every 6 (six) hours as needed. For pain   Yes Historical Provider, MD  methadone (DOLOPHINE) 10 MG tablet Take 1 tablet (10 mg total) by mouth 3 (three) times daily. 08/14/11  Yes Grayce Sessions, NP  sodium chloride (OCEAN) 0.65 % nasal spray Place 1 spray into the nose as needed. For nasal congestion   Yes Historical Provider, MD  zolpidem (AMBIEN) 10 MG tablet Take 10 mg by mouth at bedtime as needed. For sleep.   Yes Historical Provider, MD   Physical Exam: Filed Vitals:   02/16/12 1700 02/16/12 1715 02/16/12 1730 02/16/12 1815  BP: 125/67     Pulse: 98 98 92   Temp:      TempSrc:  Resp: 23 19 22    Height:    5\' 10"  (1.778 m)  Weight:    84.823 kg (187 lb)  SpO2: 91% 95% 94%      General:  Well-developed well-nourished, speaking in full sentences.  Eyes: Icterus, pupils equal round and reactive to light and accommodation. Extraocular movements intact.  ENT: Oropharynx is clear, no lesions, no exudates.  Neck: Supple with no lymphadenopathy.  Cardiovascular: Tachycardic, no murmurs rubs or gallops. No lower extremity  edema.  Respiratory: Clear to auscultation bilaterally.  Abdomen: Right abdominal scar noted. Soft, nontender, nondistended, positive bowel sounds.  Skin: No rashes or lesions.  Musculoskeletal: 5 out of 5 bilateral upper extremity strength. 5 out of 5 bilateral lower extremity strength.  Psychiatric: Normal mood. Normal affect. Fair insight. Fair judgment.  Neurologic: Alert and oriented x3. Cranial nerves II through XII are grossly intact. No focal deficits.  Labs on Admission:  Basic Metabolic Panel:  Lab 02/16/12 1610 02/16/12 1253  NA 139 140  K 4.2 4.3  CL 109 109  CO2 23 23  GLUCOSE 85 95  BUN 14 15  CREATININE 0.89 0.90  CALCIUM 8.5 8.7  MG -- --  PHOS -- --   Liver Function Tests:  Lab 02/16/12 1253  AST 152*  ALT 51  ALKPHOS 116  BILITOT 9.2*  PROT 7.3  ALBUMIN 2.8*   No results found for this basename: LIPASE:5,AMYLASE:5 in the last 168 hours No results found for this basename: AMMONIA:5 in the last 168 hours CBC:  Lab 02/16/12 1253  WBC 23.5*  NEUTROABS 12.0*  HGB 6.5*  HCT 18.2*  MCV 98.9  PLT 268   Cardiac Enzymes: No results found for this basename: CKTOTAL:5,CKMB:5,CKMBINDEX:5,TROPONINI:5 in the last 168 hours  BNP (last 3 results)  Basename 02/16/12 1511 12/09/11 0755 08/14/11 0722  PROBNP 742.6* 102.5 180.9*   CBG: No results found for this basename: GLUCAP:5 in the last 168 hours  Radiological Exams on Admission: Dg Chest 2 View  02/16/2012  *RADIOLOGY REPORT*  Clinical Data: Sickle cell crisis with shortness of breath and body aches.  Low O2 sats.  CHEST - 2 VIEW  Comparison: 12/09/2011.  Findings: Trachea is midline.  Heart is mildly enlarged.  There is mild diffuse bilateral air space disease.  No pleural fluid.  IMPRESSION: Mild diffuse bilateral air space disease which may be due to edema or pneumonia.   Original Report Authenticated By: Leanna Battles, M.D.     EKG: Independently reviewed. SVT  Assessment/Plan Principal  Problem:  *Acute respiratory distress Active Problems:  Gout  Chronic pain  Leukocytosis  Acute chest syndrome(517.3)  Sickle cell crisis  HCAP (healthcare-associated pneumonia)  Hypoxia  SVT (supraventricular tachycardia)  Anemia  Jaundice  #1 acute respiratory distress/hypoxia Questionable etiology. Likely multifactorial secondary to healthcare associated pneumonia, probable acute chest syndrome, symptomatic anemia. Patient's hemoglobin is 6.5. Chest x-ray worrisome for healthcare associated pneumonia. Patient with a leukocytosis with a white count of 23.5. ABG with a pH of 7.36 PCO2 of 41 PO2 of 91 and a bicarbonate of 23. Will check an influenza panel. Will check a urine and strep pneumococcus. Check a urine Legionella antigen. Check a magnesium level. Check blood cultures x2. Patient was recently hospitalized November of 2013. We'll place empirically on IV vancomycin, cefepime, Levaquin to cover for probable healthcare associated pneumonia. Place on oxygen. IV fluids. CT angiogram of the chest is pending. Follow.  #2 healthcare associated pneumonia/acute chest syndrome Chest x-ray worrisome for healthcare associated pneumonia. Patient  with hypoxia at PCPs office of 83% on room air. Patient does have a leukocytosis with a white count of 23.5. Check a sputum Gram stain and culture. Will check a urine Legionella antigen. Check a urine pneumococcus antigen. Check an influenza panel. Placed empirically on IV vancomycin, cefepime, Levaquin. Continue oxygen. Follow.  #3 symptomatic anemia Patient is presenting with a hemoglobin of 6.5. Likely secondary to sickle cell crisis. No overt GI bleed. Patient's baseline hemoglobin is anywhere from 7.8 to about 8.8. Patient presented with hypoxia with sats of 83%. Patient also no acute sickle cell crisis with elevated reticulocyte count. Transfuse one unit of packed red blood cells. Follow H&H.  #4 acute sickle cell crisis Likely tipped off by a  healthcare associated pneumonia. Will check a urine Legionella antigen. Check a urine pneumococcus antigen. Check a sputum Gram stain and culture. Check blood cultures x2. Check a UA with cultures and sensitivities. Place on IV fluids, supportive care, pain management. Follow.  #5 SVT Likely secondary to problem #12 and 3. Patient was given 6 mg of adenosine and with conversion back to sinus rhythm. Blood cultures are pending. Check a magnesium level. See problem #2 and 3. Will repeat EKG. We'll cycle cardiac enzymes every 6 hours x3. Check a 2-D echo. Follow.  #6 leukocytosis Likely secondary to healthcare associated pneumonia. Will check blood cultures x2. Check a UA with cultures and sensitivity. Placed empirically on IV vancomycin, Levaquin and cefepime.  #7 gout Stable. Follow.  #8 jaundice/scleral icterus Likely secondary to hemolysis secondary to acute sickle cell crisis. Patient's bilirubin is elevated at 9.2. Patient is status post cholecystectomy. Will check a direct and indirect bilirubin. If direct bilirubin is elevated will need to check an acute hepatitis panel.  #9 prophylaxis Lovenox for DVT prophylaxis.    Code Status: Full Family Communication: Updated patient. No family at bedside. Disposition Plan: Admit to the step down unit.  Time spent: 70 mins  [facility] Pager [phone]  If 7PM-7AM, please contact night-coverage [email or web address] Password [facility] 02/16/2012, 7:05 PM

## 2012-02-16 NOTE — ED Notes (Signed)
Pt reports bila hip and knee pain since Saturday.  Pt reports this is where he normally has pain when he has crisis.  Pt denies any cp or SOB or diff breathing at this time.

## 2012-02-16 NOTE — Progress Notes (Addendum)
ANTIBIOTIC CONSULT NOTE - INITIAL  Pharmacy Consult for Vancomycin Indication: pneumonia  No Known Allergies  Patient Measurements: Height: 5\' 10"  (177.8 cm) Weight: 187 lb (84.823 kg) IBW/kg (Calculated) : 73  Adjusted Body Weight:   Vital Signs: Temp: 98.4 F (36.9 C) (01/13 1153) Temp src: Oral (01/13 1153) BP: 125/67 mmHg (01/13 1700) Pulse Rate: 92  (01/13 1730) Intake/Output from previous day:   Intake/Output from this shift:    Labs:  Basename 02/16/12 1511 02/16/12 1253  WBC -- 23.5*  HGB -- 6.5*  PLT -- 268  LABCREA -- --  CREATININE 0.89 0.90   Estimated Creatinine Clearance: 113.9 ml/min (by C-G formula based on Cr of 0.89). No results found for this basename: VANCOTROUGH:2,VANCOPEAK:2,VANCORANDOM:2,GENTTROUGH:2,GENTPEAK:2,GENTRANDOM:2,TOBRATROUGH:2,TOBRAPEAK:2,TOBRARND:2,AMIKACINPEAK:2,AMIKACINTROU:2,AMIKACIN:2, in the last 72 hours   Microbiology: No results found for this or any previous visit (from the past 720 hour(s)).  Medical History: Past Medical History  Diagnosis Date  . CHF (congestive heart failure)   . Gouty arthropathy   . Chronic fatigue syndrome   . Sickle cell anemia     Medications:  Scheduled:    . [COMPLETED] adenosine (ADENOCARD) IV  6 mg Intravenous Once  . adenosine      . HYDROmorphone      . [COMPLETED]  HYDROmorphone (DILAUDID) injection  1 mg Intravenous Once  . [COMPLETED]  HYDROmorphone (DILAUDID) injection  2 mg Intravenous Once  . [COMPLETED]  HYDROmorphone (DILAUDID) injection  3 mg Intravenous Once  . [COMPLETED] ondansetron (ZOFRAN) IV  4 mg Intravenous Once  . vancomycin  1,000 mg Intravenous Q8H   Infusions:    . piperacillin-tazobactam     PRN:  Assessment:  42 yo M with suspected pneumonia  Hx- sickle cell anemia,gout,   Goal of Therapy:  Vancomycin trough level 15-20 mcg/ml  Plan:  1.  Begin vancomycin 1000mg  IV q 8 hours. Will begin with q 8 hours since patient has good clearance, his size,  and elevated WBC 23.5k Measure antibiotic drug levels at steady state Follow up culture results  Loletta Specter 02/16/2012,6:22 PM

## 2012-02-16 NOTE — ED Notes (Signed)
Admitting dr at bedside.  

## 2012-02-16 NOTE — ED Notes (Signed)
Went to collect labs in triage - pt would like to wait for IV

## 2012-02-16 NOTE — ED Notes (Signed)
Pt refused to have blood transfusion at this time, also refused any further blood draws, pt explained necessity, advised against it. MD notified.

## 2012-02-16 NOTE — ED Notes (Addendum)
Went to collect labs (ammonia) - pt refused until he gets something to eat.

## 2012-02-16 NOTE — ED Provider Notes (Signed)
History     CSN: [id number]  Arrival date & time 02/16/12  1111   First MD Initiated Contact with Patient 02/16/12 1215      Chief Complaint  Patient presents with  . Sickle Cell Pain Crisis  . Knee Pain  . Hip Pain    (Consider location/radiation/quality/duration/timing/severity/associated sxs/prior treatment) HPI  [name] is a 42 y.o. male with past medical history significant for sickle cell anemia and CHF sent to the ED by Dr. August Saucer. Patient is unsure why he was sent here. Patient reports an exacerbation of his typical pain with 5/10 pain in the bilateral hips and knees going on for 3 days. He denies any chest pain, shortness of breath, cough, abdominal pain, nausea vomiting, change in bowel or bladder habits.  He does endorse a subjective fever starting 3 days ago that is now resolved. Patient does not want to be in the emergency room and is very anxious to leave. He is not very forthcoming about history of present illness and is denying all symptoms on review of systems systems. He states that he is sent here by Dr. August Saucer for unknown reasons.  Past Medical History  Diagnosis Date  . CHF (congestive heart failure)   . Gouty arthropathy   . Chronic fatigue syndrome   . Sickle cell anemia     Past Surgical History  Procedure Date  . Appendectomy   . Cholecystectomy   . Splenectomy     Family History  Problem Relation Age of Onset  . Sickle cell anemia    . Sickle cell trait Mother     History  Substance Use Topics  . Smoking status: Never Smoker   . Smokeless tobacco: Not on file  . Alcohol Use: No      Review of Systems  Constitutional: Positive for fever.  Respiratory: Negative for shortness of breath.   Cardiovascular: Negative for chest pain.  Gastrointestinal: Negative for nausea, vomiting, abdominal pain and diarrhea.  Musculoskeletal: Positive for myalgias.  All other systems reviewed and are negative.    Allergies  Review of patient's  allergies indicates no known allergies.  Home Medications   Current Outpatient Rx  Name  Route  Sig  Dispense  Refill  . IBUPROFEN 200 MG PO TABS   Oral   Take 400-800 mg by mouth every 6 (six) hours as needed. For pain         . METHADONE HCL 10 MG PO TABS   Oral   Take 1 tablet (10 mg total) by mouth 3 (three) times daily.   60 tablet   0   . OXYCODONE-ACETAMINOPHEN 5-325 MG PO TABS   Oral   Take 1 tablet by mouth every 4 (four) hours as needed for pain.   30 tablet   0   . SODIUM CHLORIDE 0.65 % NA SOLN   Nasal   Place 1 spray into the nose as needed. For nasal congestion         . ZOLPIDEM TARTRATE 10 MG PO TABS   Oral   Take 10 mg by mouth at bedtime as needed. For sleep.           BP 125/69  Pulse 99  Temp 98.4 F (36.9 C) (Oral)  Resp 15  SpO2 90%  Physical Exam  Nursing note and vitals reviewed. Constitutional: He is oriented to person, place, and time. He appears well-developed and well-nourished. No distress.  HENT:  Head: Normocephalic.  Conjunctival pallor  Eyes: Conjunctivae normal and EOM are normal. Pupils are equal, round, and reactive to light.  Neck: JVD present.  Cardiovascular: Normal rate and intact distal pulses.   Pulmonary/Chest: Effort normal and breath sounds normal. No stridor. No respiratory distress. He has no wheezes. He has no rales. He exhibits no tenderness.  Abdominal: Soft. Bowel sounds are normal. He exhibits no distension and no mass. There is no tenderness. There is no rebound and no guarding.  Musculoskeletal: Normal range of motion. He exhibits no edema.  Neurological: He is alert and oriented to person, place, and time.  Psychiatric: He has a normal mood and affect.    ED Course  Procedures (including critical care time)  CRITICAL CARE Performed by: Wynetta Emery   Total critical care time: 20  Critical care time was exclusive of separately billable procedures and treating other  patients.  Critical care was necessary to treat or prevent imminent or life-threatening deterioration.  Critical care was time spent personally by me on the following activities: development of treatment plan with patient and/or surrogate as well as nursing, discussions with consultants, evaluation of patient's response to treatment, examination of patient, obtaining history from patient or surrogate, ordering and performing treatments and interventions, ordering and review of laboratory studies, ordering and review of radiographic studies, pulse oximetry and re-evaluation of patient's condition.  Labs Reviewed  CBC WITH DIFFERENTIAL - Abnormal; Notable for the following:    WBC 23.5 (*)  ADJUSTED FOR NUCLEATED RBC'S   RBC 1.84 (*)     Hemoglobin 6.5 (*)     HCT 18.2 (*)     MCH 35.3 (*)     RDW 35.9 (*)     Monocytes Relative 2 (*)     Basophils Relative 3 (*)     nRBC 88 (*)     Neutro Abs 12.0 (*)     Lymphs Abs 9.6 (*)     Basophils Absolute 0.7 (*)     All other components within normal limits  COMPREHENSIVE METABOLIC PANEL - Abnormal; Notable for the following:    Albumin 2.8 (*)     AST 152 (*)     Total Bilirubin 9.2 (*)     All other components within normal limits  RETICULOCYTES - Abnormal; Notable for the following:    Retic Ct Pct 46.8 (*)  RESULTS CONFIRMED BY MANUAL DILUTION   RBC. 1.84 (*)     Retic Count, Manual 861.1 (*)     All other components within normal limits  PRO B NATRIURETIC PEPTIDE - Abnormal; Notable for the following:    Pro B Natriuretic peptide (BNP) 742.6 (*)     All other components within normal limits  CBC - Abnormal; Notable for the following:    WBC 23.3 (*)     RBC 1.84 (*)     Hemoglobin 6.4 (*)  CRITICAL VALUE NOTED.  VALUE IS CONSISTENT WITH PREVIOUSLY REPORTED AND CALLED VALUE.   HCT 18.5 (*)     MCV 100.5 (*)     MCH 34.8 (*)     RDW 36.4 (*)     All other components within normal limits  URINALYSIS, DIPSTICK ONLY - Abnormal;  Notable for the following:    Hgb urine dipstick LARGE (*)     Bilirubin Urine MODERATE (*)     Protein, ur >300 (*)     Urobilinogen, UA 2.0 (*)     Nitrite POSITIVE (*)     All other components  within normal limits  TYPE AND SCREEN  BASIC METABOLIC PANEL  PREPARE RBC (CROSSMATCH)  BLOOD GAS, ARTERIAL  TROPONIN I  CK TOTAL AND CKMB  MAGNESIUM  CREATININE, SERUM  CULTURE, BLOOD (ROUTINE X 2)  CULTURE, BLOOD (ROUTINE X 2)  TROPONIN I  TROPONIN I  CK TOTAL AND CKMB  CK TOTAL AND CKMB  INFLUENZA PANEL BY PCR  AMMONIA  RETICULOCYTES  URINE CULTURE  HEPATIC FUNCTION PANEL  HEMOGLOBIN A1C  MAGNESIUM   Dg Chest 2 View  02/16/2012  *RADIOLOGY REPORT*  Clinical Data: Sickle cell crisis with shortness of breath and body aches.  Low O2 sats.  CHEST - 2 VIEW  Comparison: 12/09/2011.  Findings: Trachea is midline.  Heart is mildly enlarged.  There is mild diffuse bilateral air space disease.  No pleural fluid.  IMPRESSION: Mild diffuse bilateral air space disease which may be due to edema or pneumonia.   Original Report Authenticated By: Leanna Battles, M.D.     Date: 02/16/2012 15:58  Rate: 164  Rhythm: supraventricular tachycardia (SVT)  QRS Axis: normal  Intervals: normal  ST/T Wave abnormalities: nonspecific ST/T changes  Conduction Disutrbances:none  Narrative Interpretation:   Old EKG Reviewed: none available    1. Sickle cell crisis   2. Anemia   3. SVT (supraventricular tachycardia)   4. HCAP (healthcare-associated pneumonia)   5. Acute respiratory distress       MDM  Patient is not forthcoming with history of present illness. I'm concerned about his level of hypoxia which she states is normal for him.  Consult from Dr. August Saucer appreciated: He states that he is very concerned about the patient's hypoxia which in the office was at 83% patient also endorses shortness of breath earlier in the office today. he was also tachycardic at that time he feels this is likely an  exacerbation of his CHF. He also states that the anemia is atypical for him and transfusion.  Patient has leukocytosis of 23.5. He is anemic to 6.5 this is decreased from his normal level which is around 7.8. He was transfused at 6.0 before. He is hypoxic to 90%. This is improved since his saw Dr. August Saucer this a.m. at 83%. He denies chest pain or shortness of breath however I feel that this is because he does not want to be in the ED. Reticulocyte count is 48%. AST is also elevated.  Chest x-ray is read as bilateral diffuse airspace disease secondary to edema versus pneumonia. I am also concerned about pulmonary embolism and ordered a CTA.  Patient went into SVT, vagal maneuvers were unsuccessful. Patient was converted to sinus tach with 6 mg of adenosine.  Discussion with radiologist Dr. Mayford Knife appreciated a CT scan cannot be accomplished with a 22-gauge. Considering this patient will be started on broad-spectrum antibiotics were held her associated pneumonia since he was admitted in November. He was started on thank and Zosyn per pharmacy dosing. Blood cultures drawn before antibiotics are administered.  Discussed with atending Dr. Silverio Lay recommends no anticoagulation at this time, pending VQ scan. This is likely a pneumonia based on the high white cell count of 25.  Intensivist Dr. Frederico Hamman consulted for central line placement.   Patient will be admitted to a step down unit under the care of Dr. Charlesetta Shanks of the [facility] hospital[facility]  [facility], PA-C 02/17/12 [phone]

## 2012-02-17 ENCOUNTER — Inpatient Hospital Stay (HOSPITAL_COMMUNITY): Payer: BC Managed Care – PPO

## 2012-02-17 ENCOUNTER — Other Ambulatory Visit (HOSPITAL_COMMUNITY): Payer: BC Managed Care – PPO

## 2012-02-17 DIAGNOSIS — M25561 Pain in right knee: Secondary | ICD-10-CM | POA: Insufficient documentation

## 2012-02-17 LAB — APTT: aPTT: 47 seconds — ABNORMAL HIGH (ref 24–37)

## 2012-02-17 LAB — CBC WITH DIFFERENTIAL/PLATELET
Blasts: 0 %
HCT: 22.2 % — ABNORMAL LOW (ref 39.0–52.0)
Hemoglobin: 7.7 g/dL — ABNORMAL LOW (ref 13.0–17.0)
Lymphocytes Relative: 40 % (ref 12–46)
Lymphs Abs: 9 10*3/uL — ABNORMAL HIGH (ref 0.7–4.0)
MCHC: 34.7 g/dL (ref 30.0–36.0)
Monocytes Absolute: 1.1 10*3/uL — ABNORMAL HIGH (ref 0.1–1.0)
Monocytes Relative: 5 % (ref 3–12)
Neutro Abs: 10.8 10*3/uL — ABNORMAL HIGH (ref 1.7–7.7)
Neutrophils Relative %: 48 % (ref 43–77)
Promyelocytes Absolute: 0 %
RDW: 32.8 % — ABNORMAL HIGH (ref 11.5–15.5)
WBC: 22.4 10*3/uL — ABNORMAL HIGH (ref 4.0–10.5)
nRBC: 0 /100 WBC

## 2012-02-17 LAB — HEMOGLOBIN A1C: Hgb A1c MFr Bld: <4 % (ref <5.7)

## 2012-02-17 LAB — COMPREHENSIVE METABOLIC PANEL
AST: 132 U/L — ABNORMAL HIGH (ref 0–37)
BUN: 16 mg/dL (ref 6–23)
CO2: 23 mEq/L (ref 19–32)
Calcium: 8.5 mg/dL (ref 8.4–10.5)
Chloride: 107 mEq/L (ref 96–112)
Creatinine, Ser: 1.16 mg/dL (ref 0.50–1.35)
GFR calc Af Amer: 90 mL/min — ABNORMAL LOW (ref >90)
GFR calc non Af Amer: 77 mL/min — ABNORMAL LOW (ref >90)
Glucose, Bld: 87 mg/dL (ref 70–99)
Total Bilirubin: 5.4 mg/dL — ABNORMAL HIGH (ref 0.3–1.2)

## 2012-02-17 LAB — URINE CULTURE: Colony Count: NO GROWTH

## 2012-02-17 LAB — PROTIME-INR: INR: 1.41 (ref 0.00–1.49)

## 2012-02-17 LAB — TSH: TSH: 3.724 u[IU]/mL (ref 0.350–4.500)

## 2012-02-17 LAB — LEGIONELLA ANTIGEN, URINE: Legionella Antigen, Urine: NEGATIVE

## 2012-02-17 LAB — CK TOTAL AND CKMB (NOT AT ARMC)
CK, MB: 1.3 ng/mL (ref 0.3–4.0)
Total CK: 33 U/L (ref 7–232)

## 2012-02-17 LAB — MRSA PCR SCREENING: MRSA by PCR: POSITIVE — AB

## 2012-02-17 LAB — RETICULOCYTES: RBC.: 2.26 MIL/uL — ABNORMAL LOW (ref 4.22–5.81)

## 2012-02-17 LAB — STREP PNEUMONIAE URINARY ANTIGEN: Strep Pneumo Urinary Antigen: NEGATIVE

## 2012-02-17 LAB — INFLUENZA PANEL BY PCR (TYPE A & B)
Influenza A By PCR: NEGATIVE
Influenza B By PCR: NEGATIVE

## 2012-02-17 MED ORDER — FUROSEMIDE 10 MG/ML IJ SOLN
20.0000 mg | Freq: Once | INTRAMUSCULAR | Status: AC
  Administered 2012-02-17: 20 mg via INTRAVENOUS
  Filled 2012-02-17: qty 2

## 2012-02-17 MED ORDER — HYDROCODONE-ACETAMINOPHEN 5-325 MG PO TABS
1.0000 | ORAL_TABLET | ORAL | Status: DC
  Administered 2012-02-17 – 2012-02-25 (×38): 1 via ORAL
  Filled 2012-02-17 (×42): qty 1

## 2012-02-17 MED ORDER — CHLORHEXIDINE GLUCONATE CLOTH 2 % EX PADS
6.0000 | MEDICATED_PAD | Freq: Every day | CUTANEOUS | Status: AC
  Administered 2012-02-17: 6 via TOPICAL

## 2012-02-17 MED ORDER — SENNOSIDES-DOCUSATE SODIUM 8.6-50 MG PO TABS
1.0000 | ORAL_TABLET | Freq: Two times a day (BID) | ORAL | Status: DC
  Administered 2012-02-17 (×2): 1 via ORAL
  Filled 2012-02-17 (×7): qty 1

## 2012-02-17 MED ORDER — FUROSEMIDE 10 MG/ML IJ SOLN
20.0000 mg | Freq: Once | INTRAMUSCULAR | Status: AC
  Administered 2012-02-18: 20 mg via INTRAVENOUS
  Filled 2012-02-17: qty 2

## 2012-02-17 MED ORDER — MUPIROCIN 2 % EX OINT
1.0000 "application " | TOPICAL_OINTMENT | Freq: Two times a day (BID) | CUTANEOUS | Status: AC
  Administered 2012-02-17 – 2012-02-21 (×6): 1 via NASAL
  Filled 2012-02-17 (×2): qty 22

## 2012-02-17 NOTE — Progress Notes (Signed)
42 year old AAM admitted on 02/16/2012 with sickle cell crisis, + MRSA per nasal swab. Has hx of cardiomegaly, diastolic CHF. Received adenosine in ED for SVT 180s, hgb 6.5, refused blood products initially. Received one unit PRBC on night shift, hgb 7.7 afterward. IVF infusing, pain managed with dilaudid iv, vicodin po, and toradol. Echo performed this pm. Orders to transfer to 4W.

## 2012-02-17 NOTE — Progress Notes (Signed)
[id number]/Stephano Arrants, RN, BSN, CCM:  CHART REVIEWED AND UPDATED.  Next chart review due on [id number]. NO DISCHARGE NEEDS PRESENT AT THIS TIME. CASE MANAGEMENT [phone] 

## 2012-02-17 NOTE — ED Provider Notes (Signed)
Medical screening examination/treatment/procedure(s) were conducted as a shared visit with non-physician practitioner(s) and myself.  I personally evaluated the patient during the encounter  [name] is a 42 y.o. male hx of sickle cell disease, CHF here with chest pain and SOB. Was found to be hypoxic. Also he went into SVT and was converted with adenosine (see below). CXR showed diffuse bilateral air space disease. I treated him with vanc/zosyn with presumed health care associated pneumonia. Also considered acute chest but unable to get sufficient access for CT angiogram. So he was admitted and VQ scan will be done inpatient.    Cardioversion procedure note Patient consented verbally. HR at 180s and was placed on monitor and pads placed. 6 mg adenosine given followed by saline push. Subsequently, the HR went down to 110s and was in sinus tachycardia. Patient tolerated the procedure with no complications.     Richardean Canal, MD 02/17/12 [phone]

## 2012-02-17 NOTE — Progress Notes (Signed)
Pt reports that he has had episodes where his "heart races" and he can feel it "beating fast and hard in his chest". Pt came in ED and HR was elevated ~180s? and adenosine was given. Pt reports that heart has felt this way outside of hospital, and sometimes last 4, 15, or 45 minutes. Pt was explained the purpose of the adenosine administration in ED and encouraged to discuss this cardiac issue with his physician. Could benefit from cardiologist consult.

## 2012-02-17 NOTE — Progress Notes (Signed)
SICKLE CELL SERVICE PROGRESS NOTE  Daelan Gatt ZOX:[id number] DOB: 1970/10/01 DOA: 02/16/2012 PCP: August Saucer ERIC, MD  Assessment/Plan: Active Problems:  Sickle cell crisis: Pt feels that his pain is under good control and agrees with  to transition to oral medications with IV for breakthrough. Will stop IVF   HCAP (healthcare-associated pneumonia) vs. Pulmonary Edema: Clinically patient is not ill appearing. His CXR shows an interstitial pattern. This could represent Pneumonia vs Pulmonary edema vs. Acute Chest syndrome.  In light of his clinical course, I will give a trial of IV lasix and recheck CXR. In light of persistently elevated WBC, will continue broad spectrum antibiotics.    Acute respiratory distress: Improved. Pt on 2 L/min of oxygen. Check sats on RA.   Leukocytosis: May be related to crisis, but may also be related to a pneumonia. Checking procalcitonin.   Acute chest syndrome(517.3): It is unclear that patient has an Acute Chest Syndrome as clinical course still unfolding. However it remains in the differential.   SVT (supraventricular tachycardia): Pt had an episode of SVT treated with Adenosine. His HR is now in the 80's. Pt relates that he has intermittent tachycardia that is not related to pain. He has not been evaluated for this. Will continue to monitor him on telemetry and speak with Cardiology about possible event or Holter monitor.   Anemia: Pt s/p transfusion of 1 unit PRBC. Now at basline. Indications for transfusion were SVT and Hypoxemia.   Code Status: Full Family Communication: N/A Disposition Plan: Home at time of discharge.  Baldemar Dady A.  Pager [phone]. If 7PM-7AM, please contact night-coverage. 02/17/2012, 10:55 AM  LOS: 1 day   Brief narrative: Benjimin Hadden is a 42 y.o. male African American with a history of sickle cell anemia, diastolic CHF last 2-D echo 09/21/2007 with EF of 55-65%, chronic fatigue syndrome, gouty arthropathy, last  hospitalization 12/07/2011 to 12/09/2011 4 sickle cell crisis who presents to the ED from his PCPs office with hypoxia with sats of 83% per ED report. Patient stated that went to see his PCP for followup appointment and he had started to develop a crisis with bilateral hip pain and knee pain 3 days prior to admission. Patient does endorse some shortness of breath on exertion when he and legs up or down stairs, subjective fevers, sneezing, bilateral hip and knee pain, constipation. Patient denies any chest pain, no nausea, no vomiting, no cough, no sore throat, no abdominal pain, no diarrhea, no dysuria, no weakness. Patient does endorse nocturia as well as congestion. Patient was seen in the ED with initial sats of 88% on room. Patient also noted to be in SVT with heart rate going up as high as 181 was given 6 mg of adenosine and would return to a sinus tachycardia with heart rate in the 90s to low 100s. CBC obtained showed a white count of 23.5 hemoglobin of 6.5. Chest x-ray was worrisome for pneumonia versus edema. One unit of packed red blood cells ordered to be transfused. Will call to admit the patient for further evaluation and management   Consultants:  None  Procedures:  None  Antibiotics:  Cefepime 1/13 >>  Levaquin 1/13 >>  Vancomycin 1/13 >>  HPI/Subjective: Pt states that he feels much better and believes that he was in crisis over the weekend and is about at the end of it. He rates his pain as 5/10 and is requesting transition to his oral medications.  Objective: Filed Vitals:   02/17/12 0500 02/17/12 0600 02/17/12  0700 02/17/12 0800  BP: 110/70 117/74 108/60   Pulse: 70 72 85   Temp:    97.8 F (36.6 C)  TempSrc:    Oral  Resp: 17 17 17    Height:      Weight:      SpO2: 98% 97% 96%    Weight change:   Intake/Output Summary (Last 24 hours) at 02/17/12 1055 Last data filed at 02/17/12 0700  Gross per 24 hour  Intake 2847.92 ml  Output    250 ml  Net 2597.92 ml     General: Alert, awake, oriented x3, in no acute distress. Well appearing HEENT: [place]/AT PEERL, EOMI, Anicteric Heart: Regular rate and rhythm, without murmurs, rubs, gallops, PMI non-displaced, no heaves or thrills on palpation.  Lungs: Clear to auscultation, no wheezing or rhonchi noted.  Abdomen: Soft, nontender, nondistended, positive bowel sounds, no masses no hepatosplenomegaly noted. Well healed scar across abdomen.  Neuro: No focal neurological deficits noted cranial nerves II through XII grossly intact.  Strength normal in bilateral upper and lower extremities. Musculoskeletal: No warm swelling or erythema around joints, no spinal tenderness noted. Psychiatric: Patient alert and oriented x3, good insight and cognition, good recent to remote recall.    Data Reviewed: Basic Metabolic Panel:  Lab 02/17/12 1610 02/16/12 1825 02/16/12 1511 02/16/12 1253  NA 138 -- 139 140  K 4.6 -- 4.2 4.3  CL 107 -- 109 109  CO2 23 -- 23 23  GLUCOSE 87 -- 85 95  BUN 16 -- 14 15  CREATININE 1.16 0.85 0.89 0.90  CALCIUM 8.5 -- 8.5 8.7  MG 2.5 1.8 -- --  PHOS -- -- -- --   Liver Function Tests:  Lab 02/17/12 0540 02/16/12 1253  AST 132* 152*  ALT 50 51  ALKPHOS 108 116  BILITOT 5.4* 9.2*  PROT 7.0 7.3  ALBUMIN 2.5* 2.8*   No results found for this basename: LIPASE:5,AMYLASE:5 in the last 168 hours  Lab 02/16/12 2215  AMMONIA 83*   CBC:  Lab 02/17/12 0540 02/16/12 1825 02/16/12 1253  WBC 22.4* 23.3* 23.5*  NEUTROABS 10.8* -- 12.0*  HGB 7.7* 6.4* 6.5*  HCT 22.2* 18.5* 18.2*  MCV 98.2 100.5* 98.9  PLT 284 286 268   Cardiac Enzymes:  Lab 02/17/12 0540 02/16/12 2215 02/16/12 1812  CKTOTAL 29 34 36  CKMB 1.3 1.2 1.2  CKMBINDEX -- -- --  TROPONINI <0.30 <0.30 <0.30   BNP (last 3 results)  Basename 02/16/12 1511 12/09/11 0755 08/14/11 0722  PROBNP 742.6* 102.5 180.9*   CBG: No results found for this basename: GLUCAP:5 in the last 168 hours  Recent Results (from the past  240 hour(s))  CULTURE, BLOOD (ROUTINE X 2)     Status: Normal (Preliminary result)   Collection Time   02/16/12  6:05 PM      Component Value Range Status Comment   Specimen Description BLOOD RIGHT HAND   Final    Special Requests NONE BOTTLES DRAWN AEROBIC AND ANAEROBIC 5CC   Final    Culture  Setup Time 02/16/2012 22:22   Final    Culture     Final    Value:        BLOOD CULTURE RECEIVED NO GROWTH TO DATE CULTURE WILL BE HELD FOR 5 DAYS BEFORE ISSUING A FINAL NEGATIVE REPORT   Report Status PENDING   Incomplete   CULTURE, BLOOD (ROUTINE X 2)     Status: Normal (Preliminary result)   Collection Time   02/16/12  6:12 PM      Component Value Range Status Comment   Specimen Description BLOOD RIGHT HAND   Final    Special Requests NONE BOTTLES DRAWN AEROBIC AND ANAEROBIC 5CC   Final    Culture  Setup Time 02/16/2012 22:22   Final    Culture     Final    Value:        BLOOD CULTURE RECEIVED NO GROWTH TO DATE CULTURE WILL BE HELD FOR 5 DAYS BEFORE ISSUING A FINAL NEGATIVE REPORT   Report Status PENDING   Incomplete   MRSA PCR SCREENING     Status: Abnormal   Collection Time   02/16/12  9:10 PM      Component Value Range Status Comment   MRSA by PCR POSITIVE (*) NEGATIVE Final      Studies: Dg Chest 2 View  02/16/2012  *RADIOLOGY REPORT*  Clinical Data: Sickle cell crisis with shortness of breath and body aches.  Low O2 sats.  CHEST - 2 VIEW  Comparison: 12/09/2011.  Findings: Trachea is midline.  Heart is mildly enlarged.  There is mild diffuse bilateral air space disease.  No pleural fluid.  IMPRESSION: Mild diffuse bilateral air space disease which may be due to edema or pneumonia.   Original Report Authenticated By: Leanna Battles, M.D.     Scheduled Meds:   . ceFEPime (MAXIPIME) IV  1 g Intravenous Q8H  . Chlorhexidine Gluconate Cloth  6 each Topical Q0600  . enoxaparin (LOVENOX) injection  40 mg Subcutaneous Q24H  . folic acid  1 mg Oral Daily  . HYDROcodone-acetaminophen  1  tablet Oral Q4H  . ketorolac  30 mg Intravenous Q6H  . levofloxacin (LEVAQUIN) IV  750 mg Intravenous Q24H  . methadone  10 mg Oral TID  . mupirocin ointment  1 application Nasal BID  . pantoprazole  40 mg Oral Q0600  . senna-docusate  1 tablet Oral BID  . vancomycin  1,000 mg Intravenous Q8H   Continuous Infusions:   . sodium chloride 100 mL/hr at 02/17/12 0902    Active Problems:  Gout  Chronic pain  Leukocytosis  Acute chest syndrome(517.3)  Sickle cell crisis  Acute respiratory distress  HCAP (healthcare-associated pneumonia)  Hypoxia  SVT (supraventricular tachycardia)  Anemia  Jaundice

## 2012-02-17 NOTE — Progress Notes (Signed)
Echocardiogram 2D Echocardiogram has been performed.  Villa Burgin 02/17/2012, 12:57 PM

## 2012-02-17 NOTE — Progress Notes (Signed)
RN went ahead and canceled CKMB and CK total lab draw for tonight. Patient's past 3 have been within normal limits. Patient is not fond of being stuck multiple times. The troponin order has already been discontinued.

## 2012-02-18 ENCOUNTER — Inpatient Hospital Stay (HOSPITAL_COMMUNITY): Payer: BC Managed Care – PPO

## 2012-02-18 DIAGNOSIS — N179 Acute kidney failure, unspecified: Secondary | ICD-10-CM | POA: Diagnosis not present

## 2012-02-18 LAB — COMPREHENSIVE METABOLIC PANEL
Albumin: 2.7 g/dL — ABNORMAL LOW (ref 3.5–5.2)
Alkaline Phosphatase: 104 U/L (ref 39–117)
BUN: 26 mg/dL — ABNORMAL HIGH (ref 6–23)
Creatinine, Ser: 1.44 mg/dL — ABNORMAL HIGH (ref 0.50–1.35)
Potassium: 6 mEq/L — ABNORMAL HIGH (ref 3.5–5.1)
Total Protein: 7.2 g/dL (ref 6.0–8.3)

## 2012-02-18 LAB — CK TOTAL AND CKMB (NOT AT ARMC)
Relative Index: INVALID (ref 0.0–2.5)
Total CK: 43 U/L (ref 7–232)
Total CK: 51 U/L (ref 7–232)

## 2012-02-18 LAB — BASIC METABOLIC PANEL
BUN: 30 mg/dL — ABNORMAL HIGH (ref 6–23)
Calcium: 8.2 mg/dL — ABNORMAL LOW (ref 8.4–10.5)
GFR calc non Af Amer: 54 mL/min — ABNORMAL LOW (ref >90)
Glucose, Bld: 86 mg/dL (ref 70–99)

## 2012-02-18 LAB — CBC WITH DIFFERENTIAL/PLATELET
Basophils Absolute: 0.3 10*3/uL — ABNORMAL HIGH (ref 0.0–0.1)
Lymphs Abs: 5.2 10*3/uL — ABNORMAL HIGH (ref 0.7–4.0)
MCH: 34.6 pg — ABNORMAL HIGH (ref 26.0–34.0)
MCV: 98.1 fL (ref 78.0–100.0)
Monocytes Absolute: 1.4 10*3/uL — ABNORMAL HIGH (ref 0.1–1.0)
Platelets: 183 10*3/uL (ref 150–400)
RDW: 30.8 % — ABNORMAL HIGH (ref 11.5–15.5)

## 2012-02-18 LAB — VANCOMYCIN, TROUGH: Vancomycin Tr: 30.3 ug/mL (ref 10.0–20.0)

## 2012-02-18 LAB — TYPE AND SCREEN: Unit division: 0

## 2012-02-18 LAB — PROCALCITONIN: Procalcitonin: 0.78 ng/mL

## 2012-02-18 MED ORDER — METOPROLOL TARTRATE 1 MG/ML IV SOLN
5.0000 mg | Freq: Once | INTRAVENOUS | Status: AC
  Administered 2012-02-18: 5 mg via INTRAVENOUS

## 2012-02-18 MED ORDER — SODIUM BICARBONATE 8.4 % IV SOLN
50.0000 meq | Freq: Once | INTRAVENOUS | Status: AC
  Administered 2012-02-18: 50 meq via INTRAVENOUS
  Filled 2012-02-18: qty 50

## 2012-02-18 MED ORDER — INSULIN ASPART 100 UNIT/ML IV SOLN
10.0000 [IU] | Freq: Once | INTRAVENOUS | Status: AC
  Administered 2012-02-18: 10 [IU] via INTRAVENOUS

## 2012-02-18 MED ORDER — METOPROLOL TARTRATE 1 MG/ML IV SOLN
INTRAVENOUS | Status: AC
  Filled 2012-02-18: qty 5

## 2012-02-18 MED ORDER — DEXTROSE 50 % IV SOLN
1.0000 | Freq: Once | INTRAVENOUS | Status: AC
  Administered 2012-02-18: 50 mL via INTRAVENOUS
  Filled 2012-02-18: qty 50

## 2012-02-18 MED ORDER — SODIUM CHLORIDE 0.9 % IV SOLN
INTRAVENOUS | Status: DC
  Administered 2012-02-18: 75 mL/h via INTRAVENOUS

## 2012-02-18 MED ORDER — METOPROLOL TARTRATE 12.5 MG HALF TABLET
12.5000 mg | ORAL_TABLET | Freq: Two times a day (BID) | ORAL | Status: DC
  Administered 2012-02-18 – 2012-02-23 (×11): 12.5 mg via ORAL
  Filled 2012-02-18 (×12): qty 1

## 2012-02-18 MED ORDER — SODIUM POLYSTYRENE SULFONATE 15 GM/60ML PO SUSP
30.0000 g | Freq: Once | ORAL | Status: AC
  Administered 2012-02-18: 30 g via ORAL
  Filled 2012-02-18: qty 120

## 2012-02-18 NOTE — Progress Notes (Signed)
ANTIBIOTIC CONSULT NOTE - FOLLOW UP  Pharmacy Consult for Vancomycin/Renal adjust antibiotics Indication: HCAP  No Known Allergies  Patient Measurements: Height: 5\' 10"  (177.8 cm) Weight: 185 lb 3 oz (84 kg) IBW/kg (Calculated) : 73   Vital Signs: Temp: 98.2 F (36.8 C) (01/15 0523) Temp src: Oral (01/15 0523) BP: 105/81 mmHg (01/15 0523) Pulse Rate: 77  (01/15 0523) Intake/Output from previous day: 01/14 0701 - 01/15 0700 In: 2019.5 [P.O.:120; I.V.:1047.5; IV Piggyback:852] Out: 1500 [Urine:1500] Intake/Output from this shift: Total I/O In: 378.8 [P.O.:360; I.V.:18.8] Out: -   Labs:  Basename 02/18/12 0520 02/18/12 0500 02/17/12 0540 02/16/12 1825  WBC 13.8* -- 22.4* 23.3*  HGB 7.2* -- 7.7* 6.4*  PLT 183 -- 284 286  LABCREA -- -- -- --  CREATININE -- 1.44* 1.16 0.85   Estimated Creatinine Clearance: 70.4 ml/min (by C-G formula based on Cr of 1.44).  Basename 02/18/12 1210  VANCOTROUGH 30.3*  VANCOPEAK --  Drue Dun --  GENTTROUGH --  GENTPEAK --  GENTRANDOM --  TOBRATROUGH --  TOBRAPEAK --  TOBRARND --  AMIKACINPEAK --  AMIKACINTROU --  AMIKACIN --     Microbiology: Recent Results (from the past 720 hour(s))  CULTURE, BLOOD (ROUTINE X 2)     Status: Normal (Preliminary result)   Collection Time   02/16/12  6:05 PM      Component Value Range Status Comment   Specimen Description BLOOD RIGHT HAND   Final    Special Requests NONE BOTTLES DRAWN AEROBIC AND ANAEROBIC 5CC   Final    Culture  Setup Time 02/16/2012 22:22   Final    Culture     Final    Value:        BLOOD CULTURE RECEIVED NO GROWTH TO DATE CULTURE WILL BE HELD FOR 5 DAYS BEFORE ISSUING A FINAL NEGATIVE REPORT   Report Status PENDING   Incomplete   CULTURE, BLOOD (ROUTINE X 2)     Status: Normal (Preliminary result)   Collection Time   02/16/12  6:12 PM      Component Value Range Status Comment   Specimen Description BLOOD RIGHT HAND   Final    Special Requests NONE BOTTLES DRAWN AEROBIC  AND ANAEROBIC 5CC   Final    Culture  Setup Time 02/16/2012 22:22   Final    Culture     Final    Value:        BLOOD CULTURE RECEIVED NO GROWTH TO DATE CULTURE WILL BE HELD FOR 5 DAYS BEFORE ISSUING A FINAL NEGATIVE REPORT   Report Status PENDING   Incomplete   URINE CULTURE     Status: Normal   Collection Time   02/16/12  6:44 PM      Component Value Range Status Comment   Specimen Description URINE, CLEAN CATCH   Final    Special Requests NONE   Final    Culture  Setup Time 02/16/2012 22:26   Final    Colony Count NO GROWTH   Final    Culture NO GROWTH   Final    Report Status 02/17/2012 FINAL   Final   MRSA PCR SCREENING     Status: Abnormal   Collection Time   02/16/12  9:10 PM      Component Value Range Status Comment   MRSA by PCR POSITIVE (*) NEGATIVE Final     Anti-infectives     Start     Dose/Rate Route Frequency Ordered Stop   02/16/12 2200  ceFEPIme (MAXIPIME) 1 g in dextrose 5 % 50 mL IVPB        1 g 100 mL/hr over 30 Minutes Intravenous 3 times per day 02/16/12 2041 02/24/12 2159   02/16/12 2200   levofloxacin (LEVAQUIN) IVPB 750 mg        750 mg 100 mL/hr over 90 Minutes Intravenous Every 24 hours 02/16/12 2041 02/19/12 2159   02/16/12 2000   vancomycin (VANCOCIN) IVPB 1000 mg/200 mL premix        1,000 mg 200 mL/hr over 60 Minutes Intravenous Every 8 hours 02/16/12 1822     02/16/12 1730  piperacillin-tazobactam (ZOSYN) IVPB 3.375 g       3.375 g 100 mL/hr over 30 Minutes Intravenous  Once 02/16/12 1714 02/16/12 1956          Assessment:  42 yo male with sickle cell disease admitted 1/13 with pain crisis  Day #3 vanc/cefepime/levaquin for presumed HCAP  SCr rising (0.85 > 1.16 > 1.44)  Vanc trough elevated (30.3) on 1gm IV q8h  Goal of Therapy:  Vancomycin trough level 15-20 mcg/ml  Plan:   Hold vancomycin, recheck random level and SCr in am  Continue cefepime 1g IV q8h  Last dose of levaquin is today Follow up renal function &  cultures, de-escalation of therapy  Loralee Pacas, PharmD, BCPS Pager: [phone] 02/18/2012,1:23 PM

## 2012-02-18 NOTE — Progress Notes (Signed)
SICKLE CELL SERVICE PROGRESS NOTE  [name] RUE:[id number] DOB: 15-Aug-1970 DOA: 02/16/2012 PCP: [name] Saucer ERIC, MD  Assessment/Plan: Active Problems: Acute Renal Failure with Hyperkalemia: Pt developed ARF with Cr going from 1.16 to 1.44 today this is likely secondary to volume contraction. I will increase IVF but will not give a bolus as patient had pulmonary edema whic caused hypoxemia. Pt also complained of frequency and urgency in the last month. His U/A is suggestive of UTI however U/C negative. If no improvement in renal function will pursue renal U/S.  Hyperkalemia: This is likely secondary to renal insufficiency as he has not received any exogenous Potassium. I will treat with Kayexalate, NaHCO3 and Dextrose with Insulin. Increase IVF and repeat Potassium levels after patient has had a BM.   Sickle cell crisis: Pt feels that his pain is under good control and agrees with  to transition to oral medications with IV for breakthrough. Will stop IVF   HCAP (healthcare-associated pneumonia) vs. Pulmonary Edema: Clinically patient is not ill appearing. His CXR shows an interstitial pattern. This could represent Pneumonia vs Pulmonary edema vs. Acute Chest syndrome.  In light of his clinical course. Pt had some improvement in appearance of CXR after Lasix yesterday. CXR from today pending. Continue antibiotics for now.   Acute respiratory distress: Improved. However patient still requiring 1 L/min of oxygen. Check sats on RA.   Leukocytosis: May be related to crisis, but may also be related to a pneumonia. Checking procalcitonin.   Acute chest syndrome(517.3): It is unclear that patient has an Acute Chest Syndrome as clinical course still unfolding. However it remains in the differential.   SVT (supraventricular tachycardia): Pt had an episode of SVT in the ER which was treated with Adenosine. Pt relates that he has intermittent tachycardia that is not related to pain. He denies any  lightheadedness or syncope with these episodes. His HR remained in the 80's, however patient had and episode of asymptomatic sinus tachycardia. I will start the patient on low dose Metoprolol.  I spoke with Dr. Eldridge Dace from [facility] and he agrees with Metoprolol and event monitor as an out patient. His office will call to arrange.    Anemia: Pt s/p transfusion of 1 unit PRBC. Now at basline. Indications for transfusion were SVT and Hypoxemia.   Code Status: Full Family Communication: N/A Disposition Plan: Home at time of discharge.  [name] A.  Pager [phone]. If 7PM-7AM, please contact night-coverage. 02/18/2012, 8:26 AM  LOS: 2 days   Brief narrative: [name] is a 42 y.o. male African American with a history of sickle cell anemia, diastolic CHF last 2-D echo 09/21/2007 with EF of 55-65%, chronic fatigue syndrome, gouty arthropathy, last hospitalization 12/07/2011 to 12/09/2011 4 sickle cell crisis who presents to the ED from his PCPs office with hypoxia with sats of 83% per ED report. Patient stated that went to see his PCP for followup appointment and he had started to develop a crisis with bilateral hip pain and knee pain 3 days prior to admission. Patient does endorse some shortness of breath on exertion when he and legs up or down stairs, subjective fevers, sneezing, bilateral hip and knee pain, constipation. Patient denies any chest pain, no nausea, no vomiting, no cough, no sore throat, no abdominal pain, no diarrhea, no dysuria, no weakness. Patient does endorse nocturia as well as congestion. Patient was seen in the ED with initial sats of 88% on room. Patient also noted to be in SVT with heart rate  going up as high as 181 was given 6 mg of adenosine and would return to a sinus tachycardia with heart rate in the 90s to low 100s. CBC obtained showed a white count of 23.5 hemoglobin of 6.5. Chest x-ray was worrisome for pneumonia versus edema. One unit of packed red  blood cells ordered to be transfused. Will call to admit the patient for further evaluation and management   Consultants:  None  Procedures:  None  Antibiotics:  Cefepime 1/13 >>  Levaquin 1/13 >>  Vancomycin 1/13 >>  HPI/Subjective: Pt states that he feels great and is doing well on his home medications. He states that had been prescribed metoprolol in the past, but decided to stop taking them. Objective: Filed Vitals:   02/17/12 1300 02/17/12 2125 02/18/12 0523 02/18/12 0643  BP: 130/85 120/74 105/81   Pulse: 70 77 77   Temp:  98.1 F (36.7 C) 98.2 F (36.8 C)   TempSrc:  Oral Oral   Resp: 20 18 20    Height:      Weight:      SpO2: 98% 97% 95% 95%   Weight change:   Intake/Output Summary (Last 24 hours) at 02/18/12 0826 Last data filed at 02/18/12 0645  Gross per 24 hour  Intake 1919.5 ml  Output   1500 ml  Net  419.5 ml    General: Alert, awake, oriented x3, in no acute distress. Well appearing HEENT: [place]/AT PEERL, EOMI, Very mild icterus Heart: Regular rate and rhythm, without murmurs, rubs, gallops, PMI non-displaced, no heaves or thrills on palpation.  Lungs: Clear to auscultation, no wheezing or rhonchi noted.  Abdomen: Soft, nontender, nondistended, positive bowel sounds, no masses no hepatosplenomegaly noted. Well healed scar across abdomen.  Neuro: No focal neurological deficits noted cranial nerves II through XII grossly intact.  Strength normal in bilateral upper and lower extremities. Musculoskeletal: No warm swelling or erythema around joints, no spinal tenderness noted.     Data Reviewed: Basic Metabolic Panel:  Lab 02/18/12 7829 02/17/12 0540 02/16/12 1825 02/16/12 1511 02/16/12 1253  NA 134* 138 -- 139 140  K 6.0* 4.6 -- 4.2 4.3  CL 105 107 -- 109 109  CO2 19 23 -- 23 23  GLUCOSE 99 87 -- 85 95  BUN 26* 16 -- 14 15  CREATININE 1.44* 1.16 0.85 0.89 0.90  CALCIUM 8.2* 8.5 -- 8.5 8.7  MG -- 2.5 1.8 -- --  PHOS -- -- -- -- --   Liver  Function Tests:  Lab 02/18/12 0500 02/17/12 0540 02/16/12 1253  AST 134* 132* 152*  ALT 48 50 51  ALKPHOS 104 108 116  BILITOT 5.6* 5.4* 9.2*  PROT 7.2 7.0 7.3  ALBUMIN 2.7* 2.5* 2.8*   No results found for this basename: LIPASE:5,AMYLASE:5 in the last 168 hours  Lab 02/16/12 2215  AMMONIA 83*   CBC:  Lab 02/18/12 0520 02/17/12 0540 02/16/12 1825 02/16/12 1253  WBC 13.8* 22.4* 23.3* 23.5*  NEUTROABS 5.9 10.8* -- 12.0*  HGB 7.2* 7.7* 6.4* 6.5*  HCT 20.4* 22.2* 18.5* 18.2*  MCV 98.1 98.2 100.5* 98.9  PLT 183 284 286 268   Cardiac Enzymes:  Lab 02/17/12 1717 02/17/12 0540 02/16/12 2215 02/16/12 1812  CKTOTAL 33 29 34 36  CKMB 1.3 1.3 1.2 1.2  CKMBINDEX -- -- -- --  TROPONINI -- <0.30 <0.30 <0.30   BNP (last 3 results)  Basename 02/16/12 1511 12/09/11 0755 08/14/11 0722  PROBNP 742.6* 102.5 180.9*   CBG: No results  found for this basename: GLUCAP:5 in the last 168 hours  Recent Results (from the past 240 hour(s))  CULTURE, BLOOD (ROUTINE X 2)     Status: Normal (Preliminary result)   Collection Time   02/16/12  6:05 PM      Component Value Range Status Comment   Specimen Description BLOOD RIGHT HAND   Final    Special Requests NONE BOTTLES DRAWN AEROBIC AND ANAEROBIC 5CC   Final    Culture  Setup Time 02/16/2012 22:22   Final    Culture     Final    Value:        BLOOD CULTURE RECEIVED NO GROWTH TO DATE CULTURE WILL BE HELD FOR 5 DAYS BEFORE ISSUING A FINAL NEGATIVE REPORT   Report Status PENDING   Incomplete   CULTURE, BLOOD (ROUTINE X 2)     Status: Normal (Preliminary result)   Collection Time   02/16/12  6:12 PM      Component Value Range Status Comment   Specimen Description BLOOD RIGHT HAND   Final    Special Requests NONE BOTTLES DRAWN AEROBIC AND ANAEROBIC 5CC   Final    Culture  Setup Time 02/16/2012 22:22   Final    Culture     Final    Value:        BLOOD CULTURE RECEIVED NO GROWTH TO DATE CULTURE WILL BE HELD FOR 5 DAYS BEFORE ISSUING A FINAL NEGATIVE  REPORT   Report Status PENDING   Incomplete   URINE CULTURE     Status: Normal   Collection Time   02/16/12  6:44 PM      Component Value Range Status Comment   Specimen Description URINE, CLEAN CATCH   Final    Special Requests NONE   Final    Culture  Setup Time 02/16/2012 22:26   Final    Colony Count NO GROWTH   Final    Culture NO GROWTH   Final    Report Status 02/17/2012 FINAL   Final   MRSA PCR SCREENING     Status: Abnormal   Collection Time   02/16/12  9:10 PM      Component Value Range Status Comment   MRSA by PCR POSITIVE (*) NEGATIVE Final      Studies: Dg Chest 2 View  02/16/2012  *RADIOLOGY REPORT*  Clinical Data: Sickle cell crisis with shortness of breath and body aches.  Low O2 sats.  CHEST - 2 VIEW  Comparison: 12/09/2011.  Findings: Trachea is midline.  Heart is mildly enlarged.  There is mild diffuse bilateral air space disease.  No pleural fluid.  IMPRESSION: Mild diffuse bilateral air space disease which may be due to edema or pneumonia.   Original Report Authenticated By: Leanna Battles, M.D.     Scheduled Meds:    . ceFEPime (MAXIPIME) IV  1 g Intravenous Q8H  . Chlorhexidine Gluconate Cloth  6 each Topical Q0600  . dextrose  1 ampule Intravenous Once  . enoxaparin (LOVENOX) injection  40 mg Subcutaneous Q24H  . folic acid  1 mg Oral Daily  . HYDROcodone-acetaminophen  1 tablet Oral Q4H  . insulin aspart  10 Units Intravenous Once  . ketorolac  30 mg Intravenous Q6H  . levofloxacin (LEVAQUIN) IV  750 mg Intravenous Q24H  . methadone  10 mg Oral TID  . metoprolol      . mupirocin ointment  1 application Nasal BID  . pantoprazole  40 mg Oral Q0600  .  senna-docusate  1 tablet Oral BID  . sodium bicarbonate  50 mEq Intravenous Once  . sodium polystyrene  30 g Oral Once  . vancomycin  1,000 mg Intravenous Q8H   Continuous Infusions:    . sodium chloride      Active Problems:  Gout  Chronic pain  Leukocytosis  Acute chest syndrome(517.3)   Hyperkalemia  Sickle cell crisis  Acute respiratory distress  HCAP (healthcare-associated pneumonia)  Hypoxia  SVT (supraventricular tachycardia)  Anemia  Jaundice  Acute renal failure

## 2012-02-18 NOTE — Progress Notes (Signed)
Patient had 5 beats of PVCs and then heart rate went into 150s-low 160s. RN went into patient's room and he stated he felt like his heart was racing. NP, Donnamarie Poag, on call was notified. She ordered 5 mg metoprolol IV. RN will continue to monitor patient's HR. Patient states his heart rate races like this at home on occasion.

## 2012-02-19 ENCOUNTER — Inpatient Hospital Stay (HOSPITAL_COMMUNITY): Payer: BC Managed Care – PPO

## 2012-02-19 LAB — COMPREHENSIVE METABOLIC PANEL
ALT: 42 U/L (ref 0–53)
CO2: 20 mEq/L (ref 19–32)
Calcium: 8.2 mg/dL — ABNORMAL LOW (ref 8.4–10.5)
Creatinine, Ser: 1.57 mg/dL — ABNORMAL HIGH (ref 0.50–1.35)
GFR calc Af Amer: 62 mL/min — ABNORMAL LOW (ref >90)
GFR calc non Af Amer: 54 mL/min — ABNORMAL LOW (ref >90)
Glucose, Bld: 85 mg/dL (ref 70–99)
Sodium: 137 mEq/L (ref 135–145)
Total Bilirubin: 5.1 mg/dL — ABNORMAL HIGH (ref 0.3–1.2)

## 2012-02-19 LAB — VANCOMYCIN, RANDOM: Vancomycin Rm: 10.2 ug/mL

## 2012-02-19 MED ORDER — VANCOMYCIN HCL IN DEXTROSE 1-5 GM/200ML-% IV SOLN
1000.0000 mg | INTRAVENOUS | Status: DC
  Filled 2012-02-19: qty 200

## 2012-02-19 MED ORDER — VANCOMYCIN HCL IN DEXTROSE 1-5 GM/200ML-% IV SOLN
1000.0000 mg | Freq: Three times a day (TID) | INTRAVENOUS | Status: DC
  Filled 2012-02-19 (×2): qty 200

## 2012-02-19 MED ORDER — LEVOFLOXACIN IN D5W 750 MG/150ML IV SOLN
750.0000 mg | INTRAVENOUS | Status: DC
Start: 2012-02-19 — End: 2012-02-19
  Filled 2012-02-19: qty 150

## 2012-02-19 MED ORDER — LEVOFLOXACIN IN D5W 750 MG/150ML IV SOLN
750.0000 mg | INTRAVENOUS | Status: DC
  Administered 2012-02-19 – 2012-02-25 (×6): 750 mg via INTRAVENOUS
  Filled 2012-02-19 (×6): qty 150

## 2012-02-19 MED ORDER — FUROSEMIDE 10 MG/ML IJ SOLN
20.0000 mg | Freq: Once | INTRAMUSCULAR | Status: AC
Start: 2012-02-19 — End: 2012-02-19
  Administered 2012-02-19: 20 mg via INTRAVENOUS
  Filled 2012-02-19: qty 2

## 2012-02-19 NOTE — Progress Notes (Signed)
SICKLE CELL SERVICE PROGRESS NOTE  [name] ZOX:[id number] DOB: 01/16/1971 DOA: 02/16/2012 PCP: August Rojas ERIC, MD  Assessment/Plan: Active Problems: Acute Kidney Injury: Suspect ATN associated with NSAID's and possibly vancomycin. Pt endorses that prior to hospitalization, he was taking NSAID's frequently (even though cautioned against it) for knee pain. Will continue IVF. NSAID's discontinued. Awaiting consult from Nephrology  Hyperkalemia: Improved after acute intervention.   Sickle cell crisis: Pt feels that his pain is under good control on oral medications.   HCAP (healthcare-associated pneumonia) vs. Pulmonary Edema: Clinically patient is not ill appearing. His CXR shows an interstitial pattern. This could represent Pneumonia vs Pulmonary edema vs. Acute Chest syndrome.  In light of his clinical course. Pt had some improvement in appearance of CXR after Lasix yesterday.  CXR improved yesterday and pt has no clinical signs of pneumonia but procalcitonin increased. Will narrow antibiotic spectrum and continue renally adjusted Levaquin   Acute respiratory distress: Improved. However patient still requiring 1 L/min of oxygen. Check sats on RA.   Leukocytosis: May be related to crisis, but may also be related to Rojas pneumonia. Checking procalcitonin.   Acute chest syndrome(517.3): It is unclear that patient has an Acute Chest Syndrome as clinical course still unfolding. However it remains in the differential.   SVT (supraventricular tachycardia): Pt had an episode of SVT in the ER which was treated with Adenosine. Pt relates that he has intermittent tachycardia that is not related to pain. He denies any lightheadedness or syncope with these episodes. His HR remained in the 80's, however patient had and episode of asymptomatic sinus tachycardia. I will start the patient on low dose Metoprolol.  I spoke with Dr. Eldridge Dace from [facility] and he agrees with Metoprolol and event monitor as an  out patient. His office will call to arrange. Tolerating Metoprolol well.   Anemia: Pt s/p transfusion of 1 unit PRBC. Now at basline. Indications for transfusion were SVT and Hypoxemia.   Code Status: Full Family Communication: N/Rojas Disposition Plan: Home at time of discharge.  [name].  Pager [phone]. If 7PM-7AM, please contact night-coverage. 02/19/2012, 7:07 AM  LOS: 3 days   Brief narrative: [name] is Rojas 42 y.o. male African American with Rojas history of sickle cell anemia, diastolic CHF last 2-D echo 09/21/2007 with EF of 55-65%, chronic fatigue syndrome, gouty arthropathy, last hospitalization 12/07/2011 to 12/09/2011 4 sickle cell crisis who presents to the ED from his PCPs office with hypoxia with sats of 83% per ED report. Patient stated that went to see his PCP for followup appointment and he had started to develop Rojas crisis with bilateral hip pain and knee pain 3 days prior to admission. Patient does endorse some shortness of breath on exertion when he and legs up or down stairs, subjective fevers, sneezing, bilateral hip and knee pain, constipation. Patient denies any chest pain, no nausea, no vomiting, no cough, no sore throat, no abdominal pain, no diarrhea, no dysuria, no weakness. Patient does endorse nocturia as well as congestion. Patient was seen in the ED with initial sats of 88% on room. Patient also noted to be in SVT with heart rate going up as high as 181 was given 6 mg of adenosine and would return to Rojas sinus tachycardia with heart rate in the 90s to low 100s. CBC obtained showed Rojas white count of 23.5 hemoglobin of 6.5. Chest x-ray was worrisome for pneumonia versus edema. One unit of packed red blood cells ordered to be transfused. Will call  to admit the patient for further evaluation and management   Consultants:  None  Procedures:  None  Antibiotics:  Cefepime 1/13 >>1/16  Levaquin 1/13 >>  Vancomycin 1/13 >>1/16  HPI/Subjective: Pt states  that he feels great and is doing well on his home medications.  Objective: Filed Vitals:   02/18/12 1800 02/18/12 1813 02/18/12 2330 02/19/12 0705  BP:  119/62 114/54 116/69  Pulse:  73 78 57  Temp:  98.6 F (37 C) 97.8 F (36.6 C) 97.1 F (36.2 C)  TempSrc:  Oral Oral Oral  Resp:  20 20 16   Height:      Weight:    85.7 kg (188 lb 15 oz)  SpO2: 86% 94% 94% 94%   Weight change:   Intake/Output Summary (Last 24 hours) at 02/19/12 0707 Last data filed at 02/19/12 0700  Gross per 24 hour  Intake 2026.33 ml  Output     22 ml  Net 2004.33 ml    General: Alert, awake, oriented x3, in no acute distress. Well appearing HEENT: [place]/AT PEERL, EOMI, Very mild icterus Heart: Regular rate and rhythm, without murmurs, rubs, gallops, PMI non-displaced, no heaves or thrills on palpation.  Lungs: Clear to auscultation, no wheezing or rhonchi noted. No rales noted Abdomen: Soft, nontender, nondistended, positive bowel sounds, no masses no hepatosplenomegaly noted. Well healed scar across abdomen.  Neuro: No focal neurological deficits noted cranial nerves II through XII grossly intact.  Strength normal in bilateral upper and lower extremities. Musculoskeletal: No warm swelling or erythema around joints, no spinal tenderness noted.     Data Reviewed: Basic Metabolic Panel:  Lab 02/19/12 3086 02/18/12 1636 02/18/12 0830 02/18/12 0500 02/17/12 0540 02/16/12 1825 02/16/12 1511  NA 137 138 -- 134* 138 -- 139  K 4.9 4.8 -- 6.0* 4.6 -- 4.2  CL 107 107 -- 105 107 -- 109  CO2 20 21 -- 19 23 -- 23  GLUCOSE 85 86 -- 99 87 -- 85  BUN 32* 30* -- 26* 16 -- 14  CREATININE 1.57* 1.56* -- 1.44* 1.16 0.85 --  CALCIUM 8.2* 8.2* -- 8.2* 8.5 -- 8.5  MG -- -- 2.5 -- 2.5 1.8 --  PHOS -- -- -- -- -- -- --   Liver Function Tests:  Lab 02/19/12 0456 02/18/12 0500 02/17/12 0540 02/16/12 1253  AST 108* 134* 132* 152*  ALT 42 48 50 51  ALKPHOS 102 104 108 116  BILITOT 5.1* 5.6* 5.4* 9.2*  PROT 7.1 7.2 7.0  7.3  ALBUMIN 2.6* 2.7* 2.5* 2.8*   No results found for this basename: LIPASE:5,AMYLASE:5 in the last 168 hours  Lab 02/16/12 2215  AMMONIA 83*   CBC:  Lab 02/18/12 0520 02/17/12 0540 02/16/12 1825 02/16/12 1253  WBC 13.8* 22.4* 23.3* 23.5*  NEUTROABS 5.9 10.8* -- 12.0*  HGB 7.2* 7.7* 6.4* 6.5*  HCT 20.4* 22.2* 18.5* 18.2*  MCV 98.1 98.2 100.5* 98.9  PLT 183 284 286 268   Cardiac Enzymes:  Lab 02/18/12 1636 02/18/12 1210 02/17/12 1717 02/17/12 0540 02/16/12 2215 02/16/12 1812  CKTOTAL 51 43 33 29 34 --  CKMB 1.5 1.6 1.3 1.3 1.2 --  CKMBINDEX -- -- -- -- -- --  TROPONINI -- -- -- <0.30 <0.30 <0.30   BNP (last 3 results)  Basename 02/16/12 1511 12/09/11 0755 08/14/11 0722  PROBNP 742.6* 102.5 180.9*   CBG: No results found for this basename: GLUCAP:5 in the last 168 hours  Recent Results (from the past 240 hour(s))  CULTURE,  BLOOD (ROUTINE X 2)     Status: Normal (Preliminary result)   Collection Time   02/16/12  6:05 PM      Component Value Range Status Comment   Specimen Description BLOOD RIGHT HAND   Final    Special Requests NONE BOTTLES DRAWN AEROBIC AND ANAEROBIC 5CC   Final    Culture  Setup Time 02/16/2012 22:22   Final    Culture     Final    Value:        BLOOD CULTURE RECEIVED NO GROWTH TO DATE CULTURE WILL BE HELD FOR 5 DAYS BEFORE ISSUING Rojas FINAL NEGATIVE REPORT   Report Status PENDING   Incomplete   CULTURE, BLOOD (ROUTINE X 2)     Status: Normal (Preliminary result)   Collection Time   02/16/12  6:12 PM      Component Value Range Status Comment   Specimen Description BLOOD RIGHT HAND   Final    Special Requests NONE BOTTLES DRAWN AEROBIC AND ANAEROBIC 5CC   Final    Culture  Setup Time 02/16/2012 22:22   Final    Culture     Final    Value:        BLOOD CULTURE RECEIVED NO GROWTH TO DATE CULTURE WILL BE HELD FOR 5 DAYS BEFORE ISSUING Rojas FINAL NEGATIVE REPORT   Report Status PENDING   Incomplete   URINE CULTURE     Status: Normal   Collection Time    02/16/12  6:44 PM      Component Value Range Status Comment   Specimen Description URINE, CLEAN CATCH   Final    Special Requests NONE   Final    Culture  Setup Time 02/16/2012 22:26   Final    Colony Count NO GROWTH   Final    Culture NO GROWTH   Final    Report Status 02/17/2012 FINAL   Final   MRSA PCR SCREENING     Status: Abnormal   Collection Time   02/16/12  9:10 PM      Component Value Range Status Comment   MRSA by PCR POSITIVE (*) NEGATIVE Final      Studies: Dg Chest 2 View  02/16/2012  *RADIOLOGY REPORT*  Clinical Data: Sickle cell crisis with shortness of breath and body aches.  Low O2 sats.  CHEST - 2 VIEW  Comparison: 12/09/2011.  Findings: Trachea is midline.  Heart is mildly enlarged.  There is mild diffuse bilateral air space disease.  No pleural fluid.  IMPRESSION: Mild diffuse bilateral air space disease which may be due to edema or pneumonia.   Original Report Authenticated By: Leanna Battles, M.D.     Scheduled Meds:    . ceFEPime (MAXIPIME) IV  1 g Intravenous Q8H  . Chlorhexidine Gluconate Cloth  6 each Topical Q0600  . enoxaparin (LOVENOX) injection  40 mg Subcutaneous Q24H  . folic acid  1 mg Oral Daily  . HYDROcodone-acetaminophen  1 tablet Oral Q4H  . methadone  10 mg Oral TID  . metoprolol tartrate  12.5 mg Oral BID  . mupirocin ointment  1 application Nasal BID  . pantoprazole  40 mg Oral Q0600  . senna-docusate  1 tablet Oral BID   Continuous Infusions:    . sodium chloride 100 mL/hr (02/18/12 1815)    Active Problems:  Gout  Chronic pain  Leukocytosis  Acute chest syndrome(517.3)  Hyperkalemia  Sickle cell crisis  Acute respiratory distress  HCAP (healthcare-associated pneumonia)  Hypoxia  SVT (supraventricular tachycardia)  Anemia  Jaundice  Acute renal failure

## 2012-02-19 NOTE — Consult Note (Addendum)
[name] 02/19/2012 Jaspreet Hollings D Requesting Physician:  Dr Ashley Royalty  Reason for Consult:  Acute renal insufficiency HPI: The patient is a 42 y.o. year-old with hx of sickle cell disease and gout, diastolic CHF who presented on 0/98 to PCP's office with 3 day hx of hip and knee pain typical of sickle crisis; in PCP's office O2 sat was 88% and pt sent to ED. In ED 02 sat was 88% on room air, tele showed SVT with HR 180's. He was treated with adenosine and converted to sinus tach at 100's.  WBC was 23k, Hb 6.5, CXR showed mild IS pulm edema vs PNA. He was admitted and treated for PNA with IV vanc, levaquin and maxipime. He rec'd q 6 hr IV Toradol for sickle pain. He rec'd two doses of 20 mg IV lasix, one each on 1/14 and 1/15.  UOP on 1/14 was 1500 cc, on 1/15 7 voids were recorded, and today 3. Creatinine increased from 0.9 on admission and increased to 1.4 on 1/14, 1.56 on 1/15 and 1.57 today.  BP's mostly normal, lowest BP 107/67.  NO iv contrast given.   ROS  takes OTC advil for gout or sickle pains regularly  no hx renal failure or kidney stones  no sob or cp, no hemoptysis  no n/v/d or abd pain  no skin rash or itching  no confusion or hallucination   Past Medical History:  Past Medical History  Diagnosis Date  . CHF (congestive heart failure)   . Gouty arthropathy   . Chronic fatigue syndrome   . Sickle cell anemia     Past Surgical History:  Past Surgical History  Procedure Date  . Appendectomy   . Cholecystectomy   . Splenectomy     Family History:  Family History  Problem Relation Age of Onset  . Sickle cell anemia    . Sickle cell trait Mother    Social History:  reports that he has never smoked. He does not have any smokeless tobacco history on file. He reports that he does not drink alcohol or use illicit drugs.  Allergies: No Known Allergies  Home medications: Prior to Admission medications   Medication Sig Start Date End Date Taking? Authorizing  Provider  ibuprofen (ADVIL,MOTRIN) 200 MG tablet Take 400-800 mg by mouth every 6 (six) hours as needed. For pain   Yes Historical Provider, MD  methadone (DOLOPHINE) 10 MG tablet Take 1 tablet (10 mg total) by mouth 3 (three) times daily. 08/14/11  Yes Grayce Sessions, NP  sodium chloride (OCEAN) 0.65 % nasal spray Place 1 spray into the nose as needed. For nasal congestion   Yes Historical Provider, MD  zolpidem (AMBIEN) 10 MG tablet Take 10 mg by mouth at bedtime as needed. For sleep.   Yes Historical Provider, MD    Inpatient medications:    . Chlorhexidine Gluconate Cloth  6 each Topical Q0600  . enoxaparin (LOVENOX) injection  40 mg Subcutaneous Q24H  . folic acid  1 mg Oral Daily  . HYDROcodone-acetaminophen  1 tablet Oral Q4H  . methadone  10 mg Oral TID  . metoprolol tartrate  12.5 mg Oral BID  . mupirocin ointment  1 application Nasal BID  . pantoprazole  40 mg Oral Q0600    Labs: Basic Metabolic Panel:  Lab 02/19/12 1191 02/18/12 1636 02/18/12 0500 02/17/12 0540 02/16/12 1825 02/16/12 1511 02/16/12 1253  NA 137 138 134* 138 -- 139 140  K 4.9 4.8 6.0* 4.6 -- 4.2  4.3  CL 107 107 105 107 -- 109 109  CO2 20 21 19 23  -- 23 23  GLUCOSE 85 86 99 87 -- 85 95  BUN 32* 30* 26* 16 -- 14 15  CREATININE 1.57* 1.56* 1.44* 1.16 0.85 0.89 0.90  ALB -- -- -- -- -- -- --  CALCIUM 8.2* 8.2* 8.2* 8.5 -- 8.5 8.7  PHOS -- -- -- -- -- -- --   Liver Function Tests:  Lab 02/19/12 0456 02/18/12 0500 02/17/12 0540  AST 108* 134* 132*  ALT 42 48 50  ALKPHOS 102 104 108  BILITOT 5.1* 5.6* 5.4*  PROT 7.1 7.2 7.0  ALBUMIN 2.6* 2.7* 2.5*   No results found for this basename: LIPASE:3,AMYLASE:3 in the last 168 hours  Lab 02/16/12 2215  AMMONIA 83*   CBC:  Lab 02/18/12 0520 02/17/12 0540 02/16/12 1825 02/16/12 1253  WBC 13.8* 22.4* 23.3* 23.5*  NEUTROABS 5.9 10.8* -- 12.0*  HGB 7.2* 7.7* 6.4* 6.5*  HCT 20.4* 22.2* 18.5* 18.2*  MCV 98.1 98.2 100.5* 98.9  PLT 183 284 286 268    PT/INR: @labrcntip (inr:5) Cardiac Enzymes:  Lab 02/18/12 1636 02/18/12 1210 02/17/12 1717 02/17/12 0540 02/16/12 2215 02/16/12 1812  CKTOTAL 51 43 33 29 34 --  CKMB 1.5 1.6 1.3 1.3 1.2 --  CKMBINDEX -- -- -- -- -- --  TROPONINI -- -- -- <0.30 <0.30 <0.30   CBG: No results found for this basename: GLUCAP:5 in the last 168 hours  Iron Studies: No results found for this basename: IRON:30,TIBC:30,TRANSFERRIN:30,FERRITIN:30 in the last 168 hours  Xrays/Other Studies: Dg Chest 2 View  02/18/2012  *RADIOLOGY REPORT*  Clinical Data: Hypoxemia.  Sickle cell crisis.  CHEST - 2 VIEW  Comparison: 01/14 02/16/2012 and 12/09/2011  Findings: There is chronic cardiomegaly with chronic pulmonary vascular congestion.  However, the interstitial edema has resolved. There is chronic blunting of the right costophrenic angle.  IMPRESSION: Resolution of interstitial edema.  Persistent pulmonary vascular congestion and cardiomegaly.   Original Report Authenticated By: Francene Boyers, M.D.    Dg Chest 2 View  02/17/2012  *RADIOLOGY REPORT*  Clinical Data: 42 year old male sickle cell crisis.  Shortness of breath, cough.  CHEST - 2 VIEW  Comparison: 02/16/2012 and earlier.  Findings: Stable cardiomegaly and mediastinal contours.  Stable lung volumes.  Pulmonary vascular congestion/widespread increased interstitial markings are stable and appear to be at baseline.  No pneumothorax or pleural effusion.  The right costophrenic angle scarring suspected.  Mild increased nodular opacity here compared to prior studies. Visualized tracheal air column is within normal limits.  No acute osseous abnormality identified.  IMPRESSION: Mild patchy opacity at the right costophrenic angle might reflect resolving inflammation/acute chest syndrome.  Otherwise, stable chronic findings in the chest.   Original Report Authenticated By: Erskine Speed, M.D.     Physical Exam:  Blood pressure 105/53, pulse 78, temperature 98 F (36.7 C),  temperature source Oral, resp. rate 16, height 5\' 10"  (1.778 m), weight 85.7 kg (188 lb 15 oz), SpO2 97.00%.  Gen: alert, AAM in no distress Skin: no rash, cyanosis HEENT:  EOMI, sclera anicteric, throat clear Neck: JVP about 12, no LAN Chest: bibasilar crackles 1/3 up posteriorly CV: regular, no rub or gallop, no murmur, no carotid bruits, pedal pulses intact Abdomen: soft, +hepatomegaly, no ascites, nontender, +BS Ext: no LE or UE edema, no gangrene or ulceration Neuro: alert, Ox3, no focal deficit  UA- >300 prot, pos nit and neg LE, large Hgb Urine sediment (today,  undersigned) > trace protein, SG 1.030, Lg blood, 1+prot, +gran casts, no rbc or wbc  Impression/Plan 1. Acute kidney injury in SCD patient with painful crisis receiving IV vanc (5 doses, no levels), regular IV NSAID's while getting diuresed for suspected diast HF/pulm edema.  Suspect ATN. Sediment showed gran casts, no rbc/wbc's and large bld, ?hemoglobinuria or myoglobin.  Will order CPK and plasma free Hgb. High protein on lab UA dipstick not present when I did the UA, only trace. Will check urine for prot./creat ratio.  Recommend supportive care, agree with holding vanc and NSAID"S. On exam he has rales so will stop fluids for now, give 1 dose IV lasix and f/u creatinine in am. Suspect he will improve. 2. Pulm edema- mild but concerning, could he have cardiac disease such as hemochromatosis? 3. Sickle cell disease with painful crisis- per primary 4. Hx gout- takes NSAID"S at home   Vinson Moselle  MD [facility] [phone] pgr    [phone] cell 02/19/2012, 3:02 PM

## 2012-02-19 NOTE — Progress Notes (Signed)
ANTIBIOTIC CONSULT NOTE - FOLLOW UP  Pharmacy Consult for Vancomycin/Renal adjust antibiotics Indication: HCAP  No Known Allergies  Patient Measurements: Height: 5\' 10"  (177.8 cm) Weight: 188 lb 15 oz (85.7 kg) IBW/kg (Calculated) : 73   Vital Signs: Temp: 97.1 F (36.2 C) (01/16 0705) Temp src: Oral (01/16 0705) BP: 116/69 mmHg (01/16 0705) Pulse Rate: 57  (01/16 0705) Intake/Output from previous day: 01/15 0701 - 01/16 0700 In: 2826.3 [P.O.:1080; I.V.:1696.3; IV Piggyback:50] Out: 22 [Urine:7; Stool:15] Intake/Output from this shift:    Labs:  Basename 02/19/12 0456 02/18/12 1636 02/18/12 0520 02/18/12 0500 02/17/12 0540 02/16/12 1825  WBC -- -- 13.8* -- 22.4* 23.3*  HGB -- -- 7.2* -- 7.7* 6.4*  PLT -- -- 183 -- 284 286  LABCREA -- -- -- -- -- --  CREATININE 1.57* 1.56* -- 1.44* -- --   Estimated Creatinine Clearance: 64.6 ml/min (by C-G formula based on Cr of 1.57).  Basename 02/19/12 0456 02/18/12 1210  VANCOTROUGH -- 30.3*  VANCOPEAK -- --  Drue Dun 15.1 --  GENTTROUGH -- --  GENTPEAK -- --  GENTRANDOM -- --  TOBRATROUGH -- --  TOBRAPEAK -- --  TOBRARND -- --  AMIKACINPEAK -- --  AMIKACINTROU -- --  AMIKACIN -- --     Microbiology: Recent Results (from the past 720 hour(s))  CULTURE, BLOOD (ROUTINE X 2)     Status: Normal (Preliminary result)   Collection Time   02/16/12  6:05 PM      Component Value Range Status Comment   Specimen Description BLOOD RIGHT HAND   Final    Special Requests NONE BOTTLES DRAWN AEROBIC AND ANAEROBIC 5CC   Final    Culture  Setup Time 02/16/2012 22:22   Final    Culture     Final    Value:        BLOOD CULTURE RECEIVED NO GROWTH TO DATE CULTURE WILL BE HELD FOR 5 DAYS BEFORE ISSUING A FINAL NEGATIVE REPORT   Report Status PENDING   Incomplete   CULTURE, BLOOD (ROUTINE X 2)     Status: Normal (Preliminary result)   Collection Time   02/16/12  6:12 PM      Component Value Range Status Comment   Specimen Description  BLOOD RIGHT HAND   Final    Special Requests NONE BOTTLES DRAWN AEROBIC AND ANAEROBIC 5CC   Final    Culture  Setup Time 02/16/2012 22:22   Final    Culture     Final    Value:        BLOOD CULTURE RECEIVED NO GROWTH TO DATE CULTURE WILL BE HELD FOR 5 DAYS BEFORE ISSUING A FINAL NEGATIVE REPORT   Report Status PENDING   Incomplete   URINE CULTURE     Status: Normal   Collection Time   02/16/12  6:44 PM      Component Value Range Status Comment   Specimen Description URINE, CLEAN CATCH   Final    Special Requests NONE   Final    Culture  Setup Time 02/16/2012 22:26   Final    Colony Count NO GROWTH   Final    Culture NO GROWTH   Final    Report Status 02/17/2012 FINAL   Final   MRSA PCR SCREENING     Status: Abnormal   Collection Time   02/16/12  9:10 PM      Component Value Range Status Comment   MRSA by PCR POSITIVE (*) NEGATIVE Final  Anti-infectives     Start     Dose/Rate Route Frequency Ordered Stop   02/19/12 0800   vancomycin (VANCOCIN) IVPB 1000 mg/200 mL premix  Status:  Discontinued        1,000 mg 200 mL/hr over 60 Minutes Intravenous Every 8 hours 02/19/12 0720 02/19/12 0721   02/19/12 0800   vancomycin (VANCOCIN) IVPB 1000 mg/200 mL premix        1,000 mg 200 mL/hr over 60 Minutes Intravenous Every 24 hours 02/19/12 0721     02/16/12 2200   ceFEPIme (MAXIPIME) 1 g in dextrose 5 % 50 mL IVPB        1 g 100 mL/hr over 30 Minutes Intravenous 3 times per day 02/16/12 2041 02/24/12 2159   02/16/12 2200   levofloxacin (LEVAQUIN) IVPB 750 mg        750 mg 100 mL/hr over 90 Minutes Intravenous Every 24 hours 02/16/12 2041 02/19/12 0025   02/16/12 2000   vancomycin (VANCOCIN) IVPB 1000 mg/200 mL premix  Status:  Discontinued        1,000 mg 200 mL/hr over 60 Minutes Intravenous Every 8 hours 02/16/12 1822 02/18/12 1327   02/16/12 1730   piperacillin-tazobactam (ZOSYN) IVPB 3.375 g        3.375 g 100 mL/hr over 30 Minutes Intravenous  Once 02/16/12 1714 02/16/12  1956          Assessment:  42 yo male with sickle cell disease admitted 1/13 with pain crisis  Day #4 Vancomycin/Cefepime for  presumed HCAP.  Levaquin discontinued after 3 doses  SCr rose over the last two days and appears to be high/stable at 1.57 currently  Vanc trough elevated (30.3) on 1gm IV q8h -- Vancomycin trough was repeated 24 hours after last dose, level was therapeutic indicating patient is clearing vancomycin but may need Q24h dosing.    Blood cultures NGTD, pending; Urine culture NG Final.   Goal of Therapy:  Vancomycin trough level 15-20 mcg/ml  Plan:   Resume Vancomycin at 1 gram IV q24h, Repeat BMET and random vancomycin level in the AM  Continue cefepime 1g IV q8h Follow up renal function & cultures, de-escalation of therapy  Gennie Dib, Loma Messing PharmD Pager #: [phone] 7:28 AM 02/19/2012

## 2012-02-19 NOTE — Progress Notes (Addendum)
Brief Pharmacy Note: Levaquin  S/O: See previous pharmacy from today. Pt completed 3d of Levaquin - MD would like to continue (HCAP). Narrowing Abx to levaquin alone   A/P Scr 1.6, CrCl 65 ml/min Will resume Levaquin 750mg  IV q24h Follow labs, adjust as necessary  Gwen Her PharmD  [phone] 02/19/2012 4:54 PM

## 2012-02-20 ENCOUNTER — Inpatient Hospital Stay (HOSPITAL_COMMUNITY): Payer: BC Managed Care – PPO

## 2012-02-20 LAB — SODIUM, URINE, RANDOM: Sodium, Ur: 99 mEq/L

## 2012-02-20 LAB — BASIC METABOLIC PANEL
CO2: 20 mEq/L (ref 19–32)
Chloride: 112 mEq/L (ref 96–112)
Creatinine, Ser: 1.58 mg/dL — ABNORMAL HIGH (ref 0.50–1.35)
GFR calc Af Amer: 62 mL/min — ABNORMAL LOW (ref >90)
Sodium: 140 mEq/L (ref 135–145)

## 2012-02-20 LAB — PROTEIN / CREATININE RATIO, URINE: Total Protein, Urine: 29.3 mg/dL

## 2012-02-20 NOTE — Care Management Note (Signed)
    Page 1 of 1   02/20/2012     10:58:45 AM   CARE MANAGEMENT NOTE 02/20/2012  Patient:  [name], [name]   Account Number:  [id number]  Date Initiated:  02/17/2012  Documentation initiated by:  DAVIS,RHONDA  Subjective/Objective Assessment:   pt with hx of ssc now with poss pna, hgb 6.5 on admission, wbc 22.5,     Action/Plan:   home when stable   Anticipated DC Date:  02/20/2012   Anticipated DC Plan:  HOME/SELF CARE         Choice offered to / List presented to:             Status of service:  Completed, signed off Medicare Important Message given?  NO (If response is "NO", the following Medicare IM given date fields will be blank) Date Medicare IM given:   Date Additional Medicare IM given:    Discharge Disposition:  HOME/SELF CARE  Per UR Regulation:  Reviewed for med. necessity/level of care/duration of stay  If discussed at Long Length of Stay Meetings, dates discussed:    Comments:  [id number]/VHQION Earlene Plater, RN, BSN, CCM: CHART REVIEWED AND UPDATED.  Next chart review due on [id number]. NO DISCHARGE NEEDS PRESENT AT THIS TIME. CASE MANAGEMENT [phone]

## 2012-02-20 NOTE — Progress Notes (Signed)
Spoke with Dr. Arlean Hopping (Nephrologist) and he stated that he was okay with pt having a PICC line placed and that pt had no restrictions from his standpoint.   Arta Bruce [facility] 02/20/2012 1:42 PM   Spoke with IR and they stated they could place PICC line today.

## 2012-02-20 NOTE — Progress Notes (Addendum)
SICKLE CELL SERVICE PROGRESS NOTE  [name] ZOX:[id number] DOB: Aug 23, 1970 DOA: 02/16/2012 PCP: August Saucer ERIC, MD  Assessment/Plan: Active Problems: Acute Kidney Injury: Suspect ATN associated with NSAID's and possibly vancomycin. Pt endorses that prior to hospitalization, he was taking NSAID's frequently (even though cautioned against it) for knee pain.  NSAID's discontinued.  IVF discontinued per Nephrology secondary to rales. Will defer to their recommendations on fluids. No improvement in renal function. Will continue to monitor. Renal ultrasound reviewed.  Hyperkalemia: Improved after acute intervention. Will ned to watch closely as is trending up.   Sickle cell crisis: Pt feels that his pain is under good control on oral medications.   HCAP (healthcare-associated pneumonia) vs. Pulmonary Edema: Clinically patient is not ill appearing. His CXR shows an interstitial pattern. This could represent Pneumonia vs Pulmonary edema vs. Acute Chest syndrome.  In light of his clinical course. Pt had some improvement in appearance of CXR after Lasix yesterday.  CXR improved yesterday and pt has no clinical signs of pneumonia but procalcitonin increased. Will narrow antibiotic spectrum and continue renally adjusted Levaquin.  Pt on day #5/8 of Levaquin.  Acute respiratory distress: Improved. However patient still requiring 1 L/min of oxygen. Check sats on RA.   Leukocytosis: May be related to crisis, but may also be related to a pneumonia. Checking procalcitonin.   Acute chest syndrome(517.3): It is unclear that patient has an Acute Chest Syndrome as clinical course still unfolding. However it remains in the differential.   SVT (supraventricular tachycardia): Pt had an episode of SVT in the ER which was treated with Adenosine. Pt relates that he has intermittent tachycardia that is not related to pain. He denies any lightheadedness or syncope with these episodes. His HR remained in the 80's, however  patient had and episode of asymptomatic sinus tachycardia. I will start the patient on low dose Metoprolol.  I spoke with Dr. Eldridge Dace from [facility] and he agrees with Metoprolol and event monitor as an out patient. His office will call to arrange. Tolerating Metoprolol well. Will discontinue cardiac monitoring. ECHO findings show questionable rupture or flail cord. Will discuss with cardiology.     Anemia: Pt s/p transfusion of 1 unit PRBC. Now at basline. Indications for transfusion were SVT and Hypoxemia.   Code Status: Full Family Communication: N/A Disposition Plan: Home at time of discharge.  MATTHEWS,MICHELLE A.  Pager [phone]. If 7PM-7AM, please contact night-coverage. 02/20/2012, 6:21 PM  LOS: 4 days   Brief narrative: [name] is a 42 y.o. male African American with a history of sickle cell anemia, diastolic CHF last 2-D echo 09/21/2007 with EF of 55-65%, chronic fatigue syndrome, gouty arthropathy, last hospitalization 12/07/2011 to 12/09/2011 4 sickle cell crisis who presents to the ED from his PCPs office with hypoxia with sats of 83% per ED report. Patient stated that went to see his PCP for followup appointment and he had started to develop a crisis with bilateral hip pain and knee pain 3 days prior to admission. Patient does endorse some shortness of breath on exertion when he and legs up or down stairs, subjective fevers, sneezing, bilateral hip and knee pain, constipation. Patient denies any chest pain, no nausea, no vomiting, no cough, no sore throat, no abdominal pain, no diarrhea, no dysuria, no weakness. Patient does endorse nocturia as well as congestion. Patient was seen in the ED with initial sats of 88% on room. Patient also noted to be in SVT with heart rate going up as high as 181  was given 6 mg of adenosine and would return to a sinus tachycardia with heart rate in the 90s to low 100s. CBC obtained showed a white count of 23.5 hemoglobin of 6.5. Chest x-ray  was worrisome for pneumonia versus edema. One unit of packed red blood cells ordered to be transfused. Will call to admit the patient for further evaluation and management   Consultants:  None  Procedures:  None  Antibiotics:  Cefepime 1/13 >>1/16  Levaquin 1/13 >>  Vancomycin 1/13 >>1/16  HPI/Subjective: Pt initially wanting to leave to go home today. Explained that he is still not medically safe to be discharged given his acute kidney injury and the worsening trend of his kidney function and increasing potassium. He vacillated with is decision and has decided to remain hospitalized for now.  Objective: Filed Vitals:   02/19/12 2231 02/20/12 0431 02/20/12 1411 02/20/12 1603  BP: 130/68 123/62 127/63 116/61  Pulse: 92 90 92 87  Temp: 98.8 F (37.1 C) 98.5 F (36.9 C) 97.9 F (36.6 C) 99.2 F (37.3 C)  TempSrc: Oral Oral Oral Oral  Resp: 20 16 18 18   Height:      Weight:  83.8 kg (184 lb 11.9 oz)    SpO2: 98% 92% 90% 95%   Weight change:   Intake/Output Summary (Last 24 hours) at 02/20/12 1821 Last data filed at 02/20/12 0752  Gross per 24 hour  Intake    360 ml  Output   2400 ml  Net  -2040 ml    General: Alert, awake, oriented x3, in no acute distress. Well appearing HEENT: [place]/AT PEERL, EOMI, Very mild icterus Heart: Regular rate and rhythm, without murmurs, rubs, gallops, PMI non-displaced, no heaves or thrills on palpation.  Lungs: Clear to auscultation, no wheezing or rhonchi noted. No rales noted Abdomen: Soft, nontender, nondistended, positive bowel sounds, no masses no hepatosplenomegaly noted. Well healed scar across abdomen.  Neuro: No focal neurological deficits noted cranial nerves II through XII grossly intact.  Strength normal in bilateral upper and lower extremities. Musculoskeletal: No warm swelling or erythema around joints, no spinal tenderness noted.     Data Reviewed: Basic Metabolic Panel:  Lab 02/20/12 1610 02/19/12 0456 02/18/12 1636  02/18/12 0830 02/18/12 0500 02/17/12 0540 02/16/12 1825  NA 140 137 138 -- 134* 138 --  K 5.0 4.9 4.8 -- 6.0* 4.6 --  CL 112 107 107 -- 105 107 --  CO2 20 20 21  -- 19 23 --  GLUCOSE 101* 85 86 -- 99 87 --  BUN 31* 32* 30* -- 26* 16 --  CREATININE 1.58* 1.57* 1.56* -- 1.44* 1.16 --  CALCIUM 8.3* 8.2* 8.2* -- 8.2* 8.5 --  MG -- -- -- 2.5 -- 2.5 1.8  PHOS -- -- -- -- -- -- --   Liver Function Tests:  Lab 02/19/12 0456 02/18/12 0500 02/17/12 0540 02/16/12 1253  AST 108* 134* 132* 152*  ALT 42 48 50 51  ALKPHOS 102 104 108 116  BILITOT 5.1* 5.6* 5.4* 9.2*  PROT 7.1 7.2 7.0 7.3  ALBUMIN 2.6* 2.7* 2.5* 2.8*   No results found for this basename: LIPASE:5,AMYLASE:5 in the last 168 hours  Lab 02/16/12 2215  AMMONIA 83*   CBC:  Lab 02/18/12 0520 02/17/12 0540 02/16/12 1825 02/16/12 1253  WBC 13.8* 22.4* 23.3* 23.5*  NEUTROABS 5.9 10.8* -- 12.0*  HGB 7.2* 7.7* 6.4* 6.5*  HCT 20.4* 22.2* 18.5* 18.2*  MCV 98.1 98.2 100.5* 98.9  PLT 183 284 286 268  Cardiac Enzymes:  Lab 02/19/12 1630 02/18/12 1636 02/18/12 1210 02/17/12 1717 02/17/12 0540 02/16/12 2215 02/16/12 1812  CKTOTAL 50 51 43 33 29 -- --  CKMB -- 1.5 1.6 1.3 1.3 1.2 --  CKMBINDEX -- -- -- -- -- -- --  TROPONINI -- -- -- -- <0.30 <0.30 <0.30   BNP (last 3 results)  Basename 02/16/12 1511 12/09/11 0755 08/14/11 0722  PROBNP 742.6* 102.5 180.9*   CBG: No results found for this basename: GLUCAP:5 in the last 168 hours  Recent Results (from the past 240 hour(s))  CULTURE, BLOOD (ROUTINE X 2)     Status: Normal (Preliminary result)   Collection Time   02/16/12  6:05 PM      Component Value Range Status Comment   Specimen Description BLOOD RIGHT HAND   Final    Special Requests NONE BOTTLES DRAWN AEROBIC AND ANAEROBIC 5CC   Final    Culture  Setup Time 02/16/2012 22:22   Final    Culture     Final    Value:        BLOOD CULTURE RECEIVED NO GROWTH TO DATE CULTURE WILL BE HELD FOR 5 DAYS BEFORE ISSUING A FINAL NEGATIVE  REPORT   Report Status PENDING   Incomplete   CULTURE, BLOOD (ROUTINE X 2)     Status: Normal (Preliminary result)   Collection Time   02/16/12  6:12 PM      Component Value Range Status Comment   Specimen Description BLOOD RIGHT HAND   Final    Special Requests NONE BOTTLES DRAWN AEROBIC AND ANAEROBIC 5CC   Final    Culture  Setup Time 02/16/2012 22:22   Final    Culture     Final    Value:        BLOOD CULTURE RECEIVED NO GROWTH TO DATE CULTURE WILL BE HELD FOR 5 DAYS BEFORE ISSUING A FINAL NEGATIVE REPORT   Report Status PENDING   Incomplete   URINE CULTURE     Status: Normal   Collection Time   02/16/12  6:44 PM      Component Value Range Status Comment   Specimen Description URINE, CLEAN CATCH   Final    Special Requests NONE   Final    Culture  Setup Time 02/16/2012 22:26   Final    Colony Count NO GROWTH   Final    Culture NO GROWTH   Final    Report Status 02/17/2012 FINAL   Final   MRSA PCR SCREENING     Status: Abnormal   Collection Time   02/16/12  9:10 PM      Component Value Range Status Comment   MRSA by PCR POSITIVE (*) NEGATIVE Final      Studies: Dg Chest 2 View  02/16/2012  *RADIOLOGY REPORT*  Clinical Data: Sickle cell crisis with shortness of breath and body aches.  Low O2 sats.  CHEST - 2 VIEW  Comparison: 12/09/2011.  Findings: Trachea is midline.  Heart is mildly enlarged.  There is mild diffuse bilateral air space disease.  No pleural fluid.  IMPRESSION: Mild diffuse bilateral air space disease which may be due to edema or pneumonia.   Original Report Authenticated By: Leanna Battles, M.D.     Scheduled Meds:    . Chlorhexidine Gluconate Cloth  6 each Topical [id number]  . enoxaparin (LOVENOX) injection  40 mg Subcutaneous Q24H  . folic acid  1 mg Oral Daily  . HYDROcodone-acetaminophen  1 tablet Oral Q4H  .  levofloxacin (LEVAQUIN) IV  750 mg Intravenous Q24H  . methadone  10 mg Oral TID  . metoprolol tartrate  12.5 mg Oral BID  . mupirocin ointment  1  application Nasal BID  . pantoprazole  40 mg Oral Q0600   Continuous Infusions:    Active Problems:  Gout  Chronic pain  Leukocytosis  Acute chest syndrome(517.3)  Hyperkalemia  Sickle cell crisis  Acute respiratory distress  HCAP (healthcare-associated pneumonia)  Hypoxia  SVT (supraventricular tachycardia)  Anemia  Jaundice  Acute renal failure

## 2012-02-20 NOTE — Progress Notes (Signed)
Subjective: No new complaints, SaO2 is high 80's to low 90's on room air, good UOP after IV lasix x 1 last night  Objective Vital signs in last 24 hours: Filed Vitals:   02/19/12 2231 02/20/12 0431 02/20/12 1411 02/20/12 1603  BP: 130/68 123/62 127/63 116/61  Pulse: 92 90 92 87  Temp: 98.8 F (37.1 C) 98.5 F (36.9 C) 97.9 F (36.6 C) 99.2 F (37.3 C)  TempSrc: Oral Oral Oral Oral  Resp: 20 16 18 18   Height:      Weight:  83.8 kg (184 lb 11.9 oz)    SpO2: 98% 92% 90% 95%   Weight change:   Intake/Output Summary (Last 24 hours) at 02/20/12 2040 Last data filed at 02/20/12 0752  Gross per 24 hour  Intake      0 ml  Output   2150 ml  Net  -2150 ml   Labs: Basic Metabolic Panel:  Lab 02/20/12 1610 02/19/12 0456 02/18/12 1636 02/18/12 0500 02/17/12 0540 02/16/12 1825 02/16/12 1511 02/16/12 1253  NA 140 137 138 134* 138 -- 139 140  K 5.0 4.9 4.8 6.0* 4.6 -- 4.2 4.3  CL 112 107 107 105 107 -- 109 109  CO2 20 20 21 19 23  -- 23 23  GLUCOSE 101* 85 86 99 87 -- 85 95  BUN 31* 32* 30* 26* 16 -- 14 15  CREATININE 1.58* 1.57* 1.56* 1.44* 1.16 0.85 0.89 --  ALB -- -- -- -- -- -- -- --  CALCIUM 8.3* 8.2* 8.2* 8.2* 8.5 -- 8.5 8.7  PHOS -- -- -- -- -- -- -- --   Liver Function Tests:  Lab 02/19/12 0456 02/18/12 0500 02/17/12 0540  AST 108* 134* 132*  ALT 42 48 50  ALKPHOS 102 104 108  BILITOT 5.1* 5.6* 5.4*  PROT 7.1 7.2 7.0  ALBUMIN 2.6* 2.7* 2.5*   No results found for this basename: LIPASE:3,AMYLASE:3 in the last 168 hours  Lab 02/16/12 2215  AMMONIA 83*   CBC:  Lab 02/18/12 0520 02/17/12 0540 02/16/12 1825 02/16/12 1253  WBC 13.8* 22.4* 23.3* 23.5*  NEUTROABS 5.9 10.8* -- 12.0*  HGB 7.2* 7.7* 6.4* 6.5*  HCT 20.4* 22.2* 18.5* 18.2*  MCV 98.1 98.2 100.5* 98.9  PLT 183 284 286 268   PT/INR: @labrcntip (inr:5) Cardiac Enzymes:  Lab 02/19/12 1630 02/18/12 1636 02/18/12 1210 02/17/12 1717 02/17/12 0540 02/16/12 2215 02/16/12 1812  CKTOTAL 50 51 43 33 29 -- --    CKMB -- 1.5 1.6 1.3 1.3 1.2 --  CKMBINDEX -- -- -- -- -- -- --  TROPONINI -- -- -- -- <0.30 <0.30 <0.30   CBG: No results found for this basename: GLUCAP:5 in the last 168 hours  Iron Studies: No results found for this basename: IRON:30,TIBC:30,TRANSFERRIN:30,FERRITIN:30 in the last 168 hours  Physical Exam:  Blood pressure 116/61, pulse 87, temperature 99.2 F (37.3 C), temperature source Oral, resp. rate 18, height 5\' 10"  (1.778 m), weight 83.8 kg (184 lb 11.9 oz), SpO2 95.00%.  Gen: alert, AAM in no distress  Skin: no rash, cyanosis  HEENT: EOMI, sclera anicteric, throat clear  Neck: JVP about 12, no LAN  Chest: bibasilar crackles 1/3 up posteriorly, less prominent today CV: regular, no rub or gallop, no murmur, no carotid bruits, pedal pulses intact  Abdomen: soft, +hepatomegaly, no ascites, nontender, +BS  Ext: no LE or UE edema, no gangrene or ulceration  Neuro: alert, Ox3, no focal deficit  UA- >300 prot, pos nit and neg LE, large  Hgb   Urine sediment (today, undersigned) > trace protein, SG 1.030, Lg blood, 1+prot, +gran casts, no rbc or wbc  Vanc- 15.1 CPK- 50 Urine prot:creat ratio > 0.84  Impression/Plan  1. Acute kidney injury in SCD patient with painful crisis receiving IV vanc, IV NSAID's and IV diuretics for pulm edema. Creat levelling off. Suspect ATN. Making urine. Patient remains borderline hypoxemic and has persistent but improved rales. Will repeat CXR; I believe this is pulm edema but don't know the cause. He is not grossly vol overloaded, BP is normal; he does have JVD and yesterday had a strange humming noise over the R IJ vein which is not there today but could have been a "venous hum" described in high-output heart failure. ECHO normal LV but IVC dilated and ?of flail leaflet raised.  Would consider getting cardiology input as to cause of "CHF".  Other cause of hypoxemia in SCD pt may need to be considered also 2. Hypoxemia- see above 3. Sickle cell disease  with painful crisis- per primary 4. Hx gout- takes NSAID"S at home   [name] Moselle  MD [facility] [phone] pgr    [phone] cell 02/20/2012, 8:40 PM

## 2012-02-20 NOTE — Procedures (Signed)
US/fluoroscopic guided right brachial vein  DL PICC placed. Tip SVC/RA junction. Length 40 cm. No immediate complications. Full report in PACS.

## 2012-02-20 NOTE — Progress Notes (Signed)
Spoke with patient's nurse  and she states that patient wants to wait for PICC procedure until he sees his physician.  He really doesn't want this procedure as he is hoping to go home soon  Will await to hear from his nurse

## 2012-02-21 DIAGNOSIS — N179 Acute kidney failure, unspecified: Secondary | ICD-10-CM

## 2012-02-21 DIAGNOSIS — G8929 Other chronic pain: Secondary | ICD-10-CM

## 2012-02-21 LAB — RENAL FUNCTION PANEL
CO2: 23 mEq/L (ref 19–32)
Calcium: 8.6 mg/dL (ref 8.4–10.5)
GFR calc Af Amer: 69 mL/min — ABNORMAL LOW (ref >90)
GFR calc non Af Amer: 60 mL/min — ABNORMAL LOW (ref >90)
Phosphorus: 3.2 mg/dL (ref 2.3–4.6)
Potassium: 4.7 mEq/L (ref 3.5–5.1)
Sodium: 137 mEq/L (ref 135–145)

## 2012-02-21 MED ORDER — SODIUM CHLORIDE 0.9 % IJ SOLN
10.0000 mL | INTRAMUSCULAR | Status: DC | PRN
  Administered 2012-02-21: 20 mL
  Administered 2012-02-22 – 2012-02-26 (×10): 10 mL

## 2012-02-21 MED ORDER — DILTIAZEM HCL ER COATED BEADS 180 MG PO CP24
180.0000 mg | ORAL_CAPSULE | Freq: Every day | ORAL | Status: DC
  Administered 2012-02-21 – 2012-02-22 (×2): 180 mg via ORAL
  Filled 2012-02-21 (×2): qty 1

## 2012-02-21 NOTE — Progress Notes (Signed)
SICKLE CELL SERVICE PROGRESS NOTE  [name] EAV:[id number] DOB: 11-09-1970 DOA: 02/16/2012 PCP: [name] Saucer [name]  Assessment/Plan: Active Problems: Acute Kidney Injury: Suspect ATN associated with NSAID's and possibly vancomycin. Pt endorses that prior to hospitalization, he was taking NSAID's frequently (even though cautioned against it) for knee pain.  NSAID's discontinued.  IVF discontinued per Nephrology secondary to rales. Kidney function is improved. Nephrology's recommendation noted. Consulted cardiology for abnormality seen on echo in addition to the SVT.  Hyperkalemia: Resolved  Sickle cell crisis: Pt feels that his pain is under good control on oral medications.  HCAP (healthcare-associated pneumonia) vs. Pulmonary Edema vs  Acute chest syndrome(517.3): Clinically patient is not ill appearing. His CXR shows an interstitial pattern. This could represent Pneumonia vs Pulmonary edema vs. Acute Chest syndrome.  Pt on day #6/8 of Levaquin.  Acute respiratory distress: Improved. On 1 L/min of oxygen. Wean off oxygen to keep sats greater than 92%.  Leukocytosis: May be related to crisis, but may also be related to a pneumonia. Was trending down nicely. Recheck CBC in the morning.   SVT (supraventricular tachycardia): Pt had an episode of SVT in the ER which was treated with Adenosine. Pt relates that he has intermittent tachycardia that is not related to pain. He denies any lightheadedness or syncope with these episodes. Patient had some abnormality on echo. Question of flailed leaflet. With the combination of SVT an echo abnormality consulted cardiology.   Anemia: Pt s/p transfusion of 1 unit PRBC. Repeat CBC in the morning.  Code Status: Full Family Communication: N/A Disposition Plan: Home at time of discharge.  [name]  Pager [phone]. If 7PM-7AM, please contact night-coverage. 02/21/2012, 11:33 AM  LOS: 5 days   Brief narrative: [name] is a 42 y.o. male  African American with a history of sickle cell anemia, diastolic CHF last 2-D echo 09/21/2007 with EF of 55-65%, chronic fatigue syndrome, gouty arthropathy, last hospitalization 12/07/2011 to 12/09/2011 4 sickle cell crisis who presents to the ED from his PCPs office with hypoxia with sats of 83% per ED report. Patient stated that went to see his PCP for followup appointment and he had started to develop a crisis with bilateral hip pain and knee pain 3 days prior to admission. Patient does endorse some shortness of breath on exertion when he and legs up or down stairs, subjective fevers, sneezing, bilateral hip and knee pain, constipation. Patient denies any chest pain, no nausea, no vomiting, no cough, no sore throat, no abdominal pain, no diarrhea, no dysuria, no weakness. Patient does endorse nocturia as well as congestion. Patient was seen in the ED with initial sats of 88% on room. Patient also noted to be in SVT with heart rate going up as high as 181 was given 6 mg of adenosine and would return to a sinus tachycardia with heart rate in the 90s to low 100s. CBC obtained showed a white count of 23.5 hemoglobin of 6.5. Chest x-ray was worrisome for pneumonia versus edema. One unit of packed red blood cells ordered to be transfused. Will call to admit the patient for further evaluation and management   Consultants:  None  Procedures:  None  Antibiotics:  Cefepime 1/13 >>1/16  Levaquin 1/13 >>  Vancomycin 1/13 >>1/16  HPI/Subjective: Pt initially wanting to leave to go home today. Explained that he is still not medically safe to be discharged given his acute kidney injury and the worsening trend of his kidney function and increasing potassium. He vacillated  with is decision and has decided to remain hospitalized for now.  Objective: Filed Vitals:   02/20/12 1411 02/20/12 1603 02/20/12 2110 02/21/12 0426  BP: 127/63 116/61 113/57 117/56  Pulse: 92 87 90 78  Temp: 97.9 F (36.6 C) 99.2  F (37.3 C) 99.1 F (37.3 C) 98.3 F (36.8 C)  TempSrc: Oral Oral  Oral  Resp: 18 18 19 20   Height:      Weight:    83.1 kg (183 lb 3.2 oz)  SpO2: 90% 95% 92% 97%   Weight change: -2.6 kg (-5 lb 11.7 oz)  Intake/Output Summary (Last 24 hours) at 02/21/12 1133 Last data filed at 02/21/12 0900  Gross per 24 hour  Intake    360 ml  Output    575 ml  Net   -215 ml    General: Alert, awake, oriented x3, in no acute distress. Well appearing HEENT: [place]/AT PEERL, EOMI, Very mild icterus Heart: Regular rate and rhythm, without murmurs, rubs, gallops, PMI non-displaced, no heaves or thrills on palpation.  Lungs: Clear to auscultation, no wheezing or rhonchi noted. No rales noted Abdomen: Soft, nontender, nondistended, positive bowel sounds, no masses no hepatosplenomegaly noted. Well healed scar across abdomen.  Neuro: No focal neurological deficits noted cranial nerves II through XII grossly intact.  Strength normal in bilateral upper and lower extremities. Musculoskeletal: No warm swelling or erythema around joints, no spinal tenderness noted.     Data Reviewed: Basic Metabolic Panel:  Lab 02/21/12 1610 02/20/12 0450 02/19/12 0456 02/18/12 1636 02/18/12 0830 02/18/12 0500 02/17/12 0540 02/16/12 1825  NA 137 140 137 138 -- 134* -- --  K 4.7 5.0 4.9 4.8 -- 6.0* -- --  CL 109 112 107 107 -- 105 -- --  CO2 23 20 20 21  -- 19 -- --  GLUCOSE 105* 101* 85 86 -- 99 -- --  BUN 26* 31* 32* 30* -- 26* -- --  CREATININE 1.44* 1.58* 1.57* 1.56* -- 1.44* -- --  CALCIUM 8.6 8.3* 8.2* 8.2* -- 8.2* -- --  MG -- -- -- -- 2.5 -- 2.5 1.8  PHOS 3.2 -- -- -- -- -- -- --   Liver Function Tests:  Lab 02/21/12 0425 02/19/12 0456 02/18/12 0500 02/17/12 0540 02/16/12 1253  AST -- 108* 134* 132* 152*  ALT -- 42 48 50 51  ALKPHOS -- 102 104 108 116  BILITOT -- 5.1* 5.6* 5.4* 9.2*  PROT -- 7.1 7.2 7.0 7.3  ALBUMIN 2.7* 2.6* 2.7* 2.5* 2.8*    Lab 02/16/12 2215  AMMONIA 83*   CBC:  Lab 02/18/12  0520 02/17/12 0540 02/16/12 1825 02/16/12 1253  WBC 13.8* 22.4* 23.3* 23.5*  NEUTROABS 5.9 10.8* -- 12.0*  HGB 7.2* 7.7* 6.4* 6.5*  HCT 20.4* 22.2* 18.5* 18.2*  MCV 98.1 98.2 100.5* 98.9  PLT 183 284 286 268   Cardiac Enzymes:  Lab 02/19/12 1630 02/18/12 1636 02/18/12 1210 02/17/12 1717 02/17/12 0540 02/16/12 2215 02/16/12 1812  CKTOTAL 50 51 43 33 29 -- --  CKMB -- 1.5 1.6 1.3 1.3 1.2 --  CKMBINDEX -- -- -- -- -- -- --  TROPONINI -- -- -- -- <0.30 <0.30 <0.30   BNP (last 3 results)  Basename 02/16/12 1511 12/09/11 0755 08/14/11 0722  PROBNP 742.6* 102.5 180.9*    Recent Results (from the past 240 hour(s))  CULTURE, BLOOD (ROUTINE X 2)     Status: Normal (Preliminary result)   Collection Time   02/16/12  6:05 PM  Component Value Range Status Comment   Specimen Description BLOOD RIGHT HAND   Final    Special Requests NONE BOTTLES DRAWN AEROBIC AND ANAEROBIC 5CC   Final    Culture  Setup Time 02/16/2012 22:22   Final    Culture     Final    Value:        BLOOD CULTURE RECEIVED NO GROWTH TO DATE CULTURE WILL BE HELD FOR 5 DAYS BEFORE ISSUING A FINAL NEGATIVE REPORT   Report Status PENDING   Incomplete   CULTURE, BLOOD (ROUTINE X 2)     Status: Normal (Preliminary result)   Collection Time   02/16/12  6:12 PM      Component Value Range Status Comment   Specimen Description BLOOD RIGHT HAND   Final    Special Requests NONE BOTTLES DRAWN AEROBIC AND ANAEROBIC 5CC   Final    Culture  Setup Time 02/16/2012 22:22   Final    Culture     Final    Value:        BLOOD CULTURE RECEIVED NO GROWTH TO DATE CULTURE WILL BE HELD FOR 5 DAYS BEFORE ISSUING A FINAL NEGATIVE REPORT   Report Status PENDING   Incomplete   URINE CULTURE     Status: Normal   Collection Time   02/16/12  6:44 PM      Component Value Range Status Comment   Specimen Description URINE, CLEAN CATCH   Final    Special Requests NONE   Final    Culture  Setup Time 02/16/2012 22:26   Final    Colony Count NO GROWTH    Final    Culture NO GROWTH   Final    Report Status 02/17/2012 FINAL   Final   MRSA PCR SCREENING     Status: Abnormal   Collection Time   02/16/12  9:10 PM      Component Value Range Status Comment   MRSA by PCR POSITIVE (*) NEGATIVE Final      Studies: Dg Chest 2 View  02/16/2012  *RADIOLOGY REPORT*  Clinical Data: Sickle cell crisis with shortness of breath and body aches.  Low O2 sats.  CHEST - 2 VIEW  Comparison: 12/09/2011.  Findings: Trachea is midline.  Heart is mildly enlarged.  There is mild diffuse bilateral air space disease.  No pleural fluid.  IMPRESSION: Mild diffuse bilateral air space disease which may be due to edema or pneumonia.   Original Report Authenticated By: Leanna Battles, M.D.     Scheduled Meds:    . Chlorhexidine Gluconate Cloth  6 each Topical [id number]  . enoxaparin (LOVENOX) injection  40 mg Subcutaneous Q24H  . folic acid  1 mg Oral Daily  . HYDROcodone-acetaminophen  1 tablet Oral Q4H  . levofloxacin (LEVAQUIN) IV  750 mg Intravenous Q24H  . methadone  10 mg Oral TID  . metoprolol tartrate  12.5 mg Oral BID  . mupirocin ointment  1 application Nasal BID  . pantoprazole  40 mg Oral Q0600   Continuous Infusions:    Active Problems:  Gout  Chronic pain  Leukocytosis  Acute chest syndrome(517.3)  Hyperkalemia  Sickle cell crisis  Acute respiratory distress  HCAP (healthcare-associated pneumonia)  Hypoxia  SVT (supraventricular tachycardia)  Anemia  Jaundice  Acute renal failure

## 2012-02-21 NOTE — Progress Notes (Signed)
Subjective: No new complaints, creat down slightly, UOP about 1 L yest  Objective Vital signs in last 24 hours: Filed Vitals:   02/20/12 1603 02/20/12 2110 02/21/12 0426 02/21/12 1433  BP: 116/61 113/57 117/56 120/53  Pulse: 87 90 78 92  Temp: 99.2 F (37.3 C) 99.1 F (37.3 C) 98.3 F (36.8 C) 98.7 F (37.1 C)  TempSrc: Oral  Oral Oral  Resp: 18 19 20 20   Height:      Weight:   83.1 kg (183 lb 3.2 oz)   SpO2: 95% 92% 97% 98%   Weight change: -2.6 kg (-5 lb 11.7 oz)  Intake/Output Summary (Last 24 hours) at 02/21/12 1729 Last data filed at 02/21/12 1437  Gross per 24 hour  Intake    480 ml  Output   1000 ml  Net   -520 ml   Labs: Basic Metabolic Panel:  Lab 02/21/12 4098 02/20/12 0450 02/19/12 0456 02/18/12 1636 02/18/12 0500 02/17/12 0540 02/16/12 1825 02/16/12 1511  NA 137 140 137 138 134* 138 -- 139  K 4.7 5.0 4.9 4.8 6.0* 4.6 -- 4.2  CL 109 112 107 107 105 107 -- 109  CO2 23 20 20 21 19 23  -- 23  GLUCOSE 105* 101* 85 86 99 87 -- 85  BUN 26* 31* 32* 30* 26* 16 -- 14  CREATININE 1.44* 1.58* 1.57* 1.56* 1.44* 1.16 0.85 --  ALB -- -- -- -- -- -- -- --  CALCIUM 8.6 8.3* 8.2* 8.2* 8.2* 8.5 -- 8.5  PHOS 3.2 -- -- -- -- -- -- --   Liver Function Tests:  Lab 02/21/12 0425 02/19/12 0456 02/18/12 0500 02/17/12 0540  AST -- 108* 134* 132*  ALT -- 42 48 50  ALKPHOS -- 102 104 108  BILITOT -- 5.1* 5.6* 5.4*  PROT -- 7.1 7.2 7.0  ALBUMIN 2.7* 2.6* 2.7* --   No results found for this basename: LIPASE:3,AMYLASE:3 in the last 168 hours  Lab 02/16/12 2215  AMMONIA 83*   CBC:  Lab 02/18/12 0520 02/17/12 0540 02/16/12 1825 02/16/12 1253  WBC 13.8* 22.4* 23.3* 23.5*  NEUTROABS 5.9 10.8* -- 12.0*  HGB 7.2* 7.7* 6.4* 6.5*  HCT 20.4* 22.2* 18.5* 18.2*  MCV 98.1 98.2 100.5* 98.9  PLT 183 284 286 268   PT/INR: @labrcntip (inr:5) Cardiac Enzymes:  Lab 02/19/12 1630 02/18/12 1636 02/18/12 1210 02/17/12 1717 02/17/12 0540 02/16/12 2215 02/16/12 1812  CKTOTAL 50 51 43 33  29 -- --  CKMB -- 1.5 1.6 1.3 1.3 1.2 --  CKMBINDEX -- -- -- -- -- -- --  TROPONINI -- -- -- -- <0.30 <0.30 <0.30   CBG: No results found for this basename: GLUCAP:5 in the last 168 hours  Iron Studies: No results found for this basename: IRON:30,TIBC:30,TRANSFERRIN:30,FERRITIN:30 in the last 168 hours  Physical Exam:  Blood pressure 120/53, pulse 92, temperature 98.7 F (37.1 C), temperature source Oral, resp. rate 20, height 5\' 10"  (1.778 m), weight 83.1 kg (183 lb 3.2 oz), SpO2 98.00%.  Gen: alert, AAM in no distress  Skin: no rash, cyanosis  HEENT: EOMI, sclera anicteric, throat clear  Neck: JVP about 12, no LAN  Chest: faint basilar rales bilat CV: regular, no rub or gallop, no murmur, no carotid bruits, pedal pulses intact  Abdomen: soft, +hepatomegaly, no ascites, nontender, +BS  Ext: no LE or UE edema, no gangrene or ulceration  Neuro: alert, Ox3, no focal deficit   UA- >300 prot, pos nit and neg LE, large Hgb  Urine  sediment (today, undersigned) > trace protein, SG 1.030, Lg blood, 1+prot, +gran casts, no rbc or wbc  Vanc- 15.1 CPK- 50 Urine prot:creat ratio > 0.84  Impression/Plan  1. AKI due to diuresis/IV NSAID's +/- vanc IV. Suspected ATN, improving. Stopped fluids due to signs of pulm edema (low sats, rales, jvd)- improving. CHF not explained by vol overload or renal failure, cardiology consulted. Will follow.  2. Sickle cell disease with painful crisis- per primary 3. Hx gout- takes NSAID"S at home   Vinson Moselle  MD [facility] [phone] pgr    [phone] cell 02/21/2012, 5:29 PM

## 2012-02-21 NOTE — Consult Note (Signed)
Cardiology Consult Note  Admit date: 02/16/2012 Name: [name] 42 y.o.  male DOB:  07-Jan-1971 MRN:  [id number]  Today's date:  02/21/2012  Referring Physician:    [facility]  Reason for Consultation:  Mitral regurgitation, cardiac arrhythmia, sickle cell  IMPRESSIONS: 1. Mild to moderate mitral regurgitation likely due to rupture of small cordae tendineae on the posterior mitral valve leaflet seen on transthoracic echocardiogram. 2. Sickle cell anemia with painful crises 3. Hypoxia and recent respiratory distress versus pneumonia 4. Supraventricular tachycardia resolved 5. Acute renal failure thought due to nonsteroidal anti-inflammatory agents 6. Probable diastolic heart failure due to demand tachycardia as well as anemia and LVH.  RECOMMENDATION: Clinically he I doubt that much her regurgitation is severe or the cause of his symptoms. He did have supraventricular tachycardia and had an hypoxic and  painful crisis on admission. His BNP level was mildly elevated.  At this point in time I reviewed the echocardiogram and it appears that he does have ruptured chordae tendon day. Degree of mitral regurgitation did not appear severe and clinically he is not in fulminant congestive heart failure. I would repeat a BNP at this point. I would continue to let him get over the sickle cell crisis and treat things. At some point he might be a candidate for a TEE to further characterize the mitral valve. For the time being however I don't think is necessary to be done during this admission. He will need to have serial echocardiograms to follow the mitral regurgitation however.  1. Add low dose of diltiazem to prevent recurrence of SVT and help diastolic function 2. Follow renal function. 3. We'll follow with you while he is hospitalized.  HISTORY: This 42 year-old black male has a long-standing history of sickle cell anemia with multiple admissions for painful crises. He was seen by  [facility] cardiology in 2009 and was found to have LVH on echo and what was said to be mild mitral regurgitation at that time. He normally does not have anginal type chest pain and when he is not a crises normally feels relatively good. He was admitted to the hospital with a painful crises 3 days prior to admission and then had dyspnea on exertion when he would go up and down stairs. He did not have chest pain. He has some nocturia. He was hypoxic in the emergency room and was also found to be in SVT with heart rates going up as high as 181. He was given adenosine with reversion to sinus rhythm. He has been treated since then for pneumonia. Echocardiogram showed was felt to be some ruptured cords on the posterior mitral valve leaflet with mild mitral regurgitation. Notion was made of 2 separate jets neither of which were severe. He also developed some renal insufficiency and has been seen by the nephrologist.  Past Medical History  Diagnosis Date  . CHF (congestive heart failure)   . Gouty arthropathy   . Chronic fatigue syndrome   . Sickle cell anemia       Past Surgical History  Procedure Date  . Appendectomy   . Cholecystectomy   . Splenectomy      Allergies:   has no known allergies.   Medications: Prior to Admission medications   Medication Sig Start Date End Date Taking? Authorizing Provider  ibuprofen (ADVIL,MOTRIN) 200 MG tablet Take 400-800 mg by mouth every 6 (six) hours as needed. For pain   Yes Historical Provider, MD  methadone (DOLOPHINE) 10 MG tablet Take 1  tablet (10 mg total) by mouth 3 (three) times daily. 08/14/11  Yes Grayce Sessions, NP  sodium chloride (OCEAN) 0.65 % nasal spray Place 1 spray into the nose as needed. For nasal congestion   Yes Historical Provider, MD  zolpidem (AMBIEN) 10 MG tablet Take 10 mg by mouth at bedtime as needed. For sleep.   Yes Historical Provider, MD    Family History: Family Status  Relation Status Death Age  . Mother Alive      Social History:   reports that he has never smoked. He does not have any smokeless tobacco history on file. He reports that he does not drink alcohol or use illicit drugs.   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  Estranged from his biological father.    Review of Systems: Otherwise unremarkable except as noted above.  Physical Exam: BP 120/53  Pulse 92  Temp 98.7 F (37.1 C) (Oral)  Resp 20  Ht 5\' 10"  (1.778 m)  Wt 83.1 kg (183 lb 3.2 oz)  BMI 26.29 kg/m2  SpO2 98%  General appearance: alert, cooperative, appears stated age and no distress Head: Normocephalic, without obvious abnormality, atraumatic Neck: no adenopathy, no carotid bruit, no JVD and supple, symmetrical, trachea midline Lungs: Mild decreased breath sounds at bases, no definite rales Heart: Regular rhythm, normal S1-S2, 2/6 systolic murmur heard at the left sternal border and at the apex consistent with mitral regurgitation. No diastolic murmur noted. Abdomen: soft, non-tender; bowel sounds normal; no masses,  no organomegaly Rectal: deferred Extremities: extremities normal, atraumatic, no cyanosis or edema Pulses: 2+ and symmetric Skin: Skin color, texture, turgor normal. No rashes or lesions Neurologic: Grossly normal  Labs:  CMP   Basename 02/21/12 0425 02/19/12 0456  NA 137 --  K 4.7 --  CL 109 --  CO2 23 --  GLUCOSE 105* --  BUN 26* --  CREATININE 1.44* --  CALCIUM 8.6 --  PROT -- 7.1  ALBUMIN 2.7* --  AST -- 108*  ALT -- 42  ALKPHOS -- 102  BILITOT -- 5.1*  GFRNONAA 60* --  GFRAA 69* --   BNP (last 3 results)  Basename 02/16/12 1511 12/09/11 0755 08/14/11 0722  PROBNP 742.6* 102.5 180.9*   Cardiac Panel (last 3 results)  Basename 02/19/12 1630  CKTOTAL 50  CKMB --  TROPONINI --  RELINDX --     Radiology: Cardiomegaly, mild interstitial changes at bases, no definite pulmonary edema  EKG: EKG on admission showed supraventricular tachycardia at a rate of  164  Signed:  W. Ashley Royalty MD [facility]   Cardiology Consultant  02/21/2012, 6:06 PM

## 2012-02-22 ENCOUNTER — Inpatient Hospital Stay (HOSPITAL_COMMUNITY): Payer: BC Managed Care – PPO

## 2012-02-22 DIAGNOSIS — I498 Other specified cardiac arrhythmias: Secondary | ICD-10-CM

## 2012-02-22 LAB — BASIC METABOLIC PANEL
BUN: 27 mg/dL — ABNORMAL HIGH (ref 6–23)
Creatinine, Ser: 1.53 mg/dL — ABNORMAL HIGH (ref 0.50–1.35)
GFR calc Af Amer: 64 mL/min — ABNORMAL LOW (ref >90)
GFR calc non Af Amer: 55 mL/min — ABNORMAL LOW (ref >90)
Potassium: 4.6 mEq/L (ref 3.5–5.1)

## 2012-02-22 LAB — CBC
MCHC: 36.7 g/dL — ABNORMAL HIGH (ref 30.0–36.0)
Platelets: 226 10*3/uL (ref 150–400)
RDW: 28.9 % — ABNORMAL HIGH (ref 11.5–15.5)
WBC: 16.6 10*3/uL — ABNORMAL HIGH (ref 4.0–10.5)

## 2012-02-22 LAB — CULTURE, BLOOD (ROUTINE X 2): Culture: NO GROWTH

## 2012-02-22 LAB — PRO B NATRIURETIC PEPTIDE: Pro B Natriuretic peptide (BNP): 1674 pg/mL — ABNORMAL HIGH (ref 0–125)

## 2012-02-22 MED ORDER — FUROSEMIDE 10 MG/ML IJ SOLN
60.0000 mg | Freq: Two times a day (BID) | INTRAMUSCULAR | Status: DC
  Administered 2012-02-22 – 2012-02-23 (×2): 60 mg via INTRAVENOUS
  Filled 2012-02-22 (×4): qty 6

## 2012-02-22 MED ORDER — DIPHENHYDRAMINE HCL 50 MG/ML IJ SOLN
25.0000 mg | Freq: Once | INTRAMUSCULAR | Status: AC
  Administered 2012-02-22: 25 mg via INTRAVENOUS
  Filled 2012-02-22: qty 1

## 2012-02-22 NOTE — Progress Notes (Signed)
Pt up per orders with staff for pulse ox check while on ra & ambulating. Started with 99%, but dropped to 83% by the time pt reached the nurses station. Dr Earlene Plater notified. Orders received. [name], [employer]

## 2012-02-22 NOTE — Progress Notes (Signed)
SICKLE CELL SERVICE PROGRESS NOTE  [name] WNU:[id number] DOB: 22-Jan-1971 DOA: 02/16/2012 PCP: August Saucer ERIC, MD  Assessment/Plan: Active Problems: Acute Kidney Injury: Suspect ATN associated with NSAID's and possibly vancomycin. Pt endorses that prior to hospitalization, he was taking NSAID's frequently (even though cautioned against it) for knee pain.  NSAID's discontinued.  IVF discontinued per Nephrology secondary to rales. Kidney function is about the same.  Hyperkalemia: Resolved  Sickle cell crisis: Pt feels that his pain is under good control on oral medications.  HCAP (healthcare-associated pneumonia) vs. Pulmonary Edema vs  Acute chest syndrome(517.3): Clinically patient is not ill appearing. His CXR shows an interstitial pattern. This could represent Pneumonia vs Pulmonary edema vs. Acute Chest syndrome.  Pt on day #7/8 of Levaquin.  Patient is still very hypoxic. His oxygen level is 83% on room air. On sure of the reason for his hypoxia. May need to get a pulmonary consult tomorrow.  Acute respiratory distress: Improved. On 1 L/min of oxygen. Try to wean off oxygen today for discharge. Oxygen level was 83% on room air.  Leukocytosis: May be related to crisis, but may also be related to a pneumonia. Was trending down nicely.    SVT (supraventricular tachycardia): Pt had an episode of SVT in the ER which was treated with Adenosine. Pt relates that he has intermittent tachycardia that is not related to pain. He denies any lightheadedness or syncope with these episodes. Patient had some abnormality on echo. Question of flailed leaflet. With the combination of SVT an echo abnormality  Cardiology was consulted. Patient is presently on Cardizem and metoprolol. He will follow up outpatient with cardiology in 4-6 weeks..   Anemia: Pt s/p transfusion of 1 unit PRBC. Hemoglobin ordered and is pending today. Code Status: Full Family Communication: N/A Disposition Plan: Home at time of  discharge.  [name]  Pager [phone]. If 7PM-7AM, please contact night-coverage. 02/21/2012, 11:33 AM  LOS: 5 days   Brief narrative: [name] is a 42 y.o. male African American with a history of sickle cell anemia, diastolic CHF last 2-D echo 09/21/2007 with EF of 55-65%, chronic fatigue syndrome, gouty arthropathy, last hospitalization 12/07/2011 to 12/09/2011 4 sickle cell crisis who presents to the ED from his PCPs office with hypoxia with sats of 83% per ED report. Patient stated that went to see his PCP for followup appointment and he had started to develop a crisis with bilateral hip pain and knee pain 3 days prior to admission. Patient does endorse some shortness of breath on exertion when he and legs up or down stairs, subjective fevers, sneezing, bilateral hip and knee pain, constipation. Patient denies any chest pain, no nausea, no vomiting, no cough, no sore throat, no abdominal pain, no diarrhea, no dysuria, no weakness. Patient does endorse nocturia as well as congestion. Patient was seen in the ED with initial sats of 88% on room. Patient also noted to be in SVT with heart rate going up as high as 181 was given 6 mg of adenosine and would return to a sinus tachycardia with heart rate in the 90s to low 100s. CBC obtained showed a white count of 23.5 hemoglobin of 6.5. Chest x-ray was worrisome for pneumonia versus edema. One unit of packed red blood cells ordered to be transfused. Will call to admit the patient for further evaluation and management   Consultants:  None  Procedures:  None  Antibiotics:  Cefepime 1/13 >>1/16  Levaquin 1/13 >>  Vancomycin 1/13 >>1/16  HPI/Subjective:  Patient feels fine. However his oxygen never still is 83% on room air. Will recheck again tomorrow. If low then may consult pulmonary. Hemoglobin is also low. Patient may need one more unit of blood transfusion.   Objective: Filed Vitals:   02/20/12 1411 02/20/12 1603 02/20/12  2110 02/21/12 0426  BP: 127/63 116/61 113/57 117/56  Pulse: 92 87 90 78  Temp: 97.9 F (36.6 C) 99.2 F (37.3 C) 99.1 F (37.3 C) 98.3 F (36.8 C)  TempSrc: Oral Oral  Oral  Resp: 18 18 19 20   Height:      Weight:    83.1 kg (183 lb 3.2 oz)  SpO2: 90% 95% 92% 97%   Weight change: -2.6 kg (-5 lb 11.7 oz)  Intake/Output Summary (Last 24 hours) at 02/21/12 1133 Last data filed at 02/21/12 0900  Gross per 24 hour  Intake    360 ml  Output    575 ml  Net   -215 ml    General: Alert, awake, oriented x3, in no acute distress. Well appearing HEENT: [place]/AT PEERL, EOMI, Very mild icterus Heart: Regular rate and rhythm, without murmurs, rubs, gallops, PMI non-displaced, no heaves or thrills on palpation.  Lungs: Clear to auscultation, no wheezing or rhonchi noted. No rales noted Abdomen: Soft, nontender, nondistended, positive bowel sounds, no masses no hepatosplenomegaly noted. Well healed scar across abdomen.  Neuro: No focal neurological deficits noted cranial nerves II through XII grossly intact.  Strength normal in bilateral upper and lower extremities. Musculoskeletal: No warm swelling or erythema around joints, no spinal tenderness noted.     Data Reviewed: Basic Metabolic Panel:  Lab 02/21/12 1610 02/20/12 0450 02/19/12 0456 02/18/12 1636 02/18/12 0830 02/18/12 0500 02/17/12 0540 02/16/12 1825  NA 137 140 137 138 -- 134* -- --  K 4.7 5.0 4.9 4.8 -- 6.0* -- --  CL 109 112 107 107 -- 105 -- --  CO2 23 20 20 21  -- 19 -- --  GLUCOSE 105* 101* 85 86 -- 99 -- --  BUN 26* 31* 32* 30* -- 26* -- --  CREATININE 1.44* 1.58* 1.57* 1.56* -- 1.44* -- --  CALCIUM 8.6 8.3* 8.2* 8.2* -- 8.2* -- --  MG -- -- -- -- 2.5 -- 2.5 1.8  PHOS 3.2 -- -- -- -- -- -- --   Liver Function Tests:  Lab 02/21/12 0425 02/19/12 0456 02/18/12 0500 02/17/12 0540 02/16/12 1253  AST -- 108* 134* 132* 152*  ALT -- 42 48 50 51  ALKPHOS -- 102 104 108 116  BILITOT -- 5.1* 5.6* 5.4* 9.2*  PROT -- 7.1 7.2  7.0 7.3  ALBUMIN 2.7* 2.6* 2.7* 2.5* 2.8*    Lab 02/16/12 2215  AMMONIA 83*   CBC:  Lab 02/18/12 0520 02/17/12 0540 02/16/12 1825 02/16/12 1253  WBC 13.8* 22.4* 23.3* 23.5*  NEUTROABS 5.9 10.8* -- 12.0*  HGB 7.2* 7.7* 6.4* 6.5*  HCT 20.4* 22.2* 18.5* 18.2*  MCV 98.1 98.2 100.5* 98.9  PLT 183 284 286 268   Cardiac Enzymes:  Lab 02/19/12 1630 02/18/12 1636 02/18/12 1210 02/17/12 1717 02/17/12 0540 02/16/12 2215 02/16/12 1812  CKTOTAL 50 51 43 33 29 -- --  CKMB -- 1.5 1.6 1.3 1.3 1.2 --  CKMBINDEX -- -- -- -- -- -- --  TROPONINI -- -- -- -- <0.30 <0.30 <0.30   BNP (last 3 results)  Basename 02/16/12 1511 12/09/11 0755 08/14/11 0722  PROBNP 742.6* 102.5 180.9*    Recent Results (from the past 240 hour(s))  CULTURE, BLOOD (ROUTINE X 2)     Status: Normal (Preliminary result)   Collection Time   02/16/12  6:05 PM      Component Value Range Status Comment   Specimen Description BLOOD RIGHT HAND   Final    Special Requests NONE BOTTLES DRAWN AEROBIC AND ANAEROBIC 5CC   Final    Culture  Setup Time 02/16/2012 22:22   Final    Culture     Final    Value:        BLOOD CULTURE RECEIVED NO GROWTH TO DATE CULTURE WILL BE HELD FOR 5 DAYS BEFORE ISSUING A FINAL NEGATIVE REPORT   Report Status PENDING   Incomplete   CULTURE, BLOOD (ROUTINE X 2)     Status: Normal (Preliminary result)   Collection Time   02/16/12  6:12 PM      Component Value Range Status Comment   Specimen Description BLOOD RIGHT HAND   Final    Special Requests NONE BOTTLES DRAWN AEROBIC AND ANAEROBIC 5CC   Final    Culture  Setup Time 02/16/2012 22:22   Final    Culture     Final    Value:        BLOOD CULTURE RECEIVED NO GROWTH TO DATE CULTURE WILL BE HELD FOR 5 DAYS BEFORE ISSUING A FINAL NEGATIVE REPORT   Report Status PENDING   Incomplete   URINE CULTURE     Status: Normal   Collection Time   02/16/12  6:44 PM      Component Value Range Status Comment   Specimen Description URINE, CLEAN CATCH   Final     Special Requests NONE   Final    Culture  Setup Time 02/16/2012 22:26   Final    Colony Count NO GROWTH   Final    Culture NO GROWTH   Final    Report Status 02/17/2012 FINAL   Final   MRSA PCR SCREENING     Status: Abnormal   Collection Time   02/16/12  9:10 PM      Component Value Range Status Comment   MRSA by PCR POSITIVE (*) NEGATIVE Final      Studies: Dg Chest 2 View  02/16/2012  *RADIOLOGY REPORT*  Clinical Data: Sickle cell crisis with shortness of breath and body aches.  Low O2 sats.  CHEST - 2 VIEW  Comparison: 12/09/2011.  Findings: Trachea is midline.  Heart is mildly enlarged.  There is mild diffuse bilateral air space disease.  No pleural fluid.  IMPRESSION: Mild diffuse bilateral air space disease which may be due to edema or pneumonia.   Original Report Authenticated By: Leanna Battles, M.D.     Scheduled Meds:    . Chlorhexidine Gluconate Cloth  6 each Topical [id number]  . enoxaparin (LOVENOX) injection  40 mg Subcutaneous Q24H  . folic acid  1 mg Oral Daily  . HYDROcodone-acetaminophen  1 tablet Oral Q4H  . levofloxacin (LEVAQUIN) IV  750 mg Intravenous Q24H  . methadone  10 mg Oral TID  . metoprolol tartrate  12.5 mg Oral BID  . mupirocin ointment  1 application Nasal BID  . pantoprazole  40 mg Oral Q0600   Continuous Infusions:    Active Problems:  Gout  Chronic pain  Leukocytosis  Acute chest syndrome(517.3)  Hyperkalemia  Sickle cell crisis  Acute respiratory distress  HCAP (healthcare-associated pneumonia)  Hypoxia  SVT (supraventricular tachycardia)  Anemia  Jaundice  Acute renal failure

## 2012-02-22 NOTE — Progress Notes (Signed)
   Subjective:  Denies CP or dyspnea   Objective:  Filed Vitals:   02/21/12 1433 02/21/12 2045 02/21/12 2259 02/22/12 0507  BP: 120/53 122/62 119/56 119/61  Pulse: 92 96 85 77  Temp: 98.7 F (37.1 C) 99.9 F (37.7 C)  98.1 F (36.7 C)  TempSrc: Oral Oral  Oral  Resp: 20 18  18   Height:      Weight:    185 lb 10 oz (84.2 kg)  SpO2: 98% 94%  96%    Intake/Output from previous day:  Intake/Output Summary (Last 24 hours) at 02/22/12 0731 Last data filed at 02/22/12 1610  Gross per 24 hour  Intake    480 ml  Output    725 ml  Net   -245 ml    Physical Exam: Physical exam: Well-developed well-nourished in no acute distress.  Skin is warm and dry.  HEENT is normal.  Neck is supple.  Chest is clear to auscultation with normal expansion.  Cardiovascular exam is regular rate and rhythm.  Abdominal exam nontender or distended. No masses palpated. Extremities show no edema. neuro grossly intact    Lab Results: Basic Metabolic Panel:  Basename 02/22/12 0345 02/21/12 0425  NA 136 137  K 4.6 4.7  CL 107 109  CO2 21 23  GLUCOSE 101* 105*  BUN 27* 26*  CREATININE 1.53* 1.44*  CALCIUM 8.6 8.6  MG -- --  PHOS -- 3.2   Cardiac Enzymes:  Basename 02/19/12 1630  CKTOTAL 50  CKMB --  CKMBINDEX --  TROPONINI --     Assessment/Plan:  1 SVT-continue beta blocker and Cardizem. If he has more frequent episodes following discharge will refer to electrophysiology for ablation. 2 mitral regurgitation-plan followup echocardiograms in the future. 3 sickle cell crisis-management per primary care. 4 acute kidney injury-nephrology following. 5 pneumonia-management per primary care. Cardiology we'll sign off. Please call with questions. He can followup with me 4-6 weeks after discharge.  Olga Millers 02/22/2012, 7:31 AM

## 2012-02-22 NOTE — Progress Notes (Addendum)
Lab called to floor with hgb=5.8 for this pt. Dr Earlene Plater notified & orders received. Lubertha Leite, [employer]

## 2012-02-22 NOTE — Progress Notes (Addendum)
Subjective: Room air sat was low 80's today, plans for d/c cancelled. CXR showed mild progression of moderate CHF per report, creat unchanged, UOP 725 cc out yest  Objective Vital signs in last 24 hours: Filed Vitals:   02/22/12 1205 02/22/12 1640 02/22/12 1710 02/22/12 1810  BP:  112/60 117/50 119/56  Pulse:  81 78 77  Temp:  98.6 F (37 C) 98.7 F (37.1 C) 98.2 F (36.8 C)  TempSrc:  Oral Oral Oral  Resp:  20 18 18   Height:      Weight:      SpO2: 83%      Weight change: 1.1 kg (2 lb 6.8 oz)  Intake/Output Summary (Last 24 hours) at 02/22/12 1905 Last data filed at 02/22/12 1810  Gross per 24 hour  Intake 1711.67 ml  Output    975 ml  Net 736.67 ml   Labs: Basic Metabolic Panel:  Lab 02/22/12 1610 02/21/12 0425 02/20/12 0450 02/19/12 0456 02/18/12 1636 02/18/12 0500 02/17/12 0540  NA 136 137 140 137 138 134* 138  K 4.6 4.7 5.0 4.9 4.8 6.0* 4.6  CL 107 109 112 107 107 105 107  CO2 21 23 20 20 21 19 23   GLUCOSE 101* 105* 101* 85 86 99 87  BUN 27* 26* 31* 32* 30* 26* 16  CREATININE 1.53* 1.44* 1.58* 1.57* 1.56* 1.44* 1.16  ALB -- -- -- -- -- -- --  CALCIUM 8.6 8.6 8.3* 8.2* 8.2* 8.2* 8.5  PHOS -- 3.2 -- -- -- -- --   Liver Function Tests:  Lab 02/21/12 0425 02/19/12 0456 02/18/12 0500 02/17/12 0540  AST -- 108* 134* 132*  ALT -- 42 48 50  ALKPHOS -- 102 104 108  BILITOT -- 5.1* 5.6* 5.4*  PROT -- 7.1 7.2 7.0  ALBUMIN 2.7* 2.6* 2.7* --   No results found for this basename: LIPASE:3,AMYLASE:3 in the last 168 hours  Lab 02/16/12 2215  AMMONIA 83*   CBC:  Lab 02/22/12 1117 02/18/12 0520 02/17/12 0540 02/16/12 1825 02/16/12 1253  WBC 16.6* 13.8* 22.4* 23.3* --  NEUTROABS -- 5.9 10.8* -- 12.0*  HGB 5.8* 7.2* 7.7* 6.4* --  HCT 15.8* 20.4* 22.2* 18.5* --  MCV 88.8 98.1 98.2 100.5* --  PLT 226 183 284 286 --   PT/INR: @labrcntip (inr:5) Cardiac Enzymes:  Lab 02/19/12 1630 02/18/12 1636 02/18/12 1210 02/17/12 1717 02/17/12 0540 02/16/12 2215 02/16/12 1812    CKTOTAL 50 51 43 33 29 -- --  CKMB -- 1.5 1.6 1.3 1.3 1.2 --  CKMBINDEX -- -- -- -- -- -- --  TROPONINI -- -- -- -- <0.30 <0.30 <0.30   CBG: No results found for this basename: GLUCAP:5 in the last 168 hours  Iron Studies: No results found for this basename: IRON:30,TIBC:30,TRANSFERRIN:30,FERRITIN:30 in the last 168 hours  Physical Exam:  Blood pressure 119/56, pulse 77, temperature 98.2 F (36.8 C), temperature source Oral, resp. rate 18, height 5\' 10"  (1.778 m), weight 84.2 kg (185 lb 10 oz), SpO2 83.00%.  Gen: alert, AAM in no distress  Skin: no rash, cyanosis  HEENT: EOMI, sclera anicteric, throat clear  Neck: JVP about 12, no LAN, bilat abnormal sounds over L > R IJ vein, ?venous hum Chest: faint basilar rales bilat CV: regular, no rub or gallop, no murmur, no carotid bruits, pedal pulses intact  Abdomen: soft, +hepatomegaly, no ascites, nontender, +BS  Ext: no LE or UE edema, no gangrene or ulceration  Neuro: alert, Ox3, no focal deficit   UA- >  300 prot, pos nit and neg LE, large Hgb  Urine sediment (today, undersigned) > trace protein, SG 1.030, Lg blood, 1+prot, +gran casts, no rbc or wbc  Vanc- 15.1 CPK- 50 Urine prot:creat ratio > 0.84  Impression/Plan  1. AKI due to diuresis/IV NSAID's and/or hemodynamic due to CHF. Hypoxemia and cxr slightly worse; looking back all his films have some degree of perihilar/IS changes, could be pulm scarring in addition to CHF. Exam no worse today, but would resume lasix to see if diuresing helps hypoxemia. Agree with pulm evaluation. May need further cardiac eval as well if there is no pulm disease, ?high-output CHF (+venous hum bilat neck). Renal function stable with creat 1.4-1.5 range last several days. BP too low for diltiazem and B-blocker- is getting these for SVT he had on admission and maybe for diast HF, but with AKI and plan to diuresis will d/c diltiazem for now.  2. Hypoxemia- worse, suspect more than one thing contributing, ?  pulm scarring, ? CHF (restrictive, diast, and/or high-output) 3. Sickle cell disease with painful crisis- improved 4. 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

## 2012-02-22 NOTE — Plan of Care (Signed)
Problem: Discharge Progression Outcomes Goal: Barriers To Progression Addressed/Resolved Outcome: Progressing Hgb=5.8, pt getting 2 units PRBCs. Tomaz Janis, [employer]

## 2012-02-22 NOTE — Progress Notes (Signed)
CRITICAL VALUE ALERT  Critical value received:  Hgb 5.8  Date of notification:  02/22/12  Time of notification:  1342  Critical value read back:yes  Nurse who received alert:  Salomon Mast, RN  MD notified (1st page):  Dr Earlene Plater  Time of first page:  1350  MD notified (2nd page):  Time of second page:  Responding MD:  Dr Earlene Plater  Time MD responded:  1352

## 2012-02-23 DIAGNOSIS — D5700 Hb-SS disease with crisis, unspecified: Principal | ICD-10-CM

## 2012-02-23 DIAGNOSIS — D5701 Hb-SS disease with acute chest syndrome: Secondary | ICD-10-CM

## 2012-02-23 LAB — CBC
HCT: 19.6 % — ABNORMAL LOW (ref 39.0–52.0)
Hemoglobin: 7.1 g/dL — ABNORMAL LOW (ref 13.0–17.0)
MCV: 91.2 fL (ref 78.0–100.0)
Platelets: 246 10*3/uL (ref 150–400)
RBC: 2.15 MIL/uL — ABNORMAL LOW (ref 4.22–5.81)
WBC: 17.2 10*3/uL — ABNORMAL HIGH (ref 4.0–10.5)

## 2012-02-23 LAB — COMPREHENSIVE METABOLIC PANEL
AST: 115 U/L — ABNORMAL HIGH (ref 0–37)
CO2: 25 mEq/L (ref 19–32)
Chloride: 106 mEq/L (ref 96–112)
Creatinine, Ser: 1.66 mg/dL — ABNORMAL HIGH (ref 0.50–1.35)
GFR calc Af Amer: 58 mL/min — ABNORMAL LOW (ref >90)
GFR calc non Af Amer: 50 mL/min — ABNORMAL LOW (ref >90)
Glucose, Bld: 89 mg/dL (ref 70–99)
Total Bilirubin: 3.6 mg/dL — ABNORMAL HIGH (ref 0.3–1.2)

## 2012-02-23 LAB — MAGNESIUM: Magnesium: 2.1 mg/dL (ref 1.5–2.5)

## 2012-02-23 MED ORDER — FUROSEMIDE 80 MG PO TABS
80.0000 mg | ORAL_TABLET | Freq: Two times a day (BID) | ORAL | Status: DC
  Administered 2012-02-23 – 2012-02-25 (×4): 80 mg via ORAL
  Filled 2012-02-23 (×6): qty 1

## 2012-02-23 MED ORDER — METOPROLOL TARTRATE 25 MG PO TABS
25.0000 mg | ORAL_TABLET | Freq: Two times a day (BID) | ORAL | Status: DC
  Administered 2012-02-23 – 2012-02-25 (×4): 25 mg via ORAL
  Filled 2012-02-23 (×5): qty 1

## 2012-02-23 MED ORDER — DOCUSATE SODIUM 100 MG PO CAPS
100.0000 mg | ORAL_CAPSULE | Freq: Two times a day (BID) | ORAL | Status: DC
  Administered 2012-02-23 – 2012-02-27 (×3): 100 mg via ORAL
  Filled 2012-02-23 (×10): qty 1

## 2012-02-23 NOTE — Progress Notes (Signed)
Resting sat off o2 93%, ambulating sat off o2 93-95%. HR 201 after 140 ft. Returned to room, Dr Earlene Plater notified via text.

## 2012-02-23 NOTE — Progress Notes (Signed)
SICKLE CELL SERVICE PROGRESS NOTE  [name] ZOX:[id number] DOB: [birth year] DOA: 02/16/2012 PCP: August Saucer ERIC, MD  Assessment/Plan: Acute Kidney Injury: Suspect ATN associated with NSAID's and possibly vancomycin. Pt endorses that prior to hospitalization, he was taking NSAID's frequently (even though cautioned against it) for knee pain.  NSAID's discontinued.  IVF discontinued per Nephrology secondary to rales. Kidney function is increasing slowly with the diuresis. Nephrology recommendation noted.  Hyperkalemia: Resolved  Sickle cell crisis: Pt feels that his pain is under good control on oral medications.  HCAP (healthcare-associated pneumonia) vs. Pulmonary Edema vs  Acute chest syndrome(517.3): Clinically patient is not ill appearing. His CXR shows an interstitial pattern. This could represent Pneumonia vs Pulmonary edema vs. Acute Chest syndrome.  Pt on day #8/8 of Levaquin.  Patient's hypoxia has improved. His heart rate goes up into the 200's with ambulation. Oxygen level stays in the low 90s with ambulation today.  Leukocytosis: May be related to crisis, but may also be related to a pneumonia. Increased slightly post blood transfusion.   SVT (supraventricular tachycardia): Pt had an episode of SVT in the ER which was treated with Adenosine. Pt relates that he has intermittent tachycardia that is not related to pain. He denies any lightheadedness or syncope with these episodes. Patient had some abnormality on echo. Question of flailed leaflet. With the combination of SVT an echo abnormality  Cardiology was consulted. Patient is was on Cardizem and metoprolol. He was not able to tolerate both Cardizem and metoprolol with Lasix. The Cardizem was discontinued. Patient however is having elevated heart rate with ambulation. Increase his metoprolol to 25 twice a day..   Anemia: Pt s/p transfusion of 3 unit PRBC.   Code Status: Full Family Communication: N/A Disposition Plan: Home at time of  discharge.  [name] JARRETT  Pager [phone]. If 7PM-7AM, please contact night-coverage. 02/21/2012, 11:33 AM  LOS: 5 days   Brief narrative: [name] is a 42 y.o. male African American with a history of sickle cell anemia, diastolic CHF last 2-D echo 09/21/2007 with EF of 55-65%, chronic fatigue syndrome, gouty arthropathy, last hospitalization 12/07/2011 to 12/09/2011 4 sickle cell crisis who presents to the ED from his PCPs office with hypoxia with sats of 83% per ED report. Patient stated that went to see his PCP for followup appointment and he had started to develop a crisis with bilateral hip pain and knee pain 3 days prior to admission. Patient does endorse some shortness of breath on exertion when he and legs up or down stairs, subjective fevers, sneezing, bilateral hip and knee pain, constipation. Patient denies any chest pain, no nausea, no vomiting, no cough, no sore throat, no abdominal pain, no diarrhea, no dysuria, no weakness. Patient does endorse nocturia as well as congestion. Patient was seen in the ED with initial sats of 88% on room. Patient also noted to be in SVT with heart rate going up as high as 181 was given 6 mg of adenosine and would return to a sinus tachycardia with heart rate in the 90s to low 100s. CBC obtained showed a white count of 23.5 hemoglobin of 6.5. Chest x-ray was worrisome for pneumonia versus edema. One unit of packed red blood cells ordered to be transfused. Will call to admit the patient for further evaluation and management   Consultants:  None  Procedures:  None  Antibiotics:  Cefepime 1/13 >>1/16  Levaquin 1/13 >>  Vancomycin 1/13 >>1/16  HPI/Subjective: Patient feels fine. However his oxygen never still  is 83% on room air. Will recheck again tomorrow. If low then may consult pulmonary. Hemoglobin is also low. Patient may need one more unit of blood transfusion.   Objective: Filed Vitals:   02/20/12 1411 02/20/12 1603 02/20/12  2110 02/21/12 0426  BP: 127/63 116/61 113/57 117/56  Pulse: 92 87 90 78  Temp: 97.9 F (36.6 C) 99.2 F (37.3 C) 99.1 F (37.3 C) 98.3 F (36.8 C)  TempSrc: Oral Oral  Oral  Resp: 18 18 19 20   Height:      Weight:    83.1 kg (183 lb 3.2 oz)  SpO2: 90% 95% 92% 97%   Weight change: -2.6 kg (-5 lb 11.7 oz)  Intake/Output Summary (Last 24 hours) at 02/21/12 1133 Last data filed at 02/21/12 0900  Gross per 24 hour  Intake    360 ml  Output    575 ml  Net   -215 ml    General: Alert, awake, oriented x3, in no acute distress. Well appearing HEENT: [place]/AT PEERL, EOMI, Very mild icterus Heart: Regular rate and rhythm, without murmurs, rubs, gallops, PMI non-displaced, no heaves or thrills on palpation.  Lungs: Clear to auscultation, no wheezing or rhonchi noted. No rales noted Abdomen: Soft, nontender, nondistended, positive bowel sounds, no masses no hepatosplenomegaly noted. Well healed scar across abdomen.  Neuro: No focal neurological deficits noted cranial nerves II through XII grossly intact.  Strength normal in bilateral upper and lower extremities. Musculoskeletal: No warm swelling or erythema around joints, no spinal tenderness noted.     Data Reviewed: Basic Metabolic Panel:  Lab 02/21/12 1610 02/20/12 0450 02/19/12 0456 02/18/12 1636 02/18/12 0830 02/18/12 0500 02/17/12 0540 02/16/12 1825  NA 137 140 137 138 -- 134* -- --  K 4.7 5.0 4.9 4.8 -- 6.0* -- --  CL 109 112 107 107 -- 105 -- --  CO2 23 20 20 21  -- 19 -- --  GLUCOSE 105* 101* 85 86 -- 99 -- --  BUN 26* 31* 32* 30* -- 26* -- --  CREATININE 1.44* 1.58* 1.57* 1.56* -- 1.44* -- --  CALCIUM 8.6 8.3* 8.2* 8.2* -- 8.2* -- --  MG -- -- -- -- 2.5 -- 2.5 1.8  PHOS 3.2 -- -- -- -- -- -- --   Liver Function Tests:  Lab 02/21/12 0425 02/19/12 0456 02/18/12 0500 02/17/12 0540 02/16/12 1253  AST -- 108* 134* 132* 152*  ALT -- 42 48 50 51  ALKPHOS -- 102 104 108 116  BILITOT -- 5.1* 5.6* 5.4* 9.2*  PROT -- 7.1 7.2  7.0 7.3  ALBUMIN 2.7* 2.6* 2.7* 2.5* 2.8*    Lab 02/16/12 2215  AMMONIA 83*   CBC:  Lab 02/18/12 0520 02/17/12 0540 02/16/12 1825 02/16/12 1253  WBC 13.8* 22.4* 23.3* 23.5*  NEUTROABS 5.9 10.8* -- 12.0*  HGB 7.2* 7.7* 6.4* 6.5*  HCT 20.4* 22.2* 18.5* 18.2*  MCV 98.1 98.2 100.5* 98.9  PLT 183 284 286 268   Cardiac Enzymes:  Lab 02/19/12 1630 02/18/12 1636 02/18/12 1210 02/17/12 1717 02/17/12 0540 02/16/12 2215 02/16/12 1812  CKTOTAL 50 51 43 33 29 -- --  CKMB -- 1.5 1.6 1.3 1.3 1.2 --  CKMBINDEX -- -- -- -- -- -- --  TROPONINI -- -- -- -- <0.30 <0.30 <0.30   BNP (last 3 results)  Basename 02/16/12 1511 12/09/11 0755 08/14/11 0722  PROBNP 742.6* 102.5 180.9*    Recent Results (from the past 240 hour(s))  CULTURE, BLOOD (ROUTINE X 2)  Status: Normal (Preliminary result)   Collection Time   02/16/12  6:05 PM      Component Value Range Status Comment   Specimen Description BLOOD RIGHT HAND   Final    Special Requests NONE BOTTLES DRAWN AEROBIC AND ANAEROBIC 5CC   Final    Culture  Setup Time 02/16/2012 22:22   Final    Culture     Final    Value:        BLOOD CULTURE RECEIVED NO GROWTH TO DATE CULTURE WILL BE HELD FOR 5 DAYS BEFORE ISSUING A FINAL NEGATIVE REPORT   Report Status PENDING   Incomplete   CULTURE, BLOOD (ROUTINE X 2)     Status: Normal (Preliminary result)   Collection Time   02/16/12  6:12 PM      Component Value Range Status Comment   Specimen Description BLOOD RIGHT HAND   Final    Special Requests NONE BOTTLES DRAWN AEROBIC AND ANAEROBIC 5CC   Final    Culture  Setup Time 02/16/2012 22:22   Final    Culture     Final    Value:        BLOOD CULTURE RECEIVED NO GROWTH TO DATE CULTURE WILL BE HELD FOR 5 DAYS BEFORE ISSUING A FINAL NEGATIVE REPORT   Report Status PENDING   Incomplete   URINE CULTURE     Status: Normal   Collection Time   02/16/12  6:44 PM      Component Value Range Status Comment   Specimen Description URINE, CLEAN CATCH   Final     Special Requests NONE   Final    Culture  Setup Time 02/16/2012 22:26   Final    Colony Count NO GROWTH   Final    Culture NO GROWTH   Final    Report Status 02/17/2012 FINAL   Final   MRSA PCR SCREENING     Status: Abnormal   Collection Time   02/16/12  9:10 PM      Component Value Range Status Comment   MRSA by PCR POSITIVE (*) NEGATIVE Final      Studies: Dg Chest 2 View  02/16/2012  *RADIOLOGY REPORT*  Clinical Data: Sickle cell crisis with shortness of breath and body aches.  Low O2 sats.  CHEST - 2 VIEW  Comparison: 12/09/2011.  Findings: Trachea is midline.  Heart is mildly enlarged.  There is mild diffuse bilateral air space disease.  No pleural fluid.  IMPRESSION: Mild diffuse bilateral air space disease which may be due to edema or pneumonia.   Original Report Authenticated By: Leanna Battles, M.D.     Scheduled Meds:    . Chlorhexidine Gluconate Cloth  6 each Topical [id number]  . enoxaparin (LOVENOX) injection  40 mg Subcutaneous Q24H  . folic acid  1 mg Oral Daily  . HYDROcodone-acetaminophen  1 tablet Oral Q4H  . levofloxacin (LEVAQUIN) IV  750 mg Intravenous Q24H  . methadone  10 mg Oral TID  . metoprolol tartrate  12.5 mg Oral BID  . mupirocin ointment  1 application Nasal BID  . pantoprazole  40 mg Oral Q0600   Continuous Infusions:    Active Problems:  Gout  Chronic pain  Leukocytosis  Acute chest syndrome(517.3)  Hyperkalemia  Sickle cell crisis  Acute respiratory distress  HCAP (healthcare-associated pneumonia)  Hypoxia  SVT (supraventricular tachycardia)  Anemia  Jaundice  Acute renal failure

## 2012-02-23 NOTE — Progress Notes (Signed)
  Pharmacy Note (Brief) - Levaquin  HPI: 39 YOM admitted for hypoxia and [place] crisis. Concern for HCAP with initiation of broad spectrum antibiotics.   PMH: SS, DHF (EF 55-65% in '09), chronic fatigue, gout  1/13 >> cefepime >> 1/16 1/13 >> vancomycin >> 1/16 1/13 >> levofloxacin >>   Tmax: afeb WBCs: elevated Renal: Trending Up, CrCl 61 ml/min  1/13 blood: NG (f) 1/13 urine: NGF 1/13 mrsa pcr >> positive 1/14 strep pneumo/legionella ag >> negative 1/13 influenza panel >> negative  Assessment: Today is D#8 Levaquin 750mg  IV q 24h for PNA. Dose OK. Plan 8 days of therapy per MD notes. (will complete 8 days after 23:00 dose tonight).  Plan: 1) No changes to current abx 2) Suggest d/c'ing Levaquin after 23:00 dose tonight.  Darrol Angel, PharmD Pager: [phone] 02/23/2012 10:36 AM

## 2012-02-23 NOTE — Progress Notes (Signed)
Subjective:  No c/o SOB today.  Was transfused yesterday x 1 off telemtry for unclear reasons.  Says not having tachycardia but nurse reports heart rate increases noted on pulse ox when ambulating.  Objective:  Vital Signs in the last 24 hours: BP 114/59  Pulse 72  Temp 99.1 F (37.3 C) (Oral)  Resp 18  Ht 5\' 10"  (1.778 m)  Wt 83.5 kg (184 lb 1.4 oz)  BMI 26.41 kg/m2  SpO2 95%  Physical Exam: Pleasant BM in NAD Lungs:  Mils rales Cardiac:  Regular rhythm, normal S1 and S2, no S3 2/6 systolic murmur, dynamic precordium Abdomen:  Soft, nontender, no masses Extremities:  No edema present  Intake/Output from previous day: 01/19 0701 - 01/20 0700 In: 3074.8 [P.O.:1660; I.V.:250; Blood:318.8; IV Piggyback:606] Out: 2175 [Urine:2175] Weight Filed Weights   02/21/12 0426 02/22/12 0507 02/23/12 0443  Weight: 83.1 kg (183 lb 3.2 oz) 84.2 kg (185 lb 10 oz) 83.5 kg (184 lb 1.4 oz)    Lab Results: Basic Metabolic Panel:  Basename 02/23/12 0340 02/22/12 0345  NA 139 136  K 4.5 4.6  CL 106 107  CO2 25 21  GLUCOSE 89 101*  BUN 25* 27*  CREATININE 1.66* 1.53*    CBC:  Basename 02/23/12 0340 02/22/12 1117  WBC 17.2* 16.6*  NEUTROABS -- --  HGB 7.1* 5.8*  HCT 19.6* 15.8*  MCV 91.2 88.8  PLT 246 226    BNP    Component Value Date/Time   PROBNP 1674.0* 02/22/2012 0345    PROTIME: Lab Results  Component Value Date   INR 1.41 02/17/2012    Telemetry: Patient is not on telemerty  Assessment/Plan: 1. Mild mitral regurgitation 2. Pulmonary edema maybe from high output CHF, anemia, and vol overlad 3. Sickle crisis 4. History of SVT 5. Acute renal failure  Rec:  While he is in I'd like him to be on telemetry. Continue beta blocker.  Watch renal.  .       [name] Palmer  MD [facility]  02/23/2012, 4:44 PM

## 2012-02-23 NOTE — Progress Notes (Signed)
Subjective:  Looks and feels much better! Walked the halls - no oxygen - states pulse ox stayed in the 90's  Has been using the spirometer Good UOP Lost close to a kg with IV lasix  Objective:     Vital signs in last 24 hours: Filed Vitals:   02/22/12 1935 02/22/12 2114 02/23/12 0443 02/23/12 0617  BP: 115/53 125/61 128/76   Pulse: 73 78 75   Temp: 98.1 F (36.7 C) 98.5 F (36.9 C) 97.9 F (36.6 C)   TempSrc: Oral Oral Oral   Resp: 16 18 18    Height:      Weight:   83.5 kg (184 lb 1.4 oz)   SpO2: 99% 97% 97% 94%   Weight change: -0.7 kg (-1 lb 8.7 oz)  Intake/Output Summary (Last 24 hours) at 02/23/12 1408 Last data filed at 02/23/12 0756  Gross per 24 hour  Intake 2114.75 ml  Output   1900 ml  Net 214.75 ml    Physical Exam:  Blood pressure 128/76, pulse 75, temperature 97.9 F (36.6 C), temperature source Oral, resp. rate 18, height 5\' 10"  (1.778 m), weight 83.5 kg (184 lb 1.4 oz), SpO2 94.00%. 02/23/12 0443 83.5 kg  02/22/12 0507 84.2 kg   Gen: alert, AAM in no distress  Skin: no rash, cyanosis  HEENT: EOMI, sclera anicteric, throat clear  Chest: faint basilar rales bilat - sound dry almost velcro like  CV: regular, no rub or gallop, no murmur, no carotid bruits, pedal pulses intact  Abdomen: soft, +hepatomegaly, no ascites, nontender, +BS  Ext: no LE or UE edema (perhaps trace pretibial and trace sacral at most), no gangrene or ulceration  Neuro: alert, Ox3, no focal deficit   Labs:   Lab 02/23/12 0340 02/22/12 0345 02/21/12 0425 02/20/12 0450 02/19/12 0456 02/18/12 1636 02/18/12 0500  NA 139 136 137 140 137 138 134*  K 4.5 4.6 4.7 5.0 4.9 4.8 6.0*  CL 106 107 109 112 107 107 105  CO2 25 21 23 20 20 21 19   GLUCOSE 89 101* 105* 101* 85 86 99  BUN 25* 27* 26* 31* 32* 30* 26*  CREATININE 1.66* 1.53* 1.44* 1.58* 1.57* 1.56* 1.44*  ALB -- -- -- -- -- -- --  CALCIUM 8.7 8.6 8.6 8.3* 8.2* 8.2* 8.2*  PHOS -- -- 3.2 -- -- -- --     Lab 02/23/12 0340  02/21/12 0425 02/19/12 0456 02/18/12 0500  AST 115* -- 108* 134*  ALT 34 -- 42 48  ALKPHOS 96 -- 102 104  BILITOT 3.6* -- 5.1* 5.6*  PROT 7.4 -- 7.1 7.2  ALBUMIN 2.8* 2.7* 2.6* --   No results found for this basename: LIPASE:3,AMYLASE:3 in the last 168 hours  Lab 02/16/12 2215  AMMONIA 83*     Lab 02/23/12 0340 02/22/12 1117 02/18/12 0520 02/17/12 0540  WBC 17.2* 16.6* 13.8* 22.4*  NEUTROABS -- -- 5.9 10.8*  HGB 7.1* 5.8* 7.2* 7.7*  HCT 19.6* 15.8* 20.4* 22.2*  MCV 91.2 88.8 98.1 98.2  PLT 246 226 183 284     Lab 02/19/12 1630 02/18/12 1636 02/18/12 1210 02/17/12 1717 02/17/12 0540 02/16/12 2215 02/16/12 1812  CKTOTAL 50 51 43 33 29 -- --  CKMB -- 1.5 1.6 1.3 1.3 1.2 --  CKMBINDEX -- -- -- -- -- -- --  TROPONINI -- -- -- -- <0.30 <0.30 <0.30   UA- >300 prot, pos nit and neg LE, large Hgb  Urine sediment (today, undersigned) > trace protein, SG 1.030,  Lg blood, 1+prot, +gran casts, no rbc or wbc  Vanc- 15.1 >10.2 CPK- 50  Urine prot:creat ratio > 0.84  Studies/Results: Dg Chest 2 View  02/22/2012  *RADIOLOGY REPORT*  Clinical Data: Hypoxia and bodyaches.  CHEST - 2 VIEW  Comparison: 02/20/2012  Findings: Right-sided PICC line unchanged tip at low SVC. Midline trachea.  Moderate cardiomegaly.  Moderate interstitial edema, minimally progressive.  Small right pleural effusion is unchanged. No pneumothorax.  IMPRESSION: Minimal progression of moderate congestive heart failure.  Similar small right pleural effusion.   Original Report Authenticated By: Jeronimo Greaves, M.D.    No new CXR data today      . docusate sodium  100 mg Oral BID  . enoxaparin (LOVENOX) injection  40 mg Subcutaneous Q24H  . folic acid  1 mg Oral Daily  . furosemide  60 mg Intravenous Q12H  . HYDROcodone-acetaminophen  1 tablet Oral Q4H  . levofloxacin (LEVAQUIN) IV  750 mg Intravenous Q24H  . methadone  10 mg Oral TID  . metoprolol tartrate  12.5 mg Oral BID  . pantoprazole  40 mg Oral Q0600     I   have reviewed scheduled and prn medications.  ASSESSMENT/RECOMMENDATIONS  1. AKI due to diuresis/IV NSAID's and/or hemodynamic due to CHF. Cannot r/o papillary necrosis/intramedullary sickling.  Hypoxemia and cxr slightly worse yesterday, but today sats look better today; looking back all his films have some degree of perihilar/IS changes, could be pulm scarring in (addition to mild)CHF.  Lasix was resumed yesterday and lost close to a kg; feel could go to po lasix today (80 BID)and see if can maintain.  Renal function still with little creep upward in creatinine over past 24 hours.  Dilt d/c'd d/t lowish BP yesterday - today BP's a little better 2. Hypoxemia- Suspect more than one thing contributing, ? pulm scarring, ? CHF (restrictive, diast, and/or high-output)  Sats better today (using spirometer, some diuresis achieved).  Pt says pulm will be evaluating today. 3. Sickle cell disease with painful crisis- improved 4. 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   Camille Bal, MD [facility] [phone] Pager 02/23/2012, 2:08 PM

## 2012-02-23 NOTE — Consult Note (Signed)
PULMONARY  / CRITICAL CARE MEDICINE  Name: [name] MRN: [id number] DOB: [birth year]    LOS: 7  REFERRING MD :  Dr Molli Posey  CHIEF COMPLAINT:  dyspnea  BRIEF PATIENT DESCRIPTION: 41 yobm never smoker with SS freq exac with worsening doe between episodes admit 1/13 with typical exac assoc with hypoxemia and pccm asked to see 1/20 for eval of possible acute chest syndrome.        HISTORY OF PRESENT ILLNESS:  Pt reports had typical symptoms of   crisis with bilateral hip pain and knee pain 3 days prior to admission. Patient does endorse some shortness of breath on exertion when he and legs up or down stairs, subjective fevers, sneezing, bilateral hip and knee pain, constipation. No cough  Sleeping ok without nocturnal  or early am exacerbation  of respiratory  c/o's or need for noct saba. Also denies any obvious fluctuation of symptoms with weather or environmental changes or other aggravating or alleviating factors except as outlined above   ROS  The following are not active complaints unless bolded sore throat, dysphagia, dental problems, itching, sneezing,  nasal congestion or excess/ purulent secretions, ear ache,   fever, chills, sweats, unintended wt loss, pleuritic or exertional cp, hemoptysis,  orthopnea pnd or leg swelling, presyncope, palpitations, heartburn, abdominal pain, anorexia, nausea, vomiting, diarrhea  or change in bowel or urinary habits, change in stools or urine, dysuria,hematuria,  rash, arthralgias, visual complaints, headache, numbness weakness or ataxia or problems with walking or coordination,  change in mood/affect or memory.        PAST MEDICAL HISTORY :  Past Medical History  Diagnosis Date  . CHF (congestive heart failure)   . Gouty arthropathy   . Chronic fatigue syndrome   . Sickle cell anemia    Past Surgical History  Procedure Date  . Appendectomy   . Cholecystectomy   . Splenectomy    Prior to Admission medications   Medication Sig  Start Date End Date Taking? Authorizing Provider  ibuprofen (ADVIL,MOTRIN) 200 MG tablet Take 400-800 mg by mouth every 6 (six) hours as needed. For pain   Yes Historical Provider, MD  methadone (DOLOPHINE) 10 MG tablet Take 1 tablet (10 mg total) by mouth 3 (three) times daily. 08/14/11  Yes Grayce Sessions, NP  sodium chloride (OCEAN) 0.65 % nasal spray Place 1 spray into the nose as needed. For nasal congestion   Yes Historical Provider, MD  zolpidem (AMBIEN) 10 MG tablet Take 10 mg by mouth at bedtime as needed. For sleep.   Yes Historical Provider, MD   No Known Allergies  FAMILY HISTORY:  Family History  Problem Relation Age of Onset  . Sickle cell anemia    . Sickle cell trait Mother    SOCIAL HISTORY:  reports that he has never smoked. He does not have any smokeless tobacco history on file. He reports that he does not drink alcohol or use illicit drugs.     VITAL SIGNS: Temp:  [97.9 F (36.6 C)-99.1 F (37.3 C)] 99.1 F (37.3 C) (01/20 1430) Pulse Rate:  [72-78] 72  (01/20 1430) Resp:  [18] 18  (01/20 1430) BP: (114-128)/(59-76) 114/59 mmHg (01/20 1430) SpO2:  [94 %-97 %] 95 % (01/20 1430) Weight:  [184 lb 1.4 oz (83.5 kg)] 184 lb 1.4 oz (83.5 kg) (01/20 0443) FIO2  2lpm taperd to RA     INTAKE / OUTPUT: Intake/Output      01/20 0701 - 01/21 0700  P.O. 600   I.V. (mL/kg)    Blood    Other    IV Piggyback    Total Intake(mL/kg) 600 (7.2)   Urine (mL/kg/hr) 2925 (2.5)   Total Output 2925   Net -2325         PHYSICAL EXAMINATION: General:  Pleasant non toxic bm nad HEENT: nl dentition, turbinates, and orophanx. Nl external ear canals without cough reflex   NECK :  without JVD/Nodes/TM/ nl carotid upstrokes bilaterally   LUNGS: no acc muscle use, clear to A and P bilaterally without cough on insp or exp maneuvers   CV:  RRR  no s3 or murmur or increase in P2, no edema   ABD:  soft and nontender with nl excursion in the supine position. No bruits or  organomegaly, bowel sounds nl  MS:  warm without deformities, calf tenderness, cyanosis or clubbing  SKIN: warm and dry without lesions    NEURO:  alert, approp, no deficits     LABS: Cbc  Lab 02/23/12 0340 02/22/12 1117 02/18/12 0520  WBC 17.2* -- --  HGB 7.1* 5.8* 7.2*  HCT 19.6* 15.8* 20.4*  PLT 246 226 183    Chemistry   Lab 02/23/12 0340 02/22/12 0345 02/21/12 0425 02/18/12 0830 02/17/12 0540  NA 139 136 137 -- --  K 4.5 4.6 4.7 -- --  CL 106 107 109 -- --  CO2 25 21 23  -- --  BUN 25* 27* 26* -- --  CREATININE 1.66* 1.53* 1.44* -- --  CALCIUM 8.7 8.6 8.6 -- --  MG 2.1 -- -- 2.5 2.5  PHOS -- -- 3.2 -- --  GLUCOSE 89 101* 105* -- --    Liver fxn  Lab 02/23/12 0340 02/21/12 0425 02/19/12 0456 02/18/12 0500  AST 115* -- 108* 134*  ALT 34 -- 42 48  ALKPHOS 96 -- 102 104  BILITOT 3.6* -- 5.1* 5.6*  PROT 7.4 -- 7.1 7.2  ALBUMIN 2.8* 2.7* 2.6* --   coags  Lab 02/17/12 0540  APTT 47*  INR 1.41   Sepsis markers  Lab 02/19/12 0456 02/18/12 0520 02/17/12 0540  LATICACIDVEN -- -- --  PROCALCITON 1.93 0.78 1.10   Cardiac markers  Lab 02/19/12 1630 02/18/12 1636 02/18/12 1210 02/17/12 1717 02/17/12 0540 02/16/12 2215  CKTOTAL 50 51 43 -- -- --  CKMB -- 1.5 1.6 1.3 -- --  TROPONINI -- -- -- -- <0.30 <0.30   BNP  Lab 02/22/12 0345  PROBNP 1674.0*   ABG No results found for this basename: PHART:3,PCO2ART:3,PO2ART:3,HCO3:3,TCO2:3 in the last 168 hours  CBG trend No results found for this basename: GLUCAP:5 in the last 168 hours     DIAGNOSES: Active Problems:  Gout  Chronic pain  Leukocytosis  Acute chest syndrome(517.3)  Hyperkalemia  Sickle cell crisis  Acute respiratory distress  HCAP (healthcare-associated pneumonia)  Hypoxia  SVT (supraventricular tachycardia)  Anemia  Jaundice  Acute renal failure   ASSESSMENT / PLAN:  PULMONARY  ASSESSMENT: Acute resp syndrome in pt with active sickle cell syndrome c/w ALI with ? Underlying  ILD vs diastolic chf (eval by Donnie Aho this admit) > will need f/u as outpt but nothing to add at this point to his inpt care  PLAN:    F/u w/in 2 weeks as outpt (he has my card)     Pulmonary and Critical Care Medicine [facility] Pager: [phone]  02/23/2012, 8:56 PM

## 2012-02-24 LAB — HEMOGLOBIN FREE, PLASMA: Hgb, Plasma: 18.8 mg/dL — ABNORMAL HIGH (ref 0.0–6.9)

## 2012-02-24 LAB — RENAL FUNCTION PANEL
Albumin: 2.8 g/dL — ABNORMAL LOW (ref 3.5–5.2)
BUN: 28 mg/dL — ABNORMAL HIGH (ref 6–23)
Creatinine, Ser: 1.49 mg/dL — ABNORMAL HIGH (ref 0.50–1.35)
Phosphorus: 5 mg/dL — ABNORMAL HIGH (ref 2.3–4.6)
Potassium: 4.5 mEq/L (ref 3.5–5.1)

## 2012-02-24 LAB — CBC
HCT: 18.2 % — ABNORMAL LOW (ref 39.0–52.0)
MCHC: 36.8 g/dL — ABNORMAL HIGH (ref 30.0–36.0)
MCV: 91 fL (ref 78.0–100.0)
RDW: 26.9 % — ABNORMAL HIGH (ref 11.5–15.5)

## 2012-02-24 MED ORDER — METOPROLOL TARTRATE 1 MG/ML IV SOLN
INTRAVENOUS | Status: AC
  Administered 2012-02-24: 2.5 mg
  Filled 2012-02-24: qty 5

## 2012-02-24 MED ORDER — METOPROLOL TARTRATE 1 MG/ML IV SOLN
2.5000 mg | Freq: Once | INTRAVENOUS | Status: AC
  Administered 2012-02-24: 2.5 mg via INTRAVENOUS

## 2012-02-24 NOTE — Progress Notes (Signed)
Discussed in long length of stay meeting. 

## 2012-02-24 NOTE — Progress Notes (Signed)
Approx an hour after pt amb in halls his HR went up to 140s, sustained, BP 127/67, pt stated he could tell it had sped up but asymptomatic as far as CP, dizziness etc.  Change of shift and unable to get hospitalist, so I called cardiology.  Dr Mayford Knife aware of situation, see new order, IV lopressor given. Will continue to monitor rate.

## 2012-02-24 NOTE — Progress Notes (Signed)
Pt has ambulated in halls, HR remained 88-95, no dyspnea noted but sats were 89-90%

## 2012-02-24 NOTE — Progress Notes (Signed)
CRITICAL VALUE ALERT  Critical value received:  Hemoglobin   Date of notification:  02/24/2012  Time of notification:  0528  Critical value read back:yes  Nurse who received alert:  Julious Oka, RN  MD notified (1st page):  NP, Kirtland Bouchard Schorr  Time of first page:  [phone]  MD notified (2nd page):  Time of second page:  Responding MD:  NP, Merdis Delay  Time MD responded:  [id number]  NP wants to allow rounding MDs to decide whether or not to transfuse any blood today. Patient's hemoglobin is borderline. Patient is eager to go home.

## 2012-02-24 NOTE — Progress Notes (Signed)
Subjective: Feels better Getting up to walk the halls Not dyspneic Good UOP on po lasix  Objective:     Vital signs in last 24 hours: Filed Vitals:   02/23/12 2100 02/24/12 0420 02/24/12 0422 02/24/12 0531  BP: 125/58   122/66  Pulse: 74   77  Temp: 98.9 F (37.2 C)   98.4 F (36.9 C)  TempSrc: Oral   Oral  Resp: 18   18  Height:      Weight:  81.421 kg (179 lb 8 oz) 81.421 kg (179 lb 8 oz)   SpO2: 94%   94%   Weight change: -2.079 kg (-4 lb 9.4 oz)  Intake/Output Summary (Last 24 hours) at 02/24/12 1216 Last data filed at 02/24/12 1032  Gross per 24 hour  Intake   1460 ml  Output   3076 ml  Net  -1616 ml    Physical Exam:  Blood pressure 122/66, pulse 77, temperature 98.4 F (36.9 C), temperature source Oral, resp. rate 18, height 5\' 10"  (1.778 m), weight 81.421 kg (179 lb 8 oz), SpO2 94.00%.  02/24/12 0422 81.421 kg  02/23/12 0443 83.5 kg  02/22/12 0507 84.2 kg  Gen: alert, AAM in no distress  Skin: no rash, cyanosis   Chest: faint basilar rales bilat - sound dry almost velcro like  CV: regular, no rub or gallop, no murmur, no carotid bruits, pedal pulses intact  Abdomen: soft, +hepatomegaly, no ascites, nontender, +BS  Ext: Trace pretibial and trace sacral at most, no gangrene or ulceration   Labs:   Lab 02/24/12 0500 02/23/12 0340 02/22/12 0345 02/21/12 0425 02/20/12 0450 02/19/12 0456 02/18/12 1636  NA 137 139 136 137 140 137 138  K 4.5 4.5 4.6 4.7 5.0 4.9 4.8  CL 104 106 107 109 112 107 107  CO2 25 25 21 23 20 20 21   GLUCOSE 98 89 101* 105* 101* 85 86  BUN 28* 25* 27* 26* 31* 32* 30*  CREATININE 1.49* 1.66* 1.53* 1.44* 1.58* 1.57* 1.56*  ALB -- -- -- -- -- -- --  CALCIUM 8.8 8.7 8.6 8.6 8.3* 8.2* 8.2*  PHOS 5.0* -- -- 3.2 -- -- --     Lab 02/24/12 0500 02/23/12 0340 02/21/12 0425 02/19/12 0456 02/18/12 0500  AST -- 115* -- 108* 134*  ALT -- 34 -- 42 48  ALKPHOS -- 96 -- 102 104  BILITOT -- 3.6* -- 5.1* 5.6*  PROT -- 7.4 -- 7.1 7.2  ALBUMIN  2.8* 2.8* 2.7* -- --   No results found for this basename: LIPASE:3,AMYLASE:3 in the last 168 hours No results found for this basename: AMMONIA:3 in the last 168 hours   Lab 02/24/12 0500 02/23/12 0340 02/22/12 1117 02/18/12 0520  WBC 16.0* 17.2* 16.6* 13.8*  NEUTROABS -- -- -- 5.9  HGB 6.7* 7.1* 5.8* 7.2*  HCT 18.2* 19.6* 15.8* 20.4*  MCV 91.0 91.2 88.8 98.1  PLT 244 246 226 183    Lab 02/19/12 1630 02/18/12 1636 02/18/12 1210 02/17/12 1717  CKTOTAL 50 51 43 33  CKMB -- 1.5 1.6 1.3  CKMBINDEX -- -- -- --  TROPONINI -- -- -- --    No results found for this basename: GLUCAP:5 in the last 168 hours   No results found for this basename: IRON:30,TIBC:30,TRANSFERRIN:30,FERRITIN:30 in the last 168 hours  Studies/Results: Dg Chest 2 View  02/22/2012  *RADIOLOGY REPORT*  Clinical Data: Hypoxia and bodyaches.  CHEST - 2 VIEW  Comparison: 02/20/2012  Findings: Right-sided PICC line unchanged  tip at low SVC. Midline trachea.  Moderate cardiomegaly.  Moderate interstitial edema, minimally progressive.  Small right pleural effusion is unchanged. No pneumothorax.  IMPRESSION: Minimal progression of moderate congestive heart failure.  Similar small right pleural effusion.   Original Report Authenticated By: Jeronimo Greaves, M.D.          . docusate sodium  100 mg Oral BID  . enoxaparin (LOVENOX) injection  40 mg Subcutaneous Q24H  . folic acid  1 mg Oral Daily  . furosemide  80 mg Oral BID  . HYDROcodone-acetaminophen  1 tablet Oral Q4H  . levofloxacin (LEVAQUIN) IV  750 mg Intravenous Q24H  . methadone  10 mg Oral TID  . metoprolol tartrate  25 mg Oral BID  . pantoprazole  40 mg Oral Q0600     I  have reviewed scheduled and prn medications.  ASSESSMENT/RECOMMENDATIONS  1. AKI due to (initial) diuresis/IV NSAID's and/or hemodynamic due to CHF. Cannot r/o papillary necrosis/intramedullary sickling. Hypoxemia and cxr slightly worse 1/19, but  sats have improved; looking back all his films  have some degree of perihilar/IS changes, could be pulm scarring in (addition to mild) CHF. Lasix resumed 1/19, changed to po 80 BID 1/20 with good diuresis and some improvement in renal function past 24 hours. Continue current dosing.  Check renal function in the am.   2. Hypoxemia- Suspect more than one thing contributing, ? pulm scarring, ? CHF (restrictive, diast, and/or high-output) Sats better today (using spirometer, some diuresis achieved). Pt says pulm will be evaluating today. 3. Sickle cell disease with painful crisis- improved 4. 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 5. If Hb stable and renal continues to improve, ? Home soon?   Camille Bal, MD  [facility] [phone] Pager 02/24/2012, 12:16 PM

## 2012-02-24 NOTE — Progress Notes (Signed)
IV lopressor effective, HR 70-80 now

## 2012-02-24 NOTE — Progress Notes (Signed)
SICKLE CELL SERVICE PROGRESS NOTE  [name] NWG:[id number] DOB: 11/09/1970 DOA: 02/16/2012 PCP: Willey Blade, MD  Assessment/Plan: Active Problems:  Acute renal failure: The problem that has prolonged hospitalization has been the acute kidney injury. It was felt to be ATN secondary to multiple factors. Renal U/S showed findings consistent with medical renal disease but no hydronephrosis.  I have spoken with Dr. Eliott Nine and she feels that patient will be ready for discharge home likely tomorrow a long as renal function continues to improve. Today was the first improvement seen in renal function. Will reassess in the morning. Hopefully D/C home soon.   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 He offers that whl=ile in college he was required to use oxygen on a continuous basis but is unsure why. Nevertheless, hypoxia is resolved at this time and patient is to follow up as an outpatient with pulmonary. He will need PFT's.  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 Spectrum was narrowed to Levaquin and patient completed a total of 8 days of antibiotics.  Mitral Regurgitation: ECHO showed mitral regurgitation with some valve abnormality. HE was evaluated by cardiology who have recommended follow-up echo in the future.   SVT (supraventricular tachycardia): On presentation, pt had HR of 150-190 and required adenosine conversion in the ER. He continued to have intermittent sinus tachycardia and was started on Metoprolol which as been titrated to 25 mg BID. His HR is now controlled on 25 mg BID. Pt has been educated about not discontinuing the metoprolol as he did in the past.   Leukocytosis: Pt has continued to have a persistent leukocytosis without a reasonable explanation. I do not believe that the etiology is infectious. Blood cultures and urine cultures are negative.Question if patient has a myelodysplastic process occurring. Will need to  have WBC re-evaluated during the convalescent state.   Sickle cell crisis: resolved. Pt at baseline of pain and requiring only oral medications.   Acute chest syndrome(517.3): PT was treated for acute chest syndrome on admission, however given clinical course, it is likely that his clinical presentation was all secondary to pulmonary edema secondary to mitral regurgitation and SVT.   Hyperkalemia: Pt was treated with Kayexalate and has had a resolution. It was felt to be secondary to acute kidney injury.   Anemia: Pt's baseline Hb is 6.0. However, due to the initial presentation and hypoxemia, pt was transfused 2 Units of PRBC's to improve oxygen carrying capacity.  He is at his basline of 6.0.  Code Status: Full Code Family Communication: N/A Disposition Plan: Home in 24-48 hours  [name] A.  Pager [phone]. If 7PM-7AM, please contact night-coverage.  02/24/2012, 5:36 PM  LOS: 8 days   Brief narrative: Pt is a 42 y.o. male African American with a history of sickle cell anemia, sinus tachycardia, chronic fatigue syndrome, gouty arthropathy, last hospitalization 12/07/2011 to 12/09/2011 4 sickle cell crisis who presents to the ED from his PCPs office with hypoxia with sats of 83% per ED report. Patient stated that went to see his PCP for followup appointment and he had started to develop a crisis with bilateral hip pain and knee pain 3 days prior to admission. Patient does endorse some shortness of breath on exertion when he and legs up or down stairs, subjective fevers, sneezing, bilateral hip and knee pain, constipation. Patient denies any chest pain, no nausea, no vomiting, no cough, no sore throat, no abdominal pain, no diarrhea, no dysuria,  no weakness. Patient does endorse nocturia as well as congestion. Patient was seen in the ED with initial sats of 88% on room. Patient also noted to be in SVT with heart rate going up as high as 181 was given 6 mg of adenosine and would return to  a sinus tachycardia with heart rate in the 90s to low 100s. CBC obtained showed a white count of 23.5 hemoglobin of 6.5. Chest x-ray was worrisome for pneumonia versus edema. One unit of packed red blood cells ordered to be transfused. Will call to admit the patient for further evaluation and management.   Consultants:  Nephrology  Cardiology  Pulmonology  Procedures:  PICC line placement 1/17 2014  Antibiotics:  Vancomycin 1/13 >>1/16  Zosyn 1/13 >>1/16  Levaquin 1/13 >> 1/21  HPI/Subjective: Pt states that he feels well and is anticipating being  Discharged home soon.  Objective: Filed Vitals:   02/24/12 0420 02/24/12 0422 02/24/12 0531 02/24/12 1531  BP:   122/66 113/57  Pulse:   77 79  Temp:   98.4 F (36.9 C) 97.8 F (36.6 C)  TempSrc:   Oral Oral  Resp:   18   Height:      Weight: 81.421 kg (179 lb 8 oz) 81.421 kg (179 lb 8 oz)    SpO2:   94% 94%   Weight change: -2.079 kg (-4 lb 9.4 oz)  Intake/Output Summary (Last 24 hours) at 02/24/12 1736 Last data filed at 02/24/12 1700  Gross per 24 hour  Intake   1580 ml  Output   2651 ml  Net  -1071 ml    General: Alert, awake, oriented x3, in no acute distress. Well appearing HEENT: [place]/AT PEERL, EOMI, Anicteric Neck: Trachea midline,  no masses, no thyromegal,y no JVD, no carotid bruit OROPHARYNX:  Moist, No exudate/ erythema/lesions.  Heart: Regular rate and rhythm, without murmurs, rubs, gallops.  Lungs: Crackles noted throughout lung fields. No wheezing no rales.  Abdomen: Soft, nontender, nondistended, positive bowel sounds, no masses no hepatosplenomegaly noted..  Neuro: No focal neurological deficits noted cranial nerves II through XII grossly intact. DTRs 2+ bilaterally upper and lower extremities. Strength normal in bilateral upper and lower extremities. Musculoskeletal: No warm swelling or erythema around joints, no spinal tenderness noted. Psychiatric: Patient alert and oriented x3, good insight and  cognition, good recent to remote recall.  Data Reviewed: Basic Metabolic Panel:  Lab 02/24/12 5409 02/23/12 0340 02/22/12 0345 02/21/12 0425 02/20/12 0450 02/18/12 0830  NA 137 139 136 137 140 --  K 4.5 4.5 4.6 4.7 5.0 --  CL 104 106 107 109 112 --  CO2 25 25 21 23 20  --  GLUCOSE 98 89 101* 105* 101* --  BUN 28* 25* 27* 26* 31* --  CREATININE 1.49* 1.66* 1.53* 1.44* 1.58* --  CALCIUM 8.8 8.7 8.6 8.6 8.3* --  MG -- 2.1 -- -- -- 2.5  PHOS 5.0* -- -- 3.2 -- --   Liver Function Tests:  Lab 02/24/12 0500 02/23/12 0340 02/21/12 0425 02/19/12 0456 02/18/12 0500  AST -- 115* -- 108* 134*  ALT -- 34 -- 42 48  ALKPHOS -- 96 -- 102 104  BILITOT -- 3.6* -- 5.1* 5.6*  PROT -- 7.4 -- 7.1 7.2  ALBUMIN 2.8* 2.8* 2.7* 2.6* 2.7*   No results found for this basename: LIPASE:5,AMYLASE:5 in the last 168 hours No results found for this basename: AMMONIA:5 in the last 168 hours CBC:  Lab 02/24/12 0500 02/23/12 0340 02/22/12 1117 02/18/12  0520  WBC 16.0* 17.2* 16.6* 13.8*  NEUTROABS -- -- -- 5.9  HGB 6.7* 7.1* 5.8* 7.2*  HCT 18.2* 19.6* 15.8* 20.4*  MCV 91.0 91.2 88.8 98.1  PLT 244 246 226 183   Cardiac Enzymes:  Lab 02/19/12 1630 02/18/12 1636 02/18/12 1210  CKTOTAL 50 51 43  CKMB -- 1.5 1.6  CKMBINDEX -- -- --  TROPONINI -- -- --   BNP (last 3 results)  Basename 02/22/12 0345 02/16/12 1511 12/09/11 0755  PROBNP 1674.0* 742.6* 102.5   CBG: No results found for this basename: GLUCAP:5 in the last 168 hours  Recent Results (from the past 240 hour(s))  CULTURE, BLOOD (ROUTINE X 2)     Status: Normal   Collection Time   02/16/12  6:05 PM      Component Value Range Status Comment   Specimen Description BLOOD RIGHT HAND   Final    Special Requests NONE BOTTLES DRAWN AEROBIC AND ANAEROBIC 5CC   Final    Culture  Setup Time 02/16/2012 22:22   Final    Culture NO GROWTH 5 DAYS   Final    Report Status 02/22/2012 FINAL   Final   CULTURE, BLOOD (ROUTINE X 2)     Status: Normal    Collection Time   02/16/12  6:12 PM      Component Value Range Status Comment   Specimen Description BLOOD RIGHT HAND   Final    Special Requests NONE BOTTLES DRAWN AEROBIC AND ANAEROBIC 5CC   Final    Culture  Setup Time 02/16/2012 22:22   Final    Culture NO GROWTH 5 DAYS   Final    Report Status 02/22/2012 FINAL   Final   URINE CULTURE     Status: Normal   Collection Time   02/16/12  6:44 PM      Component Value Range Status Comment   Specimen Description URINE, CLEAN CATCH   Final    Special Requests NONE   Final    Culture  Setup Time 02/16/2012 22:26   Final    Colony Count NO GROWTH   Final    Culture NO GROWTH   Final    Report Status 02/17/2012 FINAL   Final   MRSA PCR SCREENING     Status: Abnormal   Collection Time   02/16/12  9:10 PM      Component Value Range Status Comment   MRSA by PCR POSITIVE (*) NEGATIVE Final      Studies: Dg Chest 2 View  02/22/2012  *RADIOLOGY REPORT*  Clinical Data: Hypoxia and bodyaches.  CHEST - 2 VIEW  Comparison: 02/20/2012  Findings: Right-sided PICC line unchanged tip at low SVC. Midline trachea.  Moderate cardiomegaly.  Moderate interstitial edema, minimally progressive.  Small right pleural effusion is unchanged. No pneumothorax.  IMPRESSION: Minimal progression of moderate congestive heart failure.  Similar small right pleural effusion.   Original Report Authenticated By: Jeronimo Greaves, M.D.    Dg Chest 2 View  02/20/2012  *RADIOLOGY REPORT*  Clinical Data: No edema.  CHF.  Sickle cell.  CHEST - 2 VIEW  Comparison: 02/18/2012.  Findings: Cardiac enlargement.  Pulmonary vascular congestion. Small right pleural effusion.  Nodular interstitial changes in the lung bases may represent mild edema.  No pneumothorax.  Mediastinal contours appear intact.  There is been interval placement of a right PICC catheter with tip overlying the cavoatrial junction.  IMPRESSION: Cardiac enlargement with pulmonary vascular congestion and possible early edema.  The  small right pleural effusion.   Original Report Authenticated By: Burman Nieves, M.D.    Dg Chest 2 View  02/18/2012  *RADIOLOGY REPORT*  Clinical Data: Hypoxemia.  Sickle cell crisis.  CHEST - 2 VIEW  Comparison: 01/14 02/16/2012 and 12/09/2011  Findings: There is chronic cardiomegaly with chronic pulmonary vascular congestion.  However, the interstitial edema has resolved. There is chronic blunting of the right costophrenic angle.  IMPRESSION: Resolution of interstitial edema.  Persistent pulmonary vascular congestion and cardiomegaly.   Original Report Authenticated By: Francene Boyers, M.D.    Dg Chest 2 View  02/17/2012  *RADIOLOGY REPORT*  Clinical Data: 42 year old male sickle cell crisis.  Shortness of breath, cough.  CHEST - 2 VIEW  Comparison: 02/16/2012 and earlier.  Findings: Stable cardiomegaly and mediastinal contours.  Stable lung volumes.  Pulmonary vascular congestion/widespread increased interstitial markings are stable and appear to be at baseline.  No pneumothorax or pleural effusion.  The right costophrenic angle scarring suspected.  Mild increased nodular opacity here compared to prior studies. Visualized tracheal air column is within normal limits.  No acute osseous abnormality identified.  IMPRESSION: Mild patchy opacity at the right costophrenic angle might reflect resolving inflammation/acute chest syndrome.  Otherwise, stable chronic findings in the chest.   Original Report Authenticated By: Erskine Speed, M.D.    Dg Chest 2 View  02/16/2012  *RADIOLOGY REPORT*  Clinical Data: Sickle cell crisis with shortness of breath and body aches.  Low O2 sats.  CHEST - 2 VIEW  Comparison: 12/09/2011.  Findings: Trachea is midline.  Heart is mildly enlarged.  There is mild diffuse bilateral air space disease.  No pleural fluid.  IMPRESSION: Mild diffuse bilateral air space disease which may be due to edema or pneumonia.   Original Report Authenticated By: Leanna Battles, M.D.    US  Renal  02/19/2012  *RADIOLOGY REPORT*  Clinical Data: Acute renal failure, history of sickle cell disease  RENAL/URINARY TRACT ULTRASOUND COMPLETE  Comparison:  None.  Findings:  Right Kidney:  Normal cortical thickness and size, measuring 12.0 cm in length. There is minimal increased echogenicity of the renal parenchyma.  No focal renal lesions.  No echogenic renal stones. No urinary obstruction.  Left Kidney:  Normal cortical thickness and size, measuring 14.0 cm in length. There is minimal increased echogenicity of the renal parenchyma.  No focal renal lesions.  No echogenic renal stones. No urinary obstruction.  Bladder:  Normal given degree of distension.  IMPRESSION: Mild increased echogenicity of the renal parenchyma, nonspecific but may be seen in the setting of medical renal disease.  No evidence of renal atrophy or urinary obstruction.   Original Report Authenticated By: Tacey Ruiz, MD    Ir Fluoro Guide Cv Line Right  02/20/2012  *RADIOLOGY REPORT*  Clinical Data: Patient with history of sickle cell crisis and poor venous access.  Central venous access is requested for fluids  and medications.  PICC LINE PLACEMENT WITH ULTRASOUND AND FLUOROSCOPIC  GUIDANCE  Fluoroscopy Time: 0.4 minutes.  The right arm was prepped with chlorhexidine, draped in the usual sterile fashion using maximum barrier technique (cap and mask, sterile gown, sterile gloves, large sterile sheet, hand hygiene and cutaneous antisepsis) and infiltrated locally with 1% Lidocaine.  Ultrasound demonstrated patency of the right brachial vein, and this was documented with an image.  Under real-time ultrasound guidance, this vein was accessed with a 21 gauge micropuncture needle and image documentation was performed.  The needle was exchanged over a guidewire  for a peel-away sheath through which a five [place] double lumen PICC trimmed to 40 cm was advanced, positioned with its tip at the lower SVC/right atrial junction. Fluoroscopy during  the procedure and fluoro spot radiograph confirms appropriate catheter position.  The catheter was flushed, secured to the skin with Prolene sutures, and covered with a sterile dressing.  Complications:  None  IMPRESSION: Successful right arm PICC line placement with ultrasound and fluoroscopic guidance.  The catheter is ready for use.  Read by: Jeananne Rama, P.A.-C   Original Report Authenticated By: Tacey Ruiz, MD    Ir US Guide Vasc Access Right  02/20/2012  *RADIOLOGY REPORT*  Clinical Data: Patient with history of sickle cell crisis and poor venous access.  Central venous access is requested for fluids  and medications.  PICC LINE PLACEMENT WITH ULTRASOUND AND FLUOROSCOPIC  GUIDANCE  Fluoroscopy Time: 0.4 minutes.  The right arm was prepped with chlorhexidine, draped in the usual sterile fashion using maximum barrier technique (cap and mask, sterile gown, sterile gloves, large sterile sheet, hand hygiene and cutaneous antisepsis) and infiltrated locally with 1% Lidocaine.  Ultrasound demonstrated patency of the right brachial vein, and this was documented with an image.  Under real-time ultrasound guidance, this vein was accessed with a 21 gauge micropuncture needle and image documentation was performed.  The needle was exchanged over a guidewire for a peel-away sheath through which a five [place] double lumen PICC trimmed to 40 cm was advanced, positioned with its tip at the lower SVC/right atrial junction. Fluoroscopy during the procedure and fluoro spot radiograph confirms appropriate catheter position.  The catheter was flushed, secured to the skin with Prolene sutures, and covered with a sterile dressing.  Complications:  None  IMPRESSION: Successful right arm PICC line placement with ultrasound and fluoroscopic guidance.  The catheter is ready for use.  Read by: Jeananne Rama, P.A.-C   Original Report Authenticated By: Tacey Ruiz, MD     Scheduled Meds:   . docusate sodium  100 mg Oral BID  .  enoxaparin (LOVENOX) injection  40 mg Subcutaneous Q24H  . folic acid  1 mg Oral Daily  . furosemide  80 mg Oral BID  . HYDROcodone-acetaminophen  1 tablet Oral Q4H  . levofloxacin (LEVAQUIN) IV  750 mg Intravenous Q24H  . methadone  10 mg Oral TID  . metoprolol tartrate  25 mg Oral BID  . pantoprazole  40 mg Oral Q0600   Continuous Infusions:  Total time spent- 39 minutes.

## 2012-02-25 ENCOUNTER — Encounter: Payer: Self-pay | Admitting: Hematology

## 2012-02-25 LAB — CBC
MCH: 31.4 pg (ref 26.0–34.0)
MCV: 89.2 fL (ref 78.0–100.0)
Platelets: 243 10*3/uL (ref 150–400)
RDW: 24.7 % — ABNORMAL HIGH (ref 11.5–15.5)

## 2012-02-25 LAB — DIFFERENTIAL
Band Neutrophils: 0 % (ref 0–10)
Blasts: 0 %
Lymphocytes Relative: 36 % (ref 12–46)
Lymphs Abs: 6.6 10*3/uL — ABNORMAL HIGH (ref 0.7–4.0)
Neutrophils Relative %: 48 % (ref 43–77)
Promyelocytes Absolute: 0 %

## 2012-02-25 LAB — HEPATIC FUNCTION PANEL
AST: 123 U/L — ABNORMAL HIGH (ref 0–37)
Bilirubin, Direct: 0.9 mg/dL — ABNORMAL HIGH (ref 0.0–0.3)
Total Bilirubin: 5.8 mg/dL — ABNORMAL HIGH (ref 0.3–1.2)

## 2012-02-25 LAB — RENAL FUNCTION PANEL
Albumin: 2.9 g/dL — ABNORMAL LOW (ref 3.5–5.2)
Calcium: 8.6 mg/dL (ref 8.4–10.5)
Creatinine, Ser: 1.63 mg/dL — ABNORMAL HIGH (ref 0.50–1.35)
GFR calc non Af Amer: 51 mL/min — ABNORMAL LOW (ref >90)

## 2012-02-25 MED ORDER — HYDROMORPHONE HCL PF 2 MG/ML IJ SOLN
2.0000 mg | INTRAMUSCULAR | Status: DC | PRN
  Administered 2012-02-25 – 2012-02-27 (×8): 2 mg via INTRAVENOUS
  Filled 2012-02-25 (×8): qty 1

## 2012-02-25 MED ORDER — HYDROCODONE-ACETAMINOPHEN 5-325 MG PO TABS
1.0000 | ORAL_TABLET | ORAL | Status: DC
  Administered 2012-02-25 – 2012-02-26 (×5): 1 via ORAL
  Administered 2012-02-27 (×2): 2 via ORAL
  Administered 2012-02-27: 1 via ORAL
  Filled 2012-02-25 (×3): qty 1
  Filled 2012-02-25 (×2): qty 2
  Filled 2012-02-25 (×3): qty 1

## 2012-02-25 MED ORDER — METOPROLOL TARTRATE 50 MG PO TABS
50.0000 mg | ORAL_TABLET | Freq: Two times a day (BID) | ORAL | Status: DC
  Administered 2012-02-25 – 2012-02-27 (×4): 50 mg via ORAL
  Filled 2012-02-25 (×5): qty 1

## 2012-02-25 NOTE — Progress Notes (Signed)
Subjective:  Episode  Of SVT yesterday Required IV lopressor No more elevated HR's reported  Patient is out of the room walking - I could not locate him to interview or examine but all records below are reviewed  Objective:     Vital signs in last 24 hours: Filed Vitals:   02/24/12 2007 02/24/12 2141 02/25/12 0539 02/25/12 0955  BP:  119/61 119/58 101/44  Pulse: 76 71 82 78  Temp:  98.5 F (36.9 C) 98.5 F (36.9 C)   TempSrc:  Oral Oral   Resp:  18 18   Height:      Weight:   79.6 kg (175 lb 7.8 oz)   SpO2: 94% 98% 94%    Weight change: -1.821 kg (-4 lb 0.2 oz)  Intake/Output Summary (Last 24 hours) at 02/25/12 1333 Last data filed at 02/25/12 0300  Gross per 24 hour  Intake    600 ml  Output   1900 ml  Net  -1300 ml    Physical Exam:  Blood pressure 101/44, pulse 78, temperature 98.5 F (36.9 C), temperature source Oral, resp. rate 18, height 5\' 10"  (1.778 m), weight 79.6 kg (175 lb 7.8 oz), SpO2 94.00%.  02/25/12 0539 79.6 kg  02/24/12 0422 81.421 kg  02/23/12 0443 83.5 kg  02/22/12 0507 84.2 kg  Patient out of his room and I could not locate so no exam is done.  Labs:   Lab 02/25/12 0500 02/24/12 0500 02/23/12 0340 02/22/12 0345 02/21/12 0425 02/20/12 0450 02/19/12 0456  NA 138 137 139 136 137 140 137  K 4.5 4.5 4.5 4.6 4.7 5.0 4.9  CL 102 104 106 107 109 112 107  CO2 27 25 25 21 23 20 20   GLUCOSE 97 98 89 101* 105* 101* 85  BUN 33* 28* 25* 27* 26* 31* 32*  CREATININE 1.63* 1.49* 1.66* 1.53* 1.44* 1.58* 1.57*  ALB -- -- -- -- -- -- --  CALCIUM 8.6 8.8 8.7 8.6 8.6 8.3* 8.2*  PHOS 5.2* 5.0* -- -- 3.2 -- --     Lab 02/25/12 0500 02/24/12 0500 02/23/12 0340 02/19/12 0456  AST 123* -- 115* 108*  ALT 39 -- 34 42  ALKPHOS 101 -- 96 102  BILITOT 5.8* -- 3.6* 5.1*  PROT 7.7 -- 7.4 7.1  ALBUMIN 2.9*2.9* 2.8* 2.8* --   No results found for this basename: LIPASE:3,AMYLASE:3 in the last 168 hours No results found for this basename: AMMONIA:3 in the last 168  hours   Lab 02/25/12 0505 02/24/12 0500 02/23/12 0340 02/22/12 1117  WBC 18.2* 16.0* 17.2* 16.6*  NEUTROABS 8.7* -- -- --  HGB 6.1* 6.7* 7.1* 5.8*  HCT 17.3* 18.2* 19.6* 15.8*  MCV 89.2 91.0 91.2 88.8  PLT 243 244 246 226    Lab 02/19/12 1630 02/18/12 1636  CKTOTAL 50 51  CKMB -- 1.5  CKMBINDEX -- --  TROPONINI -- --       Medications:  . docusate sodium  100 mg Oral BID  . enoxaparin (LOVENOX) injection  40 mg Subcutaneous Q24H  . folic acid  1 mg Oral Daily  . HYDROcodone-acetaminophen  1 tablet Oral Q4H  . methadone  10 mg Oral TID  . metoprolol tartrate  25 mg Oral BID  . pantoprazole  40 mg Oral Q0600     I  have reviewed scheduled and prn medications.  ASSESSMENT/RECOMMENDATIONS  AKI in 42 year old with sickle cell, rec SVT, mitral regurg, current sickle crisis and HCAP  (Baseline  creatinine 0.7-0.9/US 12 and 14 cm kidneys, min increase echogenicity, about 800 proteinuria)  Initially AKI felt d/t diuresis/iv Toradol and/or hemodynamics d/t CHF  Cannot r/o pap necrosis/intramedullary sickling  Creatinine bumped up a bit again today - may have gotten drier than he can tolerate   Stop lasix 80 BID/ consider restart in 2 days at 40mg /day   He is problematic b/c soft BP's compromise renal perfusion/fluid balance delicate/probable interstitial lung ds confuses question of interstitial edema   Camille Bal, MD [facility] [phone] Pager 02/25/2012, 1:33 PM

## 2012-02-25 NOTE — Progress Notes (Signed)
SICKLE CELL SERVICE PROGRESS NOTE  [name] ZOX:[id number] DOB: 1970-12-23 DOA: 02/16/2012 PCP: Willey Blade, MD  Assessment/Plan: Active Problems:  Acute renal failure: The problem that has prolonged hospitalization has been the acute kidney injury. It was felt to be ATN secondary to multiple factors. Renal U/S showed findings consistent with medical renal disease but no hydronephrosis.  I have spoken with Dr. Eliott Nine and she feels that patient will be ready for discharge home likely tomorrow a long as renal function continues to improve. I have discussed with Dr. Eliott Nine the possibility of intramedullary sickling. Doubt papillary necrosis as no hematuria present. We have discussed and will discontinue lasix. Recheck renal function in the morning.  SVT (supraventricular tachycardia): On presentation, pt had HR of 150-190 and required adenosine conversion in the ER. He continued to have intermittent sinus tachycardia and was started on Metoprolol which as been titrated to 25 mg BID. He has had continued to have elevated HR with activity and required IV metoprolol yesterday. Cardiology to see patient today. Need to be careful with increasing Metoprolol due to marginal BP. Pt has been educated about not discontinuing the metoprolol as he did in the past.   Hypoxia: Resolved. 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 He offers that whlile in college he was required to use oxygen on a continuous basis but is unsure why. Nevertheless, hypoxia is resolved at this time and patient is to follow up as an outpatient with pulmonary. He will need PFT's.  Sickle cell crisis: Pt now having increased pain. Will escalate treatment to prevent going back into crisis.   Anemia: Pt's baseline Hb is 6.0. However, due to the initial presentation and hypoxemia, pt was transfused 2 Units of PRBC's to improve oxygen carrying capacity.  He is at his basline of 6.0 however showing signs of acute VOC. Will transfuse 1  unit PRBC's.  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 Spectrum was narrowed to Levaquin and patient completed a total of 8 days of antibiotics.  Mitral Regurgitation: ECHO showed mitral regurgitation with some valve abnormality. HE was evaluated by cardiology who have recommended follow-up echo in the future.   Leukocytosis: Pt has continued to have a persistent leukocytosis without a reasonable explanation. I do not believe that the etiology is infectious. Blood cultures and urine cultures are negative.Question if patient has a myelodysplastic process occurring. Will need to have WBC re-evaluated during the convalescent state.   Acute chest syndrome(517.3): PT was treated for acute chest syndrome on admission, however given clinical course, it is likely that his clinical presentation was all secondary to pulmonary edema secondary to mitral regurgitation and SVT.   Hyperkalemia: Pt was treated with Kayexalate and has had a resolution. It was felt to be secondary to acute kidney injury.    Code Status: Full Code Family Communication: N/A Disposition Plan: Home in 24-48 hours  [name] A.  Pager [phone]. If 7PM-7AM, please contact night-coverage.  02/25/2012, 5:34 PM  LOS: 9 days   Brief narrative: Pt is a 42 y.o. male African American with a history of sickle cell anemia, sinus tachycardia, chronic fatigue syndrome, gouty arthropathy, last hospitalization 12/07/2011 to 12/09/2011 4 sickle cell crisis who presents to the ED from his PCPs office with hypoxia with sats of 83% per ED report. Patient stated that went to see his PCP for followup appointment and he had started to develop a crisis with bilateral hip pain and knee pain 3 days  prior to admission. Patient does endorse some shortness of breath on exertion when he and legs up or down stairs, subjective fevers, sneezing, bilateral hip and knee pain, constipation. Patient denies any chest  pain, no nausea, no vomiting, no cough, no sore throat, no abdominal pain, no diarrhea, no dysuria, no weakness. Patient does endorse nocturia as well as congestion. Patient was seen in the ED with initial sats of 88% on room. Patient also noted to be in SVT with heart rate going up as high as 181 was given 6 mg of adenosine and would return to a sinus tachycardia with heart rate in the 90s to low 100s. CBC obtained showed a white count of 23.5 hemoglobin of 6.5. Chest x-ray was worrisome for pneumonia versus edema. One unit of packed red blood cells ordered to be transfused. Will call to admit the patient for further evaluation and management.   Consultants:  Nephrology  Cardiology  Pulmonology  Procedures:  PICC line placement 1/17 2014  Antibiotics:  Vancomycin 1/13 >>1/16  Zosyn 1/13 >>1/16  Levaquin 1/13 >> 1/21  HPI/Subjective: Pt states that he feels well and is anticipating being  Discharged home soon.  Objective: Filed Vitals:   02/24/12 2141 02/25/12 0539 02/25/12 0955 02/25/12 1400  BP: 119/61 119/58 101/44 100/47  Pulse: 71 82 78 78  Temp: 98.5 F (36.9 C) 98.5 F (36.9 C)  99.4 F (37.4 C)  TempSrc: Oral Oral  Oral  Resp: 18 18  18   Height:      Weight:  79.6 kg (175 lb 7.8 oz)    SpO2: 98% 94%  92%   Weight change: -1.821 kg (-4 lb 0.2 oz)  Intake/Output Summary (Last 24 hours) at 02/25/12 1734 Last data filed at 02/25/12 0300  Gross per 24 hour  Intake      0 ml  Output   1550 ml  Net  -1550 ml    General: Alert, awake, oriented x3, in no acute distress. Well appearing HEENT: [place]/AT PEERL, EOMI, Anicteric OROPHARYNX:  Moist, No exudate/ erythema/lesions.  Heart: Regular rate and rhythm, without murmurs, rubs, gallops.  Lungs: Crackles noted throughout lung fields. No wheezing no rales.  Abdomen: Soft, nontender, nondistended, positive bowel sounds, no masses no hepatosplenomegaly noted..  Neuro: No focal neurological deficits noted cranial nerves  II through XII grossly intact. Musculoskeletal: No warm swelling or erythema around joints, no spinal tenderness noted.   Data Reviewed: Basic Metabolic Panel:  Lab 02/25/12 4098 02/24/12 0500 02/23/12 0340 02/22/12 0345 02/21/12 0425  NA 138 137 139 136 137  K 4.5 4.5 4.5 4.6 4.7  CL 102 104 106 107 109  CO2 27 25 25 21 23   GLUCOSE 97 98 89 101* 105*  BUN 33* 28* 25* 27* 26*  CREATININE 1.63* 1.49* 1.66* 1.53* 1.44*  CALCIUM 8.6 8.8 8.7 8.6 8.6  MG -- -- 2.1 -- --  PHOS 5.2* 5.0* -- -- 3.2   Liver Function Tests:  Lab 02/25/12 0500 02/24/12 0500 02/23/12 0340 02/21/12 0425 02/19/12 0456  AST 123* -- 115* -- 108*  ALT 39 -- 34 -- 42  ALKPHOS 101 -- 96 -- 102  BILITOT 5.8* -- 3.6* -- 5.1*  PROT 7.7 -- 7.4 -- 7.1  ALBUMIN 2.9*2.9* 2.8* 2.8* 2.7* 2.6*   No results found for this basename: LIPASE:5,AMYLASE:5 in the last 168 hours No results found for this basename: AMMONIA:5 in the last 168 hours CBC:  Lab 02/25/12 0505 02/24/12 0500 02/23/12 0340 02/22/12 1117  WBC  18.2* 16.0* 17.2* 16.6*  NEUTROABS 8.7* -- -- --  HGB 6.1* 6.7* 7.1* 5.8*  HCT 17.3* 18.2* 19.6* 15.8*  MCV 89.2 91.0 91.2 88.8  PLT 243 244 246 226   Cardiac Enzymes:  Lab 02/19/12 1630  CKTOTAL 50  CKMB --  CKMBINDEX --  TROPONINI --   BNP (last 3 results)  Basename 02/22/12 0345 02/16/12 1511 12/09/11 0755  PROBNP 1674.0* 742.6* 102.5   CBG: No results found for this basename: GLUCAP:5 in the last 168 hours  Recent Results (from the past 240 hour(s))  CULTURE, BLOOD (ROUTINE X 2)     Status: Normal   Collection Time   02/16/12  6:05 PM      Component Value Range Status Comment   Specimen Description BLOOD RIGHT HAND   Final    Special Requests NONE BOTTLES DRAWN AEROBIC AND ANAEROBIC 5CC   Final    Culture  Setup Time 02/16/2012 22:22   Final    Culture NO GROWTH 5 DAYS   Final    Report Status 02/22/2012 FINAL   Final   CULTURE, BLOOD (ROUTINE X 2)     Status: Normal   Collection Time    02/16/12  6:12 PM      Component Value Range Status Comment   Specimen Description BLOOD RIGHT HAND   Final    Special Requests NONE BOTTLES DRAWN AEROBIC AND ANAEROBIC 5CC   Final    Culture  Setup Time 02/16/2012 22:22   Final    Culture NO GROWTH 5 DAYS   Final    Report Status 02/22/2012 FINAL   Final   URINE CULTURE     Status: Normal   Collection Time   02/16/12  6:44 PM      Component Value Range Status Comment   Specimen Description URINE, CLEAN CATCH   Final    Special Requests NONE   Final    Culture  Setup Time 02/16/2012 22:26   Final    Colony Count NO GROWTH   Final    Culture NO GROWTH   Final    Report Status 02/17/2012 FINAL   Final   MRSA PCR SCREENING     Status: Abnormal   Collection Time   02/16/12  9:10 PM      Component Value Range Status Comment   MRSA by PCR POSITIVE (*) NEGATIVE Final      Studies: Dg Chest 2 View  02/22/2012  *RADIOLOGY REPORT*  Clinical Data: Hypoxia and bodyaches.  CHEST - 2 VIEW  Comparison: 02/20/2012  Findings: Right-sided PICC line unchanged tip at low SVC. Midline trachea.  Moderate cardiomegaly.  Moderate interstitial edema, minimally progressive.  Small right pleural effusion is unchanged. No pneumothorax.  IMPRESSION: Minimal progression of moderate congestive heart failure.  Similar small right pleural effusion.   Original Report Authenticated By: Jeronimo Greaves, M.D.    Dg Chest 2 View  02/20/2012  *RADIOLOGY REPORT*  Clinical Data: No edema.  CHF.  Sickle cell.  CHEST - 2 VIEW  Comparison: 02/18/2012.  Findings: Cardiac enlargement.  Pulmonary vascular congestion. Small right pleural effusion.  Nodular interstitial changes in the lung bases may represent mild edema.  No pneumothorax.  Mediastinal contours appear intact.  There is been interval placement of a right PICC catheter with tip overlying the cavoatrial junction.  IMPRESSION: Cardiac enlargement with pulmonary vascular congestion and possible early edema.  The small right pleural  effusion.   Original Report Authenticated By: Burman Nieves, M.D.  Dg Chest 2 View  02/18/2012  *RADIOLOGY REPORT*  Clinical Data: Hypoxemia.  Sickle cell crisis.  CHEST - 2 VIEW  Comparison: 01/14 02/16/2012 and 12/09/2011  Findings: There is chronic cardiomegaly with chronic pulmonary vascular congestion.  However, the interstitial edema has resolved. There is chronic blunting of the right costophrenic angle.  IMPRESSION: Resolution of interstitial edema.  Persistent pulmonary vascular congestion and cardiomegaly.   Original Report Authenticated By: Francene Boyers, M.D.    Dg Chest 2 View  02/17/2012  *RADIOLOGY REPORT*  Clinical Data: 42 year old male sickle cell crisis.  Shortness of breath, cough.  CHEST - 2 VIEW  Comparison: 02/16/2012 and earlier.  Findings: Stable cardiomegaly and mediastinal contours.  Stable lung volumes.  Pulmonary vascular congestion/widespread increased interstitial markings are stable and appear to be at baseline.  No pneumothorax or pleural effusion.  The right costophrenic angle scarring suspected.  Mild increased nodular opacity here compared to prior studies. Visualized tracheal air column is within normal limits.  No acute osseous abnormality identified.  IMPRESSION: Mild patchy opacity at the right costophrenic angle might reflect resolving inflammation/acute chest syndrome.  Otherwise, stable chronic findings in the chest.   Original Report Authenticated By: Erskine Speed, M.D.    Dg Chest 2 View  02/16/2012  *RADIOLOGY REPORT*  Clinical Data: Sickle cell crisis with shortness of breath and body aches.  Low O2 sats.  CHEST - 2 VIEW  Comparison: 12/09/2011.  Findings: Trachea is midline.  Heart is mildly enlarged.  There is mild diffuse bilateral air space disease.  No pleural fluid.  IMPRESSION: Mild diffuse bilateral air space disease which may be due to edema or pneumonia.   Original Report Authenticated By: Leanna Battles, M.D.    US Renal  02/19/2012  *RADIOLOGY  REPORT*  Clinical Data: Acute renal failure, history of sickle cell disease  RENAL/URINARY TRACT ULTRASOUND COMPLETE  Comparison:  None.  Findings:  Right Kidney:  Normal cortical thickness and size, measuring 12.0 cm in length. There is minimal increased echogenicity of the renal parenchyma.  No focal renal lesions.  No echogenic renal stones. No urinary obstruction.  Left Kidney:  Normal cortical thickness and size, measuring 14.0 cm in length. There is minimal increased echogenicity of the renal parenchyma.  No focal renal lesions.  No echogenic renal stones. No urinary obstruction.  Bladder:  Normal given degree of distension.  IMPRESSION: Mild increased echogenicity of the renal parenchyma, nonspecific but may be seen in the setting of medical renal disease.  No evidence of renal atrophy or urinary obstruction.   Original Report Authenticated By: Tacey Ruiz, MD    Ir Fluoro Guide Cv Line Right  02/20/2012  *RADIOLOGY REPORT*  Clinical Data: Patient with history of sickle cell crisis and poor venous access.  Central venous access is requested for fluids  and medications.  PICC LINE PLACEMENT WITH ULTRASOUND AND FLUOROSCOPIC  GUIDANCE  Fluoroscopy Time: 0.4 minutes.  The right arm was prepped with chlorhexidine, draped in the usual sterile fashion using maximum barrier technique (cap and mask, sterile gown, sterile gloves, large sterile sheet, hand hygiene and cutaneous antisepsis) and infiltrated locally with 1% Lidocaine.  Ultrasound demonstrated patency of the right brachial vein, and this was documented with an image.  Under real-time ultrasound guidance, this vein was accessed with a 21 gauge micropuncture needle and image documentation was performed.  The needle was exchanged over a guidewire for a peel-away sheath through which a five [place] double lumen PICC trimmed to 40 cm  was advanced, positioned with its tip at the lower SVC/right atrial junction. Fluoroscopy during the procedure and fluoro spot  radiograph confirms appropriate catheter position.  The catheter was flushed, secured to the skin with Prolene sutures, and covered with a sterile dressing.  Complications:  None  IMPRESSION: Successful right arm PICC line placement with ultrasound and fluoroscopic guidance.  The catheter is ready for use.  Read by: Jeananne Rama, P.A.-C   Original Report Authenticated By: Tacey Ruiz, MD    Ir US Guide Vasc Access Right  02/20/2012  *RADIOLOGY REPORT*  Clinical Data: Patient with history of sickle cell crisis and poor venous access.  Central venous access is requested for fluids  and medications.  PICC LINE PLACEMENT WITH ULTRASOUND AND FLUOROSCOPIC  GUIDANCE  Fluoroscopy Time: 0.4 minutes.  The right arm was prepped with chlorhexidine, draped in the usual sterile fashion using maximum barrier technique (cap and mask, sterile gown, sterile gloves, large sterile sheet, hand hygiene and cutaneous antisepsis) and infiltrated locally with 1% Lidocaine.  Ultrasound demonstrated patency of the right brachial vein, and this was documented with an image.  Under real-time ultrasound guidance, this vein was accessed with a 21 gauge micropuncture needle and image documentation was performed.  The needle was exchanged over a guidewire for a peel-away sheath through which a five [place] double lumen PICC trimmed to 40 cm was advanced, positioned with its tip at the lower SVC/right atrial junction. Fluoroscopy during the procedure and fluoro spot radiograph confirms appropriate catheter position.  The catheter was flushed, secured to the skin with Prolene sutures, and covered with a sterile dressing.  Complications:  None  IMPRESSION: Successful right arm PICC line placement with ultrasound and fluoroscopic guidance.  The catheter is ready for use.  Read by: Jeananne Rama, P.A.-C   Original Report Authenticated By: Tacey Ruiz, MD     Scheduled Meds:    . docusate sodium  100 mg Oral BID  . enoxaparin (LOVENOX)  injection  40 mg Subcutaneous Q24H  . folic acid  1 mg Oral Daily  . HYDROcodone-acetaminophen  1-2 tablet Oral Q4H  . methadone  10 mg Oral TID  . metoprolol tartrate  50 mg Oral BID  . pantoprazole  40 mg Oral Q0600   Continuous Infusions:  Total time spent- 32 minutes.

## 2012-02-25 NOTE — Progress Notes (Signed)
PT. AMBULATED WELL HR WAS 105.M WILL CONTINUE TO MONITOR AND OBSERVE PT.

## 2012-02-25 NOTE — Progress Notes (Signed)
Subjective:  No c/o SOB today. Checked back on patient because of call to on call cardiology with SVT.  Given IV metoprolol yest.   Feels fine today and in NSR.  Objective:  Vital Signs in the last 24 hours: BP 101/44  Pulse 78  Temp 98.5 F (36.9 C) (Oral)  Resp 18  Ht 5\' 10"  (1.778 m)  Wt 79.6 kg (175 lb 7.8 oz)  BMI 25.18 kg/m2  SpO2 94%  Physical Exam: Pleasant BM in NAD Lungs:  Rel clear Cardiac:  Regular rhythm, normal S1 and S2, no S3 2/6 systolic murmur, dynamic precordium Extremities:  No edema present  Intake/Output from previous day: 01/21 0701 - 01/22 0700 In: 720 [P.O.:720] Out: 2726 [Urine:2725; Stool:1] Weight Filed Weights   02/24/12 0420 02/24/12 0422 02/25/12 0539  Weight: 81.421 kg (179 lb 8 oz) 81.421 kg (179 lb 8 oz) 79.6 kg (175 lb 7.8 oz)    Lab Results: Basic Metabolic Panel:  Basename 02/25/12 0500 02/24/12 0500  NA 138 137  K 4.5 4.5  CL 102 104  CO2 27 25  GLUCOSE 97 98  BUN 33* 28*  CREATININE 1.63* 1.49*    CBC:  Basename 02/25/12 0505 02/24/12 0500  WBC 18.2* 16.0*  NEUTROABS 8.7* --  HGB 6.1* 6.7*  HCT 17.3* 18.2*  MCV 89.2 91.0  PLT 243 244    BNP    Component Value Date/Time   PROBNP 1674.0* 02/22/2012 0345    PROTIME: Lab Results  Component Value Date   INR 1.41 02/17/2012    Telemetry: Patient is not on telemerty  Assessment/Plan: 1. Mild mitral regurgitation 2. Pulmonary edema maybe from high output CHF, anemia, and vol overlad 3. Sickle crisis 4. Recurrent SVT. 5. Acute renal failure  Rec:  Increase metoprolol.  If he continue to have a lot of SVT add diltiazem.   If he has breakthrough arrythmias despite med therapy could be considered for SVT ablation.  Darden Palmer  MD [facility] Cardiology  02/25/2012, 2:20 PM

## 2012-02-26 LAB — TYPE AND SCREEN: Unit division: 0

## 2012-02-26 LAB — RENAL FUNCTION PANEL
BUN: 33 mg/dL — ABNORMAL HIGH (ref 6–23)
CO2: 27 mEq/L (ref 19–32)
GFR calc Af Amer: 61 mL/min — ABNORMAL LOW (ref >90)
Glucose, Bld: 89 mg/dL (ref 70–99)
Potassium: 4.6 mEq/L (ref 3.5–5.1)
Sodium: 140 mEq/L (ref 135–145)

## 2012-02-26 LAB — CBC
Hemoglobin: 7.4 g/dL — ABNORMAL LOW (ref 13.0–17.0)
MCH: 32.2 pg (ref 26.0–34.0)
RBC: 2.3 MIL/uL — ABNORMAL LOW (ref 4.22–5.81)
WBC: 16.3 10*3/uL — ABNORMAL HIGH (ref 4.0–10.5)

## 2012-02-26 NOTE — Progress Notes (Signed)
Patient ambulated in hall tonight with the NT. O2 sats stayed in the 90s on rm air and heart rate stayed in the 70s-80s throughout the walk. Patient tolerated the walk very well and is eager to go home. Will continue to monitor. [name]

## 2012-02-26 NOTE — Progress Notes (Signed)
SICKLE CELL SERVICE PROGRESS NOTE  [name] ZOX:[id number] DOB: 1970/09/11 DOA: 02/16/2012 PCP: Willey Blade, MD  Assessment/Plan: Active Problems:  Acute renal failure: The problem that has prolonged hospitalization has been the acute kidney injury. It was felt to be ATN secondary to multiple factors. Renal U/S showed findings consistent with medical renal disease but no hydronephrosis. I have discussed with Dr. Eliott Nine the possibility of intramedullary sickling. Doubt papillary necrosis as no hematuria present. No Lasix since yesterday and patient has had a mild improvement of renal function. . Recheck renal function in the morning.I have spoken with Dr. Eliott Nine and she feels that patient will be ready for discharge home likely tomorrow a long as renal function continues to improve.  SVT (supraventricular tachycardia): On presentation, pt had HR of 150-190 and required adenosine conversion in the ER. He continued to have intermittent sinus tachycardia and was started on Metoprolol which as been titrated to 25 mg BID. Pt has had no further SVT since yesterday.   Hypoxia: Resolved. 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 He offers that whlile in college he was required to use oxygen on a continuous basis but is unsure why. Nevertheless, hypoxia is resolved at this time and patient is to follow up as an outpatient with pulmonary. He will need PFT's.  Sickle cell crisis:  Pain improved since yesterday. Pt states that he is requiring less IV pain medications. This has  Been confirmed by [facility].   Anemia: Pt's baseline Hb is 6.0. However, due to the initial presentation and hypoxemia, pt was transfused 2 Units of PRBC's to improve oxygen carrying capacity.  He is at his basline of 6.0 however showing signs of acute VOC. Hb at 7.4 post transfusion.  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 Spectrum was narrowed to Levaquin and patient completed a total  of 8 days of antibiotics.  Mitral Regurgitation: ECHO showed mitral regurgitation with some valve abnormality. HE was evaluated by cardiology who have recommended follow-up echo in the future.   Leukocytosis: Pt has continued to have a persistent leukocytosis without a reasonable explanation. I do not believe that the etiology is infectious. Blood cultures and urine cultures are negative.Question if patient has a myelodysplastic process occurring. Will need to have WBC re-evaluated during the convalescent state.   Acute chest syndrome(517.3): PT was treated for acute chest syndrome on admission, however given clinical course, it is likely that his clinical presentation was all secondary to pulmonary edema secondary to mitral regurgitation and SVT.   Hyperkalemia: Pt was treated with Kayexalate and has had a resolution. It was felt to be secondary to acute kidney injury.    Code Status: Full Code Family Communication: N/A Disposition Plan: Home in 24-48 hours  [name] A.  Pager [phone]. If 7PM-7AM, please contact night-coverage.  02/26/2012, 5:04 PM  LOS: 10 days   Brief narrative: Pt is a 42 y.o. male African American with a history of sickle cell anemia, sinus tachycardia, chronic fatigue syndrome, gouty arthropathy, last hospitalization 12/07/2011 to 12/09/2011 4 sickle cell crisis who presents to the ED from his PCPs office with hypoxia with sats of 83% per ED report. Patient stated that went to see his PCP for followup appointment and he had started to develop a crisis with bilateral hip pain and knee pain 3 days prior to admission. Patient does endorse some shortness of breath on exertion when he and legs up or down stairs, subjective fevers, sneezing, bilateral  hip and knee pain, constipation. Patient denies any chest pain, no nausea, no vomiting, no cough, no sore throat, no abdominal pain, no diarrhea, no dysuria, no weakness. Patient does endorse nocturia as well as  congestion. Patient was seen in the ED with initial sats of 88% on room. Patient also noted to be in SVT with heart rate going up as high as 181 was given 6 mg of adenosine and would return to a sinus tachycardia with heart rate in the 90s to low 100s. CBC obtained showed a white count of 23.5 hemoglobin of 6.5. Chest x-ray was worrisome for pneumonia versus edema. One unit of packed red blood cells ordered to be transfused. Will call to admit the patient for further evaluation and management.   Consultants:  Nephrology  Cardiology  Pulmonology  Procedures:  PICC line placement 1/17 2014  Antibiotics:  Vancomycin 1/13 >>1/16  Zosyn 1/13 >>1/16  Levaquin 1/13 >> 1/21  HPI/Subjective: Pt states that he feels well and is anticipating being  discharged tomorrow.  Objective: Filed Vitals:   02/25/12 2127 02/26/12 0506 02/26/12 0603 02/26/12 1359  BP: 117/57 112/65 116/62 113/55  Pulse: 74 70 66 76  Temp: 98.2 F (36.8 C) 98.1 F (36.7 C) 98 F (36.7 C) 98.2 F (36.8 C)  TempSrc: Oral Oral Oral Oral  Resp: 18 18 18 20   Height:      Weight:   80.015 kg (176 lb 6.4 oz)   SpO2:  94% 94% 91%   Weight change: 0.414 kg (14.6 oz)  Intake/Output Summary (Last 24 hours) at 02/26/12 1704 Last data filed at 02/26/12 1300  Gross per 24 hour  Intake   1800 ml  Output    300 ml  Net   1500 ml    General: Alert, awake, oriented x3, in no acute distress. Well appearing HEENT: [place]/AT PEERL, EOMI, Anicteric OROPHARYNX:  Moist, No exudate/ erythema/lesions.  Heart: Regular rate and rhythm, without murmurs, rubs, gallops.  Lungs: Crackles noted throughout lung fields. No wheezing no rales.  Abdomen: Soft, nontender, nondistended, positive bowel sounds, no masses no hepatosplenomegaly noted..  Neuro: No focal neurological deficits noted cranial nerves II through XII grossly intact. Musculoskeletal: No warm swelling or erythema around joints, no spinal tenderness noted.   Data  Reviewed: Basic Metabolic Panel:  Lab 02/26/12 1610 02/25/12 0500 02/24/12 0500 02/23/12 0340 02/22/12 0345 02/21/12 0425  NA 140 138 137 139 136 --  K 4.6 4.5 4.5 4.5 4.6 --  CL 105 102 104 106 107 --  CO2 27 27 25 25 21  --  GLUCOSE 89 97 98 89 101* --  BUN 33* 33* 28* 25* 27* --  CREATININE 1.60* 1.63* 1.49* 1.66* 1.53* --  CALCIUM 8.4 8.6 8.8 8.7 8.6 --  MG -- -- -- 2.1 -- --  PHOS 4.8* 5.2* 5.0* -- -- 3.2   Liver Function Tests:  Lab 02/26/12 0500 02/25/12 0500 02/24/12 0500 02/23/12 0340 02/21/12 0425  AST -- 123* -- 115* --  ALT -- 39 -- 34 --  ALKPHOS -- 101 -- 96 --  BILITOT -- 5.8* -- 3.6* --  PROT -- 7.7 -- 7.4 --  ALBUMIN 2.8* 2.9*2.9* 2.8* 2.8* 2.7*   No results found for this basename: LIPASE:5,AMYLASE:5 in the last 168 hours No results found for this basename: AMMONIA:5 in the last 168 hours CBC:  Lab 02/26/12 0500 02/25/12 0505 02/24/12 0500 02/23/12 0340 02/22/12 1117  WBC 16.3* 18.2* 16.0* 17.2* 16.6*  NEUTROABS -- 8.7* -- -- --  HGB 7.4* 6.1* 6.7* 7.1* 5.8*  HCT 20.6* 17.3* 18.2* 19.6* 15.8*  MCV 89.6 89.2 91.0 91.2 88.8  PLT 276 243 244 246 226   Cardiac Enzymes: No results found for this basename: CKTOTAL:5,CKMB:5,CKMBINDEX:5,TROPONINI:5 in the last 168 hours BNP (last 3 results)  Basename 02/22/12 0345 02/16/12 1511 12/09/11 0755  PROBNP 1674.0* 742.6* 102.5   CBG: No results found for this basename: GLUCAP:5 in the last 168 hours  Recent Results (from the past 240 hour(s))  CULTURE, BLOOD (ROUTINE X 2)     Status: Normal   Collection Time   02/16/12  6:05 PM      Component Value Range Status Comment   Specimen Description BLOOD RIGHT HAND   Final    Special Requests NONE BOTTLES DRAWN AEROBIC AND ANAEROBIC 5CC   Final    Culture  Setup Time 02/16/2012 22:22   Final    Culture NO GROWTH 5 DAYS   Final    Report Status 02/22/2012 FINAL   Final   CULTURE, BLOOD (ROUTINE X 2)     Status: Normal   Collection Time   02/16/12  6:12 PM       Component Value Range Status Comment   Specimen Description BLOOD RIGHT HAND   Final    Special Requests NONE BOTTLES DRAWN AEROBIC AND ANAEROBIC 5CC   Final    Culture  Setup Time 02/16/2012 22:22   Final    Culture NO GROWTH 5 DAYS   Final    Report Status 02/22/2012 FINAL   Final   URINE CULTURE     Status: Normal   Collection Time   02/16/12  6:44 PM      Component Value Range Status Comment   Specimen Description URINE, CLEAN CATCH   Final    Special Requests NONE   Final    Culture  Setup Time 02/16/2012 22:26   Final    Colony Count NO GROWTH   Final    Culture NO GROWTH   Final    Report Status 02/17/2012 FINAL   Final   MRSA PCR SCREENING     Status: Abnormal   Collection Time   02/16/12  9:10 PM      Component Value Range Status Comment   MRSA by PCR POSITIVE (*) NEGATIVE Final      Studies: Dg Chest 2 View  02/22/2012  *RADIOLOGY REPORT*  Clinical Data: Hypoxia and bodyaches.  CHEST - 2 VIEW  Comparison: 02/20/2012  Findings: Right-sided PICC line unchanged tip at low SVC. Midline trachea.  Moderate cardiomegaly.  Moderate interstitial edema, minimally progressive.  Small right pleural effusion is unchanged. No pneumothorax.  IMPRESSION: Minimal progression of moderate congestive heart failure.  Similar small right pleural effusion.   Original Report Authenticated By: Jeronimo Greaves, M.D.    Dg Chest 2 View  02/20/2012  *RADIOLOGY REPORT*  Clinical Data: No edema.  CHF.  Sickle cell.  CHEST - 2 VIEW  Comparison: 02/18/2012.  Findings: Cardiac enlargement.  Pulmonary vascular congestion. Small right pleural effusion.  Nodular interstitial changes in the lung bases may represent mild edema.  No pneumothorax.  Mediastinal contours appear intact.  There is been interval placement of a right PICC catheter with tip overlying the cavoatrial junction.  IMPRESSION: Cardiac enlargement with pulmonary vascular congestion and possible early edema.  The small right pleural effusion.   Original  Report Authenticated By: Burman Nieves, M.D.    Dg Chest 2 View  02/18/2012  *RADIOLOGY REPORT*  Clinical Data:  Hypoxemia.  Sickle cell crisis.  CHEST - 2 VIEW  Comparison: 01/14 02/16/2012 and 12/09/2011  Findings: There is chronic cardiomegaly with chronic pulmonary vascular congestion.  However, the interstitial edema has resolved. There is chronic blunting of the right costophrenic angle.  IMPRESSION: Resolution of interstitial edema.  Persistent pulmonary vascular congestion and cardiomegaly.   Original Report Authenticated By: Francene Boyers, M.D.    Dg Chest 2 View  02/17/2012  *RADIOLOGY REPORT*  Clinical Data: 42 year old male sickle cell crisis.  Shortness of breath, cough.  CHEST - 2 VIEW  Comparison: 02/16/2012 and earlier.  Findings: Stable cardiomegaly and mediastinal contours.  Stable lung volumes.  Pulmonary vascular congestion/widespread increased interstitial markings are stable and appear to be at baseline.  No pneumothorax or pleural effusion.  The right costophrenic angle scarring suspected.  Mild increased nodular opacity here compared to prior studies. Visualized tracheal air column is within normal limits.  No acute osseous abnormality identified.  IMPRESSION: Mild patchy opacity at the right costophrenic angle might reflect resolving inflammation/acute chest syndrome.  Otherwise, stable chronic findings in the chest.   Original Report Authenticated By: Erskine Speed, M.D.    Dg Chest 2 View  02/16/2012  *RADIOLOGY REPORT*  Clinical Data: Sickle cell crisis with shortness of breath and body aches.  Low O2 sats.  CHEST - 2 VIEW  Comparison: 12/09/2011.  Findings: Trachea is midline.  Heart is mildly enlarged.  There is mild diffuse bilateral air space disease.  No pleural fluid.  IMPRESSION: Mild diffuse bilateral air space disease which may be due to edema or pneumonia.   Original Report Authenticated By: Leanna Battles, M.D.    US Renal  02/19/2012  *RADIOLOGY REPORT*  Clinical  Data: Acute renal failure, history of sickle cell disease  RENAL/URINARY TRACT ULTRASOUND COMPLETE  Comparison:  None.  Findings:  Right Kidney:  Normal cortical thickness and size, measuring 12.0 cm in length. There is minimal increased echogenicity of the renal parenchyma.  No focal renal lesions.  No echogenic renal stones. No urinary obstruction.  Left Kidney:  Normal cortical thickness and size, measuring 14.0 cm in length. There is minimal increased echogenicity of the renal parenchyma.  No focal renal lesions.  No echogenic renal stones. No urinary obstruction.  Bladder:  Normal given degree of distension.  IMPRESSION: Mild increased echogenicity of the renal parenchyma, nonspecific but may be seen in the setting of medical renal disease.  No evidence of renal atrophy or urinary obstruction.   Original Report Authenticated By: Tacey Ruiz, MD    Ir Fluoro Guide Cv Line Right  02/20/2012  *RADIOLOGY REPORT*  Clinical Data: Patient with history of sickle cell crisis and poor venous access.  Central venous access is requested for fluids  and medications.  PICC LINE PLACEMENT WITH ULTRASOUND AND FLUOROSCOPIC  GUIDANCE  Fluoroscopy Time: 0.4 minutes.  The right arm was prepped with chlorhexidine, draped in the usual sterile fashion using maximum barrier technique (cap and mask, sterile gown, sterile gloves, large sterile sheet, hand hygiene and cutaneous antisepsis) and infiltrated locally with 1% Lidocaine.  Ultrasound demonstrated patency of the right brachial vein, and this was documented with an image.  Under real-time ultrasound guidance, this vein was accessed with a 21 gauge micropuncture needle and image documentation was performed.  The needle was exchanged over a guidewire for a peel-away sheath through which a five [place] double lumen PICC trimmed to 40 cm was advanced, positioned with its tip at the lower SVC/right atrial junction.  Fluoroscopy during the procedure and fluoro spot radiograph  confirms appropriate catheter position.  The catheter was flushed, secured to the skin with Prolene sutures, and covered with a sterile dressing.  Complications:  None  IMPRESSION: Successful right arm PICC line placement with ultrasound and fluoroscopic guidance.  The catheter is ready for use.  Read by: Jeananne Rama, P.A.-C   Original Report Authenticated By: Tacey Ruiz, MD    Ir US Guide Vasc Access Right  02/20/2012  *RADIOLOGY REPORT*  Clinical Data: Patient with history of sickle cell crisis and poor venous access.  Central venous access is requested for fluids  and medications.  PICC LINE PLACEMENT WITH ULTRASOUND AND FLUOROSCOPIC  GUIDANCE  Fluoroscopy Time: 0.4 minutes.  The right arm was prepped with chlorhexidine, draped in the usual sterile fashion using maximum barrier technique (cap and mask, sterile gown, sterile gloves, large sterile sheet, hand hygiene and cutaneous antisepsis) and infiltrated locally with 1% Lidocaine.  Ultrasound demonstrated patency of the right brachial vein, and this was documented with an image.  Under real-time ultrasound guidance, this vein was accessed with a 21 gauge micropuncture needle and image documentation was performed.  The needle was exchanged over a guidewire for a peel-away sheath through which a five [place] double lumen PICC trimmed to 40 cm was advanced, positioned with its tip at the lower SVC/right atrial junction. Fluoroscopy during the procedure and fluoro spot radiograph confirms appropriate catheter position.  The catheter was flushed, secured to the skin with Prolene sutures, and covered with a sterile dressing.  Complications:  None  IMPRESSION: Successful right arm PICC line placement with ultrasound and fluoroscopic guidance.  The catheter is ready for use.  Read by: Jeananne Rama, P.A.-C   Original Report Authenticated By: Tacey Ruiz, MD     Scheduled Meds:    . docusate sodium  100 mg Oral BID  . enoxaparin (LOVENOX) injection  40 mg  Subcutaneous Q24H  . folic acid  1 mg Oral Daily  . HYDROcodone-acetaminophen  1-2 tablet Oral Q4H  . methadone  10 mg Oral TID  . metoprolol tartrate  50 mg Oral BID  . pantoprazole  40 mg Oral Q0600   Continuous Infusions:  Total time spent- 32 minutes.

## 2012-02-26 NOTE — Progress Notes (Signed)
Subjective:  No further SVT Patient has been walking "all over the place" Breathing fine No oxygen BP tolerating the higher dose of BB Off lasix since yesterday (AM was last dose) Objective:     Vital signs in last 24 hours: Filed Vitals:   02/25/12 2045 02/25/12 2127 02/26/12 0506 02/26/12 0603  BP: 107/57 117/57 112/65 116/62  Pulse: 72 74 70 66  Temp: 98.2 F (36.8 C) 98.2 F (36.8 C) 98.1 F (36.7 C) 98 F (36.7 C)  TempSrc: Oral Oral Oral Oral  Resp: 18 18 18 18   Height:      Weight:    80.015 kg (176 lb 6.4 oz)  SpO2:   94% 94%   Weight change: 0.414 kg (14.6 oz)  Intake/Output Summary (Last 24 hours) at 02/26/12 1221 Last data filed at 02/26/12 0754  Gross per 24 hour  Intake   1320 ml  Output    300 ml  Net   1020 ml    Physical Exam:  Blood pressure 116/62, pulse 66, temperature 98 F (36.7 C), temperature source Oral, resp. rate 18, height 5\' 10"  (1.778 m), weight 80.015 kg (176 lb 6.4 oz), SpO2 94.00%.  02/26/12 0603 80.015 kg (176 lb 6.4 oz) 02/25/12 0539 79.6 kg  02/24/12 0422 81.421 kg  02/23/12 0443 83.5 kg  02/22/12 0507 84.2 kg  Looks well No JVD Velcro crackles lungs S1S2 no S3 No edema whatsoever  Labs:   Lab 02/26/12 0500 02/25/12 0500 02/24/12 0500 02/23/12 0340 02/22/12 0345 02/21/12 0425 02/20/12 0450  NA 140 138 137 139 136 137 140  K 4.6 4.5 4.5 4.5 4.6 4.7 5.0  CL 105 102 104 106 107 109 112  CO2 27 27 25 25 21 23 20   GLUCOSE 89 97 98 89 101* 105* 101*  BUN 33* 33* 28* 25* 27* 26* 31*  CREATININE 1.60* 1.63* 1.49* 1.66* 1.53* 1.44* 1.58*  ALB -- -- -- -- -- -- --  CALCIUM 8.4 8.6 8.8 8.7 8.6 8.6 8.3*  PHOS 4.8* 5.2* 5.0* -- -- 3.2 --     Lab 02/26/12 0500 02/25/12 0500 02/24/12 0500 02/23/12 0340  AST -- 123* -- 115*  ALT -- 39 -- 34  ALKPHOS -- 101 -- 96  BILITOT -- 5.8* -- 3.6*  PROT -- 7.7 -- 7.4  ALBUMIN 2.8* 2.9*2.9* 2.8* --   No results found for this basename: LIPASE:3,AMYLASE:3 in the last 168 hours No  results found for this basename: AMMONIA:3 in the last 168 hours   Lab 02/26/12 0500 02/25/12 0505 02/24/12 0500 02/23/12 0340  WBC 16.3* 18.2* 16.0* 17.2*  NEUTROABS -- 8.7* -- --  HGB 7.4* 6.1* 6.7* 7.1*  HCT 20.6* 17.3* 18.2* 19.6*  MCV 89.6 89.2 91.0 91.2  PLT 276 243 244 246    Lab 02/19/12 1630  CKTOTAL 50  CKMB --  CKMBINDEX --  TROPONINI --       Medications:  . docusate sodium  100 mg Oral BID  . enoxaparin (LOVENOX) injection  40 mg Subcutaneous Q24H  . folic acid  1 mg Oral Daily  . HYDROcodone-acetaminophen  1 tablet Oral Q4H  . methadone  10 mg Oral TID  . metoprolol tartrate  25 mg Oral BID  . pantoprazole  40 mg Oral Q0600     I  have reviewed scheduled and prn medications.  ASSESSMENT/RECOMMENDATIONS  AKI in 42 year old with sickle cell, rec SVT, mitral regurg, current sickle crisis and HCAP  (Baseline creatinine 0.7-0.9/US 12  and 14 cm kidneys, min increase echogenicity, about 800 proteinuria)  Initially AKI felt d/t diuresis/iv Toradol and/or hemodynamics d/t CHF  Cannot r/o pap necrosis/intramedullary sickling  Creatinine stable today since lasix held  Current weight is euvolemic for him by exam   Off lasix 80 BID since AM dose yesterday   Would keep off for now   40 mg/day at D/c would be reasonable if close f/u arranged   No further recommendations   Will sign off for now   Please call if questions     Camille Bal, MD [facility] [phone] Pager 02/26/2012, 12:21 PM

## 2012-02-27 ENCOUNTER — Encounter: Payer: Self-pay | Admitting: Internal Medicine

## 2012-02-27 LAB — BASIC METABOLIC PANEL
BUN: 29 mg/dL — ABNORMAL HIGH (ref 6–23)
CO2: 27 mEq/L (ref 19–32)
Chloride: 106 mEq/L (ref 96–112)
GFR calc Af Amer: 71 mL/min — ABNORMAL LOW (ref >90)
Glucose, Bld: 95 mg/dL (ref 70–99)
Potassium: 4.9 mEq/L (ref 3.5–5.1)

## 2012-02-27 LAB — CBC
HCT: 20.9 % — ABNORMAL LOW (ref 39.0–52.0)
Hemoglobin: 7.3 g/dL — ABNORMAL LOW (ref 13.0–17.0)
RBC: 2.26 MIL/uL — ABNORMAL LOW (ref 4.22–5.81)
WBC: 14.9 10*3/uL — ABNORMAL HIGH (ref 4.0–10.5)

## 2012-02-27 MED ORDER — HYDROCODONE-ACETAMINOPHEN 5-325 MG PO TABS: 1.0000 | ORAL_TABLET | ORAL | Status: DC | PRN

## 2012-02-27 MED ORDER — METOPROLOL TARTRATE 50 MG PO TABS: 50.0000 mg | ORAL_TABLET | Freq: Two times a day (BID) | ORAL | Status: DC

## 2012-02-27 NOTE — Discharge Summary (Signed)
[name] MRN: [id number] DOB/AGE: [birth year] 40 y.o.  Admit date: 02/16/2012 Discharge date: 02/27/2012  Primary Care Physician:  Willey Blade, MD   Discharge Diagnoses:   Patient Active Problem List  Diagnosis  . Sickle cell anemia  . Gout  . Chronic pain  . Hypokalemia  . Leukocytosis  . Acute chest syndrome(517.3)  . Hyperkalemia  . Hemochromatosis  . Sickle cell crisis  . Acute respiratory distress  . HCAP (healthcare-associated pneumonia)  . Hypoxia  . SVT (supraventricular tachycardia)  . Anemia  . Jaundice  . Right knee pain  . Acute renal failure    DISCHARGE MEDICATION:   Medication List     As of 02/27/2012  8:21 AM    STOP taking these medications         ibuprofen 200 MG tablet   Commonly known as: ADVIL,MOTRIN      TAKE these medications         HYDROcodone-acetaminophen 5-325 MG per tablet   Commonly known as: NORCO/VICODIN   Take 1-2 tablets by mouth every 4 (four) hours as needed for pain.      methadone 10 MG tablet   Commonly known as: DOLOPHINE   Take 1 tablet (10 mg total) by mouth 3 (three) times daily.      metoprolol 50 MG tablet   Commonly known as: LOPRESSOR   Take 1 tablet (50 mg total) by mouth 2 (two) times daily.      sodium chloride 0.65 % nasal spray   Commonly known as: OCEAN   Place 1 spray into the nose as needed. For nasal congestion      zolpidem 10 MG tablet   Commonly known as: AMBIEN   Take 10 mg by mouth at bedtime as needed. For sleep.          Consults: Treatment Team:  Othella Boyer, MD Maree Krabbe, MD Nyoka Cowden, MD   SIGNIFICANT DIAGNOSTIC STUDIES:  Dg Chest 2 View  02/22/2012  *RADIOLOGY REPORT*  Clinical Data: Hypoxia and bodyaches.  CHEST - 2 VIEW  Comparison: 02/20/2012  Findings: Right-sided PICC line unchanged tip at low SVC. Midline trachea.  Moderate cardiomegaly.  Moderate interstitial edema, minimally progressive.  Small right pleural effusion is unchanged. No  pneumothorax.  IMPRESSION: Minimal progression of moderate congestive heart failure.  Similar small right pleural effusion.   Original Report Authenticated By: Jeronimo Greaves, M.D.    Dg Chest 2 View  02/20/2012  *RADIOLOGY REPORT*  Clinical Data: No edema.  CHF.  Sickle cell.  CHEST - 2 VIEW  Comparison: 02/18/2012.  Findings: Cardiac enlargement.  Pulmonary vascular congestion. Small right pleural effusion.  Nodular interstitial changes in the lung bases may represent mild edema.  No pneumothorax.  Mediastinal contours appear intact.  There is been interval placement of a right PICC catheter with tip overlying the cavoatrial junction.  IMPRESSION: Cardiac enlargement with pulmonary vascular congestion and possible early edema.  The small right pleural effusion.   Original Report Authenticated By: Burman Nieves, M.D.    Dg Chest 2 View  02/18/2012  *RADIOLOGY REPORT*  Clinical Data: Hypoxemia.  Sickle cell crisis.  CHEST - 2 VIEW  Comparison: 01/14 02/16/2012 and 12/09/2011  Findings: There is chronic cardiomegaly with chronic pulmonary vascular congestion.  However, the interstitial edema has resolved. There is chronic blunting of the right costophrenic angle.  IMPRESSION: Resolution of interstitial edema.  Persistent pulmonary vascular congestion and cardiomegaly.   Original Report Authenticated By: Francene Boyers, M.D.  Dg Chest 2 View  02/17/2012  *RADIOLOGY REPORT*  Clinical Data: 42 year old male sickle cell crisis.  Shortness of breath, cough.  CHEST - 2 VIEW  Comparison: 02/16/2012 and earlier.  Findings: Stable cardiomegaly and mediastinal contours.  Stable lung volumes.  Pulmonary vascular congestion/widespread increased interstitial markings are stable and appear to be at baseline.  No pneumothorax or pleural effusion.  The right costophrenic angle scarring suspected.  Mild increased nodular opacity here compared to prior studies. Visualized tracheal air column is within normal limits.  No acute  osseous abnormality identified.  IMPRESSION: Mild patchy opacity at the right costophrenic angle might reflect resolving inflammation/acute chest syndrome.  Otherwise, stable chronic findings in the chest.   Original Report Authenticated By: Erskine Speed, M.D.    Dg Chest 2 View  02/16/2012  *RADIOLOGY REPORT*  Clinical Data: Sickle cell crisis with shortness of breath and body aches.  Low O2 sats.  CHEST - 2 VIEW  Comparison: 12/09/2011.  Findings: Trachea is midline.  Heart is mildly enlarged.  There is mild diffuse bilateral air space disease.  No pleural fluid.  IMPRESSION: Mild diffuse bilateral air space disease which may be due to edema or pneumonia.   Original Report Authenticated By: Leanna Battles, M.D.    US Renal  02/19/2012  *RADIOLOGY REPORT*  Clinical Data: Acute renal failure, history of sickle cell disease  RENAL/URINARY TRACT ULTRASOUND COMPLETE  Comparison:  None.  Findings:  Right Kidney:  Normal cortical thickness and size, measuring 12.0 cm in length. There is minimal increased echogenicity of the renal parenchyma.  No focal renal lesions.  No echogenic renal stones. No urinary obstruction.  Left Kidney:  Normal cortical thickness and size, measuring 14.0 cm in length. There is minimal increased echogenicity of the renal parenchyma.  No focal renal lesions.  No echogenic renal stones. No urinary obstruction.  Bladder:  Normal given degree of distension.  IMPRESSION: Mild increased echogenicity of the renal parenchyma, nonspecific but may be seen in the setting of medical renal disease.  No evidence of renal atrophy or urinary obstruction.   Original Report Authenticated By: Tacey Ruiz, MD    Ir Fluoro Guide Cv Line Right  02/20/2012  *RADIOLOGY REPORT*  Clinical Data: Patient with history of sickle cell crisis and poor venous access.  Central venous access is requested for fluids  and medications.  PICC LINE PLACEMENT WITH ULTRASOUND AND FLUOROSCOPIC  GUIDANCE  Fluoroscopy Time: 0.4  minutes.  The right arm was prepped with chlorhexidine, draped in the usual sterile fashion using maximum barrier technique (cap and mask, sterile gown, sterile gloves, large sterile sheet, hand hygiene and cutaneous antisepsis) and infiltrated locally with 1% Lidocaine.  Ultrasound demonstrated patency of the right brachial vein, and this was documented with an image.  Under real-time ultrasound guidance, this vein was accessed with a 21 gauge micropuncture needle and image documentation was performed.  The needle was exchanged over a guidewire for a peel-away sheath through which a five [place] double lumen PICC trimmed to 40 cm was advanced, positioned with its tip at the lower SVC/right atrial junction. Fluoroscopy during the procedure and fluoro spot radiograph confirms appropriate catheter position.  The catheter was flushed, secured to the skin with Prolene sutures, and covered with a sterile dressing.  Complications:  None  IMPRESSION: Successful right arm PICC line placement with ultrasound and fluoroscopic guidance.  The catheter is ready for use.  Read by: Jeananne Rama, P.A.-C   Original Report Authenticated By:  Tacey Ruiz, MD    Ir US Guide Vasc Access Right  02/20/2012  *RADIOLOGY REPORT*  Clinical Data: Patient with history of sickle cell crisis and poor venous access.  Central venous access is requested for fluids  and medications.  PICC LINE PLACEMENT WITH ULTRASOUND AND FLUOROSCOPIC  GUIDANCE  Fluoroscopy Time: 0.4 minutes.  The right arm was prepped with chlorhexidine, draped in the usual sterile fashion using maximum barrier technique (cap and mask, sterile gown, sterile gloves, large sterile sheet, hand hygiene and cutaneous antisepsis) and infiltrated locally with 1% Lidocaine.  Ultrasound demonstrated patency of the right brachial vein, and this was documented with an image.  Under real-time ultrasound guidance, this vein was accessed with a 21 gauge micropuncture needle and image  documentation was performed.  The needle was exchanged over a guidewire for a peel-away sheath through which a five [place] double lumen PICC trimmed to 40 cm was advanced, positioned with its tip at the lower SVC/right atrial junction. Fluoroscopy during the procedure and fluoro spot radiograph confirms appropriate catheter position.  The catheter was flushed, secured to the skin with Prolene sutures, and covered with a sterile dressing.  Complications:  None  IMPRESSION: Successful right arm PICC line placement with ultrasound and fluoroscopic guidance.  The catheter is ready for use.  Read by: Jeananne Rama, P.A.-C   Original Report Authenticated By: Tacey Ruiz, MD      ECHO:   Left ventricle: The cavity size was normal. There was moderate concentric hypertrophy. Systolic function was normal. The estimated ejection fraction was in the range of 55% to 60%. Left ventricular diastolic function parameters were normal. - Mitral valve: Mild regurgitation - 2 eccentric jets. There is filamentous material that moves with the posterior mitral leaflet which may represent a ruptured or flail cord. Consider TEE for better visualization. - Left atrium: Moderately dilated (area 25.7 cm2) - Right ventricle: The cavity size was mildly dilated. Wall thickness was normal. The moderator band was prominent. Systolic function was normal. - Right atrium: Moderately dilated (26.3 cm2). - Atrial septum: No defect or patent foramen ovale was identified. - Inferior vena cava: The vessel was dilated; the respirophasic diameter changes were blunted (< 50%); findings are consistent with elevated central venous pressure. - Pericardium, extracardiac: There was no pericardial effusion.     No results found for this or any previous visit (from the past 240 hour(s)).  BRIEF ADMITTING H & P: Swanson Farnell is a 42 y.o. male African American with a history of sickle cell anemia, diastolic CHF last 2-D echo  09/21/2007 with EF of 55-65%, chronic fatigue syndrome, gouty arthropathy, last hospitalization 12/07/2011 to 12/09/2011 4 sickle cell crisis who presents to the ED from his PCPs office with hypoxia with sats of 83% per ED report. Patient stated that went to see his PCP for followup appointment and he had started to develop a crisis with bilateral hip pain and knee pain 3 days prior to admission. Patient does endorse some shortness of breath on exertion when he and legs up or down stairs, subjective fevers, sneezing, bilateral hip and knee pain, constipation. Patient denies any chest pain, no nausea, no vomiting, no cough, no sore throat, no abdominal pain, no diarrhea, no dysuria, no weakness. Patient does endorse nocturia as well as congestion. Patient was seen in the ED with initial sats of 88% on room. Patient also noted to be in SVT with heart rate going up as high as 181 was given 6  mg of adenosine and would return to a sinus tachycardia with heart rate in the 90s to low 100s. CBC obtained showed a white count of 23.5 hemoglobin of 6.5. Chest x-ray was worrisome for pneumonia versus edema. One unit of packed red blood cells ordered to be transfused. Will call to admit the patient for further evaluation and management    Hospital Course:  Present on Admission:  . Acute renal failure: The patient developed acute kidney injury after 24 hours in the hospital. It appears that the patient had been having increased use of NSAIDs which was unrevealing at the time of admission. It is felt that the effect of NSAIDs, dehydration, antibiotics contributed to acute kidney injury. problem that has prolonged hospitalization has been the acute kidney injury. His creatinine went from a baseline of 1.16 and peaked at 1.66. At the time of discharge the patient's creatinine was continuing to decrease and was at 1.40 the patient was seen by nephrology in consultation who agreed with the management. And agreed that it was  likely secondary to It was  ATN secondary to multiple factors as noted above. There was some question as to whether or not intramedullary sickling could also contribute to this. Renal U/S showed findings consistent with medical renal disease but no hydronephrosis. The patient had been on Lasix early during his hospitalization. This was initially increased and then held. At the time of discharge the patient went home without any Lasix. He's to followup with his primary care physician for laboratory studies within the next week and a decision made about receiving any Lasix at this time.  . SVT (supraventricular tachycardia): On admission the patient was noted to have SVT with heart rates in the 150s to 190. He required adenosine conversion the emergency room. He continued to have intermittent sinus tachycardia was started on metoprolol. He was seen by cardiology who agreed with the Toprol. Toprol was titrated up to 25 mg twice a day. Of note the patient had been put on metoprolol by his primary care physician in the past and independently remove himself from the medication. The patient has been cautioned to continue taking this medication until adjusted by his physician.   . Sickle cell crisis: The patient initially came to the hospital with pain and elevated heart rate. During his hospitalization he required very little narcotic medication and was very quickly transitioned from IV medications or medication. The patient was discharged on Vicodin 5/325 one to 2 tabs by mouth every 4 hours when necessary for pain.   . Acute respiratory distress/Hypoxia: The patient presented in acute respiratory distress which was felt to be secondary to SVT as well as probably some pulmonary interstitial edema associated with the SVT. He was initially supported with oxygen and as the underlying conditions are resolved the patient was weaned off of oxygen. At time of discharge the patient required no further oxygen  supplementation. Of note during his evaluation the patient was found to have what appears to be some degree of interstitial lung disease. He was seen in consultation by Dr. Sherene Sires who recommends outpatient followup with pulmonology for further evaluation.  Marland Kitchen HCAP (healthcare-associated pneumonia): On presentation the patient had some fever. This along with elevated white blood cell count and findings on the chest x-ray that the treatment with broad-spectrum antibiotics including vancomycin, Zosyn and Levaquin. The spectrum was narrowed to just Levaquin the patient completed 8 days of antibiotics.  . Leukocytosis: The patient had a leukocytosis which was felt to be  secondary to acute inflammation associated with his vaso-occlusive crisis as well as the pneumonia. However despite resolution of the acute vaso-occlusive episode and treatment of pneumonia the patient had a persistent elevated white blood cell count of did not appear to be associated with infectious process. Recommendations the patient had his CBC reevaluated when he is in a convalescent state evaluate for any ongoing elevation of his white blood cell count.   . Acute chest syndrome(517.3): PT was treated for acute chest syndrome on admission, however given clinical course, it is likely that his clinical presentation was all secondary to pulmonary edema secondary to mitral regurgitation and SVT.  Marland Kitchen Mitral Regurgitation: ECHO showed mitral regurgitation with some valve abnormality. HE was evaluated by cardiology who have recommended follow-up echo in the future.  . Hyperkalemia: The patient developed acute hyperkalemia at the time of the acute kidney injury. He was treated emergently with calcium carbonate, Kayexalate and glucose and insulin. The time of discharge the patient's potassium levels were within normal limits.   . Anemia: Patient has a baseline hemoglobin of 6.0 by his report. However at initial presentation the patient was hypoxemic.  He was transfused 2 units of packed red blood cells to improve his oxygen-carrying capacity. At the time of discharge the hemoglobin is 7.3.      Disposition and Follow-up:  Patient is discharged in good condition. He's to followup with his primary care physician within one week.   DISCHARGE EXAM:  General: Alert, awake, oriented x3, in no acute distress. Well appearing  Vital Signs: BP 110/56, HR 68, T 98.7 F (37.1 C), temperature source Oral, RR 18, height 5\' 10"  (1.778 m), weight 80.2 kg (176 lb 12.9 oz), SpO2 96.00%. HEENT: [place]/AT PEERL, EOMI, Anicteric  OROPHARYNX: Moist, No exudate/ erythema/lesions.  Heart: Regular rate and rhythm, without murmurs, rubs, gallops.  Lungs: Crackles noted throughout lung fields. No wheezing no rales.  Abdomen: Soft, nontender, nondistended, positive bowel sounds, no masses no hepatosplenomegaly noted..  Neuro: No focal neurological deficits noted cranial nerves II through XII grossly intact.  Musculoskeletal: No warm swelling or erythema around joints, no spinal tenderness noted.    Basename 02/27/12 0558 02/26/12 0500 02/25/12 0500  NA 140 140 --  K 4.9 4.6 --  CL 106 105 --  CO2 27 27 --  GLUCOSE 95 89 --  BUN 29* 33* --  CREATININE 1.40* 1.60* --  CALCIUM 8.5 8.4 --  MG -- -- --  PHOS -- 4.8* 5.2*    Basename 02/26/12 0500 02/25/12 0500  AST -- 123*  ALT -- 39  ALKPHOS -- 101  BILITOT -- 5.8*  PROT -- 7.7  ALBUMIN 2.8* 2.9*2.9*   No results found for this basename: LIPASE:2,AMYLASE:2 in the last 72 hours  Basename 02/27/12 0558 02/26/12 0500 02/25/12 0505  WBC 14.9* 16.3* --  NEUTROABS -- -- 8.7*  HGB 7.3* 7.4* --  HCT 20.9* 20.6* --  MCV 92.5 89.6 --  PLT 270 276 --   Total time for discharge including decision-making face-to-face time is greater than 30 minutes  Signed: MATTHEWS,MICHELLE A. 02/27/2012, 8:21 AM

## 2012-02-28 DIAGNOSIS — E559 Vitamin D deficiency, unspecified: Secondary | ICD-10-CM | POA: Insufficient documentation

## 2012-06-17 ENCOUNTER — Encounter: Payer: Self-pay | Admitting: Internal Medicine

## 2012-06-21 ENCOUNTER — Encounter: Payer: Self-pay | Admitting: Internal Medicine

## 2012-06-21 ENCOUNTER — Telehealth: Payer: Self-pay | Admitting: *Deleted

## 2012-06-21 ENCOUNTER — Ambulatory Visit (INDEPENDENT_AMBULATORY_CARE_PROVIDER_SITE_OTHER): Payer: BC Managed Care – PPO | Admitting: Internal Medicine

## 2012-06-21 VITALS — BP 111/62 | HR 91 | Temp 98.3°F | Ht 71.0 in | Wt 183.0 lb

## 2012-06-21 DIAGNOSIS — Z609 Problem related to social environment, unspecified: Secondary | ICD-10-CM

## 2012-06-21 DIAGNOSIS — K117 Disturbances of salivary secretion: Secondary | ICD-10-CM

## 2012-06-21 DIAGNOSIS — Z604 Social exclusion and rejection: Secondary | ICD-10-CM

## 2012-06-21 DIAGNOSIS — F411 Generalized anxiety disorder: Secondary | ICD-10-CM

## 2012-06-21 DIAGNOSIS — R002 Palpitations: Secondary | ICD-10-CM

## 2012-06-21 DIAGNOSIS — H9319 Tinnitus, unspecified ear: Secondary | ICD-10-CM

## 2012-06-21 DIAGNOSIS — D5700 Hb-SS disease with crisis, unspecified: Secondary | ICD-10-CM | POA: Insufficient documentation

## 2012-06-21 DIAGNOSIS — R269 Unspecified abnormalities of gait and mobility: Secondary | ICD-10-CM

## 2012-06-21 LAB — COMPREHENSIVE METABOLIC PANEL
Albumin: 3.6 g/dL (ref 3.5–5.2)
Alkaline Phosphatase: 118 U/L — ABNORMAL HIGH (ref 39–117)
BUN: 28 mg/dL — ABNORMAL HIGH (ref 6–23)
Calcium: 8.7 mg/dL (ref 8.4–10.5)
Chloride: 112 mEq/L (ref 96–112)
Glucose, Bld: 113 mg/dL — ABNORMAL HIGH (ref 70–99)
Potassium: 5.7 mEq/L — ABNORMAL HIGH (ref 3.5–5.3)

## 2012-06-21 MED ORDER — HYDROXYUREA 500 MG PO CAPS: 1000.0000 mg | ORAL_CAPSULE | Freq: Every day | ORAL | Status: DC

## 2012-06-21 MED ORDER — HYDROXYUREA 100 MG/ML ORAL SUSPENSION: 250.0000 mg | Freq: Every day | ORAL | Status: DC

## 2012-06-21 MED ORDER — ALPRAZOLAM 0.25 MG PO TABS: 0.2500 mg | ORAL_TABLET | Freq: Three times a day (TID) | ORAL | Status: DC | PRN

## 2012-06-21 MED ORDER — DEFERASIROX 500 MG PO TBSO: 20.0000 mg/kg | ORAL_TABLET | Freq: Every day | ORAL | Status: DC

## 2012-06-21 MED ORDER — FOLIC ACID 1 MG PO TABS: 1.0000 mg | ORAL_TABLET | Freq: Every day | ORAL | Status: DC

## 2012-06-21 NOTE — Progress Notes (Addendum)
Subjective:     Patient ID: [name], male   DOB: 1970/02/19, 42 y.o.   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 The patient presents today to establish care as an outpatient and also to the laboratory data that was collected approximately one month ago. The patient states that at the time of the lateral he was in acute vaso-occlusive crisis which she manages home with oral medications. He states that since then he has had intermittent days of increased pain which is managed well with a short acting medications. He has no complaints of pain increased from his baseline today. The patient states that his normal pain tolerance as an intensity of 4/10. He states that he is compliant with his methadone 10 mg by mouth 3 times a day and only uses the Dilaudid when he has a flareup of pain above his baseline. He feels that his pain is managed well by these medications.  The patient denies any shortness of breath. He does have a new symptom of tinnitus and gait abnormality. 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 His last eye examination was within the last year. The patient is not on hydroxyurea and has not been on it due to his fear of the side effects of the medication nor is he on folic acid. He also has a known elevated ferritin level (3295) but is not on Exjade at this time.  The patient also has a complaint of tinnitus this started approximately one month ago. He states that this has been associated with a laterality gait and feelings of disequilibrium with standing and walking. He states that after walking for some time he feels that the disequilibrium improves however if he is still noted in laterality of gait. He has had no change in  vision and no perception of change in strength. He did note that he had 2 episodes of drooling while looking at television the last one occurring at work approximately 2 weeks ago.  The patient also reveals that he's had feelings of depression social isolation which have been present for 25 years. He states that he's never felt safe enough in a medical setting to reveal this. He states that he is able to concentrate at work and function well as he does not have to speak with people face-to-face. He states however that he is unable to interact socially with activity such as going to lunch with his colleagues or interacting with his roommates at home. He states that he sometimes has feelings of paranoia associated with his anxiety. He however denies any visual or auditory hallucinations, telescoping, suicidal or homicidal deviations.  The patient also complains of periodic feelings of heat intolerance and palpitations. He does have a history of SVT which is treated with metoprolol. The patient admits that he has not been fully compliant with taking his metoprolol as scheduled and notices that he has palpitations when not being compliant with metoprolol.  Patient has a history of osteoarthritis of the right knee for which you see Dr. Dion Saucier of orthopedic surgery. The patient had been put on meloxicam 50 mg daily. He states that the pain has not subsided and that she stopped taking on meloxicam approximately 3 days ago.  The patient did have a history of  acute renal failure during his last hospitalization however your laboratory studies shows that his renal function is back to baseline of creatinine of 0.99 with a GFR of greater than 90%.   Review of Systems  Constitutional: Negative.   HENT: Positive for tinnitus.   Eyes: Negative.   Respiratory: Negative.   Cardiovascular: Positive for palpitations.  Gastrointestinal: Negative.   Endocrine: Positive for heat intolerance.  Genitourinary: Negative.    Musculoskeletal: Positive for arthralgias (Right Knee).  Skin: Negative.   Allergic/Immunologic: Negative.   Neurological: Positive for dizziness, tremors and light-headedness.       Gait abnormality  Psychiatric/Behavioral: Positive for sleep disturbance. Negative for suicidal ideas, hallucinations, behavioral problems, confusion, self-injury and decreased concentration. The patient is nervous/anxious.        Social Isolation and emotional lability       Objective:   Physical Exam  Constitutional: He appears well-developed and well-nourished.  HENT:  Head: Normocephalic and atraumatic.  Eyes: Conjunctivae and EOM are normal. Pupils are equal, round, and reactive to light. No scleral icterus.  Neck: Normal range of motion. Neck supple. No JVD present.  Cardiovascular: Normal rate and regular rhythm.  Exam reveals no gallop and no friction rub.   No murmur heard. Pulmonary/Chest: Effort normal and breath sounds normal.  Abdominal: Soft. Bowel sounds are normal.  Musculoskeletal: Normal range of motion.  Neurological: Coordination (Positive tandem gait and Rhomberg,.drifting gait. No pass pointing, No adiadokokinesis, ) abnormal.       Assessment:     1. Hemoglobin SS with crisis: The laboratory data that was received from the last time is due to. Crisis. The patient had an elevated white blood cell count as well as an elevated bilirubin at the time. The patient is in a convalescent state today and I will repeat his laboratory studies including a CBC with differential, complete metabolic profile, reticulocyte, ferritin. The patient also will be started on folic acid 1 mg daily, Exjade 1500 mg by mouth daily as well as hydroxyurea 1250 mg by mouth daily. The patient is well-controlled on his current medications and does not require prescription for his analgesic medications today.  2. 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 The patient was started on Xanax 0.25 mg by mouth 3 times a day when necessary anxiety. I'm also refer him to psychiatry as his anxiety he is accompanied by features of social isolation and paranoia which may speak to diagnoses of agoraphobia. I've also ordered a TSH to be checked today.  3. Social isolation: See above  4. 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 He also has lateralization of gait accompanied by the tinnitus and has had 2 episodes of uncontrolled drooling and has also described periods of being emotionally labile. The patient is at risk for stroke having the disease of hemoglobin SS. Thus I will get an MRI of the brain to eliminate the diagnosis of CVA. Once asleep he has been eliminated as the diagnosis, if the patient is still having the symptoms will consider sending him to therapy for vestibular training.  5. Leukocytosis: The patient had an elevated white blood cell count and his last laboratory studies at 18.0. This could quite possibly have been secondary to an acute crisis to repeat his  laboratory studies CBC with differential today and make further recommendations based upon the results.  6. History of gout: The patient is chronically on allopurinol. He states that approximately one month ago he stopped his allopurinol and noted that he was having a flare of his gout. He resumed his allopurinol and has since had no further symptoms.  7. History of SVT: The patient admits that he's been somewhat not adherent to taking his metoprolol. He has noted palpitations when he's been off the Toprol. The patient has been counseled to the be compliant with his medications in order to prevent symptoms that increases his risk.  8. History of right knee pain: The patient was seen by  orthopedic surgery and was prescribed meloxicam. He states that since taking the meloxicam his knee pain has resolved and examined out he did associated with knee pain has also resolved. From this point forward he will take his meloxicam on an as-needed basis.  9. Health maintenance: I will defer general health maintenance to the next visit at which time we will address issues of cholesterol.    Plan:     See above: Labs: CBC with differential, complete metabolic profile, ferritin, reticulocyte, TSH. Referrals: Psychiatry Diagnostic studies: MRI brain without contrast Return to clinic: One month for 30 minute visit with M.D. Future labs: CBC with differential in 2 weeks for surveillance of initiating hydroxyurea.  Total time spent with patient 50 minutes

## 2012-06-21 NOTE — Telephone Encounter (Signed)
Pt scheduled for MRI Brain w/o contrast on 06/29/2012 @ 3:45 @ [facility] ([address] [place], [phone]).  Pre-cert RCVD from [facility] [place] Numbers # is [id number] exp June 17th, 2014.  Pt advised of appt date & time.

## 2012-06-21 NOTE — Addendum Note (Signed)
Addended by: Marthann Schiller A on: 06/21/2012 01:31 PM   Modules accepted: Orders

## 2012-06-21 NOTE — Progress Notes (Signed)
Patient: [name] DOB :1970-09-25 MRN :[id number]  Date: 06/21/2012  Documentation Initiated by : Jefm Miles  Subjective/Objective Assessment: [name] is a 42 year old male with Hb SS/SCD. This patient was seen in the office today to establish care with Dr. Marthann Schiller being his primary care physician. This CM spoke with patient per Dr. Ashley Royalty request regarding mental health counseling for anxiety. [name] received prescription for Xanax on this visit and agreed to participate in counseling.     Barriers to Care: Need for mental health counseling Prior Approval (PA) #: NA PA start date: NA PA end date: NA   Action/Plan: This CM explained her role at the [facility], which patient is receptive to. This CM faxed a referral to [facility]. This CM will continue to follow.    Comments: [name] will contact this CM if he has any additional needs getting his medications.  Karoline Caldwell RN, BSN, [place] [phone]

## 2012-06-22 ENCOUNTER — Encounter (HOSPITAL_COMMUNITY): Payer: Self-pay | Admitting: Hematology

## 2012-06-22 ENCOUNTER — Non-Acute Institutional Stay (HOSPITAL_COMMUNITY)
Admission: AD | Admit: 2012-06-22 | Discharge: 2012-06-23 | Disposition: A | Discharge status: Home / Self Care | Payer: BC Managed Care – PPO | Source: Ambulatory Visit | Attending: Internal Medicine | Admitting: Internal Medicine

## 2012-06-22 ENCOUNTER — Telehealth: Payer: Self-pay | Admitting: Internal Medicine

## 2012-06-22 DIAGNOSIS — D5700 Hb-SS disease with crisis, unspecified: Secondary | ICD-10-CM | POA: Insufficient documentation

## 2012-06-22 DIAGNOSIS — D571 Sickle-cell disease without crisis: Secondary | ICD-10-CM

## 2012-06-22 DIAGNOSIS — D72829 Elevated white blood cell count, unspecified: Secondary | ICD-10-CM | POA: Insufficient documentation

## 2012-06-22 DIAGNOSIS — D649 Anemia, unspecified: Secondary | ICD-10-CM | POA: Diagnosis present

## 2012-06-22 DIAGNOSIS — R9431 Abnormal electrocardiogram [ECG] [EKG]: Secondary | ICD-10-CM | POA: Insufficient documentation

## 2012-06-22 DIAGNOSIS — D6489 Other specified anemias: Secondary | ICD-10-CM

## 2012-06-22 DIAGNOSIS — E875 Hyperkalemia: Secondary | ICD-10-CM

## 2012-06-22 LAB — CBC WITH DIFFERENTIAL/PLATELET
Basophils Relative: 1 % (ref 0–1)
Blasts: 0 %
Eosinophils Absolute: 0.9 10*3/uL — ABNORMAL HIGH (ref 0.0–0.7)
Eosinophils Relative: 5 % (ref 0–5)
HCT: 17.4 % — ABNORMAL LOW (ref 39.0–52.0)
Hemoglobin: 6.2 g/dL — CL (ref 13.0–17.0)
MCH: 33 pg (ref 26.0–34.0)
MCH: 34.8 pg — ABNORMAL HIGH (ref 26.0–34.0)
MCHC: 35.6 g/dL (ref 30.0–36.0)
MCV: 92.6 fL (ref 78.0–100.0)
MCV: 93.3 fL (ref 78.0–100.0)
Metamyelocytes Relative: 0 %
Monocytes Absolute: 1.6 10*3/uL — ABNORMAL HIGH (ref 0.1–1.0)
Monocytes Relative: 8 % (ref 3–12)
Myelocytes: 0 %
Neutro Abs: 10.9 10*3/uL — ABNORMAL HIGH (ref 1.7–7.7)
Platelets: 238 10*3/uL (ref 150–400)
RDW: 30.9 % — ABNORMAL HIGH (ref 11.5–15.5)
nRBC: 24 /100 WBC — ABNORMAL HIGH

## 2012-06-22 LAB — COMPREHENSIVE METABOLIC PANEL
CO2: 21 mEq/L (ref 19–32)
Calcium: 8.2 mg/dL — ABNORMAL LOW (ref 8.4–10.5)
Creatinine, Ser: 1.33 mg/dL (ref 0.50–1.35)
GFR calc Af Amer: 75 mL/min — ABNORMAL LOW (ref >90)
GFR calc non Af Amer: 65 mL/min — ABNORMAL LOW (ref >90)
Glucose, Bld: 87 mg/dL (ref 70–99)

## 2012-06-22 LAB — RETICULOCYTES
RBC.: 1.88 MIL/uL — ABNORMAL LOW (ref 4.22–5.81)
Retic Ct Pct: 19.4 % — ABNORMAL HIGH (ref 0.4–2.3)

## 2012-06-22 MED ORDER — SODIUM POLYSTYRENE SULFONATE 15 GM/60ML PO SUSP
30.0000 g | Freq: Once | ORAL | Status: AC
  Administered 2012-06-22: 30 g via ORAL
  Filled 2012-06-22: qty 120

## 2012-06-22 MED ORDER — METOPROLOL TARTRATE 25 MG PO TABS
50.0000 mg | ORAL_TABLET | Freq: Two times a day (BID) | ORAL | Status: DC
  Administered 2012-06-23: 50 mg via ORAL
  Filled 2012-06-22: qty 2

## 2012-06-22 MED ORDER — SODIUM CHLORIDE 0.9 % IV BOLUS (SEPSIS)
1000.0000 mL | Freq: Once | INTRAVENOUS | Status: AC
  Administered 2012-06-22 (×2): 500 mL via INTRAVENOUS

## 2012-06-22 MED ORDER — FOLIC ACID 1 MG PO TABS
1.0000 mg | ORAL_TABLET | Freq: Every day | ORAL | Status: DC
  Administered 2012-06-23: 1 mg via ORAL
  Filled 2012-06-22: qty 1

## 2012-06-22 MED ORDER — FENTANYL 25 MCG/HR TD PT72
25.0000 ug | MEDICATED_PATCH | TRANSDERMAL | Status: DC
  Administered 2012-06-22: 25 ug via TRANSDERMAL
  Filled 2012-06-22: qty 1

## 2012-06-22 MED ORDER — METHADONE HCL 10 MG PO TABS: 10.0000 mg | ORAL_TABLET | Freq: Three times a day (TID) | ORAL | Status: DC

## 2012-06-22 MED ORDER — ALPRAZOLAM 0.25 MG PO TABS: 0.2500 mg | ORAL_TABLET | Freq: Three times a day (TID) | ORAL | Status: DC | PRN

## 2012-06-22 MED ORDER — HYDROMORPHONE HCL PF 2 MG/ML IJ SOLN
1.0000 mg | INTRAMUSCULAR | Status: DC | PRN
  Administered 2012-06-22 – 2012-06-23 (×6): 1 mg via INTRAVENOUS
  Filled 2012-06-22 (×6): qty 1

## 2012-06-22 MED ORDER — FENTANYL 25 MCG/HR TD PT72: 1.0000 | MEDICATED_PATCH | TRANSDERMAL | Status: DC

## 2012-06-22 NOTE — Telephone Encounter (Signed)
Spoke with pt, informed of lab results and advised him to come into center for additional labs and possible hydration and transfusion.  Pt is adamant that he cannot miss work and that he feels fine, advised pt we would give him a work excuse but pt wishes to wait until he gets off work this afternoon (he states around 5:30) that he will go straight to the ED.

## 2012-06-22 NOTE — Telephone Encounter (Signed)
Pt seen in clinic yesterday and evaluated including lab studies which showed: 1. Hyperkalemia- Potassium 5.7 2. Anemia- Hb 6.2  Pt was not clinically dehydrated yesterday and no good explanation for laboratory abnormality. He could be on the end of an acute VOC and in need of hydration as well as transfusion in light of need for hydrea.   Will call patient to be admitted to [facility] for evaluation including  labs: BMET, LDH, T&C and for possible hydration and transfusion. Pt was called and was not reached. A message was left on his phone. Nurse to continue to try to reach patient.  Chanan Detwiler A.

## 2012-06-22 NOTE — H&P (Addendum)
Hospital Admission Note Date: 06/22/2012  Patient name: [name] Medical record number: [id number] Date of birth: August 08, 1970 Age: 42 y.o. Gender: male PCP: [name] Luckman A., MD  Attending physician: [name] Harm, MD  Chief Complaint:  History of Present Illness: [name] is a very nice gentleman with hemoglobin SS who was seen in clinic yesterday for comprehensive visit. At that time the patient indicated that he felt he had had a recent crisis which he managed at home with oral medication. Lab tests were obtained and  the patient was found to have a hemoglobin of 6.2. However  more significantly was a potassium 5.7 in total bilirubin of 10.2 which indicates significant hemolysis. I asked the patient to come in to the [facility] cell disease Center for repeat  labs and management of anemia and acute crisis.   Upon arrival the patient indicates that he actually felt like he was in crisis last night and is mildly icteric in appearance today. He states that his pain in his usual sickle cell pain and he rates it at 6/10 mostly localized to his back and legs.   Allergies: Review of patient's allergies indicates no known allergies. Past Medical History  Diagnosis Date  . CHF (congestive heart failure)   . Gouty arthropathy   . Chronic fatigue syndrome   . Sickle cell anemia   . Anxiety   . Depression    Past Surgical History  Procedure Laterality Date  . Appendectomy    . Cholecystectomy    . Splenectomy     Family History  Problem Relation Age of Onset  . Sickle cell anemia    . Sickle cell trait Mother    History   Social History  . Marital Status: Single    Spouse Name: N/A    Number of Children: N/A  . Years of Education: N/A   Occupational History  . Not on file.   Social History Main Topics  . Smoking status: Never Smoker   . Smokeless tobacco: Not on file  . Alcohol Use: No  . Drug Use: No  . Sexually Active: No   Other Topics Concern  . Not on  file   Social History Narrative   Works in a Technical sales engineer.  Estranged from his biological father.   Review of Systems: A comprehensive review of systems was negative except as noted in history of present illness. Physical Exam: No intake or output data in the 24 hours ending 06/22/12 1208 General: Alert, awake, oriented x3, in no acute distress.  HEENT: [place]/AT PEERL, EOMI, mild icterus Heart: Regular rate and rhythm, without murmurs, rubs, gallops.  Lungs: Clear to auscultation, no wheezing or rhonchi noted.  Abdomen: Soft, nontender, nondistended, positive bowel sounds, no masses no hepatosplenomegaly noted..  Neuro: No focal neurological deficits noted cranial nerves II through XII grossly intact. Strength functional in bilateral upper and lower extremities. Musculoskeletal: No warm swelling or erythema around joints, no spinal tenderness noted. Psychiatric: Patient alert and oriented x3, good insight and cognition, good recent to remote recall.   Lab results:  Recent Labs  06/21/12 1113  NA 139  K 5.7*  CL 112  CO2 22  GLUCOSE 113*  BUN 28*  CREATININE 1.29  CALCIUM 8.7  MG 2.3    Recent Labs  06/21/12 1113  AST 138*  ALT 47  ALKPHOS 118*  BILITOT 10.2*  PROT 7.3  ALBUMIN 3.6   No results found for this basename: LIPASE, AMYLASE,  in the last 72 hours  Recent Labs  06/21/12 1113  WBC 17.6*  NEUTROABS 10.9*  HGB 6.2*  HCT 17.4*  MCV 92.6  PLT 258   No results found for this basename: CKTOTAL, CKMB, CKMBINDEX, TROPONINI,  in the last 72 hours No components found with this basename: POCBNP,  No results found for this basename: DDIMER,  in the last 72 hours No results found for this basename: HGBA1C,  in the last 72 hours No results found for this basename: CHOL, HDL, LDLCALC, TRIG, CHOLHDL, LDLDIRECT,  in the last 72 hours  Recent Labs  06/21/12 1148  TSH 1.139    Recent Labs  06/21/12 1113  FERRITIN 3489*  RETICCTPCT 19.4*   Imaging results:  No  results found. Other results: EKG: normal EKG, normal sinus rhythm, unchanged from previous tracings, prolonged QT interval.   Assessment and Plan:  1. Hyperkalemia: The patient has a mild hyperkalemia which is likely secondary to hemolytic process with leaking of potassium from the red blood cells. I will hydrate the patient and repeat his laboratory studies to assess for his potassium levels. I suspect that they will be back to normal range with hydration. If however the potassium is further elevated then the patient will have to be admitted for further management of the hyperkalemia. The patient had a twelve-lead EKG done that showed no peak T waves or EKG changes associated with hyperkalemia. It did however show a prolongation of his QT 493 which is significant in light of the patient being on methadone.  2. Anemia: The patient currently has a hemoglobin of 6.2 which is lower than his normal. In addition the patient has been started on hydroxyurea which is now being held. In light of the patient's recent vaso-occlusive episodes and evidence of elevated bilirubin which likely represents hemolysis I will transfuse the patient is one unit of packed blood cells. However I am awaiting the pending hemoglobin level from this morning and make a decision as to whether the patient will get 1 or 2 units of blood.  3. Hemoglobin SS with crisis: Patient does admit to symptoms in the last 24 hours of acute vaso-occlusive episode. Furthermore his laboratory studies are consistent with acute hemolysis. The patient will be treated with serial doses of IV Dilaudid with attempts to decrease his pain. He be given IV hydration. In light of the prolonged QTc will speak with patient about changing his chronic long back medication to an alternative.  4. Prolonged QTc: Pt taking Methadone and with prolonged QTc. He reports taht he's taking methadone only 1 x day. Will discontinue Methadone and treat with Fentanyl 25 mcg/hr  and reassess for titration.  Time spent 45 minutes  [name] A. 06/22/2012, 12:08 PM  Addenedum- 3:29pm  Labs reviewed and potassium decreased to 5.4. Will give a dose of Kayexalate and recheck after patient has had a BM.  [name] A.

## 2012-06-22 NOTE — Telephone Encounter (Signed)
Attempted to reach patient again. Reached voicemail.

## 2012-06-22 NOTE — Telephone Encounter (Signed)
Left message on voicemail for patient to call office regarding labwork results.Per Dr. Ashley Royalty patient needs to come to [facility] for hydration or go to the ED.

## 2012-06-22 NOTE — Telephone Encounter (Signed)
Per [name], pt called back and has now decided to come into Center, he states he is on his way, day hospital staff informed.

## 2012-06-23 LAB — HEMOGLOBIN AND HEMATOCRIT, BLOOD: Hemoglobin: 6.1 g/dL — CL (ref 13.0–17.0)

## 2012-06-23 LAB — POTASSIUM: Potassium: 5.1 mEq/L (ref 3.5–5.1)

## 2012-06-23 NOTE — Discharge Summary (Signed)
[facility] Discharge Summary   Patient ID: [name] MRN: [id number] DOB/AGE: September 16, 1970 42 y.o.  Admit date: 06/22/2012 Discharge date: 06/23/2012  Primary Care Physician:  [name] Weare A., MD  Admission Diagnoses:  Active Problems:   Leukocytosis   Hyperkalemia   Anemia   Hb-SS disease with crisis   Prolonged Q-T interval on ECG   Discharge Diagnoses:   Leukocytosis   Hyperkalemia   Anemia   Hb-SS disease with crisis   Prolonged Q-T interval on ECG  Discharge Medications:    Medication List    STOP taking these medications       HYDROmorphone 4 MG tablet  Commonly known as:  DILAUDID     hydroxyurea 100 mg/mL Susp  Commonly known as:  HYDREA     hydroxyurea 500 MG capsule  Commonly known as:  HYDREA     meloxicam 15 MG tablet  Commonly known as:  MOBIC     methadone 10 MG tablet  Commonly known as:  DOLOPHINE      TAKE these medications       allopurinol 300 MG tablet  Commonly known as:  ZYLOPRIM     ALPRAZolam 0.25 MG tablet  Commonly known as:  XANAX  Take 1 tablet (0.25 mg total) by mouth 3 (three) times daily as needed for anxiety.     deferasirox 500 MG disintegrating tablet  Commonly known as:  EXJADE  Take 3 tablets (1,500 mg total) by mouth daily before breakfast.     fentaNYL 25 MCG/HR  Commonly known as:  DURAGESIC - dosed mcg/hr  Place 1 patch (25 mcg total) onto the skin every 3 (three) days.     folic acid 1 MG tablet  Commonly known as:  FOLVITE  Take 1 tablet (1 mg total) by mouth daily.     HYDROcodone-acetaminophen 5-325 MG per tablet  Commonly known as:  NORCO/VICODIN  Take 1-2 tablets by mouth every 4 (four) hours as needed for pain.     metoprolol 50 MG tablet  Commonly known as:  LOPRESSOR  Take 1 tablet (50 mg total) by mouth 2 (two) times daily.     sodium chloride 0.65 % nasal spray  Commonly known as:  OCEAN  Place 1 spray into the nose as needed. For nasal congestion     zolpidem 10  MG tablet  Commonly known as:  AMBIEN  Take 10 mg by mouth at bedtime as needed. For sleep.         Consults:    Significant Diagnostic Studies:  No results found.   [facility] Course:  For complete details please refer to admission H and P, but in brief, [name] has hemoglobin SS who was seen in clinic yesterday for comprehensive establish care visit. During this visit he  indicated that he felt he had  a recent crisis which he managed at home with oral medication. Lab  test hemoglobin of 6.2. And  potassium 5.7 in total bilirubin of 10.2 which indicates significant hemolysis. He was admitted the [facility] to  repeat labs and management of anemia and VOC. Treatment include aggressive IV hydration, pain management for 1. VOC. 2. 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 In addition the patient has been started on hydroxyurea which is now being held. He will return to the [facility] on Tuesdays for repeat labs. After results are reviewed it will be to determine  to  resume hydroxyurea or continue to place on hold  2. Hyperkalemia probable  secondary to hemolytic process with leaking of potassium from the red blood cells was treated with kayexalate. 3.  Acute on chronic pain discontinued  methadone change to fentanyl for prolong Q-T intervals on EKG .  Dr. Ashley Royalty prescribed Fentanyl 25 mcg/hr to transition off methadone.  Physical Exam at Discharge:  BP 114/48  Pulse 79  Temp(Src) 99.3 F (37.4 C) (Oral)  Resp 18  SpO2 92%  Gen: Alert, awake, oriented x3, in no acute distress Cardiovascular: Regular rate and rhythm, without murmurs, rubs, gallops Respiratory:Clear to auscultation, no wheezing or rhonchi noted. No vocal fremitus  Gastrointestinal:Soft, nontender, nondistended, positive bowel sounds Extremities: No warm swelling or erythema around joints, no spinal tenderness noted.  Disposition at  Discharge: 01-Home or Self Care  Discharge Orders:      Future Appointments Provider Department Dept Phone   06/29/2012 4:15 PM [id number] AT [address] [phone]   Patient to arrive 15 minutes prior to appointment time.   07/05/2012 8:30 AM [facility] [place] [facility] [phone]   07/22/2012 1:15 PM Altha Harm, MD [place] [facility] [phone]      Condition at Discharge:   Stable  Time spent on Discharge:  Greater than 30 minutes.  SignedGrayce Sessions 06/23/2012, 12:48 PM

## 2012-06-23 NOTE — Progress Notes (Signed)
Call to Dr. Craige Cotta the results of his labs, she stated she will hold off on the second unit until about noon.

## 2012-06-23 NOTE — Progress Notes (Signed)
Pt given discharge instructions and all questions answered; pt alert and oriented, states pain level is 2/10 upon discharge; Peripheral IV removed with no complications noted

## 2012-06-23 NOTE — Progress Notes (Signed)
MD notified E. Galla Hgb 6.1 and potassium 5.1 post 1 unit PRBC received and bowel movement x2. Order given to transfuse 1 Unit PRBC. To continue to monitor.

## 2012-06-23 NOTE — Progress Notes (Signed)
Completed first unit of blood at 2115, but had not had any stools from [facility], stated he feels like he's going stomach churning, but wanted to wait a while longer. Instructed we needed to draw blood after a couple of hours post transfusion, he stated his blood usually take longer to increase his Hgb.

## 2012-06-24 LAB — TYPE AND SCREEN: Antibody Screen: NEGATIVE

## 2012-06-29 ENCOUNTER — Other Ambulatory Visit: Payer: BC Managed Care – PPO

## 2012-06-29 ENCOUNTER — Inpatient Hospital Stay
Admission: RE | Admit: 2012-06-29 | Discharge: 2012-06-29 | Disposition: A | Discharge status: Home / Self Care | Payer: BC Managed Care – PPO | Source: Ambulatory Visit | Attending: Internal Medicine | Admitting: Internal Medicine

## 2012-06-30 ENCOUNTER — Ambulatory Visit: Payer: BC Managed Care – PPO | Admitting: Internal Medicine

## 2012-07-02 ENCOUNTER — Telehealth: Payer: Self-pay | Admitting: Internal Medicine

## 2012-07-05 ENCOUNTER — Other Ambulatory Visit: Payer: BC Managed Care – PPO

## 2012-07-05 ENCOUNTER — Ambulatory Visit (INDEPENDENT_AMBULATORY_CARE_PROVIDER_SITE_OTHER): Payer: BC Managed Care – PPO | Admitting: Internal Medicine

## 2012-07-05 ENCOUNTER — Other Ambulatory Visit: Payer: Self-pay | Admitting: Internal Medicine

## 2012-07-05 ENCOUNTER — Non-Acute Institutional Stay (HOSPITAL_COMMUNITY)
Admission: AD | Admit: 2012-07-05 | Discharge: 2012-07-05 | Disposition: A | Discharge status: Home / Self Care | Payer: BC Managed Care – PPO | Attending: Internal Medicine | Admitting: Internal Medicine

## 2012-07-05 ENCOUNTER — Encounter: Payer: Self-pay | Admitting: Internal Medicine

## 2012-07-05 VITALS — BP 128/51 | HR 85

## 2012-07-05 DIAGNOSIS — D571 Sickle-cell disease without crisis: Secondary | ICD-10-CM | POA: Insufficient documentation

## 2012-07-05 DIAGNOSIS — R9431 Abnormal electrocardiogram [ECG] [EKG]: Secondary | ICD-10-CM

## 2012-07-05 DIAGNOSIS — Z79899 Other long term (current) drug therapy: Secondary | ICD-10-CM | POA: Insufficient documentation

## 2012-07-05 DIAGNOSIS — Z9119 Patient's noncompliance with other medical treatment and regimen: Secondary | ICD-10-CM

## 2012-07-05 DIAGNOSIS — D6489 Other specified anemias: Secondary | ICD-10-CM

## 2012-07-05 MED ORDER — HYDROMORPHONE HCL 4 MG PO TABS: 4.0000 mg | ORAL_TABLET | ORAL | Status: DC | PRN

## 2012-07-05 MED ORDER — ZOLPIDEM TARTRATE 10 MG PO TABS: 10.0000 mg | ORAL_TABLET | Freq: Every evening | ORAL | Status: DC | PRN

## 2012-07-05 NOTE — Progress Notes (Signed)
Case Management Note: This CM was notified by Virgel Paling, RN regarding patient unable to get his medication (hydroxyurea). This CM called patient he stated today was not a good time to talk because he is unsure about taking hydroxyurea. [name] will call this CM back later once he is able to think about changing his medications.   Karoline Caldwell, RN, BSN, [place] [phone]

## 2012-07-05 NOTE — Telephone Encounter (Signed)
These Rx were renewed & printed by Dr. Ashley Royalty today, pt has picked them up at his appt for labs.

## 2012-07-05 NOTE — Progress Notes (Signed)
Patient ID: [name], male   DOB: November 27, 1970, 42 y.o.   MRN: [id number] Pt here for follow up on effectiveness of Fentanyl and also to check labs with regard to starting Hydrea.    Pt reports that he has not started to take Hydrea. He also stated that he has not started using the Fentanyl patches as directed although he has filled the prescription, and has continued to use the Methadone despite instructions against it due to prolonged QTc. He states that the Fentanyl is not fast acting enough when he is in crisis and he states that he has been using the Hydromorphone about every 4 hours. Pt expresses that he has worked very hard to be productive in life and that his medication regimen has been working well for him for 41 years. He is afraid that a change in his medication regimen for his symptom management will result in a derailment of his life due to increased symptoms. I explained to the patient that I understood his concerns and that the reason for a change in his long acting medications was a prolonged QTc which in itself created it's own risk up to and including sudden death.  Pt states that he has been feeling well since his last visit when he received blood. He denies any palpitations or fast heartbeats,N/V/D, fever, chills or headaches. He states that he has pain localized to his back which he rates as 3-4/10 and manageable to allow normal function.  ROS: all systems negative except as noted in the HPI.  Focused examination: General: Pt well appearing. Vital Signs: VSS HEENT: Mild icterus, PERRLA, EOMI Lungs: CTA, no wheezing or rales present COR: S1 S2 normal.  ABD: Soft, NT/ND no masses, no hepatoslenomegaly. PSYCH: Pt with normal affect, good recent and remote recall, stable mood.  Plan:  1. Hb SS: Pt is to obtain his hydrea and start taking today at the prescribed dose of 1250 mg daily. Pt also to start exjade 1500 mg daily at bedtime. I will obtain a 12 lead EKG. I also  instructed patient to stop the Methadone and place the Fentanyl transdermally as previously prescribed. Also refilled prescription for  Dilaudid 4 mg #90 pills (15 day supply)  2. Medication Non-compliance: See above  2. Prolonged OTc: On his lasts visit he was noted to have a prolonged QTc while on Methadone. This however was in the setting of elevated potassium. Will obtain 12 lead EKD and evaluate. If QTc WNL and patient still feels that Fentanyl not effective, will consider giving another trial of Methadone with close EKG surveillance as this regimen has been effective and has allowed patient to be a productive.  Labs: CBC with diff and Type and Cross Follow up: RTC in 2 weeks for 15 minute visit with MD  Total time 30 minutes. Greater than 50 % of time spent in discussion and counseling about medication compliance and risk and benefit.

## 2012-07-05 NOTE — Progress Notes (Addendum)
Patient ID: [name], male   DOB: 1970-05-21, 42 y.o.   MRN: [id number] Patient diagnosed with sickle cell disease, in for type & cross.  Labs drawn, blood band applied, and patient instructed to leave in place until he hears back from [place].  EKG perfromed on patient without difficulty.

## 2012-07-06 LAB — CBC WITH DIFFERENTIAL/PLATELET
Basophils Absolute: 0.2 10*3/uL — ABNORMAL HIGH (ref 0.0–0.1)
Basophils Relative: 1 % (ref 0–1)
Eosinophils Absolute: 0.8 10*3/uL — ABNORMAL HIGH (ref 0.0–0.7)
Eosinophils Relative: 5 % (ref 0–5)
Lymphocytes Relative: 37 % (ref 12–46)
MCH: 31.9 pg (ref 26.0–34.0)
MCHC: 33.8 g/dL (ref 30.0–36.0)
MCV: 94.5 fL (ref 78.0–100.0)
Platelets: 409 10*3/uL — ABNORMAL HIGH (ref 150–400)
RDW: 22.4 % — ABNORMAL HIGH (ref 11.5–15.5)
WBC: 15.4 10*3/uL — ABNORMAL HIGH (ref 4.0–10.5)

## 2012-07-08 NOTE — Discharge Summary (Signed)
Pt seen and examined. Discussed prolonged QTc and Methadone with patient and NP. Agree with assessment and plan.

## 2012-07-09 ENCOUNTER — Other Ambulatory Visit: Payer: Self-pay | Admitting: Internal Medicine

## 2012-07-09 DIAGNOSIS — D5700 Hb-SS disease with crisis, unspecified: Secondary | ICD-10-CM

## 2012-07-09 LAB — TYPE AND SCREEN

## 2012-07-09 NOTE — Progress Notes (Signed)
Case Management Note: This CM spoke with [name] at [facility] to advise Mr. Pincus Badder has an appointment with counselor Dr. Onalee Hua on Thursday 07/15/12.  Karoline Caldwell RN, BSN, [place]  [phone]

## 2012-07-12 ENCOUNTER — Telehealth: Payer: Self-pay | Admitting: *Deleted

## 2012-07-12 ENCOUNTER — Ambulatory Visit
Admission: RE | Admit: 2012-07-12 | Discharge: 2012-07-12 | Disposition: A | Discharge status: Home / Self Care | Payer: BC Managed Care – PPO | Source: Ambulatory Visit | Attending: Internal Medicine | Admitting: Internal Medicine

## 2012-07-12 DIAGNOSIS — H9319 Tinnitus, unspecified ear: Secondary | ICD-10-CM

## 2012-07-12 DIAGNOSIS — K117 Disturbances of salivary secretion: Secondary | ICD-10-CM

## 2012-07-12 DIAGNOSIS — R269 Unspecified abnormalities of gait and mobility: Secondary | ICD-10-CM

## 2012-07-12 NOTE — Telephone Encounter (Signed)
Pt previously dropped off FMLA forms to be completed by Dr. Ashley Royalty.  Forms have been completed and faxed to company.  Pt informed and faxed copies to pt for his records.

## 2012-07-22 ENCOUNTER — Encounter: Payer: Self-pay | Admitting: Internal Medicine

## 2012-07-22 ENCOUNTER — Ambulatory Visit (HOSPITAL_COMMUNITY)
Admission: AD | Admit: 2012-07-22 | Discharge: 2012-07-22 | Disposition: A | Discharge status: Home / Self Care | Payer: BC Managed Care – PPO | Source: Ambulatory Visit | Attending: Internal Medicine | Admitting: Internal Medicine

## 2012-07-22 ENCOUNTER — Ambulatory Visit (INDEPENDENT_AMBULATORY_CARE_PROVIDER_SITE_OTHER): Payer: BC Managed Care – PPO | Admitting: Internal Medicine

## 2012-07-22 VITALS — BP 107/48 | HR 79 | Temp 98.5°F | Resp 18 | Ht 70.0 in | Wt 186.0 lb

## 2012-07-22 DIAGNOSIS — IMO0001 Reserved for inherently not codable concepts without codable children: Secondary | ICD-10-CM | POA: Insufficient documentation

## 2012-07-22 DIAGNOSIS — D571 Sickle-cell disease without crisis: Secondary | ICD-10-CM | POA: Insufficient documentation

## 2012-07-22 DIAGNOSIS — R9431 Abnormal electrocardiogram [ECG] [EKG]: Secondary | ICD-10-CM | POA: Insufficient documentation

## 2012-07-22 DIAGNOSIS — G894 Chronic pain syndrome: Secondary | ICD-10-CM

## 2012-07-22 DIAGNOSIS — Z79899 Other long term (current) drug therapy: Secondary | ICD-10-CM | POA: Insufficient documentation

## 2012-07-22 NOTE — Progress Notes (Signed)
  Subjective:    Patient ID: [name], male    DOB: 03/09/1970, 42 y.o.   MRN: [id number]  HPI: Pt here to follow up on Hb SS. Pt states that he has not been using Fentanyl patches but has been taking Methadone 10 mg once daily with dilaudid for rescue doses. He endorses compliance with Hydrea and folic acid.    Review of Systems  Constitutional: Negative.   HENT: Negative.   Eyes: Negative.   Respiratory: Negative.   Cardiovascular: Negative.   Gastrointestinal: Negative.   Endocrine: Negative.   Genitourinary: Negative.   Musculoskeletal: Positive for myalgias (pain 3-4/10 occasionally requiring Dilaudid fro rescue). Negative for arthralgias.  Skin: Negative.   Allergic/Immunologic: Negative.   Neurological: Negative.   Hematological: Negative.   Psychiatric/Behavioral: Negative.        Objective:   Physical Exam  Constitutional: He is oriented to person, place, and time. He appears well-developed and well-nourished. No distress.  HENT:  Head: Atraumatic.  Eyes: Conjunctivae and EOM are normal. Pupils are equal, round, and reactive to light. No scleral icterus.  Neck: Normal range of motion. Neck supple.  Cardiovascular: Normal rate and regular rhythm.  Exam reveals no gallop and no friction rub.   No murmur heard. Pulmonary/Chest: Effort normal and breath sounds normal. He has no wheezes. He has no rales. He exhibits no tenderness.  Abdominal: Soft. Bowel sounds are normal. He exhibits no distension and no mass. There is no tenderness.  Musculoskeletal: Normal range of motion.  Neurological: He is alert and oriented to person, place, and time.  Skin: Skin is warm and dry.  Psychiatric: He has a normal mood and affect. His behavior is normal. Judgment and thought content normal.          Assessment & Plan:  1. Hb SS: Patient has been compliant with hydroxyurea and folic acid. He was supposed to be on 1250 mg daily however he has been unable to obtain the liquid  formulation this is been taking 1000 mg daily. He's to continue on folic acid. Review of 2-D echocardiogram done on 02/17/2012 shows no evidence of tricuspid regurgitation. 12-lead EKG shows an improved QTC of 483 ms. The patient will continue on his current dose of methadone 10 mg by mouth daily and Dilaudid 4 mg every 4 hours when necessary rescue medications. We will obtain laboratory data on the patient today to evaluate the indices as the patient states that at the time of the last labs he was not consistently taking his hydroxyurea.  2. Prolonged QTc. 12-lead EKG today shows QTC of 486 ms. Will continue on current dose of methadone and repeat twelve-lead EKG on a monthly basis.  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 Case manager will try to work with assistance program.  Followup: RTC in one month for 30 minute visit with M.D.  Total time for the visit today 25 minutes. Greater than 50% of the time spent in discussion and counseling around use of hydroxyurea folic acid, methadone use versus other long-acting medications.

## 2012-07-22 NOTE — Procedures (Signed)
[facility]  Procedure Note  [name] WUJ:[id number] DOB: [birth year] DOA: 07/22/2012   PCP: MATTHEWS,MICHELLE A., MD   Associated Diagnosis: Hemoglobin SS disease  Procedure Note: labs drawn per order   Condition During Procedure: Patient tolerated the procedure well without difficulty or complaints   Condition at Discharge: Patient stable at discharge.  Lanae Boast, RN  [facility]

## 2012-07-22 NOTE — Care Management (Signed)
Patient: [name] DOB :1971/01/18 MRN :[id number]  Date: 07/22/2012  Documentation Initiated by : Jefm Miles  Subjective/Objective Assessment: Mr. [name] is a 42 year old male with known SCD. Mr. [name] stated he has not seen [facility], due to location and personal hesitation. Mr. [name] works in [place]. Mr. [name] agreed to meet the counselor at his next office appointment on 08/24/12 at 4:00pm.   Barriers to Care: Mr. [name] need to connect with a mental health counselor  Prior Approval (PA) #: NA PA start date: NA PA end date:  NA  Action/Plan: This CM will coordinate availability of [facility]  Mr.Sadler's next MD appointment on  08/24/12 at 4:00pm.   Comments: None  Time spent: 25 mins.  Karoline Caldwell, RN, BSN, [place]    [phone]

## 2012-07-23 LAB — CBC WITH DIFFERENTIAL/PLATELET
Eosinophils Absolute: 1 10*3/uL — ABNORMAL HIGH (ref 0.0–0.7)
Eosinophils Relative: 6 % — ABNORMAL HIGH (ref 0–5)
Lymphocytes Relative: 52 % — ABNORMAL HIGH (ref 12–46)
Monocytes Absolute: 1.3 10*3/uL — ABNORMAL HIGH (ref 0.1–1.0)
Monocytes Relative: 8 % (ref 3–12)
Neutro Abs: 5.5 10*3/uL (ref 1.7–7.7)
Neutrophils Relative %: 30 % — ABNORMAL LOW (ref 43–77)
Platelets: 335 10*3/uL (ref 150–400)
RBC: 2.08 MIL/uL — ABNORMAL LOW (ref 4.22–5.81)
WBC: 16.2 10*3/uL — ABNORMAL HIGH (ref 4.0–10.5)
nRBC: 6 /100 WBC — ABNORMAL HIGH

## 2012-07-23 LAB — COMPREHENSIVE METABOLIC PANEL
AST: 115 U/L — ABNORMAL HIGH (ref 0–37)
Alkaline Phosphatase: 165 U/L — ABNORMAL HIGH (ref 39–117)
BUN: 21 mg/dL (ref 6–23)
CO2: 22 mEq/L (ref 19–32)
Chloride: 107 mEq/L (ref 96–112)
Creatinine, Ser: 1.28 mg/dL (ref 0.50–1.35)
GFR calc non Af Amer: 68 mL/min — ABNORMAL LOW (ref >90)
Total Bilirubin: 6.5 mg/dL — ABNORMAL HIGH (ref 0.3–1.2)

## 2012-07-23 LAB — RETICULOCYTES
RBC.: 2.08 MIL/uL — ABNORMAL LOW (ref 4.22–5.81)
Retic Ct Pct: 21.6 % — ABNORMAL HIGH (ref 0.4–3.1)

## 2012-07-26 ENCOUNTER — Telehealth: Payer: Self-pay | Admitting: Hematology

## 2012-07-26 NOTE — Telephone Encounter (Signed)
Pt called asking RN lots of questions regarding his new prescription for the medication Exjade.  Wanting to know the reason for the med and what his past ferritin levels were.  Asking about other drug interactions as well.  RN told pt the reason the medication is ordered and its purpose.  Reasons for elevated ferritiin levels were reviewed with pt.  Told pt writer would speak to Dr Ashley Royalty about the urgency of starting this medication and will call him back

## 2012-07-26 NOTE — Telephone Encounter (Signed)
Spoke with Dr Ashley Royalty and she stated that the pat can hold off on filling the prescription until he speaks with her at his next appt in July.  RN called [name] and told him to hold off on filling the EXjade until he speaks with Dr Ashley Royalty with any questions and concerns about the need for the drug.  Pt was happy with this response, he is to see Dr Ashley Royalty August 24 2012

## 2012-07-28 ENCOUNTER — Encounter (HOSPITAL_COMMUNITY): Payer: Self-pay | Admitting: Internal Medicine

## 2012-08-03 ENCOUNTER — Telehealth: Payer: Self-pay | Admitting: Hematology

## 2012-08-03 NOTE — Telephone Encounter (Signed)
Pharmacy requesting refill on xanax 0.25mg  po TID PRN

## 2012-08-05 ENCOUNTER — Other Ambulatory Visit: Payer: Self-pay | Admitting: Internal Medicine

## 2012-08-09 ENCOUNTER — Telehealth: Payer: Self-pay | Admitting: Hematology

## 2012-08-09 DIAGNOSIS — F411 Generalized anxiety disorder: Secondary | ICD-10-CM

## 2012-08-09 DIAGNOSIS — Z604 Social exclusion and rejection: Secondary | ICD-10-CM

## 2012-08-09 DIAGNOSIS — D5700 Hb-SS disease with crisis, unspecified: Secondary | ICD-10-CM

## 2012-08-09 MED ORDER — ALPRAZOLAM 0.25 MG PO TABS: 0.2500 mg | ORAL_TABLET | Freq: Three times a day (TID) | ORAL | Status: DC | PRN

## 2012-08-09 MED ORDER — ZOLPIDEM TARTRATE 10 MG PO TABS: 10.0000 mg | ORAL_TABLET | Freq: Every evening | ORAL | Status: DC | PRN

## 2012-08-09 NOTE — Telephone Encounter (Signed)
Refilled xanax .25 tid prn # 90 given /19/14 and ambrein # 30 07/05/12

## 2012-08-10 ENCOUNTER — Telehealth: Payer: Self-pay | Admitting: Internal Medicine

## 2012-08-10 ENCOUNTER — Telehealth: Payer: Self-pay | Admitting: Hematology

## 2012-08-10 NOTE — Telephone Encounter (Signed)
Pt requesting that I fax his prescriptions to [facility] on [address].  Will do now, told pt to call the pharmacy in an hour to see if they received the orders.  If not the pt will come by tomorrow to pick them up.

## 2012-08-10 NOTE — Telephone Encounter (Signed)
Called pt to tell him scripts are available for pick up for ambien and xanax

## 2012-08-10 NOTE — Telephone Encounter (Signed)
Pt was concerned that he developed a small nickel sized rash to his upper chest area that was red and raised, pt also stated that he noticed blood on his pillow in the morning that most likely came from his mouth.  States he does not see any open sores in his mouth.  Pt thought that these side effects were from starting hydrea 1 month ago. Asked NP Gwinda Passe about these side effects, and she stated that she did not feel that they are related to his hydrea that he started 1 month ago.  Left message to reassure pt that everything is ok, rash is not a concern as it is going away on its own., and that we will be doing lab work at his next appointment July 22nd.

## 2012-08-18 ENCOUNTER — Encounter: Payer: Self-pay | Admitting: Internal Medicine

## 2012-08-18 ENCOUNTER — Encounter (HOSPITAL_COMMUNITY): Payer: Self-pay | Admitting: Internal Medicine

## 2012-08-24 ENCOUNTER — Ambulatory Visit (INDEPENDENT_AMBULATORY_CARE_PROVIDER_SITE_OTHER): Payer: BC Managed Care – PPO | Admitting: Primary Care

## 2012-08-24 ENCOUNTER — Inpatient Hospital Stay (HOSPITAL_COMMUNITY)
Admission: AD | Admit: 2012-08-24 | Discharge: 2012-08-29 | DRG: 395 | Disposition: A | Discharge status: Home / Self Care | Payer: BC Managed Care – PPO | Source: Ambulatory Visit | Attending: Internal Medicine | Admitting: Internal Medicine

## 2012-08-24 ENCOUNTER — Encounter: Payer: Self-pay | Admitting: Internal Medicine

## 2012-08-24 VITALS — BP 107/52 | HR 65 | Temp 98.3°F | Wt 178.0 lb

## 2012-08-24 DIAGNOSIS — G8929 Other chronic pain: Secondary | ICD-10-CM | POA: Diagnosis present

## 2012-08-24 DIAGNOSIS — R52 Pain, unspecified: Secondary | ICD-10-CM | POA: Diagnosis present

## 2012-08-24 DIAGNOSIS — E875 Hyperkalemia: Secondary | ICD-10-CM | POA: Diagnosis present

## 2012-08-24 DIAGNOSIS — D72829 Elevated white blood cell count, unspecified: Secondary | ICD-10-CM | POA: Diagnosis present

## 2012-08-24 DIAGNOSIS — G4700 Insomnia, unspecified: Secondary | ICD-10-CM | POA: Diagnosis present

## 2012-08-24 DIAGNOSIS — R17 Unspecified jaundice: Secondary | ICD-10-CM | POA: Diagnosis present

## 2012-08-24 DIAGNOSIS — D57819 Other sickle-cell disorders with crisis, unspecified: Secondary | ICD-10-CM

## 2012-08-24 DIAGNOSIS — D5700 Hb-SS disease with crisis, unspecified: Principal | ICD-10-CM | POA: Diagnosis present

## 2012-08-24 DIAGNOSIS — F411 Generalized anxiety disorder: Secondary | ICD-10-CM | POA: Diagnosis present

## 2012-08-24 DIAGNOSIS — E876 Hypokalemia: Secondary | ICD-10-CM | POA: Diagnosis not present

## 2012-08-24 LAB — COMPREHENSIVE METABOLIC PANEL
ALT: 57 U/L — ABNORMAL HIGH (ref 0–53)
AST: 102 U/L — ABNORMAL HIGH (ref 0–37)
Albumin: 3.8 g/dL (ref 3.5–5.2)
Alkaline Phosphatase: 159 U/L — ABNORMAL HIGH (ref 39–117)
BUN: 24 mg/dL — ABNORMAL HIGH (ref 6–23)
Chloride: 104 mEq/L (ref 96–112)
Potassium: 5.5 mEq/L — ABNORMAL HIGH (ref 3.5–5.1)
Sodium: 135 mEq/L (ref 135–145)
Total Bilirubin: 5.6 mg/dL — ABNORMAL HIGH (ref 0.3–1.2)
Total Protein: 7.7 g/dL (ref 6.0–8.3)

## 2012-08-24 LAB — CBC WITH DIFFERENTIAL/PLATELET
Eosinophils Absolute: 0.5 10*3/uL (ref 0.0–0.7)
Hemoglobin: 7.3 g/dL — ABNORMAL LOW (ref 13.0–17.0)
Lymphs Abs: 5.5 10*3/uL — ABNORMAL HIGH (ref 0.7–4.0)
MCH: 39.9 pg — ABNORMAL HIGH (ref 26.0–34.0)
MCV: 109.3 fL — ABNORMAL HIGH (ref 78.0–100.0)
Monocytes Relative: 8 % (ref 3–12)
Neutrophils Relative %: 39 % — ABNORMAL LOW (ref 43–77)
RBC: 1.83 MIL/uL — ABNORMAL LOW (ref 4.22–5.81)

## 2012-08-24 LAB — RETICULOCYTES: RBC.: 1.83 MIL/uL — ABNORMAL LOW (ref 4.22–5.81)

## 2012-08-24 MED ORDER — SODIUM CHLORIDE 0.9 % IV BOLUS (SEPSIS)
250.0000 mL | Freq: Once | INTRAVENOUS | Status: AC
  Administered 2012-08-24: 250 mL via INTRAVENOUS

## 2012-08-24 MED ORDER — ALPRAZOLAM 0.25 MG PO TABS: 0.2500 mg | ORAL_TABLET | Freq: Three times a day (TID) | ORAL | Status: DC | PRN

## 2012-08-24 MED ORDER — DIPHENHYDRAMINE HCL 25 MG PO CAPS: 25.0000 mg | ORAL_CAPSULE | ORAL | Status: DC | PRN

## 2012-08-24 MED ORDER — FOLIC ACID 1 MG PO TABS: 1.0000 mg | ORAL_TABLET | Freq: Every day | ORAL | Status: DC

## 2012-08-24 MED ORDER — DEXTROSE-NACL 5-0.45 % IV SOLN
INTRAVENOUS | Status: DC
  Administered 2012-08-24: 1000 mL via INTRAVENOUS
  Administered 2012-08-25: 14:00:00 via INTRAVENOUS
  Administered 2012-08-25: 1000 mL via INTRAVENOUS
  Administered 2012-08-26: 11:00:00 via INTRAVENOUS
  Administered 2012-08-26: 1000 mL via INTRAVENOUS
  Administered 2012-08-27: 19:00:00 via INTRAVENOUS
  Administered 2012-08-27: 1000 mL via INTRAVENOUS
  Administered 2012-08-28 – 2012-08-29 (×3): via INTRAVENOUS

## 2012-08-24 MED ORDER — ENOXAPARIN SODIUM 40 MG/0.4ML ~~LOC~~ SOLN
40.0000 mg | SUBCUTANEOUS | Status: DC
  Administered 2012-08-24 – 2012-08-28 (×5): 40 mg via SUBCUTANEOUS
  Filled 2012-08-24 (×6): qty 0.4

## 2012-08-24 MED ORDER — DIPHENHYDRAMINE HCL 25 MG PO CAPS
25.0000 mg | ORAL_CAPSULE | Freq: Once | ORAL | Status: AC
  Administered 2012-08-27: 25 mg via ORAL
  Filled 2012-08-24 (×2): qty 1

## 2012-08-24 MED ORDER — DIPHENHYDRAMINE HCL 50 MG/ML IJ SOLN
12.5000 mg | INTRAMUSCULAR | Status: DC | PRN
  Administered 2012-08-24 – 2012-08-26 (×3): 25 mg via INTRAVENOUS
  Filled 2012-08-24 (×3): qty 1

## 2012-08-24 MED ORDER — ACETAMINOPHEN 325 MG PO TABS
650.0000 mg | ORAL_TABLET | Freq: Once | ORAL | Status: AC
  Administered 2012-08-27: 650 mg via ORAL
  Filled 2012-08-24 (×2): qty 2

## 2012-08-24 MED ORDER — KETOROLAC TROMETHAMINE 30 MG/ML IJ SOLN
30.0000 mg | Freq: Four times a day (QID) | INTRAMUSCULAR | Status: DC
  Filled 2012-08-24 (×19): qty 1

## 2012-08-24 MED ORDER — METHADONE HCL 10 MG PO TABS
10.0000 mg | ORAL_TABLET | Freq: Every day | ORAL | Status: DC
  Administered 2012-08-25 – 2012-08-29 (×5): 10 mg via ORAL
  Filled 2012-08-24 (×5): qty 1

## 2012-08-24 MED ORDER — HYDROXYUREA 500 MG PO CAPS
1000.0000 mg | ORAL_CAPSULE | Freq: Every day | ORAL | Status: DC
  Administered 2012-08-25 – 2012-08-29 (×5): 1000 mg via ORAL
  Filled 2012-08-24 (×5): qty 2

## 2012-08-24 MED ORDER — HYDROMORPHONE HCL PF 2 MG/ML IJ SOLN
2.0000 mg | INTRAMUSCULAR | Status: DC | PRN
  Administered 2012-08-25: 2 mg via INTRAVENOUS
  Administered 2012-08-25: 4 mg via INTRAVENOUS
  Administered 2012-08-25: 2 mg via INTRAVENOUS
  Administered 2012-08-25 (×2): 4 mg via INTRAVENOUS
  Administered 2012-08-25: 2 mg via INTRAVENOUS
  Administered 2012-08-26 – 2012-08-27 (×8): 4 mg via INTRAVENOUS
  Administered 2012-08-27: 2 mg via INTRAVENOUS
  Administered 2012-08-27 – 2012-08-28 (×6): 4 mg via INTRAVENOUS
  Administered 2012-08-28: 2 mg via INTRAVENOUS
  Administered 2012-08-28 – 2012-08-29 (×5): 4 mg via INTRAVENOUS
  Filled 2012-08-24: qty 1
  Filled 2012-08-24 (×3): qty 2
  Filled 2012-08-24: qty 1
  Filled 2012-08-24 (×9): qty 2
  Filled 2012-08-24: qty 1
  Filled 2012-08-24 (×11): qty 2
  Filled 2012-08-24: qty 1

## 2012-08-24 MED ORDER — DEXTROSE 5 % IV SOLN
2000.0000 mg | Freq: Every day | INTRAVENOUS | Status: DC
  Administered 2012-08-24 – 2012-08-28 (×5): 2000 mg via INTRAVENOUS
  Filled 2012-08-24 (×6): qty 2

## 2012-08-24 MED ORDER — HYDROMORPHONE HCL PF 2 MG/ML IJ SOLN: 4.0000 mg | Freq: Once | INTRAMUSCULAR | Status: AC | PRN

## 2012-08-24 MED ORDER — FOLIC ACID 1 MG PO TABS
1.0000 mg | ORAL_TABLET | Freq: Every day | ORAL | Status: DC
  Administered 2012-08-25 – 2012-08-29 (×5): 1 mg via ORAL
  Filled 2012-08-24 (×6): qty 1

## 2012-08-24 MED ORDER — METOPROLOL TARTRATE 50 MG PO TABS
50.0000 mg | ORAL_TABLET | Freq: Two times a day (BID) | ORAL | Status: DC
  Administered 2012-08-25 – 2012-08-29 (×7): 50 mg via ORAL
  Filled 2012-08-24 (×11): qty 1

## 2012-08-24 MED ORDER — HYDROMORPHONE HCL PF 2 MG/ML IJ SOLN
4.0000 mg | Freq: Once | INTRAMUSCULAR | Status: AC
  Administered 2012-08-24: 4 mg via INTRAVENOUS

## 2012-08-24 MED ORDER — SODIUM POLYSTYRENE SULFONATE 15 GM/60ML PO SUSP
30.0000 g | Freq: Once | ORAL | Status: AC
  Administered 2012-08-24: 30 g via ORAL
  Filled 2012-08-24: qty 120

## 2012-08-24 MED ORDER — ZOLPIDEM TARTRATE 10 MG PO TABS: 10.0000 mg | ORAL_TABLET | Freq: Every evening | ORAL | Status: DC | PRN

## 2012-08-24 NOTE — Procedures (Signed)
[facility]  Procedure Note  [name] FAO:[id number] DOB: 07-Feb-1970 DOA: 08/24/2012   PCP: MATTHEWS,MICHELLE A., MD   Associated Diagnosis: Sickle Cell Disease  Procedure Note: Lab Blood Draw   Condition During Procedure: Tolerated Well   Condition at Discharge: No complaints, patient in no apparent distress. Left ambulatory with belongings.    Allyn Kenner, RN  [facility]

## 2012-08-24 NOTE — Progress Notes (Signed)
Patient transferred to 1314 per NP order. Report given to floor RN. VSS. Transferred patient with belongings via wheelchair escorted by RN.

## 2012-08-24 NOTE — Progress Notes (Signed)
Patient returned to the clinic to have labs redrawn.  Patient then C/O pain to knees that is 4/10.  Patient states that he was on his way to the ED for treatment.  I advised Marcelino Duster, NP and she will see the patient.

## 2012-08-24 NOTE — H&P (Signed)
Sickle Cell  History and Physical   Date: 08/24/2012  Patient name: [name] Medical record number: [id number] Date of birth: 1970/11/14 Age: 42 y.o. Gender: male PCP: MATTHEWS,Lamere Lightner A., MD  Attending physician: Rometta Emery, MD  Chief Complaint: bilateral knee pain   History of Present Illness: [name] is a 42 y/o [place] American male with SS genotype SCD. PMH of CHF, gouty arthropathy, anxiety, and depression. He was initially seen during an office visit and during this time he admitted to feeling tired weak and increase joint pain for approximately 2 weeks and trying to avoid admission. Pt was asked to return to clinic to repeat labs r/t tube clotted. Pt was actually returning to hosp to go to the  ED because he was feeling worst. At this time admitted to the [facility] for  aggressive treatment of  symptoms with IVF bolus and a dose of analgesic. Lab reviewed elevated potassium transferred to 23 hr observation for further management of vaso occlusive crisis with active hemolysis and hyperkalemia.   Meds: No prescriptions prior to admission    Allergies: Review of patient's allergies indicates no known allergies. Past Medical History  Diagnosis Date  . CHF (congestive heart failure)   . Gouty arthropathy   . Chronic fatigue syndrome   . Sickle cell anemia   . Anxiety   . Depression    Past Surgical History  Procedure Laterality Date  . Appendectomy    . Cholecystectomy    . Splenectomy     Family History  Problem Relation Age of Onset  . Sickle cell anemia    . Sickle cell trait Mother    History   Social History  . Marital Status: Single    Spouse Name: N/A    Number of Children: N/A  . Years of Education: N/A   Occupational History  . Not on file.   Social History Main Topics  . Smoking status: Never Smoker   . Smokeless tobacco: Not on file  . Alcohol Use: No  . Drug Use: No  . Sexually Active: No   Other Topics Concern  . 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  Estranged from his biological father.    Review of Systems: A comprehensive review of systems was negative except for: Eyes: positive for icterus, and arthralgia    Physical Exam: Blood pressure 117/55, pulse 75, temperature 99 F (37.2 C), temperature source Oral, resp. rate 18, SpO2 97.00%. BP 117/55  Pulse 75  Temp(Src) 99 F (37.2 C) (Oral)  Resp 18  SpO2 97%  General Appearance:    Alert, cooperative, no distress, appears stated age  Head:    Normocephalic, without obvious abnormality, atraumatic  Eyes:    PERRL, conjunctiva/corneas clear, EOM's intact, moderate icteric sclera   Ears:    Normal TM's and external ear canals, both ears  Nose:   Nares normal, septum midline, mucosa normal, no drainage    or sinus tenderness  Throat:   Lips, mucosa, and tongue normal; teeth and gums normal  Neck:   Supple, symmetrical, trachea midline, no adenopathy;       thyroid:  No enlargement/tenderness/nodules; no carotid   bruit or JVD  Back:     Symmetric, no curvature, ROM normal, no CVA tenderness  Lungs:     Clear to auscultation bilaterally, respirations unlabored  Chest wall:    No tenderness or deformity  Heart:    Regular rate and rhythm,  S1 and S2 normal, no murmur, rub   or gallop  Abdomen:     Soft, non-tender, bowel sounds active all four quadrants,     Genitalia:  Deferred   Rectal:  Deferred   Extremities:   Extremities normal, atraumatic, no cyanosis or edema  Pulses:   2+ and symmetric all extremities  Skin:   Skin color, texture, turgor normal, resolving rashes or lesions  Lymph nodes:   Cervical, supraclavicular, and axillary nodes normal  Neurologic:   CNII-XII intact. Normal strength, sensation and reflexes      throughout    Lab results: Results for orders placed during the hospital encounter of 08/24/12 (from the past 24 hour(s))  COMPREHENSIVE METABOLIC PANEL     Status: Abnormal   Collection Time    08/24/12   5:12 PM      Result Value Range   Sodium 135  135 - 145 mEq/L   Potassium 5.5 (*) 3.5 - 5.1 mEq/L   Chloride 104  96 - 112 mEq/L   CO2 23  19 - 32 mEq/L   Glucose, Bld 84  70 - 99 mg/dL   BUN 24 (*) 6 - 23 mg/dL   Creatinine, Ser 1.61  0.50 - 1.35 mg/dL   Calcium 9.0  8.4 - 09.6 mg/dL   Total Protein 7.7  6.0 - 8.3 g/dL   Albumin 3.8  3.5 - 5.2 g/dL   AST 045 (*) 0 - 37 U/L   ALT 57 (*) 0 - 53 U/L   Alkaline Phosphatase 159 (*) 39 - 117 U/L   Total Bilirubin 5.6 (*) 0.3 - 1.2 mg/dL   GFR calc non Af Amer 82 (*) >90 mL/min   GFR calc Af Amer >90  >90 mL/min  PREPARE RBC (CROSSMATCH)     Status: None   Collection Time    08/24/12  5:30 PM      Result Value Range   Order Confirmation ORDER PROCESSED BY BLOOD BANK    TYPE AND SCREEN     Status: None   Collection Time    08/24/12  5:40 PM      Result Value Range   ABO/RH(D) A POS     Antibody Screen NEG     Sample Expiration 08/27/2012     Unit Number [id number]     Blood Component Type RED CELLS,LR     Unit division 00     Status of Unit ALLOCATED     Transfusion Status OK TO TRANSFUSE     Crossmatch Result Compatible    RETICULOCYTES     Status: Abnormal   Collection Time    08/24/12  6:59 PM      Result Value Range   Retic Ct Pct 30.1 (*) 0.4 - 3.1 %   RBC. 1.83 (*) 4.22 - 5.81 MIL/uL   Retic Count, Manual 550.8 (*) 19.0 - 186.0 K/uL  CBC WITH DIFFERENTIAL     Status: Abnormal   Collection Time    08/24/12  6:59 PM      Result Value Range   WBC 11.7 (*) 4.0 - 10.5 K/uL   RBC 1.83 (*) 4.22 - 5.81 MIL/uL   Hemoglobin 7.3 (*) 13.0 - 17.0 g/dL   HCT 56.2 (*) 13.0 - 86.5 %   MCV 109.3 (*) 78.0 - 100.0 fL   MCH 39.9 (*) 26.0 - 34.0 pg   MCHC 36.5 (*) 30.0 - 36.0 g/dL   RDW 78.4 (*) 69.6 - 29.5 %   Platelets  276  150 - 400 K/uL   Neutrophils Relative % 39 (*) 43 - 77 %   Neutro Abs 4.6  1.7 - 7.7 K/uL   Lymphocytes Relative 47 (*) 12 - 46 %   Lymphs Abs 5.5 (*) 0.7 - 4.0 K/uL   Monocytes Relative 8  3 - 12 %    Monocytes Absolute 1.0  0.1 - 1.0 K/uL   Eosinophils Relative 4  0 - 5 %   Eosinophils Absolute 0.5  0.0 - 0.7 K/uL   Basophils Relative 1  0 - 1 %   Basophils Absolute 0.1  0.0 - 0.1 K/uL   WBC Morphology ATYPICAL LYMPHOCYTES     RBC Morphology POLYCHROMASIA PRESENT      Imaging results:  No results found.   Assessment & Plan:  1. Vaso occlusive crisis with active hemolysis: type and cross and 1 unit of RBC held to be reviewed for exchange transfusion in AM. Analgesic IV Q 2 hrs prn 2-4 mg, antiemetic and anti pruritic  prn from analgic.  Continue maintenance medication folic acid and hydroyxurea.  2. Acute on chronic pain:  methadone resumed  3. 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 Anxiety/dpression: resume home medication   6. Hyperkalemia : kayexalate once repeat labs in AM 7. Insomnia: prn Ambien home dose resumed    Haylyn Halberg P 08/24/2012, 8:48 PM

## 2012-08-24 NOTE — Progress Notes (Signed)
Lab called and stated that the blood sample that was sent down was now clotted, and the CBC with Diff and reticulocyte test could not be ran.  Marcelino Duster, NP notified via telephone.

## 2012-08-24 NOTE — Progress Notes (Signed)
SICKLE CE[facility] SERVICE PROGRESS NOTE Patient ID: [name], male   DOB: [birth year], 42 y.o.   MRN: [id number]  PCP: [name] Schiller, MD 08/24/2012   Chief Complaint  Patient presents with  . Follow-up    Subjective: Mr. [name] was in to day for a follow up and discuss continuing to take his hydroxyurea after a brief un known etiology of a rash which he felt was related to this medication. During visit assessment observed his eyes where moderately icteric sclarea than previously seen. Also, after further discussion pt admits to not feeling well and having increase joint pains especially knees.  Review of Systems  Constitutional: Negative.   Eyes:       Moderate icteric sclarea   Respiratory: Negative.   Cardiovascular: Negative.   Gastrointestinal: Negative.   Genitourinary: Negative.   Musculoskeletal:       Arthralgic   Skin: Positive for rash.       Arms , neck   Neurological: Negative.   Endo/Heme/Allergies: Negative.   Psychiatric/Behavioral:       Anxiety    No Known Allergies   Outpatient Encounter Prescriptions as of 08/24/2012  Medication Sig Dispense Refill  . allopurinol (ZYLOPRIM) 300 MG tablet       . ALPRAZolam (XANAX) 0.25 MG tablet Take 1 tablet (0.25 mg total) by mouth 3 (three) times daily as needed for anxiety.  90 tablet  0  . deferasirox (EXJADE) 500 MG disintegrating tablet Take 3 tablets (1,500 mg total) by mouth daily before breakfast.  90 tablet  11  . folic acid (FOLVITE) 1 MG tablet Take 1 tablet (1 mg total) by mouth daily.  30 tablet  11  . HYDROmorphone (DILAUDID) 4 MG tablet Take 1 tablet (4 mg total) by mouth every 4 (four) hours as needed for pain.  90 tablet  0  . hydroxyurea (HYDREA) 500 MG capsule Take 1,000 mg by mouth daily. May take with food to minimize GI side effects.      . methadone (DOLOPHINE) 10 MG tablet Take 1 tablet by mouth daily.      . metoprolol (LOPRESSOR) 50 MG tablet Take 1 tablet (50 mg total) by mouth 2 (two) times  daily.  60 tablet  0  . sodium chloride (OCEAN) 0.65 % nasal spray Place 1 spray into the nose as needed. For nasal congestion      . zolpidem (AMBIEN) 10 MG tablet Take 1 tablet (10 mg total) by mouth at bedtime as needed. For sleep.  30 tablet  0   No facility-administered encounter medications on file as of 08/24/2012.    Physical Exam   Filed Vitals:   08/24/12 1608  BP: 107/52  Pulse: 65  Temp: 98.3 F (36.8 C)    General: Alert, awake, oriented x3, in mild distress.  HEENT: [place]/AT PEERL, EOMI, moderate icteric  sclera  Neck: Trachea midline,  no masses, no thyromegal,y no JVD, no carotid bruit OROPHARYNX:  Moist, No exudate/ erythema/lesions.  Heart: Regular rate and rhythm, without murmurs, rubs, gallops.  Lungs: Clear to auscultation, no wheezing or rhonchi noted. No  vocal fremitus resonant to percussion  Abdomen: Soft, nontender, nondistended, positive bowel sounds. No BM x' 2 days   Neuro: No focal neurological deficits noted cranial nerves II through XII grossly intact.  Musculoskeletal: No warm swelling or erythema around joints, no spinal tenderness noted. Psychiatric: Patient alert and oriented  Lymph node survey: No cervical axillary     Assessment/Plan: 1. Vaso occlusive crisis :  order labs and  Will review to [facility] tx plan for hydroxyurea. Cont folic acid.  Joint pain esp bilateral knees 7/10. Plan is to return in AM to [facility] for tx. Pt wish not to be admitted at this time.  2. Acute on chronic pain: continue methadone and continue to repeat EKG monthly visit ( 2-D echocardiogram done on 02/17/2012 shows no evidence of tricuspid regurgitation. 12-lead EKG shows an improved QTC of 483 ms) 3. Anxiety cont prn xanax  4. Rash: unknown etiology self limited and resolving  5. Constipation; secondary to narcotic use OTC senna S    50 % of visit in discussion of medication and treatment  No medication prescribed at this visit    [name], [facility]

## 2012-08-25 ENCOUNTER — Encounter (HOSPITAL_COMMUNITY): Payer: Self-pay | Admitting: *Deleted

## 2012-08-25 DIAGNOSIS — D5700 Hb-SS disease with crisis, unspecified: Principal | ICD-10-CM

## 2012-08-25 LAB — COMPREHENSIVE METABOLIC PANEL
ALT: 51 U/L (ref 0–53)
AST: 101 U/L — ABNORMAL HIGH (ref 0–37)
Alkaline Phosphatase: 146 U/L — ABNORMAL HIGH (ref 39–117)
CO2: 22 mEq/L (ref 19–32)
Chloride: 106 mEq/L (ref 96–112)
Creatinine, Ser: 1.02 mg/dL (ref 0.50–1.35)
GFR calc non Af Amer: 90 mL/min — ABNORMAL LOW (ref >90)
Potassium: 4.7 mEq/L (ref 3.5–5.1)
Total Bilirubin: 5.8 mg/dL — ABNORMAL HIGH (ref 0.3–1.2)

## 2012-08-25 LAB — CBC WITH DIFFERENTIAL/PLATELET
Basophils Absolute: 0.2 10*3/uL — ABNORMAL HIGH (ref 0.0–0.1)
Eosinophils Relative: 5 % (ref 0–5)
HCT: 17.7 % — ABNORMAL LOW (ref 39.0–52.0)
Lymphocytes Relative: 44 % (ref 12–46)
MCV: 109.3 fL — ABNORMAL HIGH (ref 78.0–100.0)
Monocytes Relative: 10 % (ref 3–12)
Neutro Abs: 3.7 10*3/uL (ref 1.7–7.7)
Platelets: 214 10*3/uL (ref 150–400)
RBC: 1.62 MIL/uL — ABNORMAL LOW (ref 4.22–5.81)
RDW: 25.8 % — ABNORMAL HIGH (ref 11.5–15.5)
WBC: 9.6 10*3/uL (ref 4.0–10.5)

## 2012-08-25 LAB — PREPARE RBC (CROSSMATCH)

## 2012-08-25 MED ORDER — DIPHENHYDRAMINE HCL 25 MG PO CAPS
25.0000 mg | ORAL_CAPSULE | Freq: Once | ORAL | Status: AC
  Administered 2012-08-25: 25 mg via ORAL

## 2012-08-25 MED ORDER — ACETAMINOPHEN 325 MG PO TABS
650.0000 mg | ORAL_TABLET | Freq: Once | ORAL | Status: AC
  Administered 2012-08-25: 650 mg via ORAL

## 2012-08-25 NOTE — Progress Notes (Signed)
Admitted to room 1314 from [facility] center. Pain 2-3/10. VSS. Oriented to room and unit. Verbalized understanding.

## 2012-08-25 NOTE — Plan of Care (Signed)
Problem: Phase I Progression Outcomes Goal: Contact [facility] [phone]) Outcome: Not Applicable Date Met:  08/25/12 Admitted from center

## 2012-08-25 NOTE — Progress Notes (Signed)
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 Patient denied active pain at this point. His pain level is at 5/10. He had elevated potassium yesterday which has improved after receiving Kayexalate. No shortness of breath no cough. No nausea vomiting or diarrhea. He has not been having frequent attacks. He is able to eat, drink and is fully functional at this point.  Objective: Vital signs in last 24 hours: Temp:  [97.9 F (36.6 C)-99 F (37.2 C)] 98 F (36.7 C) (07/23 0945) Pulse Rate:  [65-85] 74 (07/23 0945) Resp:  [16-18] 18 (07/23 0945) BP: (107-125)/(44-63) 113/60 mmHg (07/23 0945) SpO2:  [94 %-99 %] 98 % (07/23 0607) Weight:  [78.926 kg (174 lb)-80.74 kg (178 lb)] 78.926 kg (174 lb) (07/22 2300) Weight change:  Last BM Date: 08/24/12 (small)  Intake/Output from previous day: 07/22 0701 - 07/23 0700 In: 2240 [P.O.:240; I.V.:1000; IV Piggyback:750] Out: -  Intake/Output this shift: Total I/O In: 627.5 [P.O.:240; I.V.:250; Blood:137.5] Out: -   General appearance: alert, cooperative and no distress Eyes: conjunctivae/corneas clear. PERRL, EOM's intact. Fundi benign. Throat: lips, mucosa, and tongue normal; teeth and gums normal Neck: no adenopathy, no carotid bruit, no JVD, supple, symmetrical, trachea midline and thyroid not enlarged, symmetric, no tenderness/mass/nodules Back: symmetric, no curvature. ROM normal. No CVA tenderness. Resp: clear to auscultation bilaterally Chest wall: no tenderness Cardio: regular rate and rhythm, S1, S2 normal, no murmur, click, rub or gallop GI: soft, non-tender; bowel sounds normal; no masses,  no organomegaly Extremities: extremities normal, atraumatic, no cyanosis or edema Pulses: 2+ and symmetric Skin: Skin color, texture, turgor normal. No rashes or lesions Neurologic: Grossly normal  Lab Results:  Recent Labs  08/24/12 1859 08/25/12 0700  WBC  11.7* 9.6  HGB 7.3* 6.3*  HCT 20.0* 17.7*  PLT 276 214   BMET  Recent Labs  08/24/12 1712 08/25/12 0700  NA 135 137  K 5.5* 4.7  CL 104 106  CO2 23 22  GLUCOSE 84 98  BUN 24* 20  CREATININE 1.10 1.02  CALCIUM 9.0 8.4    Studies/Results: No results found.  Medications: I have reviewed the patient's current medications.  Assessment/Plan: A 42 yo admitted with sickle cell painful crisis hypokalemia and sickle cell hemolytic crisis.  #1 sickle cell hemolytic anemia: Patient is severely jaundiced. His hemoglobin has dropped from 7.3-6.3 g today. We will transfuse 1 unit of packed red blood cell and follow his posttransfusion hemoglobin.   #2 sickle cell painful crisis: Patient having adequate pain control. Continue current treatment.  #3 hyperkalemia: Patient receive Kayexalate yesterday. His potassium is now corrected.  #4 leukocytosis: This has resolved overnight.  #5 disposition: Patient will be discharged home in the morning if his hemoglobin is stable.  LOS: 1 day   Sharah Finnell,LAWAL 08/25/2012, 10:48 AM

## 2012-08-25 NOTE — Progress Notes (Addendum)
CRITICAL VALUE ALERT  Critical value received:  hgb 6.3  Date of notification:  7/22  Time of notification:  0715  Critical value read back:yes  Nurse who received alert:  Greig Right   MD notified (1st page):  Dr Mikeal Hawthorne  Time of first page:  [phone]  MD notified (2nd page):  Time of second page:  Responding MD:  Dr Mikeal Hawthorne  Time MD responded:  [phone]

## 2012-08-25 NOTE — Progress Notes (Signed)
Patient refused toradol. States last time took affected his kidneys.

## 2012-08-25 NOTE — Care Management Note (Signed)
CARE MANAGEMENT NOTE 08/25/2012  Patient:  [name], [name]   Account Number:  [id number]  Date Initiated:  08/25/2012  Documentation initiated by:  Baani Bober  Subjective/Objective Assessment:   42 yo male admitted with vaso occlusive crisis.PCP: MATTHEWS,MICHELLE A., MD     Action/Plan:   Home when stable   Anticipated DC Date:     Anticipated DC Plan:  HOME/SELF CARE      DC Planning Services  CM consult      Choice offered to / List presented to:  NA   DME arranged  NA      DME agency  NA     HH arranged  NA      HH agency  NA   Status of service:  In process, will continue to follow Medicare Important Message given?   (If response is "NO", the following Medicare IM given date fields will be blank) Date Medicare IM given:   Date Additional Medicare IM given:    Discharge Disposition:    Per UR Regulation:  Reviewed for med. necessity/level of care/duration of stay  If discussed at Long Length of Stay Meetings, dates discussed:    Comments:  08/25/12 1510 Fusako Tanabe,RN,BSN [phone] Chart reviewed for utilization of services. PCP: MATTHEWS,MICHELLE A., MD. No bariers identified at this time.

## 2012-08-26 LAB — COMPREHENSIVE METABOLIC PANEL
ALT: 42 U/L (ref 0–53)
AST: 85 U/L — ABNORMAL HIGH (ref 0–37)
Alkaline Phosphatase: 152 U/L — ABNORMAL HIGH (ref 39–117)
Calcium: 8.6 mg/dL (ref 8.4–10.5)
Potassium: 5.7 mEq/L — ABNORMAL HIGH (ref 3.5–5.1)
Sodium: 136 mEq/L (ref 135–145)
Total Protein: 6.9 g/dL (ref 6.0–8.3)

## 2012-08-26 LAB — CBC
HCT: 18.7 % — ABNORMAL LOW (ref 39.0–52.0)
Hemoglobin: 6.8 g/dL — CL (ref 13.0–17.0)
MCH: 38.6 pg — ABNORMAL HIGH (ref 26.0–34.0)
MCHC: 36.4 g/dL — ABNORMAL HIGH (ref 30.0–36.0)
MCV: 106.3 fL — ABNORMAL HIGH (ref 78.0–100.0)

## 2012-08-26 MED ORDER — SODIUM POLYSTYRENE SULFONATE 15 GM/60ML PO SUSP
30.0000 g | Freq: Once | ORAL | Status: AC
  Administered 2012-08-26: 30 g via ORAL
  Filled 2012-08-26: qty 120

## 2012-08-26 MED ORDER — SODIUM POLYSTYRENE SULFONATE 15 GM/60ML PO SUSP
15.0000 g | Freq: Once | ORAL | Status: AC
  Administered 2012-08-26: 15 g via ORAL
  Filled 2012-08-26: qty 60

## 2012-08-26 NOTE — Progress Notes (Signed)
K+ 5.7 this am paged Donnamarie Poag with results.

## 2012-08-27 LAB — COMPREHENSIVE METABOLIC PANEL
ALT: 41 U/L (ref 0–53)
BUN: 14 mg/dL (ref 6–23)
CO2: 22 mEq/L (ref 19–32)
Calcium: 8.6 mg/dL (ref 8.4–10.5)
Creatinine, Ser: 0.95 mg/dL (ref 0.50–1.35)
GFR calc Af Amer: >90 mL/min (ref >90)
GFR calc non Af Amer: >90 mL/min (ref >90)
Glucose, Bld: 99 mg/dL (ref 70–99)

## 2012-08-27 LAB — CBC WITH DIFFERENTIAL/PLATELET
Basophils Relative: 1 % (ref 0–1)
Eosinophils Absolute: 0.4 10*3/uL (ref 0.0–0.7)
Hemoglobin: 6.6 g/dL — CL (ref 13.0–17.0)
Lymphocytes Relative: 38 % (ref 12–46)
MCHC: 36.1 g/dL — ABNORMAL HIGH (ref 30.0–36.0)
Neutrophils Relative %: 47 % (ref 43–77)
RBC: 1.79 MIL/uL — ABNORMAL LOW (ref 4.22–5.81)
WBC: 12.5 10*3/uL — ABNORMAL HIGH (ref 4.0–10.5)

## 2012-08-27 LAB — CBC
HCT: 18.4 % — ABNORMAL LOW (ref 39.0–52.0)
Platelets: 188 10*3/uL (ref 150–400)
RDW: 23.6 % — ABNORMAL HIGH (ref 11.5–15.5)
WBC: 12.8 10*3/uL — ABNORMAL HIGH (ref 4.0–10.5)

## 2012-08-27 LAB — PREPARE RBC (CROSSMATCH)

## 2012-08-27 NOTE — Progress Notes (Signed)
Subjective: Patient has done better but potassium is still elevated. He still having low-grade pain but no fever. He continues to have low hemoglobin but stable. His baseline hemoglobin is around 7.5. No shortness of breath no cough. No significant weakness.  Objective: Vital signs in last 24 hours: Temp:  [98.8 F (37.1 C)-99.6 F (37.6 C)] 99.5 F (37.5 C) (07/24 1022) Pulse Rate:  [74-92] 89 (07/24 1022) Resp:  [15-16] 16 (07/24 1022) BP: (108-126)/(50-65) 114/53 mmHg (07/24 1022) SpO2:  [91 %-95 %] 95 % (07/24 0950) Weight:  [80.876 kg (178 lb 4.8 oz)] 80.876 kg (178 lb 4.8 oz) (07/24 0533) Weight change:  Last BM Date: 08/26/12  Intake/Output from previous day: 07/23 0701 - 07/24 0700 In: 4729.3 [P.O.:1540; I.V.:3189.3] Out: 925 [Urine:925] Intake/Output this shift: Total I/O In: 120 [P.O.:120] Out: -   General appearance: alert, cooperative and no distress Eyes: conjunctivae/corneas clear. PERRL, EOM's intact. Fundi benign. Throat: lips, mucosa, and tongue normal; teeth and gums normal Neck: no adenopathy, no carotid bruit, no JVD, supple, symmetrical, trachea midline and thyroid not enlarged, symmetric, no tenderness/mass/nodules Back: symmetric, no curvature. ROM normal. No CVA tenderness. Resp: clear to auscultation bilaterally Chest wall: no tenderness Cardio: regular rate and rhythm, S1, S2 normal, no murmur, click, rub or gallop GI: soft, non-tender; bowel sounds normal; no masses,  no organomegaly Extremities: extremities normal, atraumatic, no cyanosis or edema Pulses: 2+ and symmetric Skin: Skin color, texture, turgor normal. No rashes or lesions Neurologic: Grossly normal  Lab Results:  Recent Labs  08/26/12 0404  WBC 12.6*  HGB 6.8*  HCT 18.7*  PLT 207   BMET  Recent Labs  08/26/12 0404 08/26/12 1821  NA 136  --   K 5.7* 4.7  CL 108  --   CO2 22  --   GLUCOSE 107*  --   BUN 16  --   CREATININE 1.01  --   CALCIUM 8.6  --      Studies/Results: No results found.  Medications: I have reviewed the patient's current medications.  Assessment/Plan: A 42 yo admitted with sickle cell painful crisis hypokalemia and sickle cell hemolytic crisis.  #1 sickle cell hemolytic anemia: His H&H has remained stable today. It is still below his baseline which is around 7.5. We will therefore watch it closely. Hopefully it will rise on his own. No evidence of active hemolysis again.  #2 sickle cell painful crisis: Patient having adequate pain control. We will de-escalate treatment doses.  #3 hyperkalemia: Potassium is high once again. Most likely coming from hemolysis. I will repeat the dose of Kayexalate today.  #4 leukocytosis: Most likely from acute sickle cell crisis.  #5 disposition: Home when potassium is corrected and hemoglobin stabilizes  LOS: 2 days   Gabriellah Rabel,LAWAL 08/26/2012, 12:58 AM

## 2012-08-27 NOTE — Progress Notes (Signed)
CRITICAL VALUE ALERT  Critical value received: Hgb 6.8   Date of notification:  08/27/13  Time of notification:  1707  Critical value read back:yes  Nurse who received alert:  Eugene Garnet  MD notified (1st page):  Dr. Mikeal Hawthorne  Time of first page:  1709  MD notified (2nd page):  Time of second page:  Responding MD:  Dr. Mikeal Hawthorne  Time MD responded:  [phone]

## 2012-08-27 NOTE — Progress Notes (Signed)
Patient continues to refuse toradol

## 2012-08-27 NOTE — Progress Notes (Signed)
Kayexalate 15 g given per md order.

## 2012-08-28 DIAGNOSIS — E875 Hyperkalemia: Secondary | ICD-10-CM

## 2012-08-28 DIAGNOSIS — D571 Sickle-cell disease without crisis: Secondary | ICD-10-CM

## 2012-08-28 LAB — COMPREHENSIVE METABOLIC PANEL
ALT: 38 U/L (ref 0–53)
AST: 84 U/L — ABNORMAL HIGH (ref 0–37)
Albumin: 3.2 g/dL — ABNORMAL LOW (ref 3.5–5.2)
Alkaline Phosphatase: 129 U/L — ABNORMAL HIGH (ref 39–117)
Chloride: 107 mEq/L (ref 96–112)
Potassium: 4.6 mEq/L (ref 3.5–5.1)
Total Bilirubin: 7.2 mg/dL — ABNORMAL HIGH (ref 0.3–1.2)

## 2012-08-28 LAB — CBC
MCH: 36.2 pg — ABNORMAL HIGH (ref 26.0–34.0)
MCV: 100 fL (ref 78.0–100.0)
Platelets: 217 10*3/uL (ref 150–400)
RDW: 23.7 % — ABNORMAL HIGH (ref 11.5–15.5)

## 2012-08-28 LAB — TYPE AND SCREEN
ABO/RH(D): A POS
Unit division: 0

## 2012-08-28 MED ORDER — ACETAMINOPHEN 325 MG PO TABS
650.0000 mg | ORAL_TABLET | Freq: Once | ORAL | Status: AC
  Administered 2012-08-28: 650 mg via ORAL
  Filled 2012-08-28: qty 2

## 2012-08-28 NOTE — Progress Notes (Signed)
Subjective: 42 year old gentleman admitted with sickle cell painful crisis and hemolytic crisis. Patient's baseline hemoglobin is 7.5. His pain level has been better today but his hemoglobin still remains low. No shortness of breath or cough. White count has been high but stable. He's had high potassium from hemolysis but this seemed to be corrected with Kayexalate.  Objective: Vital signs in last 24 hours: Temp:  [98.7 F (37.1 C)-100.1 F (37.8 C)] 98.7 F (37.1 C) (07/26 1032) Pulse Rate:  [77-96] 96 (07/26 1032) Resp:  [16-20] 18 (07/26 1032) BP: (94-134)/(46-60) 122/58 mmHg (07/26 1032) SpO2:  [92 %-98 %] 98 % (07/26 1032) Weight:  [80.967 kg (178 lb 8 oz)] 80.967 kg (178 lb 8 oz) (07/26 0535) Weight change: 0.091 kg (3.2 oz) Last BM Date: 08/26/12  Intake/Output from previous day: 07/25 0701 - 07/26 0700 In: 2049.2 [P.O.:520; I.V.:1216.7; Blood:312.5] Out: -  Intake/Output this shift: Total I/O In: 240 [P.O.:240] Out: 600 [Urine:600]  General appearance: alert, cooperative and no distress Eyes: conjunctivae/corneas clear. PERRL, EOM's intact. Fundi benign. Neck: no adenopathy, no carotid bruit, no JVD, supple, symmetrical, trachea midline and thyroid not enlarged, symmetric, no tenderness/mass/nodules Back: symmetric, no curvature. ROM normal. No CVA tenderness. Resp: clear to auscultation bilaterally Cardio: regular rate and rhythm, S1, S2 normal, no murmur, click, rub or gallop GI: soft, non-tender; bowel sounds normal; no masses,  no organomegaly Extremities: extremities normal, atraumatic, no cyanosis or edema Skin: Skin color, texture, turgor normal. No rashes or lesions Neurologic: Grossly normal  Lab Results:  Recent Labs  08/27/12 1629 08/28/12 0402  WBC 12.8* 13.5*  HGB 6.8* 6.8*  HCT 18.4* 18.8*  PLT 188 217   BMET  Recent Labs  08/27/12 0533 08/28/12 0402  NA 138 136  K 5.1 4.6  CL 109 107  CO2 22 22  GLUCOSE 99 114*  BUN 14 19  CREATININE  0.95 1.01  CALCIUM 8.6 8.3*    Studies/Results: No results found.  Medications: I have reviewed the patient's current medications.  Assessment/Plan: A 42 year old gentleman with hyperkalemia, sickle cell disease crisis, hemolytic anemia.  #1 sickle cell hemolytic anemia: We will transfuse 1 unit of packed red blood cell again. The goal is to get patient to his baseline which is reducible 0.5 on 8.0. Once he is up there will hopefully discharge him.  #2 hyperkalemia: Corrected now  #3 sickle cell painful crisis: No change in treatment. His pain is fully under control.  #4 leukocytosis: Secondary to VOC. Continue monitoring.  LOS: 4 days   Somya Jauregui,LAWAL 08/28/2012, 11:40 AM

## 2012-08-28 NOTE — Progress Notes (Signed)
Subjective: Patient doing better but having borderline hemoglobin of 6.6. It appears he is  still hemolyzing. He looks very much jaundiced. No fever no cough no shortness of breath. His pain level is at his baseline of 6/10. He denied any significant complaint at this point.  Objective: Vital signs in last 24 hours: Temp:  [98.7 F (37.1 C)-100.1 F (37.8 C)] 98.7 F (37.1 C) (07/25 1032) Pulse Rate:  [77-96] 96 (07/25 1032) Resp:  [16-20] 18 (07/26 1032) BP: (94-134)/(46-60) 122/58 mmHg (07/25 1032) SpO2:  [92 %-98 %] 98 % (07/25 1032) Weight:  [80.967 kg (178 lb 8 oz)] 80.967 kg (178 lb 8 oz) (07/25 0535) Weight change: 0.091 kg (3.2 oz) Last BM Date: 08/26/12  Intake/Output from previous day: 07/24 0701 - 07/25 0700 In: 2049.2 [P.O.:520; I.V.:1216.7; Blood:312.5] Out: -  Intake/Output this shift: Total I/O In: 240 [P.O.:240] Out: 600 [Urine:600]  General appearance: alert, appears stated age and no distress Eyes: positive findings: sclera Jaundice Neck: no adenopathy, no carotid bruit, no JVD, supple, symmetrical, trachea midline and thyroid not enlarged, symmetric, no tenderness/mass/nodules Resp: clear to auscultation bilaterally Chest wall: no tenderness Cardio: regular rate and rhythm, S1, S2 normal, no murmur, click, rub or gallop GI: soft, non-tender; bowel sounds normal; no masses,  no organomegaly Extremities: extremities normal, atraumatic, no cyanosis or edema Pulses: 2+ and symmetric Skin: Skin color, texture, turgor normal. No rashes or lesions Neurologic: Grossly normal  Lab Results:  Recent Labs  08/27/12 0533  WBC 12.5*  HGB 6.6*  HCT 18.3*  PLT 216   BMET  Recent Labs  08/27/12 0533  NA 138  K 5.1  CL 109  CO2 22  GLUCOSE 99  BUN 14  CREATININE 0.95  CALCIUM 8.6    Studies/Results: No results found.  Medications: I have reviewed the patient's current medications.  Assessment/Plan: A 42 yo admitted with sickle cell painful crisis  and hemolytic crisis.  #1 sickle cell hemolytic crisis: Patient seems to be actively hemolyzing still. We will transfuse 1 unit of packed red blood cell if he is stil lower than his baseline of 7.5 today. The goal is to get his hemoglobin  above 7.  #2 sickle cell painful crisis: Patient seems to be doing much better today. Continue pain control.  #3 hypokalemia: Improved and resolved.  #4 disposition: Will discharge him home once his hemoglobin is stable.   LOS: 3 days   GARBA,LAWAL 08/27/2012, 11:24 AM

## 2012-08-28 NOTE — Progress Notes (Signed)
Pt has been refusing Toradol.

## 2012-08-29 LAB — COMPREHENSIVE METABOLIC PANEL
ALT: 40 U/L (ref 0–53)
AST: 86 U/L — ABNORMAL HIGH (ref 0–37)
CO2: 22 mEq/L (ref 19–32)
Calcium: 8.5 mg/dL (ref 8.4–10.5)
Chloride: 108 mEq/L (ref 96–112)
GFR calc Af Amer: >90 mL/min (ref >90)
GFR calc non Af Amer: >90 mL/min (ref >90)
Glucose, Bld: 105 mg/dL — ABNORMAL HIGH (ref 70–99)
Sodium: 136 mEq/L (ref 135–145)
Total Bilirubin: 7.9 mg/dL — ABNORMAL HIGH (ref 0.3–1.2)

## 2012-08-29 LAB — CBC
Hemoglobin: 7.5 g/dL — ABNORMAL LOW (ref 13.0–17.0)
MCH: 35.4 pg — ABNORMAL HIGH (ref 26.0–34.0)
MCHC: 36.1 g/dL — ABNORMAL HIGH (ref 30.0–36.0)
Platelets: 218 10*3/uL (ref 150–400)
RDW: 23.8 % — ABNORMAL HIGH (ref 11.5–15.5)

## 2012-08-29 NOTE — Discharge Summary (Addendum)
Physician Discharge Summary  Patient ID: [name] MRN: [id number] DOB/AGE: 41-26-1972 41 y.o.  Admit date: 08/24/2012 Discharge date: 08/29/2012  Admission Diagnoses:  Discharge Diagnoses:  Active Problems:   * No active hospital problems. *   Discharged Condition: good  Hospital Course: 42 year old gentleman admitted into the hospital with sickle cell painful crisis and hemolytic anemia. Patient also had hyperkalemia with a potassium of 5.7. This was believed to be secondary to hemolysis. He received 2 units of packed red blood cells total as well as Kayexalate to correct his potassium.he is now stable with a hemoglobin of 7.5. His potassium also is stable.patient is now back to his baseline. We will therefore proceed with discharge home to followup in the clinic within the next 2 weeks. He has all his home medications and will not require any prescriptions today.  Consults: None  Significant Diagnostic Studies: labs: Serial CBCs and CMP's were checked.   Treatments: IV hydration, analgesia: Dilaudid and blood transfusions   Discharge Exam: Blood pressure 116/59, pulse 80, temperature 99.1 F (37.3 C), temperature source Oral, resp. rate 16, height 5\' 11"  (1.803 m), weight 80.967 kg (178 lb 8 oz), SpO2 94.00%. General appearance: alert, cooperative and no distress Eyes: conjunctivae/corneas clear. PERRL, EOM's intact. Fundi benign. Neck: no adenopathy, no carotid bruit, no JVD, supple, symmetrical, trachea midline and thyroid not enlarged, symmetric, no tenderness/mass/nodules Back: symmetric, no curvature. ROM normal. No CVA tenderness. Resp: clear to auscultation bilaterally Cardio: regular rate and rhythm, S1, S2 normal, no murmur, click, rub or gallop GI: soft, non-tender; bowel sounds normal; no masses,  no organomegaly Extremities: extremities normal, atraumatic, no cyanosis or edema Skin: Skin color, texture, turgor normal. No rashes or lesions Neurologic: Grossly  normal  Disposition: 01-Home or Self Care     Medication List         allopurinol 300 MG tablet  Commonly known as:  ZYLOPRIM  Take 300 mg by mouth daily.     ALPRAZolam 0.25 MG tablet  Commonly known as:  XANAX  Take 1 tablet (0.25 mg total) by mouth 3 (three) times daily as needed for anxiety.     folic acid 1 MG tablet  Commonly known as:  FOLVITE  Take 1 tablet (1 mg total) by mouth daily.     HYDROmorphone 4 MG tablet  Commonly known as:  DILAUDID  Take 1 tablet (4 mg total) by mouth every 4 (four) hours as needed for pain.     hydroxyurea 500 MG capsule  Commonly known as:  HYDREA  Take 1,000 mg by mouth daily. May take with food to minimize GI side effects.     methadone 10 MG tablet  Commonly known as:  DOLOPHINE  Take 1 tablet by mouth daily.     metoprolol 50 MG tablet  Commonly known as:  LOPRESSOR  Take 50 mg by mouth 2 (two) times daily.     zolpidem 10 MG tablet  Commonly known as:  AMBIEN  Take 1 tablet (10 mg total) by mouth at bedtime as needed. For sleep.         SignedLonia Blood 08/29/2012, 12:20 PM  Time spent in discharge planning 33 minutes.

## 2012-08-29 NOTE — Progress Notes (Signed)
Pt. Was discharged home. He was given his discharge instructions and he was transported home by a friend.

## 2012-08-30 LAB — TYPE AND SCREEN
ABO/RH(D): A POS
Antibody Screen: NEGATIVE
Unit division: 0

## 2012-08-30 NOTE — Progress Notes (Signed)
Discharge summary sent to payer through [facility]  

## 2012-09-14 NOTE — H&P (Signed)
Agreed with above notes. Patient seen and examined with NP Gwinda Passe.

## 2012-09-21 ENCOUNTER — Telehealth: Payer: Self-pay | Admitting: Internal Medicine

## 2012-09-21 DIAGNOSIS — F411 Generalized anxiety disorder: Secondary | ICD-10-CM

## 2012-09-21 DIAGNOSIS — Z604 Social exclusion and rejection: Secondary | ICD-10-CM

## 2012-09-21 DIAGNOSIS — D5700 Hb-SS disease with crisis, unspecified: Secondary | ICD-10-CM

## 2012-09-23 MED ORDER — HYDROMORPHONE HCL 4 MG PO TABS
4.0000 mg | ORAL_TABLET | ORAL | Status: DC | PRN
Start: 2012-09-23 — End: 2012-11-15

## 2012-09-23 MED ORDER — ALPRAZOLAM 0.25 MG PO TABS: 0.2500 mg | ORAL_TABLET | Freq: Three times a day (TID) | ORAL | Status: DC | PRN

## 2012-09-23 MED ORDER — IBUPROFEN 800 MG PO TABS: 800.0000 mg | ORAL_TABLET | Freq: Three times a day (TID) | ORAL | Status: DC | PRN

## 2012-09-23 NOTE — Telephone Encounter (Signed)
Pt called and is completely out of his short acting medication and may not be able to pick up prescription until Monday. A prescription was sent in for ibuprofen 800 mg tid prn to take with food. He will use ibuprofen schedule tid until Monday if problem occurs and he will not be able to get prescription.

## 2012-10-11 ENCOUNTER — Other Ambulatory Visit: Payer: BC Managed Care – PPO | Admitting: *Deleted

## 2012-10-11 ENCOUNTER — Ambulatory Visit: Payer: BC Managed Care – PPO | Admitting: Internal Medicine

## 2012-10-11 DIAGNOSIS — D571 Sickle-cell disease without crisis: Secondary | ICD-10-CM

## 2012-10-11 DIAGNOSIS — Z9119 Patient's noncompliance with other medical treatment and regimen: Secondary | ICD-10-CM

## 2012-10-11 DIAGNOSIS — Z79899 Other long term (current) drug therapy: Secondary | ICD-10-CM

## 2012-10-11 LAB — COMPREHENSIVE METABOLIC PANEL
ALT: 47 U/L (ref 0–53)
AST: 73 U/L — ABNORMAL HIGH (ref 0–37)
Albumin: 4 g/dL (ref 3.5–5.2)
Alkaline Phosphatase: 140 U/L — ABNORMAL HIGH (ref 39–117)
Glucose, Bld: 99 mg/dL (ref 70–99)
Potassium: 4.2 mEq/L (ref 3.5–5.3)
Sodium: 138 mEq/L (ref 135–145)
Total Bilirubin: 5.3 mg/dL — ABNORMAL HIGH (ref 0.3–1.2)
Total Protein: 7.5 g/dL (ref 6.0–8.3)

## 2012-10-12 ENCOUNTER — Other Ambulatory Visit: Payer: Self-pay | Admitting: Primary Care

## 2012-10-12 LAB — CBC WITH DIFFERENTIAL/PLATELET
Basophils Relative: 1 % (ref 0–1)
Eosinophils Absolute: 0.4 10*3/uL (ref 0.0–0.7)
Eosinophils Relative: 3 % (ref 0–5)
Lymphs Abs: 5.8 10*3/uL — ABNORMAL HIGH (ref 0.7–4.0)
MCH: 33.6 pg (ref 26.0–34.0)
MCHC: 34.9 g/dL (ref 30.0–36.0)
MCV: 96.5 fL (ref 78.0–100.0)
Neutrophils Relative %: 46 % (ref 43–77)
Platelets: 349 10*3/uL (ref 150–400)
RBC: 2.26 MIL/uL — ABNORMAL LOW (ref 4.22–5.81)

## 2012-10-13 NOTE — Telephone Encounter (Signed)
Refilled Ambien 10mg  [facility] prn # 30 with 1 refill

## 2012-10-18 ENCOUNTER — Ambulatory Visit (INDEPENDENT_AMBULATORY_CARE_PROVIDER_SITE_OTHER): Payer: BC Managed Care – PPO | Admitting: Internal Medicine

## 2012-10-18 ENCOUNTER — Encounter: Payer: Self-pay | Admitting: Internal Medicine

## 2012-10-18 ENCOUNTER — Other Ambulatory Visit: Payer: Self-pay | Admitting: Primary Care

## 2012-10-18 VITALS — BP 133/69 | HR 82 | Temp 98.5°F | Resp 14 | Ht 70.0 in | Wt 183.0 lb

## 2012-10-18 DIAGNOSIS — D571 Sickle-cell disease without crisis: Secondary | ICD-10-CM

## 2012-10-18 DIAGNOSIS — M79643 Pain in unspecified hand: Secondary | ICD-10-CM

## 2012-10-18 DIAGNOSIS — M25549 Pain in joints of unspecified hand: Secondary | ICD-10-CM

## 2012-10-18 DIAGNOSIS — R000 Tachycardia, unspecified: Secondary | ICD-10-CM

## 2012-10-18 MED ORDER — METOPROLOL TARTRATE 50 MG PO TABS: 50.0000 mg | ORAL_TABLET | Freq: Two times a day (BID) | ORAL | Status: DC

## 2012-10-18 NOTE — Progress Notes (Signed)
Subjective:    Patient ID: [name], male    DOB: Mar 26, 1970, 42 y.o.   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 He states that he has been doing well in terms of pain. He has been using Methadone only occasionally. He has not been taking his metoprolol and states that he only takes it when he feels a fast heart rate. I have advised him of rebound tachycardia if used inappropriately.   Pt also c/o of pain in b/l hands upon awakening in the morning. HE normally take ibuprofen to alleviate the pain and states that the pain and stiffness improves as the day progresses.   Pt also reports that he has not been seeing the counselor as he is unable to schedule 2 x week visits.     Review of Systems  Constitutional: Negative.   HENT: Negative.   Eyes: Negative.   Respiratory: Negative.   Cardiovascular: Negative.   Gastrointestinal: Negative.   Endocrine: Negative.   Genitourinary: Negative.   Musculoskeletal: Positive for arthralgias. Negative for myalgias.  Skin: Negative.   Allergic/Immunologic: Negative.   Neurological: Negative.   Hematological: Negative.   Psychiatric/Behavioral: Positive for sleep disturbance.       Objective:   Physical Exam  Constitutional: He is oriented to person, place, and time. He appears well-developed and well-nourished.  HENT:  Head: Atraumatic.  Eyes: Conjunctivae and EOM are normal. Pupils are equal, round, and reactive to light. No scleral icterus.  Neck: Normal range of motion. Neck supple.  Cardiovascular: Normal rate and regular rhythm.  Exam reveals no gallop and no friction rub.   No murmur heard. Pulmonary/Chest: Effort normal and breath sounds normal. He has no wheezes. He has no rales. He exhibits no tenderness.  Abdominal: Soft. Bowel sounds are normal. He exhibits no mass.  Musculoskeletal: Normal range of motion.  Neurological: He is alert and oriented to person, place, and time.  Skin: Skin is warm and dry.   Psychiatric: He has a normal mood and affect. His behavior is normal. Judgment and thought content normal.          Assessment & Plan:  1. Hb SS: Pt states that he has been doing very well and is taking only 10 mg of methadone daily. He has increased the dose and taken 20 mg at a time when his pain increased. I counseled patient of the risk of this up to and including death and he understands and states that he will not do that again. He states that he has only been requiring his Dilaudid about 1-2 x day. I have advised patient to continue on Methadone 10 mg daily and Dilaudid PRN. We will work on getting off of Methadone altogether. I have discussed Talwin-NX as an option for this patient as he has very infrequent crisis. He will discuss with his sister and make a decision.  2. Joint Pain: Pt reports pain in joints of B/L hands upon awakening in the morning which improves as the day goes on. He also attributes the improvement to taking Ibuprofen. Will obtain RF and CCP to r/o Rheumatoid arthritis.  3. Depression: Pt had been seeing a counselor at [facility], but has not followed up as he is unable to schedule 2 x week visits. Adised patietn to schedule 1 x week.  4. Anxiety: Pt states that he has been doing well wit the use of Xanax PRN and has needed it an average of 1 x day  5.  Insomnia: Continiue Ambien

## 2012-10-18 NOTE — Progress Notes (Signed)
Patient: [name] DOB :Oct 30, 1970 MRN :[id number]  Date: 10/18/2012  Documentation Initiated by : Jefm Miles  Subjective/Objective Assessment: Mr. Craney is a 42 year old male with known SCD. He is currently seeing Dr. Ashley Royalty for a follow up appointment. Mr. Houp states he has not seen the counselor at [facility] due to scheduling concerns. Mr. Mendolia states the counselor wants to see him 2 times per week. This CM advised Mr. Wambold to schedule his counselor appointments the same day he schedules his appointment to see Dr. Ashley Royalty and advise he can only be seen once a week not twice a week. Mr. Cuffe verbalized understanding and agreed to follow up with the counselor.  Barriers to Care: Unable to schedule follow up appointment with counselor.   Prior Approval (PA) #: na PA start date:na PA end date:  na  Action/Plan: This CM will continue to monitor as it regards to scheduling with counselor.  Comments: na Time spent: 30 minutes  Karoline Caldwell, RN, BSN, [place]     [phone]

## 2012-10-19 ENCOUNTER — Encounter: Payer: Self-pay | Admitting: Internal Medicine

## 2012-10-19 DIAGNOSIS — R000 Tachycardia, unspecified: Secondary | ICD-10-CM | POA: Insufficient documentation

## 2012-10-19 DIAGNOSIS — M25549 Pain in joints of unspecified hand: Secondary | ICD-10-CM

## 2012-10-19 DIAGNOSIS — D571 Sickle-cell disease without crisis: Secondary | ICD-10-CM | POA: Insufficient documentation

## 2012-10-19 HISTORY — DX: Pain in joints of unspecified hand: M25.549

## 2012-10-25 ENCOUNTER — Other Ambulatory Visit: Payer: BC Managed Care – PPO

## 2012-11-15 ENCOUNTER — Telehealth: Payer: Self-pay | Admitting: Primary Care

## 2012-11-15 ENCOUNTER — Other Ambulatory Visit: Payer: Self-pay | Admitting: Primary Care

## 2012-11-15 DIAGNOSIS — D571 Sickle-cell disease without crisis: Secondary | ICD-10-CM

## 2012-11-15 MED ORDER — HYDROMORPHONE HCL 4 MG PO TABS: 4.0000 mg | ORAL_TABLET | ORAL | Status: DC | PRN

## 2012-11-15 NOTE — Progress Notes (Signed)
Hydromorphone 4mg  # 60 ready for pick up

## 2012-11-29 ENCOUNTER — Non-Acute Institutional Stay (HOSPITAL_COMMUNITY)
Admission: AD | Admit: 2012-11-29 | Discharge: 2012-11-29 | Disposition: A | Discharge status: Home / Self Care | Payer: BC Managed Care – PPO | Attending: Internal Medicine | Admitting: Internal Medicine

## 2012-11-29 ENCOUNTER — Encounter (HOSPITAL_COMMUNITY): Payer: Self-pay | Admitting: Hematology

## 2012-11-29 ENCOUNTER — Telehealth (HOSPITAL_COMMUNITY): Payer: Self-pay | Admitting: Hematology

## 2012-11-29 DIAGNOSIS — G8929 Other chronic pain: Secondary | ICD-10-CM | POA: Insufficient documentation

## 2012-11-29 DIAGNOSIS — M25559 Pain in unspecified hip: Secondary | ICD-10-CM | POA: Insufficient documentation

## 2012-11-29 DIAGNOSIS — Z9119 Patient's noncompliance with other medical treatment and regimen: Secondary | ICD-10-CM | POA: Insufficient documentation

## 2012-11-29 DIAGNOSIS — D5700 Hb-SS disease with crisis, unspecified: Secondary | ICD-10-CM | POA: Insufficient documentation

## 2012-11-29 DIAGNOSIS — F1120 Opioid dependence, uncomplicated: Secondary | ICD-10-CM | POA: Insufficient documentation

## 2012-11-29 DIAGNOSIS — M25519 Pain in unspecified shoulder: Secondary | ICD-10-CM | POA: Insufficient documentation

## 2012-11-29 DIAGNOSIS — E860 Dehydration: Secondary | ICD-10-CM | POA: Insufficient documentation

## 2012-11-29 DIAGNOSIS — Z91199 Patient's noncompliance with other medical treatment and regimen due to unspecified reason: Secondary | ICD-10-CM | POA: Insufficient documentation

## 2012-11-29 LAB — COMPREHENSIVE METABOLIC PANEL
ALT: 63 U/L — ABNORMAL HIGH (ref 0–53)
AST: 124 U/L — ABNORMAL HIGH (ref 0–37)
Albumin: 3.3 g/dL — ABNORMAL LOW (ref 3.5–5.2)
Alkaline Phosphatase: 144 U/L — ABNORMAL HIGH (ref 39–117)
CO2: 25 mEq/L (ref 19–32)
Chloride: 107 mEq/L (ref 96–112)
GFR calc non Af Amer: 87 mL/min — ABNORMAL LOW (ref >90)
Glucose, Bld: 85 mg/dL (ref 70–99)
Potassium: 4.4 mEq/L (ref 3.5–5.1)
Sodium: 139 mEq/L (ref 135–145)
Total Bilirubin: 7.4 mg/dL — ABNORMAL HIGH (ref 0.3–1.2)

## 2012-11-29 LAB — CBC WITH DIFFERENTIAL/PLATELET
Basophils Relative: 1 % (ref 0–1)
Eosinophils Absolute: 0.6 10*3/uL (ref 0.0–0.7)
Eosinophils Relative: 3 % (ref 0–5)
HCT: 20.5 % — ABNORMAL LOW (ref 39.0–52.0)
Lymphs Abs: 6.5 10*3/uL — ABNORMAL HIGH (ref 0.7–4.0)
MCH: 34.1 pg — ABNORMAL HIGH (ref 26.0–34.0)
MCV: 94.5 fL (ref 78.0–100.0)
Monocytes Absolute: 2 10*3/uL — ABNORMAL HIGH (ref 0.1–1.0)
Neutro Abs: 10.4 10*3/uL — ABNORMAL HIGH (ref 1.7–7.7)
Platelets: 285 10*3/uL (ref 150–400)
RBC: 2.17 MIL/uL — ABNORMAL LOW (ref 4.22–5.81)
RDW: 28.4 % — ABNORMAL HIGH (ref 11.5–15.5)

## 2012-11-29 LAB — RETICULOCYTES: Retic Count, Absolute: 787.7 10*3/uL — ABNORMAL HIGH (ref 19.0–186.0)

## 2012-11-29 MED ORDER — METOPROLOL TARTRATE 25 MG PO TABS
50.0000 mg | ORAL_TABLET | Freq: Two times a day (BID) | ORAL | Status: DC
  Administered 2012-11-29: 50 mg via ORAL
  Filled 2012-11-29: qty 2

## 2012-11-29 MED ORDER — ONDANSETRON HCL 4 MG/2ML IJ SOLN: 4.0000 mg | Freq: Four times a day (QID) | INTRAMUSCULAR | Status: DC | PRN

## 2012-11-29 MED ORDER — HYDROMORPHONE HCL PF 2 MG/ML IJ SOLN
2.0000 mg | Freq: Once | INTRAMUSCULAR | Status: AC
  Administered 2012-11-29: 2 mg via SUBCUTANEOUS
  Filled 2012-11-29: qty 1

## 2012-11-29 MED ORDER — DIPHENHYDRAMINE HCL 12.5 MG/5ML PO ELIX: 12.5000 mg | ORAL_SOLUTION | Freq: Four times a day (QID) | ORAL | Status: DC | PRN

## 2012-11-29 MED ORDER — NALOXONE HCL 0.4 MG/ML IJ SOLN: 0.4000 mg | INTRAMUSCULAR | Status: DC | PRN

## 2012-11-29 MED ORDER — ALPRAZOLAM 0.25 MG PO TABS: 0.2500 mg | ORAL_TABLET | Freq: Three times a day (TID) | ORAL | Status: DC | PRN

## 2012-11-29 MED ORDER — SODIUM CHLORIDE 0.9 % IJ SOLN: 9.0000 mL | INTRAMUSCULAR | Status: DC | PRN

## 2012-11-29 MED ORDER — HYDROMORPHONE 0.3 MG/ML IV SOLN
INTRAVENOUS | Status: DC
  Administered 2012-11-29: 13:00:00 via INTRAVENOUS
  Administered 2012-11-29: 2.29 mg via INTRAVENOUS
  Administered 2012-11-29: 0.3 mg via INTRAVENOUS
  Filled 2012-11-29: qty 25

## 2012-11-29 MED ORDER — HYDROXYUREA 500 MG PO CAPS
1000.0000 mg | ORAL_CAPSULE | Freq: Every day | ORAL | Status: DC
  Administered 2012-11-29: 1000 mg via ORAL
  Filled 2012-11-29 (×2): qty 2

## 2012-11-29 MED ORDER — METHADONE HCL 10 MG PO TABS
10.0000 mg | ORAL_TABLET | Freq: Every day | ORAL | Status: DC
  Administered 2012-11-29: 10 mg via ORAL
  Filled 2012-11-29: qty 1

## 2012-11-29 MED ORDER — KETOROLAC TROMETHAMINE 30 MG/ML IJ SOLN: 30.0000 mg | Freq: Four times a day (QID) | INTRAMUSCULAR | Status: DC

## 2012-11-29 MED ORDER — KETOROLAC TROMETHAMINE 30 MG/ML IJ SOLN: 30.0000 mg | Freq: Four times a day (QID) | INTRAMUSCULAR | Status: DC | PRN

## 2012-11-29 MED ORDER — HYDROMORPHONE HCL 4 MG PO TABS
4.0000 mg | ORAL_TABLET | ORAL | Status: DC | PRN
  Administered 2012-11-29: 4 mg via ORAL
  Filled 2012-11-29: qty 1

## 2012-11-29 MED ORDER — DIPHENHYDRAMINE HCL 50 MG/ML IJ SOLN: 12.5000 mg | Freq: Four times a day (QID) | INTRAMUSCULAR | Status: DC | PRN

## 2012-11-29 MED ORDER — DEXTROSE-NACL 5-0.45 % IV SOLN
INTRAVENOUS | Status: DC
  Administered 2012-11-29: 12:00:00 via INTRAVENOUS

## 2012-11-29 MED ORDER — FOLIC ACID 1 MG PO TABS
1.0000 mg | ORAL_TABLET | Freq: Every day | ORAL | Status: DC
  Administered 2012-11-29: 1 mg via ORAL
  Filled 2012-11-29: qty 1

## 2012-11-29 NOTE — Telephone Encounter (Signed)
[facility] "being in a crisis".  Patient C/O pain to "hips, shoulders, and joints".  Pain is rated 4/10.  Patient states he "took dilaudid around 10p.m. Last night".  Patient denies fever, chest pain, nausea/vomiting, diarrhea, or abdominal pain.  I explained that I would notify Dr. Ashley Royalty.  Patient verbalizes understanding.

## 2012-11-29 NOTE — Progress Notes (Signed)
IV RN called back and advised that she is backed up with calls. States she will "try to get over here within the hour."  This RN will continue to monitor the patient.

## 2012-11-29 NOTE — Progress Notes (Signed)
Patient ID: [name], male   DOB: 1971/01/03, 42 y.o.   MRN: [id number] Pt alert and oriented; discharge instructions explained, given, and signed; IV removed with no complications noted; pt states pain level is at 3/10 and states that he is able to manage at home; discharged to home

## 2012-11-29 NOTE — Progress Notes (Signed)
Verbally notified Gwinda Passe, NP, that patient is refusing the toradol.  Per patient "it affected my kidneys the last time."  RN will continue to monitor.

## 2012-11-29 NOTE — Discharge Summary (Signed)
[facility] Discharge Summary   Patient ID: [name] MRN: [id number] DOB/AGE: 41-Aug-1972 41 y.o.  Admit date: 11/29/2012 Discharge date: 11/29/2012  Primary Care Physician:  [name] Auxier A., MD  Admission Diagnoses:  Active Problems: Vaso occlusive crisis:   Dehydration Chronic Pain/ Opiate dependency Medication Non-compliance  Discharge Diagnoses:   Vaso occlusive crisis:   Dehydration Chronic Pain/ Opiate dependency Medication Non-compliance  Discharge Medications:    Medication List    ASK your doctor about these medications       allopurinol 300 MG tablet  Commonly known as:  ZYLOPRIM  Take 300 mg by mouth daily.     ALPRAZolam 0.25 MG tablet  Commonly known as:  XANAX  Take 1 tablet (0.25 mg total) by mouth 3 (three) times daily as needed for anxiety.     folic acid 1 MG tablet  Commonly known as:  FOLVITE  Take 1 tablet (1 mg total) by mouth daily.     HYDROmorphone 4 MG tablet  Commonly known as:  DILAUDID  Take 1 tablet (4 mg total) by mouth every 4 (four) hours as needed for pain.     hydroxyurea 500 MG capsule  Commonly known as:  HYDREA  Take 1,000 mg by mouth daily. May take with food to minimize GI side effects.     ibuprofen 800 MG tablet  Commonly known as:  ADVIL,MOTRIN  Take 1 tablet (800 mg total) by mouth every 8 (eight) hours as needed for pain.     methadone 10 MG tablet  Commonly known as:  DOLOPHINE  Take 1 tablet by mouth daily.     metoprolol 50 MG tablet  Commonly known as:  LOPRESSOR  Take 1 tablet (50 mg total) by mouth 2 (two) times daily.     zolpidem 10 MG tablet  Commonly known as:  AMBIEN  take 1 tablet by mouth at bedtime if needed for sleep         Consults:   None  Significant Diagnostic Studies:  No results found.   [facility] Course:  For complete details please refer to admission H and P, but in brief, [name] started 4 days  having increase in pain in  hips and shoulders. He took his dilaudid on Friday and has an improvement of pain on Saturday. He was out doing errands on Saturday and noted an increase in pain that evening. He came to [facility] for evaluation for and treatment. He was admitted for symptom management.  Treatment included:  Vaso occlusive crisis: He was  treated with Methadone and PCA Dilaudid. Dose 0.2mg  Q with a lock out of 15 in 5hrs. He demanded  11  And 11 deliveries total uses 2.32 mg. Adjunct therapy Toradol and IVF. Pain down to 2/10 Clinical Dehydration:  Treated with IVF and increased oral fluid intake.  Chronic Pain/ Opiate dependency: Methadone to be continued on an every other day basis.    Physical Exam at Discharge:  BP 115/58  Pulse 70  Temp(Src) 98.8 F (37.1 C) (Oral)  Resp 20  Ht 5\' 10"  (1.778 m)  Wt 173 lb (78.472 kg)  BMI 24.82 kg/m2  SpO2 96%  Gen: Alert, cooperative, no acute distress secondary to pain Cardiovascular: Regular rate and rhythm, S1 and S2 normal, no murmur, rub or gallop  Respiratory:Clear to auscultation bilaterally, respirations unlabored  Gastrointestinal:Soft, non-tender, bowel sounds active all four quadrants Extremities:normal, atraumatic, no cyanosis or edema  Pulses: 2+ and symmetric all  extremities     Disposition at Discharge: 01-Home or Self Care  Discharge Orders:      Future Appointments Provider Department Dept Phone   04/20/2013 3:00 PM Altha Harm, MD [facility] [phone]      Condition at Discharge:   Stable  Time spent on Discharge:  Greater than 30 minutes.  SignedGrayce Sessions 11/29/2012, 6:43 PM

## 2012-11-29 NOTE — H&P (Signed)
[facility] History and Physical  [name] ZOX:[id number] DOB: 1970/07/25 DOA: 11/29/2012   PCP: Adesuwa Osgood A., MD   Chief Complaint: Pain in hips and shoulders x 3 days  HPI:Pt states that he was doing well until last Friday (4 days ago) when he noted an increase in pain in hips and shoulders. He took his dilaudid on Friday and has an improvement of pain on Saturday. He was out doing errands on Saturday and noted an increase in pain that evening. Pt states that he worked yesterday and has been taking his dilaudid as prescribed but has not been taking Methadone. His pain has not been controlled. He localizes his pain to hips, shoulder and occasionally ankles. He rates pain currently at 10/10 and throbbing in nature and non-radiating. He has no associated symptoms and he does not identify any palliative or provocative features.  Pt also reports that he has not been taking either his Hydrea or Allopurinol as prescribed, and that he has been using the Allopurinol  in a fashion of a rescue medication: i.e. Only when he has joint pain. Pt states that he has not been seeing the Therapist as time does not permit.   Review of Systems:  Constitutional: No weight loss, night sweats, Fevers, chills, fatigue.  HEENT: No headaches, dizziness, seizures, vision changes, difficulty swallowing,Tooth/dental problems,Sore throat, No sneezing, itching, ear ache, nasal congestion, post nasal drip,  Cardio-vascular: No chest pain, Orthopnea, PND, swelling in lower extremities, anasarca, dizziness, palpitations  GI: No heartburn, indigestion, abdominal pain, nausea, vomiting, diarrhea, change in bowel habits, loss of appetite  Resp: No shortness of breath with exertion or at rest. No excess mucus, no productive cough, No non-productive cough, No coughing up of blood.No change in color of mucus.No wheezing.No chest wall deformity  Skin: no rash or lesions.  GU: no dysuria, change in color of  urine, no urgency or frequency. No flank pain.  Psych: No change in mood or affect. No depression or anxiety. No memory loss.    Past Medical History  Diagnosis Date  . CHF (congestive heart failure)   . Gouty arthropathy   . Chronic fatigue syndrome   . Sickle cell anemia   . Anxiety   . Depression    Past Surgical History  Procedure Laterality Date  . Appendectomy    . Cholecystectomy    . Splenectomy     Social History:  reports that he has never smoked. He does not have any smokeless tobacco history on file. He reports that he does not drink alcohol or use illicit drugs.  No Known Allergies  Family History  Problem Relation Age of Onset  . Sickle cell anemia    . Sickle cell trait Mother     Prior to Admission medications   Medication Sig Start Date End Date Taking? Authorizing Provider  allopurinol (ZYLOPRIM) 300 MG tablet Take 300 mg by mouth daily.   Yes Historical Provider, MD  ALPRAZolam (XANAX) 0.25 MG tablet Take 1 tablet (0.25 mg total) by mouth 3 (three) times daily as needed for anxiety. 09/23/12  Yes Grayce Sessions, NP  folic acid (FOLVITE) 1 MG tablet Take 1 tablet (1 mg total) by mouth daily. 06/21/12  Yes Altha Harm, MD  HYDROmorphone (DILAUDID) 4 MG tablet Take 1 tablet (4 mg total) by mouth every 4 (four) hours as needed for pain. 11/15/12  Yes Grayce Sessions, NP  hydroxyurea (HYDREA) 500 MG capsule Take 1,000 mg by mouth daily. May  take with food to minimize GI side effects.   Yes Historical Provider, MD  ibuprofen (ADVIL,MOTRIN) 800 MG tablet Take 1 tablet (800 mg total) by mouth every 8 (eight) hours as needed for pain. 09/23/12  Yes Grayce Sessions, NP  methadone (DOLOPHINE) 10 MG tablet Take 1 tablet by mouth daily. 06/11/12  Yes Historical Provider, MD  metoprolol (LOPRESSOR) 50 MG tablet Take 1 tablet (50 mg total) by mouth 2 (two) times daily. 10/18/12  Yes Altha Harm, MD  zolpidem (AMBIEN) 10 MG tablet take 1 tablet by mouth  at bedtime if needed for sleep 10/12/12  Yes Grayce Sessions, NP   Physical Exam: Filed Vitals:   11/29/12 1006 11/29/12 1034  BP: 134/66   Pulse: 91   Temp: 98.9 F (37.2 C)   TempSrc: Oral   Resp: 16   Height: 5\' 10"  (1.778 m)   Weight: 173 lb (78.472 kg)   SpO2: 91% 96%   BP 134/66  Pulse 91  Temp(Src) 98.9 F (37.2 C) (Oral)  Resp 16  Ht 5\' 10"  (1.778 m)  Wt 173 lb (78.472 kg)  BMI 24.82 kg/m2  SpO2 96%  General Appearance:    Alert, cooperative, mild distress secondary to pain, appears stated age  Head:    Normocephalic, without obvious abnormality, atraumatic  Eyes:    PERRL, conjunctiva/corneas clear, EOM's intact, fundi    benign, both eyes anicteric.      Throat:   Lips, mucosa, and tongue dry; teeth and gums normal  Neck:   Supple, symmetrical, trachea midline, no adenopathy;       thyroid:  No enlargement/tenderness/nodules; no carotid   bruit or JVD  Back:     Symmetric, no curvature, ROM normal, no CVA tenderness  Lungs:     Clear to auscultation bilaterally, respirations unlabored  Chest wall:    No tenderness or deformity  Heart:    Regular rate and rhythm, S1 and S2 normal, no murmur, rub   or gallop  Abdomen:     Soft, non-tender, bowel sounds active all four quadrants,    no masses, no organomegaly  Extremities:   Extremities normal, atraumatic, no cyanosis or edema  Pulses:   2+ and symmetric all extremities  Skin:   Skin color, texture, turgor normal, no rashes or lesions  Lymph nodes:   Cervical, supraclavicular, and axillary nodes normal  Neurologic:   CNII-XII intact. Normal strength, sensation and reflexes      throughout    Labs on Admission:    Assessment/Plan: Active Problems: 1. Hb SS with crisis: Pt will be treated with Methadone and Dilaudid. Pt will also receive Toradol and IVF.   2. Dehydration: Pt presents with mild dehydration. Will treat with IVF and increased oral fluid intake.  3. Chronic Pain/ Opiate dependency: Pt was  taking Methadone on a daily basis until about 1-2 months ago when he felt that his pain was under good control and changed to Methadone on an every other day basis. He states that he does not take Methadone when he has crisis and is only using Dilaudid as he is concerned about mixing there two medications. He states that the Dilaudid alone has not controlled his pain of crisis.  4. Medication Non-compliance: Pt has had several conversations with this provider about how he takes his medication. Pt states that it is easy to be non-compliant with medications when he is feeling better as he doesn't feel that he needs to take them. He  has a poor understanding of how his medications impact his health maintenance and control of his disease and symptoms.   Time spend: 40 minutes Code Status: Full COde Family Communication: N/A Disposition Plan: Undetermined  [name] Noe A., MD  Pager [phone]  If 7PM-7AM, please contact night-coverage [email or web address] Password [email or web address] 11/29/2012, 11:15 AM

## 2012-12-07 ENCOUNTER — Telehealth: Payer: Self-pay | Admitting: Internal Medicine

## 2012-12-07 ENCOUNTER — Other Ambulatory Visit: Payer: Self-pay | Admitting: Internal Medicine

## 2012-12-07 DIAGNOSIS — D571 Sickle-cell disease without crisis: Secondary | ICD-10-CM

## 2012-12-07 MED ORDER — METHADONE HCL 10 MG PO TABS: 10.0000 mg | ORAL_TABLET | Freq: Every day | ORAL | Status: DC

## 2012-12-07 NOTE — Progress Notes (Signed)
Pt called requesting Methadone refill. Pt's dose was changed on last visit to 10 mg Q HS. Will refill Methadone 10 mg # 30 to be taken 1 tab (10 mg) q HS.

## 2012-12-11 NOTE — Discharge Summary (Signed)
Pt examined and discussed with NP Gwinda Passe. Agree with discharge to home.

## 2013-01-06 ENCOUNTER — Telehealth: Payer: Self-pay | Admitting: Internal Medicine

## 2013-01-06 ENCOUNTER — Other Ambulatory Visit: Payer: Self-pay | Admitting: Internal Medicine

## 2013-01-06 DIAGNOSIS — Z79891 Long term (current) use of opiate analgesic: Secondary | ICD-10-CM

## 2013-01-06 DIAGNOSIS — Z604 Social exclusion and rejection: Secondary | ICD-10-CM

## 2013-01-06 DIAGNOSIS — D5700 Hb-SS disease with crisis, unspecified: Secondary | ICD-10-CM

## 2013-01-06 DIAGNOSIS — F411 Generalized anxiety disorder: Secondary | ICD-10-CM

## 2013-01-06 DIAGNOSIS — G4700 Insomnia, unspecified: Secondary | ICD-10-CM

## 2013-01-06 DIAGNOSIS — D571 Sickle-cell disease without crisis: Secondary | ICD-10-CM

## 2013-01-06 MED ORDER — HYDROMORPHONE HCL 4 MG PO TABS: 4.0000 mg | ORAL_TABLET | ORAL | Status: DC | PRN

## 2013-01-06 MED ORDER — METHADONE HCL 10 MG PO TABS: 10.0000 mg | ORAL_TABLET | Freq: Every day | ORAL | Status: DC

## 2013-01-06 MED ORDER — ALPRAZOLAM 0.25 MG PO TABS: 0.2500 mg | ORAL_TABLET | Freq: Three times a day (TID) | ORAL | Status: DC | PRN

## 2013-01-06 NOTE — Progress Notes (Signed)
Prescriptions refilled for Xanax 0.25 mg TID PRN anxiety #90, Methadone 10 mg #30 and Dilaudid 4 mg # 90 tho be taken q 4 hours as needed for pain. Needs prescription drug monitoring screen.

## 2013-02-08 ENCOUNTER — Telehealth: Payer: Self-pay | Admitting: Internal Medicine

## 2013-02-08 ENCOUNTER — Telehealth: Payer: Self-pay | Admitting: *Deleted

## 2013-02-08 NOTE — Telephone Encounter (Signed)
Routing to Dr. Ashley RoyaltyMatthews for Refill of Dilaudid, Methadone, and Ambien

## 2013-02-08 NOTE — Telephone Encounter (Signed)
Pt states he needs refill on medication as well as he has been in pain since last month due to the cold weather changes. I advised the pt to come in today to see Dr. Ashley RoyaltyMatthews or day hospital. He states he can not come in today needs a morning appointment. He has an appointment on 02/09/13

## 2013-02-09 ENCOUNTER — Ambulatory Visit (HOSPITAL_COMMUNITY)
Admission: RE | Admit: 2013-02-09 | Discharge: 2013-02-09 | Disposition: A | Discharge status: Home / Self Care | Payer: BC Managed Care – PPO | Source: Ambulatory Visit | Attending: Internal Medicine | Admitting: Internal Medicine

## 2013-02-09 ENCOUNTER — Encounter (HOSPITAL_COMMUNITY): Payer: Self-pay | Admitting: *Deleted

## 2013-02-09 ENCOUNTER — Ambulatory Visit (HOSPITAL_COMMUNITY)
Admission: AD | Admit: 2013-02-09 | Discharge: 2013-02-09 | Disposition: A | Discharge status: Home / Self Care | Payer: BC Managed Care – PPO | Source: Ambulatory Visit | Attending: Internal Medicine | Admitting: Internal Medicine

## 2013-02-09 ENCOUNTER — Inpatient Hospital Stay (HOSPITAL_COMMUNITY)
Admission: AD | Admit: 2013-02-09 | Discharge: 2013-02-13 | DRG: 683 | Disposition: A | Discharge status: Home / Self Care | Payer: BC Managed Care – PPO | Source: Ambulatory Visit | Attending: Internal Medicine | Admitting: Internal Medicine

## 2013-02-09 ENCOUNTER — Ambulatory Visit (INDEPENDENT_AMBULATORY_CARE_PROVIDER_SITE_OTHER): Payer: BC Managed Care – PPO | Admitting: Internal Medicine

## 2013-02-09 VITALS — BP 120/64 | HR 88 | Temp 99.1°F | Resp 16 | Ht 71.0 in | Wt 188.0 lb

## 2013-02-09 DIAGNOSIS — L298 Other pruritus: Secondary | ICD-10-CM

## 2013-02-09 DIAGNOSIS — R0902 Hypoxemia: Secondary | ICD-10-CM

## 2013-02-09 DIAGNOSIS — R110 Nausea: Secondary | ICD-10-CM

## 2013-02-09 DIAGNOSIS — D5700 Hb-SS disease with crisis, unspecified: Secondary | ICD-10-CM

## 2013-02-09 DIAGNOSIS — R0602 Shortness of breath: Secondary | ICD-10-CM | POA: Diagnosis present

## 2013-02-09 DIAGNOSIS — Z7901 Long term (current) use of anticoagulants: Secondary | ICD-10-CM

## 2013-02-09 DIAGNOSIS — Z79891 Long term (current) use of opiate analgesic: Secondary | ICD-10-CM

## 2013-02-09 DIAGNOSIS — Z299 Encounter for prophylactic measures, unspecified: Secondary | ICD-10-CM

## 2013-02-09 DIAGNOSIS — F3289 Other specified depressive episodes: Secondary | ICD-10-CM | POA: Diagnosis present

## 2013-02-09 DIAGNOSIS — K5900 Constipation, unspecified: Secondary | ICD-10-CM

## 2013-02-09 DIAGNOSIS — M109 Gout, unspecified: Secondary | ICD-10-CM | POA: Diagnosis present

## 2013-02-09 DIAGNOSIS — G4700 Insomnia, unspecified: Secondary | ICD-10-CM

## 2013-02-09 DIAGNOSIS — T50904A Poisoning by unspecified drugs, medicaments and biological substances, undetermined, initial encounter: Secondary | ICD-10-CM

## 2013-02-09 DIAGNOSIS — T50905A Adverse effect of unspecified drugs, medicaments and biological substances, initial encounter: Secondary | ICD-10-CM

## 2013-02-09 DIAGNOSIS — D571 Sickle-cell disease without crisis: Secondary | ICD-10-CM | POA: Diagnosis present

## 2013-02-09 DIAGNOSIS — D72829 Elevated white blood cell count, unspecified: Secondary | ICD-10-CM | POA: Diagnosis present

## 2013-02-09 DIAGNOSIS — N179 Acute kidney failure, unspecified: Principal | ICD-10-CM | POA: Diagnosis present

## 2013-02-09 DIAGNOSIS — E860 Dehydration: Secondary | ICD-10-CM

## 2013-02-09 DIAGNOSIS — Z9981 Dependence on supplemental oxygen: Principal | ICD-10-CM

## 2013-02-09 DIAGNOSIS — L299 Pruritus, unspecified: Secondary | ICD-10-CM

## 2013-02-09 DIAGNOSIS — R69 Illness, unspecified: Secondary | ICD-10-CM

## 2013-02-09 DIAGNOSIS — R5382 Chronic fatigue, unspecified: Secondary | ICD-10-CM | POA: Diagnosis present

## 2013-02-09 DIAGNOSIS — F411 Generalized anxiety disorder: Secondary | ICD-10-CM | POA: Diagnosis present

## 2013-02-09 DIAGNOSIS — F329 Major depressive disorder, single episode, unspecified: Secondary | ICD-10-CM | POA: Diagnosis present

## 2013-02-09 DIAGNOSIS — Z0289 Encounter for other administrative examinations: Secondary | ICD-10-CM

## 2013-02-09 DIAGNOSIS — G9332 Myalgic encephalomyelitis/chronic fatigue syndrome: Secondary | ICD-10-CM | POA: Diagnosis present

## 2013-02-09 DIAGNOSIS — E871 Hypo-osmolality and hyponatremia: Secondary | ICD-10-CM | POA: Diagnosis present

## 2013-02-09 DIAGNOSIS — E875 Hyperkalemia: Secondary | ICD-10-CM | POA: Diagnosis present

## 2013-02-09 DIAGNOSIS — D649 Anemia, unspecified: Secondary | ICD-10-CM

## 2013-02-09 HISTORY — DX: Pain in joints of unspecified hand: M25.549

## 2013-02-09 HISTORY — DX: Hyperkalemia: E87.5

## 2013-02-09 LAB — PREPARE RBC (CROSSMATCH)

## 2013-02-09 LAB — COMPREHENSIVE METABOLIC PANEL
ALBUMIN: 3.1 g/dL — AB (ref 3.5–5.2)
ALT: 58 U/L — AB (ref 0–53)
AST: 188 U/L — ABNORMAL HIGH (ref 0–37)
Alkaline Phosphatase: 116 U/L (ref 39–117)
BUN: 34 mg/dL — AB (ref 6–23)
CO2: 19 mEq/L (ref 19–32)
Calcium: 8.6 mg/dL (ref 8.4–10.5)
Chloride: 102 mEq/L (ref 96–112)
Creatinine, Ser: 1.57 mg/dL — ABNORMAL HIGH (ref 0.50–1.35)
GFR calc non Af Amer: 53 mL/min — ABNORMAL LOW (ref >90)
GFR, EST AFRICAN AMERICAN: 62 mL/min — AB (ref >90)
GLUCOSE: 82 mg/dL (ref 70–99)
POTASSIUM: 5 meq/L (ref 3.7–5.3)
Sodium: 135 mEq/L — ABNORMAL LOW (ref 137–147)
TOTAL PROTEIN: 7.7 g/dL (ref 6.0–8.3)
Total Bilirubin: 12 mg/dL — ABNORMAL HIGH (ref 0.3–1.2)

## 2013-02-09 LAB — CBC WITH DIFFERENTIAL/PLATELET
BASOS ABS: 0 10*3/uL (ref 0.0–0.1)
BLASTS: 0 %
Band Neutrophils: 0 % (ref 0–10)
Basophils Relative: 0 % (ref 0–1)
Eosinophils Absolute: 0.6 10*3/uL (ref 0.0–0.7)
Eosinophils Relative: 3 % (ref 0–5)
HCT: 18.3 % — ABNORMAL LOW (ref 39.0–52.0)
Hemoglobin: 6.7 g/dL — CL (ref 13.0–17.0)
LYMPHS ABS: 5.2 10*3/uL — AB (ref 0.7–4.0)
LYMPHS PCT: 24 % (ref 12–46)
MCH: 35.1 pg — AB (ref 26.0–34.0)
MCHC: 36.6 g/dL — AB (ref 30.0–36.0)
MCV: 95.8 fL (ref 78.0–100.0)
MYELOCYTES: 4 %
Metamyelocytes Relative: 0 %
Monocytes Absolute: 1.9 10*3/uL — ABNORMAL HIGH (ref 0.1–1.0)
Monocytes Relative: 9 % (ref 3–12)
NEUTROS ABS: 13.8 10*3/uL — AB (ref 1.7–7.7)
NEUTROS PCT: 60 % (ref 43–77)
PLATELETS: 237 10*3/uL (ref 150–400)
PROMYELOCYTES ABS: 0 %
RBC: 1.91 MIL/uL — ABNORMAL LOW (ref 4.22–5.81)
RDW: 33.3 % — AB (ref 11.5–15.5)
WBC: 21.5 10*3/uL — ABNORMAL HIGH (ref 4.0–10.5)
nRBC: 53 /100 WBC — ABNORMAL HIGH

## 2013-02-09 LAB — URINALYSIS, ROUTINE W REFLEX MICROSCOPIC
Glucose, UA: NEGATIVE mg/dL
Ketones, ur: NEGATIVE mg/dL
Leukocytes, UA: NEGATIVE
Nitrite: NEGATIVE
Protein, ur: 100 mg/dL — AB
Specific Gravity, Urine: 1.01 (ref 1.005–1.030)
Urobilinogen, UA: 2 mg/dL — ABNORMAL HIGH (ref 0.0–1.0)
pH: 6 (ref 5.0–8.0)

## 2013-02-09 LAB — URINE MICROSCOPIC-ADD ON

## 2013-02-09 LAB — RETICULOCYTES
RBC.: 1.91 MIL/uL — ABNORMAL LOW (ref 4.22–5.81)
Retic Count, Absolute: 477.5 10*3/uL — ABNORMAL HIGH (ref 19.0–186.0)
Retic Ct Pct: 23.9 % — ABNORMAL HIGH (ref 0.4–3.1)

## 2013-02-09 LAB — MRSA PCR SCREENING: MRSA BY PCR: NEGATIVE

## 2013-02-09 LAB — D-DIMER, QUANTITATIVE (NOT AT ARMC): D DIMER QUANT: 3.88 ug{FEU}/mL — AB (ref 0.00–0.48)

## 2013-02-09 MED ORDER — ACETAMINOPHEN 325 MG PO TABS
325.0000 mg | ORAL_TABLET | Freq: Once | ORAL | Status: AC
  Administered 2013-02-09: 325 mg via ORAL
  Filled 2013-02-09: qty 1

## 2013-02-09 MED ORDER — DIPHENHYDRAMINE HCL 50 MG/ML IJ SOLN
25.0000 mg | Freq: Once | INTRAMUSCULAR | Status: AC
  Administered 2013-02-09: 25 mg via INTRAVENOUS
  Filled 2013-02-09: qty 1

## 2013-02-09 MED ORDER — SODIUM CHLORIDE 0.9 % IV SOLN: INTRAVENOUS | Status: DC

## 2013-02-09 MED ORDER — SODIUM CHLORIDE 0.9 % IJ SOLN: 3.0000 mL | INTRAMUSCULAR | Status: DC | PRN

## 2013-02-09 MED ORDER — HYDROMORPHONE 0.3 MG/ML IV SOLN
INTRAVENOUS | Status: DC
  Administered 2013-02-09 (×2): via INTRAVENOUS
  Administered 2013-02-09: 5.99 mg via INTRAVENOUS
  Administered 2013-02-10: 13:00:00 via INTRAVENOUS
  Administered 2013-02-10: 5.99 mg via INTRAVENOUS
  Administered 2013-02-10 (×2): via INTRAVENOUS
  Administered 2013-02-10: 7.13 mg via INTRAVENOUS
  Administered 2013-02-10: 0.999 mg via INTRAVENOUS
  Administered 2013-02-10: 8.49 mg via INTRAVENOUS
  Administered 2013-02-11: 2.9 mg via INTRAVENOUS
  Administered 2013-02-11: 4.9 mg via INTRAVENOUS
  Administered 2013-02-11: 02:00:00 via INTRAVENOUS
  Administered 2013-02-11: 2.99 mg via INTRAVENOUS
  Filled 2013-02-09 (×7): qty 25

## 2013-02-09 MED ORDER — ONDANSETRON HCL 4 MG/2ML IJ SOLN: 4.0000 mg | Freq: Four times a day (QID) | INTRAMUSCULAR | Status: DC | PRN

## 2013-02-09 MED ORDER — METHADONE HCL 10 MG PO TABS: 10.0000 mg | ORAL_TABLET | Freq: Every day | ORAL | Status: DC

## 2013-02-09 MED ORDER — SODIUM CHLORIDE 0.9 % IJ SOLN
3.0000 mL | Freq: Two times a day (BID) | INTRAMUSCULAR | Status: DC
  Administered 2013-02-09: 3 mL via INTRAVENOUS

## 2013-02-09 MED ORDER — FOLIC ACID 1 MG PO TABS
1.0000 mg | ORAL_TABLET | Freq: Every day | ORAL | Status: DC
  Administered 2013-02-09 – 2013-02-13 (×5): 1 mg via ORAL
  Filled 2013-02-09 (×5): qty 1

## 2013-02-09 MED ORDER — SENNOSIDES-DOCUSATE SODIUM 8.6-50 MG PO TABS
1.0000 | ORAL_TABLET | Freq: Two times a day (BID) | ORAL | Status: DC
  Administered 2013-02-09 – 2013-02-12 (×6): 1 via ORAL
  Filled 2013-02-09 (×9): qty 1

## 2013-02-09 MED ORDER — HYDROMORPHONE HCL 4 MG PO TABS
4.0000 mg | ORAL_TABLET | ORAL | Status: DC | PRN
  Administered 2013-02-09: 4 mg via ORAL
  Filled 2013-02-09: qty 1

## 2013-02-09 MED ORDER — HYDROMORPHONE HCL PF 2 MG/ML IJ SOLN
0.5000 mg | Freq: Once | INTRAMUSCULAR | Status: AC
  Administered 2013-02-09: 0.5 mg via INTRAVENOUS

## 2013-02-09 MED ORDER — DIPHENHYDRAMINE HCL 25 MG PO CAPS: 25.0000 mg | ORAL_CAPSULE | Freq: Four times a day (QID) | ORAL | Status: DC | PRN

## 2013-02-09 MED ORDER — ZOLPIDEM TARTRATE 10 MG PO TABS: ORAL_TABLET | ORAL | Status: DC

## 2013-02-09 MED ORDER — HYDROMORPHONE HCL PF 2 MG/ML IJ SOLN
1.0000 mg | Freq: Once | INTRAMUSCULAR | Status: AC
  Administered 2013-02-09: 1 mg via INTRAVENOUS
  Filled 2013-02-09: qty 1

## 2013-02-09 MED ORDER — HYDROMORPHONE HCL PF 2 MG/ML IJ SOLN
INTRAMUSCULAR | Status: AC
  Administered 2013-02-09: 0.5 mg via INTRAVENOUS
  Filled 2013-02-09: qty 1

## 2013-02-09 MED ORDER — SODIUM CHLORIDE 0.9 % IV SOLN
250.0000 mL | INTRAVENOUS | Status: DC | PRN
  Administered 2013-02-09: 250 mL via INTRAVENOUS

## 2013-02-09 MED ORDER — SODIUM CHLORIDE 0.9 % IJ SOLN: 9.0000 mL | INTRAMUSCULAR | Status: DC | PRN

## 2013-02-09 MED ORDER — ENOXAPARIN SODIUM 40 MG/0.4ML ~~LOC~~ SOLN
40.0000 mg | SUBCUTANEOUS | Status: DC
  Administered 2013-02-09 – 2013-02-11 (×3): 40 mg via SUBCUTANEOUS
  Filled 2013-02-09 (×5): qty 0.4

## 2013-02-09 MED ORDER — NALOXONE HCL 0.4 MG/ML IJ SOLN: 0.4000 mg | INTRAMUSCULAR | Status: DC | PRN

## 2013-02-09 MED ORDER — POLYETHYLENE GLYCOL 3350 17 G PO PACK
17.0000 g | PACK | Freq: Every day | ORAL | Status: DC | PRN
  Filled 2013-02-09: qty 1

## 2013-02-09 MED ORDER — HYDROMORPHONE HCL PF 2 MG/ML IJ SOLN
0.5000 mg | Freq: Once | INTRAMUSCULAR | Status: AC
  Administered 2013-02-09: 0.5 mg via INTRAVENOUS
  Filled 2013-02-09: qty 1

## 2013-02-09 MED ORDER — HYDROMORPHONE HCL 4 MG PO TABS: 4.0000 mg | ORAL_TABLET | ORAL | Status: DC | PRN

## 2013-02-09 NOTE — Progress Notes (Signed)
Dr. Ashley RoyaltyMatthews Notified of critical hemoglobin of 6.7 and patient's compliant of hip pain that he is rating 5/10 on pain scale.  RN will continue to monitor.

## 2013-02-09 NOTE — Progress Notes (Signed)
CRITICAL VALUE ALERT  Critical value received: Hemoglobin 6.7  Date of notification:  02/09/2013  Time of notification:  1053  Critical value read back:yes  Nurse who received alert:  Lanae Boastiffany Tatum, RN  Will notify MD verbally

## 2013-02-09 NOTE — Progress Notes (Signed)
Prescriptions printed for methadone, dilaudid, and ambien. Placed in prescription folder in primary care clinic for patient to pick up upon discharge from inpatient.

## 2013-02-09 NOTE — Progress Notes (Signed)
Patient reports history of reaction with blood transfusion of itching and swelling of throat. MD notified. Orders received and initiated. To continue to monitor.

## 2013-02-09 NOTE — Progress Notes (Signed)
Patient c/o pain rating 5/10. MD notifed. Orders received and initiated. To continue to monitor.

## 2013-02-09 NOTE — Progress Notes (Signed)
   Subjective:    Patient ID: [name], male    DOB: 1970-03-20, 43 y.o.   MRN: [id number]  HPI: Pt presents today with complaints of fatigue, SOB and occasional dizziness. He states that the overwhelming symptom is fatigue and weakness. He also stated that he feels that his heart rate has been increasing with activity in the last week, and had palpitations one day last week. He denies calf pain or swelling but has had joint pain in his legs. He states that he has not noted any dark stools or any blood loss. But was significanately jaundiced last week. He denies any cough, fevers, chills, N/V/D, chest pains  On checking of vital signs, he was found to be Hypoxic at 83%.     Review of Systems  Constitutional: Positive for fatigue.  HENT: Negative.   Eyes: Negative.   Respiratory: Positive for shortness of breath.   Cardiovascular: Negative.   Gastrointestinal: Negative.   Endocrine: Negative.   Genitourinary: Negative.   Musculoskeletal: Positive for arthralgias and myalgias.  Skin: Negative.   Allergic/Immunologic: Negative.   Neurological: Positive for dizziness and weakness (generalized).  Hematological: Negative.   Psychiatric/Behavioral: Negative.        Objective:   Physical Exam  Constitutional: He is oriented to person, place, and time. He appears well-developed and well-nourished.  HENT:  Head: Atraumatic.  Eyes: Conjunctivae and EOM are normal. Pupils are equal, round, and reactive to light. Scleral icterus is present.  Neck: Normal range of motion. Neck supple.  Cardiovascular: Normal rate and regular rhythm.  Exam reveals no gallop and no friction rub.   No murmur heard. Pulmonary/Chest: Effort normal and breath sounds normal. He has no wheezes. He has no rales. He exhibits no tenderness.  Abdominal: Soft. Bowel sounds are normal. He exhibits no mass.  Musculoskeletal: Normal range of motion.  Neurological: He is alert and oriented to person, place, and time.   Skin: Skin is warm and dry.  Psychiatric: He has a normal mood and affect. His behavior is normal. Judgment and thought content normal.          Assessment & Plan:  1. 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 Will check CBC with diff stat and T&C for possible transfusion. Will also check CXR and D-dimer although PE is much lower on differential. Will transfer to day hospital for labs and possible transfusion.  2. Hb SS with pain: pt states that he has had pain in the last 2 weeks which he has treated with his oral medication. Will refill prescriptions for Dilaudid and Methadone today.  3. Insomnia: Continue Ambien

## 2013-02-09 NOTE — Discharge Instructions (Signed)
Sickle Cell Anemia °Sickle cell anemia needs regular medical care by your caregiver and awareness by you when to seek medical care. Pain is a common problem in children with sickle cell disease. This usually starts at less than 42 year of age. Pain can occur nearly anywhere in the body, but most commonly happens in the extremities, back, chest, or belly (abdomen). Pain episodes can start suddenly or may follow an illness. These attacks can appear as decreased activity, loss of appetite, change in behavior, or simply complaints of pain. °DIAGNOSIS  °· Specialized blood and gene testing can help make this diagnosis early in the disease. Blood tests may then be done to watch blood levels. °· Specialized brain scans are done when there are problems in the brain during a crisis. °· Lung testing may be done later in the disease. °HOME CARE INSTRUCTIONS  °· Maintain good hydration. Increase your child's fluid intake in hot weather and during exercise. °· Avoid smoking around your child. Smoking lowers the oxygen in the blood and can cause sickling. °· Control pain. Only give your child over-the-counter or prescription medicines for pain, discomfort, or fever as directed by their caregiver. Do not give aspirin to children because of the association with Reye's syndrome. °· Keep regular health care checks to keep a proper red blood cell (hemoglobin) level. A moderate anemia level protects against sickling crises. °· You or your child should receive all the same immunizations and care as the people around them. °· Moms should breastfeed their babies if possible. Use formulas with iron added if breastfeeding is not possible. Additional iron should not be given unless there is a lack of it. People with SCD build up iron faster than normal. Give folic acid and additional vitamins as directed. °· If you or your child has been prescribed antibiotics or other medications to prevent problems, give them as directed. °· Summer camps  are available for children with SCD. They may help young people deal with their disease. The camps introduce them to other children with the same condition. °· Young people with SCD may become frustrated or angry at their disease. This can cause rebellion and refusal to follow medical care. Help groups or counseling may help with these problems. °· Make sure your child wears a medical alert bracelet. When traveling, keep your medical information, caregiver's names, and the medications your child takes with you at all times. °SEEK IMMEDIATE MEDICAL CARE IF:  °· You or your child feels dizzy or faint. °· You or your child develops a new onset of abdominal pain, especially on the left side near the stomach area. °· You or your child develops a persistent, often uncomfortable and painful penile erection. This is called priapism. Always check young boys for this. It is often embarrassing for them and they may not bring it to your attention. This is a medical emergency and needs immediate treatment. If this is not treated it will lead to impotence. °· You or your child develops numbness in or has a hard time moving arms and legs. °· You or your child has a hard time with speech. °· You have a fever. °· You or your child develops signs of infection (chills, lethargy, irritability, poor eating, vomiting). The younger the child, the more you should be concerned. °· With fevers, do not give medications to lower the fever right away. This could cover up a problem that is developing. Notify your caregiver. °· You or your child develops pain that is   not helped with medicine. °· You or your child develops shortness of breath, or is coughing up pus-like or bloody sputum. °· You or your child develops any problems that are new and are causing you to worry. °Document Released: 04/30/2005 Document Revised: 04/14/2011 Document Reviewed: 09/01/2012 °[employer]® Patient Information ©2014 [employer]. ° °

## 2013-02-09 NOTE — Telephone Encounter (Signed)
Completed during office visit.

## 2013-02-09 NOTE — Progress Notes (Signed)
[facility]. Report called to [employer]Carolyn RN. Patient received 1 Unit PRBC, Pre-meds given, patient tolerated well. Patient received IV dilaudid for pain to BLE rating 5/10, patient continues to c/o pain. VSS. Patient transported to room 1303, connected to O2 @2L  via nasal canula, with belongings via wheelchair by nursing staff. Patient left sitting safely at bedside. Nursing staff aware of patient arrival and c/o pain, noted orders for PCA for pain. Patient aware.

## 2013-02-10 ENCOUNTER — Inpatient Hospital Stay (HOSPITAL_COMMUNITY): Payer: BC Managed Care – PPO

## 2013-02-10 ENCOUNTER — Ambulatory Visit (HOSPITAL_COMMUNITY): Payer: BC Managed Care – PPO

## 2013-02-10 ENCOUNTER — Encounter (HOSPITAL_COMMUNITY): Payer: Self-pay | Admitting: Internal Medicine

## 2013-02-10 DIAGNOSIS — E860 Dehydration: Secondary | ICD-10-CM | POA: Diagnosis present

## 2013-02-10 DIAGNOSIS — R0602 Shortness of breath: Secondary | ICD-10-CM | POA: Diagnosis present

## 2013-02-10 DIAGNOSIS — D5700 Hb-SS disease with crisis, unspecified: Secondary | ICD-10-CM

## 2013-02-10 LAB — COMPREHENSIVE METABOLIC PANEL
ALT: 54 U/L — ABNORMAL HIGH (ref 0–53)
AST: 164 U/L — ABNORMAL HIGH (ref 0–37)
Albumin: 2.6 g/dL — ABNORMAL LOW (ref 3.5–5.2)
Alkaline Phosphatase: 97 U/L (ref 39–117)
BUN: 24 mg/dL — AB (ref 6–23)
CALCIUM: 8.2 mg/dL — AB (ref 8.4–10.5)
CO2: 19 mEq/L (ref 19–32)
Chloride: 111 mEq/L (ref 96–112)
Creatinine, Ser: 1.15 mg/dL (ref 0.50–1.35)
GFR calc non Af Amer: 78 mL/min — ABNORMAL LOW (ref >90)
GFR, EST AFRICAN AMERICAN: 90 mL/min — AB (ref >90)
GLUCOSE: 98 mg/dL (ref 70–99)
Potassium: 5.3 mEq/L (ref 3.7–5.3)
Sodium: 143 mEq/L (ref 137–147)
TOTAL PROTEIN: 6.8 g/dL (ref 6.0–8.3)
Total Bilirubin: 6.6 mg/dL — ABNORMAL HIGH (ref 0.3–1.2)

## 2013-02-10 LAB — CBC WITH DIFFERENTIAL/PLATELET
BAND NEUTROPHILS: 0 % (ref 0–10)
BASOS ABS: 0 10*3/uL (ref 0.0–0.1)
BASOS PCT: 0 % (ref 0–1)
Blasts: 0 %
Eosinophils Absolute: 0.7 10*3/uL (ref 0.0–0.7)
Eosinophils Relative: 4 % (ref 0–5)
HCT: 20.2 % — ABNORMAL LOW (ref 39.0–52.0)
Hemoglobin: 6.9 g/dL — CL (ref 13.0–17.0)
LYMPHS ABS: 6.5 10*3/uL — AB (ref 0.7–4.0)
LYMPHS PCT: 37 % (ref 12–46)
MCH: 34.5 pg — AB (ref 26.0–34.0)
MCHC: 34.2 g/dL (ref 30.0–36.0)
MCV: 101 fL — ABNORMAL HIGH (ref 78.0–100.0)
MYELOCYTES: 1 %
Metamyelocytes Relative: 1 %
Monocytes Absolute: 0.5 10*3/uL (ref 0.1–1.0)
Monocytes Relative: 3 % (ref 3–12)
NEUTROS ABS: 9.8 10*3/uL — AB (ref 1.7–7.7)
NEUTROS PCT: 54 % (ref 43–77)
Platelets: 186 10*3/uL (ref 150–400)
Promyelocytes Absolute: 0 %
RBC: 2 MIL/uL — AB (ref 4.22–5.81)
RDW: 33 % — ABNORMAL HIGH (ref 11.5–15.5)
WBC: 17.5 10*3/uL — ABNORMAL HIGH (ref 4.0–10.5)
nRBC: 48 /100 WBC — ABNORMAL HIGH

## 2013-02-10 LAB — PREPARE RBC (CROSSMATCH)

## 2013-02-10 MED ORDER — DEXTROSE-NACL 5-0.45 % IV SOLN
INTRAVENOUS | Status: DC
  Administered 2013-02-10: 08:00:00 via INTRAVENOUS

## 2013-02-10 MED ORDER — DIPHENHYDRAMINE HCL 50 MG/ML IJ SOLN
25.0000 mg | Freq: Once | INTRAMUSCULAR | Status: AC
  Administered 2013-02-10: 25 mg via INTRAVENOUS
  Filled 2013-02-10: qty 1

## 2013-02-10 MED ORDER — IOHEXOL 350 MG/ML SOLN
100.0000 mL | Freq: Once | INTRAVENOUS | Status: AC | PRN
  Administered 2013-02-10: 100 mL via INTRAVENOUS

## 2013-02-10 NOTE — H&P (Signed)
Hospital Admission Note Date: 02/10/2013  Patient name: [name] Medical record number: [id number] Date of birth: 06-18-1970 Age: 43 y.o. Gender: male PCP: MATTHEWS,MICHELLE A., MD  Attending physician: Altha HarmMichelle A Matthews, MD  Chief Complaint:: Feeling sick  History of Present Illness:Pt is a 43 y/o with Hb SS who presented for an acute office visit with c/o feeling poorly. He complains of fatigue, SOB and occasional dizziness. He states that the overwhelming symptom is fatigue and weakness. He also stated that he feels that his heart rate has been increasing with activity in the last week, and had palpitations one day last week. He denies calf pain or swelling but has had joint pain in his legs. He states that he has not noted any dark stools or any blood loss but notes that he was severely  jaundiced last week. He denies any cough, fevers, chills, N/V/D, chest pains  On checking of vital signs, he was found to be Hypoxic at 83%. He was placed in HOV for labs, hydration and possible transfusion. However labs revealed that patient is in Acute Renal Failure with  BUN of 34 and Cr of 1.57 which is increased from a baseline of BUN 10 and Cr 1.05. He is being admitted for AKI, dehydration, anemia and his pain will be treated during his hospitalization.  Scheduled Meds: . enoxaparin (LOVENOX) injection  40 mg Subcutaneous Q24H  . folic acid  1 mg Oral Daily  . HYDROmorphone PCA 0.3 mg/mL   Intravenous Q4H  . senna-docusate  1 tablet Oral BID   Continuous Infusions: . dextrose 5 % and 0.45% NaCl     PRN Meds:.diphenhydrAMINE, naloxone, ondansetron (ZOFRAN) IV, polyethylene glycol, sodium chloride Allergies: Review of patient's allergies indicates no known allergies. Past Medical History  Diagnosis Date  . CHF (congestive heart failure)   . Gouty arthropathy   . Chronic fatigue syndrome   . Sickle cell anemia   . Anxiety   . Depression    Past Surgical History  Procedure Laterality  Date  . Appendectomy    . Cholecystectomy    . Splenectomy     Family History  Problem Relation Age of Onset  . Sickle cell anemia    . Sickle cell trait Mother    History   Social History  . Marital Status: Single    Spouse Name: N/A    Number of Children: N/A  . Years of Education: N/A   Occupational History  . Not on file.   Social History Main Topics  . Smoking status: Never Smoker   . Smokeless tobacco: Never Used  . Alcohol Use: No  . Drug Use: No  . Sexual Activity: No   Other Topics Concern  . 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  Estranged from his biological father.   Review of Systems: A comprehensive review of systems was negative except as noted in the HPI Physical Exam: General: Alert, awake, oriented x3, in no acute distress.  HEENT: /AT PEERL, EOMI, mild icterus. OROPHARYNX: tachy after hydration with 750 ml of 0.9 NS , No exudate/ erythema/lesions.  Heart: Regular rate and rhythm, without murmurs, rubs, gallops.  Lungs: Clear to auscultation, no wheezing or rhonchi noted. No increased vocal fremitus resonant to percussion  Abdomen: Soft, nontender, nondistended, positive bowel sounds, no masses no hepatosplenomegaly noted.  Neuro: No focal neurological deficits noted cranial nerves II through XII grossly intact. DTRs 2+ bilaterally upper and lower extremities. Strength normal  in bilateral upper and lower extremities. Musculoskeletal: No warm swelling or erythema around joints, no spinal tenderness noted. Psychiatric: Patient alert and oriented x3, good insight and cognition, good recent to remote recall. Lymph node survey: No cervical axillary or inguinal lymphadenopathy noted.  Lab results:  Recent Labs  02/09/13 1030  NA 135*  K 5.0  CL 102  CO2 19  GLUCOSE 82  BUN 34*  CREATININE 1.57*  CALCIUM 8.6    Recent Labs  02/09/13 1030  AST 188*  ALT 58*  ALKPHOS 116  BILITOT 12.0*  PROT 7.7  ALBUMIN 3.1*   No  results found for this basename: LIPASE, AMYLASE,  in the last 72 hours  Recent Labs  02/09/13 1030  WBC 21.5*  NEUTROABS 13.8*  HGB 6.7*  HCT 18.3*  MCV 95.8  PLT 237   No results found for this basename: CKTOTAL, CKMB, CKMBINDEX, TROPONINI,  in the last 72 hours No components found with this basename: POCBNP,   Recent Labs  02/09/13 1030  DDIMER 3.88*   No results found for this basename: HGBA1C,  in the last 72 hours No results found for this basename: CHOL, HDL, LDLCALC, TRIG, CHOLHDL, LDLDIRECT,  in the last 72 hours No results found for this basename: TSH, T4TOTAL, FREET3, T3FREE, THYROIDAB,  in the last 72 hours  Recent Labs  02/09/13 1030  RETICCTPCT 23.9*   Imaging results:  Dg Chest 2 View  02/09/2013   CLINICAL DATA:  Sickle cell disease. Shortness of breath. Hypoxemia. Hypertension.  EXAM: CHEST  2 VIEW  COMPARISON:  02/1912  FINDINGS: Chronic cardiomegaly and pulmonary venous hypertension are stable in appearance. No evidence of acute or superimposed infiltrate. Mild right basilar pleural thickening seen. No evidence of pleural effusion.  IMPRESSION: Chronic cardiomegaly and pulmonary venous hypertension. No acute findings.   Electronically Signed   By: Myles Rosenthal M.D.   On: 02/09/2013 11:15     Assessment/Plan: 1. Acute Kidney Injury: I suspect that this is pre-renal given history and  BUN/Cr ratio. Will continue hydration with 0.9 NS for 2 L and then re-evaluate. Will re check labs this evening and determine further IVF.   2. Hyponatremia: Pt has a mild hyponatremia which I suspect will correct with hydration. Will re-check labs at 8:00pm.  3. SOB: Pt has an elevated D-dimer however he has no tachycardia or CP. He is however on Metoprolol which may be masking symptoms of tachycardia. Will order CT angiogram to r/o PE. Hold on full dose anticoagulation as patient is anemic and receiving blood transfusion.   4. Leukocytosis: Pt does not give a history consistent  with infection and he has no clinical  findings to support an infection. I suspect that this is a reflection of Sickle Cell Crisis and increased bone marrow turnover. Will continue to monitor. Check U/A.  5. Hb SS with crisis: Pt is certainly having an acute crisis however he would ordinarily manage this level of crisis in the day hospital and at home thus this would not ordinarily warrant an admission. However, his crisis will be treated with PCa as it would in the Day Hospital and he will continue on Methadone at HS. No NSAID's secondary to Acute kidney Injury.   MATTHEWS,MICHELLE A. 02/09/2013, 11:10 AM

## 2013-02-10 NOTE — Progress Notes (Signed)
SICKLE CELL SERVICE PROGRESS NOTE  [name] ZOX:[id number] DOB: 10/16/1970 DOA: 02/09/2013 PCP: Idaly Verret A., MD  Assessment/Plan: Active Problems:   Leukocytosis   Anemia   Hypoxia   Acute kidney injury (nontraumatic)   Dehydration   SOB (shortness of breath)  1. Acute Kidney Injury: BUN and Cr both improved with hydration. Continue hydration with hypotonic fluid. Anticipate that renal function will be back to normal by tomorrow. 2. Anemia: It appears that patient had a severe hemolytic episode. I suspect that his Hb was a hem-concentrated value at 6.5 and that his actual Hb was much lower which is now reflected in the low Hb with dilution. He is receiving another unit of PRBC. I do not anticipate need for further transfusion  During this hospitalization.  3. Dehydration: This has resolved with initial hydration and patient is drinking well. He should be able to maintain his hydration orally. 4. Hypoxemia: This could simply be a reflection of his low Oxygen carrying capacity associated with his low Hb. However the elevated D-dimer I cannot ignore the possibility of PE. I will proceed with a CT angiogram now that his renal function has improved. 5. Hb SS with pain: Pt has Utilized 21.13 mg and has had 53 demands and 41 deliveries since admission. He feels that his pain is coming under much better control since being re-hydrated and receiving blood. I anticipate that he will transition to oral analgesics by tomorrow.   Code Status: Full Code Family Communication: N/A Disposition Plan:  Disposition will be dependent on renal function and results of PE work-up.   [name] A.  Pager [phone]. If 7PM-7AM, please contact night-coverage.  02/10/2013, 11:54 AM  LOS: 1 day   Brief narrative: Pt is a 43 y/o with Hb SS who presented for an acute office visit with c/o feeling poorly. He complains of fatigue, SOB and occasional dizziness. He states that the overwhelming symptom is  fatigue and weakness. He also stated that he feels that his heart rate has been increasing with activity in the last week, and had palpitations one day last week. He denies calf pain or swelling but has had joint pain in his legs. He states that he has not noted any dark stools or any blood loss but notes that he was severely jaundiced last week. He denies any cough, fevers, chills, N/V/D, chest pains  On checking of vital signs, he was found to be Hypoxic at 83%. He was placed in HOV for labs, hydration and possible transfusion. However labs revealed that patient is in Acute Renal Failure with BUN of 34 and Cr of 1.57 which is increased from a baseline of BUN 10 and Cr 1.05. He is being admitted for AKI, dehydration, anemia and his pain will be treated during his hospitalization.   Consultants:  None  Procedures:  None  Antibiotics:  None  HPI/Subjective: Pt reports pain as 6/10 today. He feel better today.  Objective: Filed Vitals:   02/10/13 0944 02/10/13 1015 02/10/13 1112 02/10/13 1153  BP: 132/65 129/69 128/65   Pulse: 94 84 87   Temp: 98.3 F (36.8 C) 98.6 F (37 C) 98.3 F (36.8 C)   TempSrc: Oral Oral Oral   Resp: 20 16 18 20   Weight:      SpO2: 94% 94% 94% 95%   Weight change:   Intake/Output Summary (Last 24 hours) at 02/10/13 1154 Last data filed at 02/10/13 0944  Gross per 24 hour  Intake    740 ml  Output   2500 ml  Net  -1760 ml    General: Alert, awake, oriented x3, in no acute distress.  HEENT: [place]/AT PEERL, EOMI. Anicteric OROPHARYNX:  Moist, No exudate/ erythema/lesions.  Heart: Regular rate and rhythm, without murmurs, rubs, gallops.  Lungs: Clear to auscultation, no wheezing or rhonchi noted.  Abdomen: Soft, nontender, nondistended, positive bowel sounds, no masses no hepatosplenomegaly noted.  Neuro: No focal neurological deficits noted cranial nerves II through XII grossly intact. DTRs 2+ bilaterally upper and lower extremities. Strength normal  in bilateral upper and lower extremities. Musculoskeletal: No warm swelling or erythema around joints, no spinal tenderness noted. Psychiatric: Patient alert and oriented x3, good insight and cognition, good recent to remote recall.    Data Reviewed: Basic Metabolic Panel:  Recent Labs Lab 02/09/13 1030 02/10/13 0420  NA 135* 143  K 5.0 5.3  CL 102 111  CO2 19 19  GLUCOSE 82 98  BUN 34* 24*  CREATININE 1.57* 1.15  CALCIUM 8.6 8.2*   Liver Function Tests:  Recent Labs Lab 02/09/13 1030 02/10/13 0420  AST 188* 164*  ALT 58* 54*  ALKPHOS 116 97  BILITOT 12.0* 6.6*  PROT 7.7 6.8  ALBUMIN 3.1* 2.6*   No results found for this basename: LIPASE, AMYLASE,  in the last 168 hours No results found for this basename: AMMONIA,  in the last 168 hours CBC:  Recent Labs Lab 02/09/13 1030 02/10/13 0420  WBC 21.5* 17.5*  NEUTROABS 13.8* 9.8*  HGB 6.7* 6.9*  HCT 18.3* 20.2*  MCV 95.8 101.0*  PLT 237 186   Cardiac Enzymes: No results found for this basename: CKTOTAL, CKMB, CKMBINDEX, TROPONINI,  in the last 168 hours BNP (last 3 results)  Recent Labs  02/16/12 1511 02/22/12 0345  PROBNP 742.6* 1674.0*   CBG: No results found for this basename: GLUCAP,  in the last 168 hours  Recent Results (from the past 240 hour(s))  MRSA PCR SCREENING     Status: None   Collection Time    02/09/13  7:44 PM      Result Value Range Status   MRSA by PCR NEGATIVE  NEGATIVE Final   Comment:            The GeneXpert MRSA Assay (FDA     approved for NASAL specimens     only), is one component of a     comprehensive MRSA colonization     surveillance program. It is not     intended to diagnose MRSA     infection nor to guide or     monitor treatment for     MRSA infections.     Studies: Dg Chest 2 View  02/09/2013   CLINICAL DATA:  Sickle cell disease. Shortness of breath. Hypoxemia. Hypertension.  EXAM: CHEST  2 VIEW  COMPARISON:  02/1912  FINDINGS: Chronic cardiomegaly and  pulmonary venous hypertension are stable in appearance. No evidence of acute or superimposed infiltrate. Mild right basilar pleural thickening seen. No evidence of pleural effusion.  IMPRESSION: Chronic cardiomegaly and pulmonary venous hypertension. No acute findings.   Electronically Signed   By: Myles Rosenthal M.D.   On: 02/09/2013 11:15    Scheduled Meds: . enoxaparin (LOVENOX) injection  40 mg Subcutaneous Q24H  . folic acid  1 mg Oral Daily  . HYDROmorphone PCA 0.3 mg/mL   Intravenous Q4H  . senna-docusate  1 tablet Oral BID   Continuous Infusions: . dextrose 5 % and 0.45% NaCl 100 mL/hr at  02/10/13 0824    Total time spent was 40 minutes.

## 2013-02-10 NOTE — Care Management Note (Signed)
   CARE MANAGEMENT NOTE 02/10/2013  Patient:  [name],[place]   Account Number:  [id number]  Date Initiated:  02/10/2013  Documentation initiated by:  Harlea Goetzinger  Subjective/Objective Assessment:   43 yo male admitted with acute kidney injury, hyponatremia and anemia. PCP: Dr.Matthews     Action/Plan:   Home when stable   Anticipated DC Date:     Anticipated DC Plan:  HOME/SELF CARE  In-house referral  NA      DC Planning Services  NA      PAC Choice  NA   Choice offered to / List presented to:  NA   DME arranged  NA      DME agency  NA     HH arranged  NA      HH agency  NA   Status of service:  Completed, signed off Medicare Important Message given?   (If response is "NO", the following Medicare IM given date fields will be blank) Date Medicare IM given:   Date Additional Medicare IM given:    Discharge Disposition:    Per UR Regulation:  Reviewed for med. necessity/level of care/duration of stay  If discussed at Long Length of Stay Meetings, dates discussed:    Comments:  02/10/13 1152 Rashaan Wyles,MSN,RN [phone] Chart reviewed for utilization of services. Identified PCP and pharmacy. PTA pt independent.

## 2013-02-11 DIAGNOSIS — E875 Hyperkalemia: Secondary | ICD-10-CM

## 2013-02-11 LAB — TYPE AND SCREEN
ABO/RH(D): A POS
ANTIBODY SCREEN: NEGATIVE
Unit division: 0
Unit division: 0

## 2013-02-11 LAB — COMPREHENSIVE METABOLIC PANEL
ALBUMIN: 2.6 g/dL — AB (ref 3.5–5.2)
ALK PHOS: 105 U/L (ref 39–117)
ALT: 48 U/L (ref 0–53)
AST: 124 U/L — ABNORMAL HIGH (ref 0–37)
BILIRUBIN TOTAL: 5.6 mg/dL — AB (ref 0.3–1.2)
BUN: 15 mg/dL (ref 6–23)
CHLORIDE: 111 meq/L (ref 96–112)
CO2: 23 meq/L (ref 19–32)
Calcium: 8.5 mg/dL (ref 8.4–10.5)
Creatinine, Ser: 0.96 mg/dL (ref 0.50–1.35)
GFR calc Af Amer: >90 mL/min (ref >90)
GFR calc non Af Amer: >90 mL/min (ref >90)
Glucose, Bld: 108 mg/dL — ABNORMAL HIGH (ref 70–99)
POTASSIUM: 5.4 meq/L — AB (ref 3.7–5.3)
Sodium: 141 mEq/L (ref 137–147)
Total Protein: 6.9 g/dL (ref 6.0–8.3)

## 2013-02-11 LAB — BASIC METABOLIC PANEL
BUN: 17 mg/dL (ref 6–23)
CALCIUM: 8.3 mg/dL — AB (ref 8.4–10.5)
CHLORIDE: 109 meq/L (ref 96–112)
CO2: 22 meq/L (ref 19–32)
CREATININE: 0.99 mg/dL (ref 0.50–1.35)
GFR calc Af Amer: >90 mL/min (ref >90)
GFR calc non Af Amer: >90 mL/min (ref >90)
GLUCOSE: 90 mg/dL (ref 70–99)
Potassium: 5.5 mEq/L — ABNORMAL HIGH (ref 3.7–5.3)
Sodium: 142 mEq/L (ref 137–147)

## 2013-02-11 LAB — CBC WITH DIFFERENTIAL/PLATELET
Band Neutrophils: 0 % (ref 0–10)
Basophils Absolute: 0.1 10*3/uL (ref 0.0–0.1)
Basophils Relative: 1 % (ref 0–1)
Blasts: 0 %
Eosinophils Absolute: 0.3 10*3/uL (ref 0.0–0.7)
Eosinophils Relative: 2 % (ref 0–5)
HCT: 23.5 % — ABNORMAL LOW (ref 39.0–52.0)
Hemoglobin: 8.1 g/dL — ABNORMAL LOW (ref 13.0–17.0)
Lymphocytes Relative: 29 % (ref 12–46)
Lymphs Abs: 3.9 10*3/uL (ref 0.7–4.0)
MCH: 34.9 pg — ABNORMAL HIGH (ref 26.0–34.0)
MCHC: 34.5 g/dL (ref 30.0–36.0)
MCV: 101.3 fL — AB (ref 78.0–100.0)
METAMYELOCYTES PCT: 0 %
MYELOCYTES: 0 %
Monocytes Absolute: 0.7 10*3/uL (ref 0.1–1.0)
Monocytes Relative: 5 % (ref 3–12)
Neutro Abs: 8.3 10*3/uL — ABNORMAL HIGH (ref 1.7–7.7)
Neutrophils Relative %: 63 % (ref 43–77)
Platelets: 199 10*3/uL (ref 150–400)
Promyelocytes Absolute: 0 %
RBC: 2.32 MIL/uL — ABNORMAL LOW (ref 4.22–5.81)
RDW: 29 % — AB (ref 11.5–15.5)
WBC: 13.3 10*3/uL — ABNORMAL HIGH (ref 4.0–10.5)
nRBC: 44 /100 WBC — ABNORMAL HIGH

## 2013-02-11 MED ORDER — SODIUM POLYSTYRENE SULFONATE 15 GM/60ML PO SUSP
15.0000 g | Freq: Once | ORAL | Status: AC
  Administered 2013-02-11: 15 g via ORAL
  Filled 2013-02-11: qty 60

## 2013-02-11 MED ORDER — HYDROMORPHONE HCL PF 1 MG/ML IJ SOLN
1.0000 mg | INTRAMUSCULAR | Status: DC | PRN
  Administered 2013-02-11 – 2013-02-13 (×8): 1 mg via INTRAVENOUS
  Filled 2013-02-11 (×7): qty 1

## 2013-02-11 MED ORDER — FUROSEMIDE 10 MG/ML IJ SOLN
20.0000 mg | Freq: Once | INTRAMUSCULAR | Status: AC
  Administered 2013-02-11: 20 mg via INTRAMUSCULAR
  Filled 2013-02-11: qty 2

## 2013-02-11 MED ORDER — HYDROMORPHONE HCL PF 1 MG/ML IJ SOLN
INTRAMUSCULAR | Status: AC
  Administered 2013-02-11: 1 mg via INTRAVENOUS
  Filled 2013-02-11: qty 1

## 2013-02-11 MED ORDER — HYDROMORPHONE HCL 4 MG PO TABS
4.0000 mg | ORAL_TABLET | ORAL | Status: DC
Start: 2013-02-11 — End: 2013-02-13
  Administered 2013-02-11 – 2013-02-13 (×13): 4 mg via ORAL
  Filled 2013-02-11 (×13): qty 1

## 2013-02-11 MED ORDER — HYDROMORPHONE HCL PF 1 MG/ML IJ SOLN
1.0000 mg | Freq: Once | INTRAMUSCULAR | Status: AC
  Administered 2013-02-11: 1 mg via INTRAVENOUS

## 2013-02-11 NOTE — Progress Notes (Signed)
SICKLE CELL SERVICE PROGRESS NOTE  [name] VQQ:[id number] DOB: Nov 15, 1970 DOA: 02/09/2013 PCP: MATTHEWS,MICHELLE A., MD  Assessment/Plan: Active Problems:   Leukocytosis   Anemia   Hypoxia   Acute kidney injury (nontraumatic)   Dehydration   SOB (shortness of breath)  1. Acute Kidney Injury: BUN and Cr have continued to improve even after hydration discontinued.  However potassium mildly elevated. Will recheck labs at 2:00pm. If still elevated will give Kayexalate.  2. Anemia: Hb 8.1 post-transfusion and symptoms of fatigue, weakness and dizziness resolved. 3. Hypoxemia: Pt will having an Oxygen requirement. Likely secondary to mild fluid overload secondary to fluid resuscitation. Will give a small dose of lasix and re-evaluate. If still having hypoxemia will consider obtaining CXR. CT angiogram was negative for a PE. 4. Dehydration: Resolved. Hb SS with pain: Pt has Utilized 14.64 mg and has had 26 demands and 24 deliveries in the last 24 hours. He feels that his pain is coming under much better control since being re-hydrated and receiving blood. I will transition to oral analgesics with IV for breakthrough pain. He was given his prescriptions by Clinic staff. Code Status: Full Code Family Communication: N/A Disposition Plan:  Anticipate discharge home tomorrow as long as potassium WNL and no worsening of renal function.   MATTHEWS,MICHELLE A.  Pager [phone]. If 7PM-7AM, please contact night-coverage.  02/11/2013, 4:52 PM  LOS: 2 days   Brief narrative: Pt is a 43 y/o with Hb SS who presented for an acute office visit with c/o feeling poorly. He complains of fatigue, SOB and occasional dizziness. He states that the overwhelming symptom is fatigue and weakness. He also stated that he feels that his heart rate has been increasing with activity in the last week, and had palpitations one day last week. He denies calf pain or swelling but has had joint pain in his legs. He states that  he has not noted any dark stools or any blood loss but notes that he was severely jaundiced last week. He denies any cough, fevers, chills, N/V/D, chest pains  On checking of vital signs, he was found to be Hypoxic at 83%. He was placed in HOV for labs, hydration and possible transfusion. However labs revealed that patient is in Acute Renal Failure with BUN of 34 and Cr of 1.57 which is increased from a baseline of BUN 10 and Cr 1.05. He is being admitted for AKI, dehydration, anemia and his pain will be treated during his hospitalization.   Consultants:  None  Procedures:  None  Antibiotics:  None  HPI/Subjective: Pt reports pain as 5/10 today. He feels better today.  Objective: Filed Vitals:   02/11/13 0635 02/11/13 1000 02/11/13 1431 02/11/13 1514  BP: 118/66 115/55 121/50   Pulse: 89 84 85   Temp: 98.5 F (36.9 C) 98.3 F (36.8 C) 98.4 F (36.9 C)   TempSrc: Oral Oral Oral   Resp: 20 16 16 16   Height:      Weight:      SpO2: 95% 91% 94% 94%   Weight change:   Intake/Output Summary (Last 24 hours) at 02/11/13 1652 Last data filed at 02/11/13 1432  Gross per 24 hour  Intake    855 ml  Output   3625 ml  Net  -2770 ml    General: Alert, awake, oriented x3, in no acute distress.  HEENT: [place]/AT PEERL, EOMI. Anicteric OROPHARYNX:  Moist, No exudate/ erythema/lesions.  Heart: Regular rate and rhythm, without murmurs, rubs, gallops.  Lungs:  Clear to auscultation, no wheezing or rhonchi noted.  Abdomen: Soft, nontender, nondistended, positive bowel sounds, no masses no hepatosplenomegaly noted.  Neuro: No focal neurological deficits noted cranial nerves II through XII grossly intact. DTRs 2+ bilaterally upper and lower extremities. Strength normal in bilateral upper and lower extremities. Musculoskeletal: No warm swelling or erythema around joints, no spinal tenderness noted. Psychiatric: Patient alert and oriented x3, good insight and cognition, good recent to remote  recall.    Data Reviewed: Basic Metabolic Panel:  Recent Labs Lab 02/09/13 1030 02/10/13 0420 02/11/13 0945 02/11/13 1452  NA 135* 143 141 142  K 5.0 5.3 5.4* 5.5*  CL 102 111 111 109  CO2 19 19 23 22   GLUCOSE 82 98 108* 90  BUN 34* 24* 15 17  CREATININE 1.57* 1.15 0.96 0.99  CALCIUM 8.6 8.2* 8.5 8.3*   Liver Function Tests:  Recent Labs Lab 02/09/13 1030 02/10/13 0420 02/11/13 0945  AST 188* 164* 124*  ALT 58* 54* 48  ALKPHOS 116 97 105  BILITOT 12.0* 6.6* 5.6*  PROT 7.7 6.8 6.9  ALBUMIN 3.1* 2.6* 2.6*   No results found for this basename: LIPASE, AMYLASE,  in the last 168 hours No results found for this basename: AMMONIA,  in the last 168 hours CBC:  Recent Labs Lab 02/09/13 1030 02/10/13 0420 02/11/13 0945  WBC 21.5* 17.5* 13.3*  NEUTROABS 13.8* 9.8* 8.3*  HGB 6.7* 6.9* 8.1*  HCT 18.3* 20.2* 23.5*  MCV 95.8 101.0* 101.3*  PLT 237 186 199   Cardiac Enzymes: No results found for this basename: CKTOTAL, CKMB, CKMBINDEX, TROPONINI,  in the last 168 hours BNP (last 3 results)  Recent Labs  02/16/12 1511 02/22/12 0345  PROBNP 742.6* 1674.0*   CBG: No results found for this basename: GLUCAP,  in the last 168 hours  Recent Results (from the past 240 hour(s))  MRSA PCR SCREENING     Status: None   Collection Time    02/09/13  7:44 PM      Result Value Range Status   MRSA by PCR NEGATIVE  NEGATIVE Final   Comment:            The GeneXpert MRSA Assay (FDA     approved for NASAL specimens     only), is one component of a     comprehensive MRSA colonization     surveillance program. It is not     intended to diagnose MRSA     infection nor to guide or     monitor treatment for     MRSA infections.     Studies: Dg Chest 2 View  02/09/2013   CLINICAL DATA:  Sickle cell disease. Shortness of breath. Hypoxemia. Hypertension.  EXAM: CHEST  2 VIEW  COMPARISON:  02/1912  FINDINGS: Chronic cardiomegaly and pulmonary venous hypertension are stable in  appearance. No evidence of acute or superimposed infiltrate. Mild right basilar pleural thickening seen. No evidence of pleural effusion.  IMPRESSION: Chronic cardiomegaly and pulmonary venous hypertension. No acute findings.   Electronically Signed   By: Myles RosenthalJohn  Stahl M.D.   On: 02/09/2013 11:15    Scheduled Meds: . enoxaparin (LOVENOX) injection  40 mg Subcutaneous Q24H  . folic acid  1 mg Oral Daily  . HYDROmorphone  4 mg Oral Q4H  . senna-docusate  1 tablet Oral BID  . sodium polystyrene  15 g Oral Once   Continuous Infusions:    Total time spent was 40 minutes.

## 2013-02-11 NOTE — Progress Notes (Signed)
Per MD request, Prescriptions taken to patient room 1303 and given to patient.

## 2013-02-12 LAB — CBC WITH DIFFERENTIAL/PLATELET
BASOS ABS: 0 10*3/uL (ref 0.0–0.1)
Basophils Relative: 0 % (ref 0–1)
EOS ABS: 0.9 10*3/uL — AB (ref 0.0–0.7)
Eosinophils Relative: 7 % — ABNORMAL HIGH (ref 0–5)
HEMATOCRIT: 23.2 % — AB (ref 39.0–52.0)
Hemoglobin: 7.9 g/dL — ABNORMAL LOW (ref 13.0–17.0)
LYMPHS PCT: 34 % (ref 12–46)
Lymphs Abs: 4.6 10*3/uL — ABNORMAL HIGH (ref 0.7–4.0)
MCH: 34.6 pg — ABNORMAL HIGH (ref 26.0–34.0)
MCHC: 34.1 g/dL (ref 30.0–36.0)
MCV: 101.8 fL — ABNORMAL HIGH (ref 78.0–100.0)
MONO ABS: 0.7 10*3/uL (ref 0.1–1.0)
Monocytes Relative: 5 % (ref 3–12)
NEUTROS PCT: 54 % (ref 43–77)
NRBC: 17 /100{WBCs} — AB
Neutro Abs: 7.2 10*3/uL (ref 1.7–7.7)
PLATELETS: 191 10*3/uL (ref 150–400)
RBC: 2.28 MIL/uL — ABNORMAL LOW (ref 4.22–5.81)
RDW: 26.2 % — ABNORMAL HIGH (ref 11.5–15.5)
WBC: 13.4 10*3/uL — AB (ref 4.0–10.5)

## 2013-02-12 LAB — COMPREHENSIVE METABOLIC PANEL
ALK PHOS: 105 U/L (ref 39–117)
ALT: 43 U/L (ref 0–53)
AST: 112 U/L — ABNORMAL HIGH (ref 0–37)
Albumin: 2.8 g/dL — ABNORMAL LOW (ref 3.5–5.2)
BUN: 17 mg/dL (ref 6–23)
CO2: 21 meq/L (ref 19–32)
Calcium: 8.2 mg/dL — ABNORMAL LOW (ref 8.4–10.5)
Chloride: 106 mEq/L (ref 96–112)
Creatinine, Ser: 0.91 mg/dL (ref 0.50–1.35)
GFR calc Af Amer: >90 mL/min (ref >90)
Glucose, Bld: 93 mg/dL (ref 70–99)
Potassium: 5.3 mEq/L (ref 3.7–5.3)
Sodium: 138 mEq/L (ref 137–147)
TOTAL PROTEIN: 7.1 g/dL (ref 6.0–8.3)
Total Bilirubin: 6.1 mg/dL — ABNORMAL HIGH (ref 0.3–1.2)

## 2013-02-12 MED ORDER — IBUPROFEN 800 MG PO TABS
800.0000 mg | ORAL_TABLET | Freq: Once | ORAL | Status: AC
  Administered 2013-02-12: 800 mg via ORAL
  Filled 2013-02-12: qty 1

## 2013-02-12 MED ORDER — SODIUM POLYSTYRENE SULFONATE 15 GM/60ML PO SUSP
15.0000 g | Freq: Once | ORAL | Status: AC
  Administered 2013-02-12: 15 g via ORAL
  Filled 2013-02-12: qty 60

## 2013-02-12 NOTE — Plan of Care (Addendum)
Problem: Phase I Progression Outcomes Goal: Pain controlled with appropriate interventions Outcome: Progressing Pain remains a 6/10 after breakthrough IV dilaudid. Paged NP on call. Order received for 1 additional dose of dilaudid now.

## 2013-02-12 NOTE — Plan of Care (Signed)
Problem: Phase III Progression Outcomes Goal: IV fluids wean to po Outcome: Progressing kvo fluids for meds

## 2013-02-12 NOTE — Progress Notes (Signed)
Elray McgregorMary Lynch NP returned page. No new orders.

## 2013-02-12 NOTE — Progress Notes (Signed)
Paged NP about K+ 5.3 this am.

## 2013-02-12 NOTE — Progress Notes (Signed)
Patient reports increase in pain and a headache. Requesting advil. Paged NP orders received.

## 2013-02-12 NOTE — Progress Notes (Signed)
[facility]GRESS [facility]  Alanda Amasslliott Neff WUJ:[id number] DOB: 23-Nov-1970 DOA: 02/09/2013 PCP: MATTHEWS,MICHELLE A., MD  Assessment/Plan: Active Problems:   Leukocytosis   Hypoxia   Anemia   Acute kidney injury (nontraumatic)   Dehydration   SOB (shortness of breath)  1. Acute Kidney Injury: BUN and Cr have continued to improve. AKI likely due to dehydration discontinued.  However potassium mildly elevated. Today potassium is 5.3. Will give another dose of Kayexalate and recheck in the AM.  2. Anemia: Hb 8.1 post-transfusion and symptoms of fatigue, weakness and dizziness resolved. Hgb stable at 7.9 today. 3. Hypoxemia: Resolved. CT neg for PE. Pt with good oxygenation overnight. Averaging 96% on RA.  4. Dehydration: Resolved. Hb SS with pain:  He feels that his pain is coming under much better control since being re-hydrated and receiving blood. He was ready for discharge this morning but began to have increasing back pain in the afternoon. He is on oral analgesics with IV for breakthrough pain. Will monitor for another day.  He was given his prescriptions by Clinic staff. Code Status: Full Code Family Communication: N/A Disposition Plan:  Anticipate discharge home tomorrow as long as potassium WNL and no worsening of renal function.   Serina CowperAGBOOLA, Jeslynn Hollander  Pager [phone]. If 7PM-7AM, please contact night-coverage.  02/12/2013, 3:57 PM  LOS: 3 days   Brief narrative: Pt is a 43 y/o with Hb SS who presented for an acute office visit with c/o feeling poorly. He complains of fatigue, SOB and occasional dizziness. He states that the overwhelming symptom is fatigue and weakness. He also stated that he feels that his heart rate has been increasing with activity in the last week, and had palpitations one day last week. He denies calf pain or swelling but has had joint pain in his legs. He states that he has not noted any dark stools or any blood loss but notes that he was severely jaundiced  last week. He denies any cough, fevers, chills, N/V/D, chest pains  On checking of vital signs, he was found to be Hypoxic at 83%. He was placed in HOV for labs, hydration and possible transfusion. However labs revealed that patient is in Acute Renal Failure with BUN of 34 and Cr of 1.57 which is increased from a baseline of BUN 10 and Cr 1.05. He is being admitted for AKI, dehydration, anemia and his pain will be treated during his hospitalization.   Consultants:  None  Procedures:  None  Antibiotics:  None  HPI/Subjective: Pt reports pain as 5/10 today. He feels better today.  Objective: Filed Vitals:   02/12/13 0215 02/12/13 0709 02/12/13 1000 02/12/13 1425  BP: 120/56 115/53 107/44 117/57  Pulse: 85 83 79 85  Temp: 98.7 F (37.1 C) 98.4 F (36.9 C) 97.6 F (36.4 C) 98.3 F (36.8 C)  TempSrc: Oral Oral Oral Oral  Resp: 20 20 18 16   Height:      Weight:      SpO2: 96% 96% 96% 96%   Weight change:   Intake/Output Summary (Last 24 hours) at 02/12/13 1557 Last data filed at 02/12/13 1230  Gross per 24 hour  Intake   1814 ml  Output   2500 ml  Net   -686 ml    General: Alert, awake, oriented x3, in no acute distress.  HEENT: [place]/AT PEERL, EOMI. Anicteric OROPHARYNX:  Moist, No exudate/ erythema/lesions.  Heart: Regular rate and rhythm, without murmurs, rubs, gallops.  Lungs: Clear to auscultation, no wheezing or  rhonchi noted.  Abdomen: Soft, nontender, nondistended, positive bowel sounds, no masses no hepatosplenomegaly noted.  Neuro: No focal neurological deficits noted cranial nerves II through XII grossly intact. \\ Musculoskeletal: No warm swelling or erythema around joints, no spinal tenderness noted. Psychiatric: Patient alert and oriented x3, good insight and cognition, good recent to remote recall.    Data Reviewed: Basic Metabolic Panel:  Recent Labs Lab 02/09/13 1030 02/10/13 0420 02/11/13 0945 02/11/13 1452 02/12/13 0355  NA 135* 143 141 142  138  K 5.0 5.3 5.4* 5.5* 5.3  CL 102 111 111 109 106  CO2 19 19 23 22 21   GLUCOSE 82 98 108* 90 93  BUN 34* 24* 15 17 17   CREATININE 1.57* 1.15 0.96 0.99 0.91  CALCIUM 8.6 8.2* 8.5 8.3* 8.2*   Liver Function Tests:  Recent Labs Lab 02/09/13 1030 02/10/13 0420 02/11/13 0945 02/12/13 0355  AST 188* 164* 124* 112*  ALT 58* 54* 48 43  ALKPHOS 116 97 105 105  BILITOT 12.0* 6.6* 5.6* 6.1*  PROT 7.7 6.8 6.9 7.1  ALBUMIN 3.1* 2.6* 2.6* 2.8*   No results found for this basename: LIPASE, AMYLASE,  in the last 168 hours No results found for this basename: AMMONIA,  in the last 168 hours CBC:  Recent Labs Lab 02/09/13 1030 02/10/13 0420 02/11/13 0945 02/12/13 0355  WBC 21.5* 17.5* 13.3* 13.4*  NEUTROABS 13.8* 9.8* 8.3* 7.2  HGB 6.7* 6.9* 8.1* 7.9*  HCT 18.3* 20.2* 23.5* 23.2*  MCV 95.8 101.0* 101.3* 101.8*  PLT 237 186 199 191     Recent Results (from the past 240 hour(s))  MRSA PCR SCREENING     Status: None   Collection Time    02/09/13  7:44 PM      Result Value Range Status   MRSA by PCR NEGATIVE  NEGATIVE Final   Comment:            The GeneXpert MRSA Assay (FDA     approved for NASAL specimens     only), is one component of a     comprehensive MRSA colonization     surveillance program. It is not     intended to diagnose MRSA     infection nor to guide or     monitor treatment for     MRSA infections.     Studies: Dg Chest 2 View  02/09/2013   CLINICAL DATA:  Sickle cell disease. Shortness of breath. Hypoxemia. Hypertension.  EXAM: CHEST  2 VIEW  COMPARISON:  02/1912  FINDINGS: Chronic cardiomegaly and pulmonary venous hypertension are stable in appearance. No evidence of acute or superimposed infiltrate. Mild right basilar pleural thickening seen. No evidence of pleural effusion.  IMPRESSION: Chronic cardiomegaly and pulmonary venous hypertension. No acute findings.   Electronically Signed   By: Myles Rosenthal M.D.   On: 02/09/2013 11:15    Scheduled Meds: .  enoxaparin (LOVENOX) injection  40 mg Subcutaneous Q24H  . folic acid  1 mg Oral Daily  . HYDROmorphone  4 mg Oral Q4H  . senna-docusate  1 tablet Oral BID   Continuous Infusions:    Total time spent was 40 minutes.    Care discussed with pt and he is in agreement. Serina Cowper, M.D.

## 2013-02-12 NOTE — Discharge Instructions (Signed)
Hyperkalemia  Hyperkalemia is when you have too much potassium in your blood. This can be a life-threatening condition. Potassium is normally removed (excreted) from the body by the kidneys.  CAUSES   The potassium level in your body can become too high for the following reasons:   You take in too much potassium. You can do this by:   Using salt substitutes. They contain large amounts of potassium.   Taking potassium supplements from your caregiver. The dose may be too high for you.   Eating foods or taking nutritional products with potassium.   You excrete too little potassium. This can happen if:   Your kidneys are not functioning properly. Kidney (renal) disease is a very common cause of hyperkalemia.   You are taking medicines that lower your excretion of potassium, such as certain diuretic medicines.   You have an adrenal gland disease called Addison's disease.   You have a urinary tract obstruction, such as kidney stones.   You are on treatment to mechanically clean your blood (dialysis) and you skip a treatment.   You release a high amount of potassium from your cells into your blood. You may have a condition that causes potassium to move from your cells to your bloodstream. This can happen with:   Injury to muscles or other tissues. Most potassium is stored in the muscles.   Severe burns or infections.   Acidic blood plasma (acidosis). Acidosis can result from many diseases, such as uncontrolled diabetes.  SYMPTOMS   Usually, there are no symptoms unless the potassium is dangerously high or has risen very quickly. Symptoms may include:   Irregular or very slow heartbeat.   Feeling sick to your stomach (nauseous).   Tiredness (fatigue).   Nerve problems such as tingling of the skin, numbness of the hands or feet, weakness, or paralysis.  DIAGNOSIS   A simple blood test can measure the amount of potassium in your body. An electrocardiogram test of the heart can also help make the diagnosis.  The heart may beat dangerously fast or slow down and stop beating with severe hyperkalemia.   TREATMENT   Treatment depends on how bad the condition is and on the underlying cause.   If the hyperkalemia is an emergency (causing heart problems or paralysis), many different medicines can be used alone or together to lower the potassium level briefly. This may include an insulin injection even if you are not diabetic. Emergency dialysis may be needed to remove potassium from the body.   If the hyperkalemia is less severe or dangerous, the underlying cause is treated. This can include taking medicines if needed. Your prescription medicines may be changed. You may also need to take a medicine to help your body get rid of potassium. You may need to eat a diet low in potassium.  HOME CARE INSTRUCTIONS    Take medicines and supplements as directed by your caregiver.   Do not take any over-the-counter medicines, supplements, natural products, herbs, or vitamins without reviewing them with your caregiver. Certain supplements and natural food products can have high amounts of potassium. Other products (such as ibuprofen) can damage weak kidneys and raise your potassium.   You may be asked to do repeat lab tests. Be sure to follow these directions.   If you have kidney disease, you may need to follow a low potassium diet.  SEEK MEDICAL CARE IF:    You notice an irregular or very slow heartbeat.   You feel lightheaded.     these instructions. °· Will watch your condition. °· Will get help right away if you are not doing well or get worse. °Document Released: 01/10/2002 Document Revised: 04/14/2011 Document Reviewed: 06/27/2010 °ExitCare® Patient Information ©2014 [employer]. ° °Kidney Failure °Kidney failure happens when  the kidneys cannot remove waste and excess fluid that naturally builds up in your blood after your body breaks down food. This leads to a dangerous buildup of waste products and fluid in the blood. °HOME CARE °· Follow your diet as told by your doctor. °· Take all medicines as told by your doctor. °· Keep all of your dialysis appointments. Call if you are unable to keep an appointment. °GET HELP RIGHT AWAY IF:  °· You make a lot more or very little pee (urine). °· Your face or ankles puff up (swell). °· You develop shortness of breath. °· You develop weakness, feel tired, or you do not feel hungry (appetite loss). °· You feel poorly for no known reason. °MAKE SURE YOU:  °· Understand these instructions. °· Will watch your condition. °· Will get help right away if you are not doing well or get worse. °Document Released: 04/16/2009 Document Revised: 04/14/2011 Document Reviewed: 05/23/2009 °[employer]® Patient Information ©2014 [employer]. ° °

## 2013-02-12 NOTE — Progress Notes (Signed)
Patient reported to this nurse to stay another night because of his back pain,Dr. Nathanial MillmanAgboola notified.- [name] Marinonna Abigale Dorow RN

## 2013-02-12 NOTE — Discharge Summary (Signed)
[name] MRN: [id number] DOB/AGE: [birth year] 43 y.o.  Admit date: 02/09/2013 Discharge date: 02/13/2013  Primary Care Physician:  MATTHEWS,MICHELLE A., MD   Discharge Diagnoses:   Patient Active Problem List   Diagnosis Date Noted  . Dehydration 02/10/2013  . SOB (shortness of breath) 02/10/2013  . Acute kidney injury (nontraumatic) 02/09/2013  . Sinus tachycardia by electrocardiogram 10/19/2012  . 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 Medication management 07/05/2012  . Prolonged Q-T interval on ECG 06/22/2012  . 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 Abnormality of gait 06/21/2012  . Drooling 06/21/2012  . Generalized anxiety disorder 06/21/2012  . Social isolation 06/21/2012  . Acute renal failure 02/18/2012  . Right knee pain 02/17/2012  . Acute respiratory distress 02/16/2012  . HCAP (healthcare-associated pneumonia) 02/16/2012  . Hypoxia 02/16/2012  . SVT (supraventricular tachycardia) 02/16/2012  . Anemia 02/16/2012  . Jaundice 02/16/2012  . Sickle cell crisis 12/07/2011  . 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 Chronic pain 03/24/2011  . Hypokalemia 03/24/2011  . Leukocytosis 03/24/2011  . Acute chest syndrome(517.3) 03/24/2011  . 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 Gout 03/23/2011    DISCHARGE MEDICATION:   Medication List         allopurinol 300 MG tablet  Commonly known as:  ZYLOPRIM  Take 300 mg by mouth daily.     ALPRAZolam 0.25 MG tablet  Commonly known as:  XANAX  Take 1 tablet (0.25 mg total) by mouth 3 (three) times daily as needed for anxiety.     folic acid 1 MG tablet  Commonly known as:  FOLVITE  Take 1 tablet (1 mg total) by mouth daily.     HYDROmorphone 4 MG tablet  Commonly known as:  DILAUDID  Take 4 mg by mouth every 4 (four) hours as needed for severe pain.     hydroxyurea 500 MG capsule  Commonly known as:  HYDREA  Take 1,000 mg by mouth daily. May take with food to minimize GI side effects.     ibuprofen 800 MG tablet   Commonly known as:  ADVIL,MOTRIN  Take 1 tablet (800 mg total) by mouth every 8 (eight) hours as needed for pain.     methadone 10 MG tablet  Commonly known as:  DOLOPHINE  Take 1 tablet (10 mg total) by mouth at bedtime.     metoprolol 50 MG tablet  Commonly known as:  LOPRESSOR  Take 1 tablet (50 mg total) by mouth 2 (two) times daily.     zolpidem 10 MG tablet  Commonly known as:  AMBIEN  take 1 tablet by mouth at bedtime if needed for sleep          Consults:  None  SIGNIFICANT DIAGNOSTIC STUDIES:    BRIEF ADMITTING H & P: Pt is a 43 y/o with Hb SS who presented for an acute office visit with c/o feeling poorly. He complains of fatigue, SOB and occasional dizziness. He states that the overwhelming symptom is fatigue and weakness. He also stated that he feels that his heart rate has been increasing with activity in the last week, and had palpitations one day last week. He denies calf pain or swelling but has had joint pain in his legs. He states that he has not noted any dark stools or any blood loss but notes that he was severely jaundiced last week. He denies any cough, fevers, chills, N/V/D, chest pains  On checking of vital signs, he was found to be Hypoxic at 83%. He was  placed in HOV for labs, hydration and possible transfusion. However labs revealed that patient is in Acute Renal Failure with BUN of 34 and Cr of 1.57 which is increased from a baseline of BUN 10 and Cr 1.05. He is being admitted for AKI, dehydration, anemia and his pain will be treated during his hospitalization.    Hospital Course by problem:   Acute Kidney Injury: BUN and Cr were elevated on admission. Thought to be prerenal due to dehydration. Both improved with hydration. Renal function normalized to 0.91 (baseline 0.9). He also had hyperkalemia that was treated with several doses of Kayexalate. His potassium elevation is often seen with acute crisis and remained between 5-5.6 during stay. He should  f/u with his PCP within one week to have his potassium and renal function rechecked.  Anemia: It appears that patient had a severe hemolytic episode. His Hb on admission was 6.7 and was likely heme-concentrated. He received 2 units of PRBC. Hgb improved to 8.1 and remained stable afterwards.  Dehydration: Resolved with initial IV hydration and patient was drinking well. He should be able to maintain his hydration orally.   Hypoxemia: Likely due to his low oxygen carrying capacity on admission. However he had an elevated D-dimer so a CT angio was pursued and returned negative for PE. After PRBC transfusion his hypoxemia resolved.  Hb SS with pain: His pain was mostly managed on Dilaudid PCA. He was monitored for improvement and felt that his pain became much better after re-hydration and PRBCs. He was then switched to his oral home regimen and given Dilaudid 1mg  IV push PRN. Last IV push was over 12 hours prior to discharge.  Disposition and Follow-up:  Patient is discharged in good condition. He's to followup with his primary care physician within one week.  Future Appointments Provider Department Dept Phone   04/20/2013 3:00 PM Altha Harm, MD  [facility] [phone]      DISCHARGE EXAM:  Filed Vitals:   02/13/13 1035  BP: 111/59  Pulse: 88  Temp: 98.1 F (36.7 C)  Resp: 16   General: Alert, awake, oriented x3, in no acute distress. Well appearing  HEENT: [place]/AT PEERL, EOMI, Anicteric  OROPHARYNX: Moist, No exudate/ erythema/lesions.  Heart: Regular rate and rhythm, without murmurs, rubs, gallops.  Lungs: Crackles noted throughout lung fields. No wheezing no rales.  Abdomen: Soft, nontender, nondistended, positive bowel sounds, no masses no hepatosplenomegaly noted..  Neuro: No focal neurological deficits noted cranial nerves II through XII grossly intact.  Musculoskeletal: No warm swelling or erythema around joints, no spinal tenderness  noted.    Recent Labs  02/12/13 0355 02/13/13 0410  NA 138 138  K 5.3 5.6*  CL 106 107  CO2 21 22  GLUCOSE 93 87  BUN 17 19  CREATININE 0.91 0.91  CALCIUM 8.2* 8.2*    Recent Labs  02/12/13 0355 02/13/13 0410  AST 112* 103*  ALT 43 43  ALKPHOS 105 101  BILITOT 6.1* 5.7*  PROT 7.1 7.4  ALBUMIN 2.8* 2.8*   No results found for this basename: LIPASE, AMYLASE,  in the last 72 hours  Recent Labs  02/12/13 0355 02/13/13 0410  WBC 13.4* 19.2*  NEUTROABS 7.2 13.7*  HGB 7.9* 8.7*  HCT 23.2* 24.4*  MCV 101.8* 97.6  PLT 191 235   Total time for discharge including decision-making face-to-face time is greater than 35 minutes  Signed: Serina Cowper 02/13/2013, 12:15 PM

## 2013-02-13 LAB — COMPREHENSIVE METABOLIC PANEL
ALT: 43 U/L (ref 0–53)
AST: 103 U/L — AB (ref 0–37)
Albumin: 2.8 g/dL — ABNORMAL LOW (ref 3.5–5.2)
Alkaline Phosphatase: 101 U/L (ref 39–117)
BUN: 19 mg/dL (ref 6–23)
CALCIUM: 8.2 mg/dL — AB (ref 8.4–10.5)
CO2: 22 mEq/L (ref 19–32)
Chloride: 107 mEq/L (ref 96–112)
Creatinine, Ser: 0.91 mg/dL (ref 0.50–1.35)
GFR calc non Af Amer: >90 mL/min (ref >90)
Glucose, Bld: 87 mg/dL (ref 70–99)
POTASSIUM: 5.6 meq/L — AB (ref 3.7–5.3)
Sodium: 138 mEq/L (ref 137–147)
TOTAL PROTEIN: 7.4 g/dL (ref 6.0–8.3)
Total Bilirubin: 5.7 mg/dL — ABNORMAL HIGH (ref 0.3–1.2)

## 2013-02-13 LAB — CBC WITH DIFFERENTIAL/PLATELET
BASOS ABS: 0.2 10*3/uL — AB (ref 0.0–0.1)
BASOS PCT: 1 % (ref 0–1)
EOS ABS: 0.8 10*3/uL — AB (ref 0.0–0.7)
Eosinophils Relative: 4 % (ref 0–5)
HEMATOCRIT: 24.4 % — AB (ref 39.0–52.0)
HEMOGLOBIN: 8.7 g/dL — AB (ref 13.0–17.0)
LYMPHS ABS: 3.5 10*3/uL (ref 0.7–4.0)
LYMPHS PCT: 18 % (ref 12–46)
MCH: 34.8 pg — ABNORMAL HIGH (ref 26.0–34.0)
MCHC: 35.7 g/dL (ref 30.0–36.0)
MCV: 97.6 fL (ref 78.0–100.0)
MONOS PCT: 5 % (ref 3–12)
Monocytes Absolute: 1 10*3/uL (ref 0.1–1.0)
Neutro Abs: 13.7 10*3/uL — ABNORMAL HIGH (ref 1.7–7.7)
Neutrophils Relative %: 72 % (ref 43–77)
Platelets: 235 10*3/uL (ref 150–400)
RBC: 2.5 MIL/uL — ABNORMAL LOW (ref 4.22–5.81)
RDW: 23.1 % — ABNORMAL HIGH (ref 11.5–15.5)
WBC: 19.2 10*3/uL — ABNORMAL HIGH (ref 4.0–10.5)

## 2013-02-13 MED ORDER — SODIUM POLYSTYRENE SULFONATE 15 GM/60ML PO SUSP
30.0000 g | Freq: Once | ORAL | Status: AC
  Administered 2013-02-13: 30 g via ORAL
  Filled 2013-02-13: qty 120

## 2013-02-13 NOTE — Progress Notes (Signed)
Pt. Was discharged home he was given his discharge instructions and all questions were answered.  Pt. Declined a wheelchair to escort him out.

## 2013-02-14 ENCOUNTER — Telehealth: Payer: Self-pay | Admitting: Internal Medicine

## 2013-02-14 ENCOUNTER — Encounter: Payer: Self-pay | Admitting: Internal Medicine

## 2013-02-14 LAB — OPIATES/OPIOIDS (LC/MS-MS)
CODEINE URINE: NEGATIVE ng/mL
HYDROCODONE: NEGATIVE ng/mL
Heroin (6-AM), UR: NEGATIVE ng/mL
Hydromorphone: 745 ng/mL — AB
Morphine Urine: NEGATIVE ng/mL
NORHYDROCODONE, UR: NEGATIVE ng/mL
Noroxycodone, Ur: NEGATIVE ng/mL
Oxycodone, ur: NEGATIVE ng/mL
Oxymorphone: NEGATIVE ng/mL

## 2013-02-14 LAB — PRESCRIPTION MONITORING PROFILE (SOLSTAS)
Amphetamine/Meth: NEGATIVE ng/mL
BARBITURATE SCREEN, URINE: NEGATIVE ng/mL
Buprenorphine, Urine: NEGATIVE ng/mL
CREATININE, URINE: 77.48 mg/dL (ref >20.0)
Cannabinoid Scrn, Ur: NEGATIVE ng/mL
Carisoprodol, Urine: NEGATIVE ng/mL
Cocaine Metabolites: NEGATIVE ng/mL
Fentanyl, Ur: NEGATIVE ng/mL
MDMA URINE: NEGATIVE ng/mL
Meperidine, Ur: NEGATIVE ng/mL
NITRITES URINE, INITIAL: NEGATIVE ug/mL
Oxycodone Screen, Ur: NEGATIVE ng/mL
PROPOXYPHENE: NEGATIVE ng/mL
TAPENTADOLUR: NEGATIVE ng/mL
Tramadol Scrn, Ur: NEGATIVE ng/mL
Zolpidem, Urine: NEGATIVE ng/mL
pH, Initial: 5.2 pH (ref 4.5–8.9)

## 2013-02-14 LAB — BENZODIAZEPINES (GC/LC/MS), URINE
ALPRAZOLAMU: NEGATIVE ng/mL
Clonazepam metabolite (GC/LC/MS), ur confirm: NEGATIVE ng/mL
Diazepam (GC/LC/MS), ur confirm: NEGATIVE ng/mL
ESTAZOLAMU: NEGATIVE ng/mL
Flunitrazepam metabolite (GC/LC/MS), ur confirm: NEGATIVE ng/mL
Flurazepam metabolite (GC/LC/MS), ur confirm: NEGATIVE ng/mL
HYDROXYALPRAZ UR: NEGATIVE ng/mL
Lorazepam (GC/LC/MS), ur confirm: NEGATIVE ng/mL
MIDAZOLAMU: NEGATIVE ng/mL
Nordiazepam (GC/LC/MS), ur confirm: NEGATIVE ng/mL
OXAZEPAMU: NEGATIVE ng/mL
Temazepam (GC/LC/MS), ur confirm: NEGATIVE ng/mL
Triazolam metabolite (GC/LC/MS), ur confirm: NEGATIVE ng/mL

## 2013-02-14 LAB — METHADONE (GC/LC/MS), URINE
EDDPUC: 596 ng/mL — AB
Methadone (GC/LC/MS), ur confirm: 773 ng/mL — AB

## 2013-02-14 NOTE — Telephone Encounter (Signed)
Work note faxed to [employer] attention Fax# [phone]

## 2013-02-14 NOTE — Telephone Encounter (Signed)
Patient called requesting note to return to work. Needs note faxed to [phone], Attn: Minna AntisShayna James

## 2013-02-16 ENCOUNTER — Encounter: Payer: Self-pay | Admitting: Internal Medicine

## 2013-03-17 ENCOUNTER — Telehealth: Payer: Self-pay | Admitting: Internal Medicine

## 2013-03-17 DIAGNOSIS — D571 Sickle-cell disease without crisis: Secondary | ICD-10-CM

## 2013-03-17 NOTE — Telephone Encounter (Signed)
Refill request for methadone (DOLOPHINE) 10 MG tablet  

## 2013-03-18 MED ORDER — METHADONE HCL 10 MG PO TABS: 10.0000 mg | ORAL_TABLET | Freq: Every day | ORAL | Status: DC

## 2013-03-18 NOTE — Telephone Encounter (Signed)
Prescription filled for Methadone 10 mg #30 tabs.

## 2013-03-24 ENCOUNTER — Telehealth: Payer: Self-pay | Admitting: Internal Medicine

## 2013-03-24 DIAGNOSIS — G4700 Insomnia, unspecified: Secondary | ICD-10-CM

## 2013-03-24 DIAGNOSIS — D5700 Hb-SS disease with crisis, unspecified: Secondary | ICD-10-CM

## 2013-03-24 MED ORDER — ZOLPIDEM TARTRATE 10 MG PO TABS: ORAL_TABLET | ORAL | Status: DC

## 2013-03-24 MED ORDER — HYDROMORPHONE HCL 4 MG PO TABS: 4.0000 mg | ORAL_TABLET | ORAL | Status: DC | PRN

## 2013-03-24 NOTE — Telephone Encounter (Signed)
Refill request for dilaudid and ambien. Routed to Dr. Ashley RoyaltyMatthews.

## 2013-03-24 NOTE — Telephone Encounter (Signed)
Prescription filled for Ambien 10 mg #30 with 3 refills and Hydromorphone 4 mg #45 to be taken q 4 hours as needed for pain.

## 2013-04-18 ENCOUNTER — Ambulatory Visit: Payer: BC Managed Care – PPO | Admitting: Internal Medicine

## 2013-04-20 ENCOUNTER — Ambulatory Visit: Payer: BC Managed Care – PPO | Admitting: Internal Medicine

## 2013-04-21 ENCOUNTER — Telehealth: Payer: Self-pay | Admitting: Internal Medicine

## 2013-04-21 ENCOUNTER — Encounter: Payer: Self-pay | Admitting: Internal Medicine

## 2013-04-21 ENCOUNTER — Ambulatory Visit (INDEPENDENT_AMBULATORY_CARE_PROVIDER_SITE_OTHER): Payer: BC Managed Care – PPO | Admitting: Internal Medicine

## 2013-04-21 VITALS — BP 129/67 | HR 96 | Temp 98.9°F | Resp 20 | Ht 71.0 in | Wt 183.0 lb

## 2013-04-21 DIAGNOSIS — D5700 Hb-SS disease with crisis, unspecified: Secondary | ICD-10-CM

## 2013-04-21 DIAGNOSIS — T50905A Adverse effect of unspecified drugs, medicaments and biological substances, initial encounter: Secondary | ICD-10-CM

## 2013-04-21 DIAGNOSIS — T887XXA Unspecified adverse effect of drug or medicament, initial encounter: Secondary | ICD-10-CM

## 2013-04-21 DIAGNOSIS — Z23 Encounter for immunization: Secondary | ICD-10-CM

## 2013-04-21 DIAGNOSIS — Z609 Problem related to social environment, unspecified: Secondary | ICD-10-CM

## 2013-04-21 DIAGNOSIS — R000 Tachycardia, unspecified: Secondary | ICD-10-CM

## 2013-04-21 DIAGNOSIS — N19 Unspecified kidney failure: Secondary | ICD-10-CM

## 2013-04-21 DIAGNOSIS — Z604 Social exclusion and rejection: Secondary | ICD-10-CM

## 2013-04-21 DIAGNOSIS — F411 Generalized anxiety disorder: Secondary | ICD-10-CM

## 2013-04-21 DIAGNOSIS — D571 Sickle-cell disease without crisis: Secondary | ICD-10-CM

## 2013-04-21 LAB — CBC WITH DIFFERENTIAL/PLATELET
Basophils Absolute: 0.2 10*3/uL — ABNORMAL HIGH (ref 0.0–0.1)
Basophils Relative: 1 % (ref 0–1)
EOS PCT: 3 % (ref 0–5)
Eosinophils Absolute: 0.6 10*3/uL (ref 0.0–0.7)
HEMATOCRIT: 19.9 % — AB (ref 39.0–52.0)
Hemoglobin: 7.3 g/dL — ABNORMAL LOW (ref 13.0–17.0)
LYMPHS ABS: 7 10*3/uL — AB (ref 0.7–4.0)
LYMPHS PCT: 38 % (ref 12–46)
MCH: 33.3 pg (ref 26.0–34.0)
MCHC: 36.7 g/dL — ABNORMAL HIGH (ref 30.0–36.0)
MCV: 90.9 fL (ref 78.0–100.0)
MONOS PCT: 9 % (ref 3–12)
Monocytes Absolute: 1.7 10*3/uL — ABNORMAL HIGH (ref 0.1–1.0)
NEUTROS ABS: 9.1 10*3/uL — AB (ref 1.7–7.7)
Neutrophils Relative %: 49 % (ref 43–77)
Platelets: 307 10*3/uL (ref 150–400)
RBC: 2.19 MIL/uL — ABNORMAL LOW (ref 4.22–5.81)
RDW: 23.7 % — ABNORMAL HIGH (ref 11.5–15.5)
WBC: 18.5 10*3/uL — ABNORMAL HIGH (ref 4.0–10.5)

## 2013-04-21 MED ORDER — METHADONE HCL 10 MG PO TABS: 10.0000 mg | ORAL_TABLET | Freq: Every day | ORAL | Status: DC

## 2013-04-21 MED ORDER — HYDROXYUREA 500 MG PO CAPS: 1000.0000 mg | ORAL_CAPSULE | Freq: Every day | ORAL | Status: DC

## 2013-04-21 MED ORDER — METOPROLOL TARTRATE 50 MG PO TABS: 50.0000 mg | ORAL_TABLET | Freq: Two times a day (BID) | ORAL | Status: DC

## 2013-04-21 MED ORDER — ALPRAZOLAM 0.25 MG PO TABS: 0.2500 mg | ORAL_TABLET | Freq: Three times a day (TID) | ORAL | Status: DC | PRN

## 2013-04-21 MED ORDER — FOLIC ACID 1 MG PO TABS: 1.0000 mg | ORAL_TABLET | Freq: Every day | ORAL | Status: AC

## 2013-04-21 MED ORDER — HYDROMORPHONE HCL 4 MG PO TABS: 4.0000 mg | ORAL_TABLET | ORAL | Status: DC | PRN

## 2013-04-21 NOTE — Telephone Encounter (Signed)
Called to confirm appointment. Patient is en route to clinic.

## 2013-04-21 NOTE — Progress Notes (Signed)
Subjective:    Patient ID: [name], male    DOB: 1970/08/30, 43 y.o.   MRN: [id number]  HPI: Pt here today for follow up and states that he feels well. He has had only minor crises which he has been able to treat with his oral analgesics and hydration. Pt states that his affect has improved and he is engaging in more social activity recently. He has no significant pain today and denies any CP, palpitations, dizziness, nausea, vomiting or diarrhea. He admits that he has not been fully compliant with the use of his B-Blocker.     Review of Systems  Constitutional: Negative.   HENT: Negative.   Eyes: Negative.   Respiratory: Negative.   Cardiovascular: Negative.   Gastrointestinal: Negative.   Endocrine: Negative.   Genitourinary: Negative.   Musculoskeletal: Positive for arthralgias and myalgias.  Skin: Negative.   Allergic/Immunologic: Negative.   Neurological: Negative.   Hematological: Negative.   Psychiatric/Behavioral: Negative.        Objective:   Physical Exam  Vitals reviewed. Constitutional: He is oriented to person, place, and time. He appears well-developed and well-nourished. He appears distressed.  HENT:  Head: Atraumatic.  Eyes: Conjunctivae and EOM are normal. Pupils are equal, round, and reactive to light. No scleral icterus.  Neck: Normal range of motion. Neck supple.  Cardiovascular: Normal rate and regular rhythm.  Exam reveals no gallop and no friction rub.   No murmur heard. Pulmonary/Chest: Effort normal and breath sounds normal. He has no wheezes. He has no rales. He exhibits no tenderness.  Abdominal: Soft. Bowel sounds are normal. He exhibits no mass.  Musculoskeletal: Normal range of motion.  Neurological: He is alert and oriented to person, place, and time.  Skin: Skin is warm and dry.  Psychiatric: He has a normal mood and affect. His behavior is normal. Judgment and thought content normal.          Assessment & Plan:  1. Renal  insufficiency: Pt has had a history of reversible renal failure on several occasions. He has no symptoms of urea however he has also had hyperkalemia accompanying the renal insufficiency in th past. I will check his electrolytes and renal function today.  2. H/O hyperkalemia: Pt is currently not on any potassium supplements. Will check electrolytes today.  3. Hb SS without crisis: Clinically he appears stable. I will check his CBC to assess Hb and indices related to his use of Hydrea.  - Sickle cell disease - Continue Hydrea 1000 mg daily. We discussed the need for good hydration, monitoring of hydration status, avoidance of heat, cold, stress, and infection triggers. We discussed the risks and benefits of Hydrea, including bone marrow suppression, the possibility of GI upset, skin ulcers, hair thinning, and teratogenicity. The patient was reminded of the need to seek medical attention of any symptoms of bleeding, anemia, or infection. Continue folic acid 1 mg daily to prevent aplastic bone marrow crises.   - Pulmonary evaluation - Patient denies severe recurrent wheezes, shortness of breath with exercise, or persistent cough. If these symptoms develop, pulmonary function tests with spirometry will be ordered, and if abnormal, plan on referral to Pulmonology for further evaluation.  -Cardiac - Routine screening for pulmonary hypertension is not recommended. Pt on Methadone so will check EKG for QTc interval.  - Eye - High risk of proliferative retinopathy. Annual eye exam with retinal exam recommended to patient.  - Immunization status - Pt  up to date with influenza,  Meningococcal and Pneumococcal vaccines. Need TDAp. Will order today.  - Acute and chronic painful episodes - We reviewed use of Dilaudid 4 mg and pt's average use has been 45 pills in a month. He is also on MEthadone 10 mg q HS which seems to be working well for him. Will check EKG today. We discussed that she is to receive his  Schedule II prescriptions only from us. He is also aware that her prescription history is available to us online through the [facility]. Controlled substance agreement signed (Date). We reminded (Pt) that all patients receiving Schedule II narcotics must be seen for follow up every three months. We reviewed the terms of our pain agreement, including the need to keep medicines in a safe locked location away from children or pets, and the need to report excess sedation or constipation, measures to avoid constipation, and policies related to early refills and stolen prescriptions. According to the [place] [facility] program, we have reviewed details related to analgesia, adverse effects, aberrant behaviors.  - Iron overload from chronic transfusion. Last Frretin 3489. Pt not currently taking Exjade. He has been resistant to taking Exjade. Will discuss with him again. Order Exjade today.  -Vitamin D deficiency - No currrent Vitamin D levels available. Will re-order lab today.   The above recommendations are taken from the NIH Evidence-Based Management of Sickle Cell Disease: Expert Panel Report, 1610920149.   4. SVT: Pt has had episodes of SVT and was seen by Cardiology who recommended Metoprolol 50 mg BID.He has not been fully compliant. HR today 96 BPM however pt reports that HR usually in 70's. Will continue Metoprolol at current dose.  5. Anxiety: Pt states that he has not needed the Xanax recently. However he would like to have some on hand as he will be moving soon which has already created stress for which he has felt the need for his xanax. Will re-issue prescription today.  Labs: BMET, CBC with diff  Studies: 12 lead EKG  RTC: 1 moth for Annual Physical.

## 2013-04-22 ENCOUNTER — Encounter: Payer: Self-pay | Admitting: Internal Medicine

## 2013-04-22 DIAGNOSIS — T50905A Adverse effect of unspecified drugs, medicaments and biological substances, initial encounter: Secondary | ICD-10-CM | POA: Insufficient documentation

## 2013-04-22 DIAGNOSIS — N19 Unspecified kidney failure: Secondary | ICD-10-CM | POA: Insufficient documentation

## 2013-04-22 DIAGNOSIS — Z23 Encounter for immunization: Secondary | ICD-10-CM | POA: Insufficient documentation

## 2013-04-22 LAB — BASIC METABOLIC PANEL
BUN: 19 mg/dL (ref 6–23)
CHLORIDE: 106 meq/L (ref 96–112)
CO2: 22 meq/L (ref 19–32)
CREATININE: 1.08 mg/dL (ref 0.50–1.35)
Calcium: 8.3 mg/dL — ABNORMAL LOW (ref 8.4–10.5)
GLUCOSE: 58 mg/dL — AB (ref 70–99)
POTASSIUM: 6 meq/L — AB (ref 3.5–5.3)
Sodium: 135 mEq/L (ref 135–145)

## 2013-05-05 ENCOUNTER — Encounter: Payer: Self-pay | Admitting: Internal Medicine

## 2013-05-23 ENCOUNTER — Telehealth: Payer: Self-pay | Admitting: Internal Medicine

## 2013-05-23 DIAGNOSIS — D5700 Hb-SS disease with crisis, unspecified: Secondary | ICD-10-CM

## 2013-05-23 DIAGNOSIS — D571 Sickle-cell disease without crisis: Secondary | ICD-10-CM

## 2013-05-23 MED ORDER — METHADONE HCL 10 MG PO TABS: 10.0000 mg | ORAL_TABLET | Freq: Every day | ORAL | Status: DC

## 2013-05-23 MED ORDER — HYDROMORPHONE HCL 4 MG PO TABS: 4.0000 mg | ORAL_TABLET | ORAL | Status: DC | PRN

## 2013-05-23 NOTE — Telephone Encounter (Signed)
Medication refill for methadone (DOLOPHINE) 10 MG tablet, HYDROmorphone (DILAUDID) 4 MG tablet  Last office visit 04/21/2013

## 2013-05-23 NOTE — Telephone Encounter (Signed)
Prescription refilled for Methadone 10 mg #60 and Dilaudid 4 mg # 90.

## 2013-06-29 ENCOUNTER — Telehealth: Payer: Self-pay | Admitting: Internal Medicine

## 2013-06-29 DIAGNOSIS — D5700 Hb-SS disease with crisis, unspecified: Secondary | ICD-10-CM

## 2013-06-29 DIAGNOSIS — D571 Sickle-cell disease without crisis: Secondary | ICD-10-CM

## 2013-06-29 MED ORDER — HYDROMORPHONE HCL 4 MG PO TABS: 4.0000 mg | ORAL_TABLET | ORAL | Status: DC | PRN

## 2013-06-29 MED ORDER — METHADONE HCL 10 MG PO TABS
10.0000 mg | ORAL_TABLET | Freq: Every day | ORAL | Status: DC
Start: 2013-06-29 — End: 2013-08-08

## 2013-06-29 NOTE — Telephone Encounter (Signed)
Medication refill request for HYDROmorphone (DILAUDID) 4 MG tablet, methadone (DOLOPHINE) 10 MG tablet / LOV 04/21/2013 /

## 2013-06-29 NOTE — Telephone Encounter (Signed)
Prescription refilled for Methadone 10 mg #30 and Dilaudid 4 mg #45

## 2013-07-06 ENCOUNTER — Inpatient Hospital Stay (HOSPITAL_COMMUNITY)
Admission: EM | Admit: 2013-07-06 | Discharge: 2013-07-10 | DRG: 812 | Disposition: A | Discharge status: Home / Self Care | Payer: BC Managed Care – PPO | Attending: Family Medicine | Admitting: Family Medicine

## 2013-07-06 ENCOUNTER — Emergency Department (HOSPITAL_COMMUNITY): Payer: BC Managed Care – PPO

## 2013-07-06 ENCOUNTER — Encounter (HOSPITAL_COMMUNITY): Payer: Self-pay | Admitting: Emergency Medicine

## 2013-07-06 DIAGNOSIS — R0902 Hypoxemia: Secondary | ICD-10-CM | POA: Diagnosis present

## 2013-07-06 DIAGNOSIS — E875 Hyperkalemia: Secondary | ICD-10-CM | POA: Diagnosis present

## 2013-07-06 DIAGNOSIS — D571 Sickle-cell disease without crisis: Secondary | ICD-10-CM | POA: Diagnosis present

## 2013-07-06 DIAGNOSIS — D5700 Hb-SS disease with crisis, unspecified: Principal | ICD-10-CM

## 2013-07-06 DIAGNOSIS — Z9089 Acquired absence of other organs: Secondary | ICD-10-CM

## 2013-07-06 DIAGNOSIS — I5030 Unspecified diastolic (congestive) heart failure: Secondary | ICD-10-CM | POA: Diagnosis present

## 2013-07-06 DIAGNOSIS — I509 Heart failure, unspecified: Secondary | ICD-10-CM | POA: Diagnosis present

## 2013-07-06 DIAGNOSIS — D649 Anemia, unspecified: Secondary | ICD-10-CM

## 2013-07-06 DIAGNOSIS — E860 Dehydration: Secondary | ICD-10-CM | POA: Diagnosis present

## 2013-07-06 DIAGNOSIS — Z79899 Other long term (current) drug therapy: Secondary | ICD-10-CM

## 2013-07-06 DIAGNOSIS — D72829 Elevated white blood cell count, unspecified: Secondary | ICD-10-CM

## 2013-07-06 LAB — CBC WITH DIFFERENTIAL/PLATELET
Band Neutrophils: 0 % (ref 0–10)
Basophils Absolute: 0.4 10*3/uL — ABNORMAL HIGH (ref 0.0–0.1)
Basophils Relative: 2 % — ABNORMAL HIGH (ref 0–1)
Blasts: 0 %
EOS ABS: 0.9 10*3/uL — AB (ref 0.0–0.7)
EOS PCT: 5 % (ref 0–5)
HCT: 18.7 % — ABNORMAL LOW (ref 39.0–52.0)
Hemoglobin: 6.4 g/dL — CL (ref 13.0–17.0)
LYMPHS ABS: 4 10*3/uL (ref 0.7–4.0)
LYMPHS PCT: 21 % (ref 12–46)
MCH: 34 pg (ref 26.0–34.0)
MCHC: 34.2 g/dL (ref 30.0–36.0)
MCV: 99.5 fL (ref 78.0–100.0)
MONO ABS: 1.3 10*3/uL — AB (ref 0.1–1.0)
MONOS PCT: 7 % (ref 3–12)
Metamyelocytes Relative: 0 %
Myelocytes: 0 %
NEUTROS ABS: 12.3 10*3/uL — AB (ref 1.7–7.7)
NRBC: 69 /100{WBCs} — AB
Neutrophils Relative %: 65 % (ref 43–77)
PLATELETS: 329 10*3/uL (ref 150–400)
Promyelocytes Absolute: 0 %
RBC: 1.88 MIL/uL — AB (ref 4.22–5.81)
RDW: 35.6 % — ABNORMAL HIGH (ref 11.5–15.5)
WBC: 18.9 10*3/uL — AB (ref 4.0–10.5)

## 2013-07-06 LAB — URINALYSIS, ROUTINE W REFLEX MICROSCOPIC
Bilirubin Urine: NEGATIVE
Glucose, UA: NEGATIVE mg/dL
Ketones, ur: NEGATIVE mg/dL
Leukocytes, UA: NEGATIVE
NITRITE: NEGATIVE
PROTEIN: 100 mg/dL — AB
SPECIFIC GRAVITY, URINE: 1.01 (ref 1.005–1.030)
Urobilinogen, UA: 1 mg/dL (ref 0.0–1.0)
pH: 6.5 (ref 5.0–8.0)

## 2013-07-06 LAB — COMPREHENSIVE METABOLIC PANEL
ALT: 49 U/L (ref 0–53)
AST: 134 U/L — ABNORMAL HIGH (ref 0–37)
Albumin: 3 g/dL — ABNORMAL LOW (ref 3.5–5.2)
Alkaline Phosphatase: 133 U/L — ABNORMAL HIGH (ref 39–117)
BILIRUBIN TOTAL: 6.8 mg/dL — AB (ref 0.3–1.2)
BUN: 20 mg/dL (ref 6–23)
CALCIUM: 8.5 mg/dL (ref 8.4–10.5)
CO2: 19 meq/L (ref 19–32)
CREATININE: 0.9 mg/dL (ref 0.50–1.35)
Chloride: 111 mEq/L (ref 96–112)
GLUCOSE: 118 mg/dL — AB (ref 70–99)
Potassium: 5.2 mEq/L (ref 3.7–5.3)
Sodium: 141 mEq/L (ref 137–147)
Total Protein: 7.5 g/dL (ref 6.0–8.3)

## 2013-07-06 LAB — RETICULOCYTES: RBC.: 1.88 MIL/uL — AB (ref 4.22–5.81)

## 2013-07-06 LAB — URINE MICROSCOPIC-ADD ON

## 2013-07-06 LAB — SAMPLE TO BLOOD BANK

## 2013-07-06 LAB — PRO B NATRIURETIC PEPTIDE: Pro B Natriuretic peptide (BNP): 482.9 pg/mL — ABNORMAL HIGH (ref 0–125)

## 2013-07-06 LAB — I-STAT TROPONIN, ED: Troponin i, poc: 0.01 ng/mL (ref 0.00–0.08)

## 2013-07-06 LAB — PREPARE RBC (CROSSMATCH)

## 2013-07-06 MED ORDER — SODIUM CHLORIDE 0.9 % IV BOLUS (SEPSIS)
1000.0000 mL | Freq: Once | INTRAVENOUS | Status: AC
  Administered 2013-07-06: 1000 mL via INTRAVENOUS

## 2013-07-06 MED ORDER — SODIUM CHLORIDE 0.9 % IV SOLN: 1000.0000 mL | INTRAVENOUS | Status: DC

## 2013-07-06 MED ORDER — METOPROLOL TARTRATE 50 MG PO TABS
50.0000 mg | ORAL_TABLET | Freq: Two times a day (BID) | ORAL | Status: DC
  Administered 2013-07-06 – 2013-07-10 (×8): 50 mg via ORAL
  Filled 2013-07-06 (×9): qty 1

## 2013-07-06 MED ORDER — NALOXONE HCL 0.4 MG/ML IJ SOLN: 0.4000 mg | INTRAMUSCULAR | Status: DC | PRN

## 2013-07-06 MED ORDER — SODIUM CHLORIDE 0.9 % IJ SOLN: 9.0000 mL | INTRAMUSCULAR | Status: DC | PRN

## 2013-07-06 MED ORDER — ALPRAZOLAM 0.25 MG PO TABS: 0.2500 mg | ORAL_TABLET | Freq: Three times a day (TID) | ORAL | Status: DC | PRN

## 2013-07-06 MED ORDER — SODIUM CHLORIDE 0.9 % IV SOLN
Freq: Once | INTRAVENOUS | Status: AC
  Administered 2013-07-06: 19:00:00 via INTRAVENOUS

## 2013-07-06 MED ORDER — ONDANSETRON HCL 4 MG/2ML IJ SOLN: 4.0000 mg | Freq: Four times a day (QID) | INTRAMUSCULAR | Status: DC | PRN

## 2013-07-06 MED ORDER — DIPHENHYDRAMINE HCL 50 MG/ML IJ SOLN: 12.5000 mg | Freq: Four times a day (QID) | INTRAMUSCULAR | Status: DC | PRN

## 2013-07-06 MED ORDER — KETOROLAC TROMETHAMINE 30 MG/ML IJ SOLN
30.0000 mg | Freq: Four times a day (QID) | INTRAMUSCULAR | Status: DC
  Administered 2013-07-06 – 2013-07-07 (×2): 30 mg via INTRAVENOUS
  Filled 2013-07-06 (×12): qty 1

## 2013-07-06 MED ORDER — ONDANSETRON HCL 4 MG/2ML IJ SOLN
4.0000 mg | INTRAMUSCULAR | Status: DC | PRN
Start: 2013-07-06 — End: 2013-07-10
  Administered 2013-07-06: 4 mg via INTRAVENOUS
  Filled 2013-07-06: qty 2

## 2013-07-06 MED ORDER — HEPARIN SODIUM (PORCINE) 5000 UNIT/ML IJ SOLN
5000.0000 [IU] | Freq: Three times a day (TID) | INTRAMUSCULAR | Status: DC
Start: 2013-07-06 — End: 2013-07-10
  Administered 2013-07-06: 5000 [IU] via SUBCUTANEOUS
  Filled 2013-07-06 (×15): qty 1

## 2013-07-06 MED ORDER — SODIUM CHLORIDE 0.9 % IV SOLN: 1000.0000 mL | Freq: Once | INTRAVENOUS | Status: DC

## 2013-07-06 MED ORDER — ZOLPIDEM TARTRATE 5 MG PO TABS
5.0000 mg | ORAL_TABLET | Freq: Every day | ORAL | Status: DC
  Administered 2013-07-06 – 2013-07-09 (×4): 5 mg via ORAL
  Filled 2013-07-06 (×4): qty 1

## 2013-07-06 MED ORDER — HYDROMORPHONE 0.3 MG/ML IV SOLN
INTRAVENOUS | Status: DC
  Administered 2013-07-07: 01:00:00 via INTRAVENOUS
  Administered 2013-07-07: 2.7 mg via INTRAVENOUS
  Administered 2013-07-07: 11:00:00 via INTRAVENOUS
  Administered 2013-07-07: 1.5 mg via INTRAVENOUS
  Filled 2013-07-06 (×2): qty 25

## 2013-07-06 MED ORDER — HYDROMORPHONE HCL PF 2 MG/ML IJ SOLN
2.0000 mg | Freq: Once | INTRAMUSCULAR | Status: AC
  Administered 2013-07-06: 2 mg via INTRAVENOUS
  Filled 2013-07-06: qty 1

## 2013-07-06 MED ORDER — HYDROMORPHONE HCL PF 2 MG/ML IJ SOLN
2.0000 mg | INTRAMUSCULAR | Status: AC | PRN
  Administered 2013-07-06 (×2): 2 mg via INTRAVENOUS
  Filled 2013-07-06 (×2): qty 1

## 2013-07-06 MED ORDER — DIPHENHYDRAMINE HCL 12.5 MG/5ML PO ELIX: 12.5000 mg | ORAL_SOLUTION | Freq: Four times a day (QID) | ORAL | Status: DC | PRN

## 2013-07-06 MED ORDER — SODIUM CHLORIDE 0.9 % IV SOLN
INTRAVENOUS | Status: DC
  Administered 2013-07-06 – 2013-07-10 (×8): via INTRAVENOUS

## 2013-07-06 NOTE — ED Provider Notes (Signed)
CSN: [id number]     Arrival date & time 07/06/13  1643 History   First MD Initiated Contact with Patient 07/06/13 1720     Chief Complaint  Patient presents with  . Sickle Cell Pain Crisis     (Consider location/radiation/quality/duration/timing/severity/associated sxs/prior Treatment) HPI [name] is a 43 y.o. male with history of sickle cell disease, presents to emergency department with acute crisis. Patient states he developed pain in his hips, knees, feet, yesterday. States that this feels like his sickle cell crisis. He took his regular doses of Dilaudid, ibuprofen, methadone yesterday, states that did help. Today after taking his medications there is no improvement in his pain. Patient also reports right upper quadrant abdominal pain. Patient states he has associated weakness. Denies nausea vomiting, diarrhea States "I feel like he needed a blood transfusion." Patient is followed by Dr. Ashley Royalty. Last sickle cell crisis 5 months ago. Patient states his pain is worsened with movement. It does not radiate. Nothing is making it better or worse. He denies any fevers, no cough or shortness of breath, no chest pain.  Past Medical History  Diagnosis Date  . CHF (congestive heart failure)   . Gouty arthropathy   . Chronic fatigue syndrome   . Sickle cell anemia   . Anxiety   . Depression   . Hand joint pain 10/19/2012  . Hyperkalemia 03/25/2011   Past Surgical History  Procedure Laterality Date  . Appendectomy    . Cholecystectomy    . Splenectomy     Family History  Problem Relation Age of Onset  . Sickle cell anemia    . Sickle cell trait Mother    History  Substance Use Topics  . Smoking status: Never Smoker   . Smokeless tobacco: Never Used  . Alcohol Use: No    Review of Systems  Constitutional: Positive for fatigue. Negative for fever and chills.  HENT: Negative.   Respiratory: Negative for cough, chest tightness and shortness of breath.   Cardiovascular:  Negative for chest pain, palpitations and leg swelling.  Gastrointestinal: Positive for abdominal pain. Negative for nausea, vomiting, diarrhea and abdominal distention.  Genitourinary: Negative for dysuria, urgency, frequency and hematuria.  Musculoskeletal: Positive for arthralgias and myalgias. Negative for joint swelling, neck pain and neck stiffness.  Skin: Negative for rash.  Allergic/Immunologic: Negative for immunocompromised state.  Neurological: Positive for weakness. Negative for dizziness, light-headedness, numbness and headaches.  All other systems reviewed and are negative.     Allergies  Review of patient's allergies indicates no known allergies.  Home Medications   Prior to Admission medications   Medication Sig Start Date End Date Taking? Authorizing Provider  ALPRAZolam (XANAX) 0.25 MG tablet Take 1 tablet (0.25 mg total) by mouth 3 (three) times daily as needed for anxiety. 04/21/13   Altha Harm, MD  folic acid (FOLVITE) 1 MG tablet Take 1 tablet (1 mg total) by mouth daily. 04/21/13   Altha Harm, MD  HYDROmorphone (DILAUDID) 4 MG tablet Take 1 tablet (4 mg total) by mouth every 4 (four) hours as needed for severe pain. 06/29/13   Altha Harm, MD  hydroxyurea (HYDREA) 500 MG capsule Take 2 capsules (1,000 mg total) by mouth daily. May take with food to minimize GI side effects. 04/21/13   Altha Harm, MD  ibuprofen (ADVIL,MOTRIN) 800 MG tablet Take 1 tablet (800 mg total) by mouth every 8 (eight) hours as needed for pain. 09/23/12   Grayce Sessions, NP  methadone (  DOLOPHINE) 10 MG tablet Take 1 tablet (10 mg total) by mouth at bedtime. 06/29/13   Altha Harm, MD  metoprolol (LOPRESSOR) 50 MG tablet Take 1 tablet (50 mg total) by mouth 2 (two) times daily. 04/21/13   Altha Harm, MD  zolpidem (AMBIEN) 10 MG tablet take 1 tablet by mouth at bedtime if needed for sleep 03/24/13   Altha Harm, MD   BP 147/75  Pulse 91   Temp(Src) 99.6 F (37.6 C) (Oral)  Resp 16  SpO2 96% Physical Exam  Nursing note and vitals reviewed. Constitutional: He is oriented to person, place, and time. He appears well-developed and well-nourished. No distress.  HENT:  Head: Normocephalic and atraumatic.  Eyes: Conjunctivae are normal. Scleral icterus is present.  Neck: Neck supple.  Cardiovascular: Normal rate, regular rhythm and normal heart sounds.   Pulmonary/Chest: Effort normal. No respiratory distress. He has no wheezes. He has no rales.  Abdominal: Soft. Bowel sounds are normal. He exhibits no distension. There is tenderness. There is no rebound.  Enlarged liver palpated in the right upper abdomen. Mild tenderness to palpation.  Musculoskeletal: He exhibits no edema.  Normal appearing bilateral hips, knees, ankles, feet. Pain with range of motion of bilateral hips, knees, ankles. Dorsal pedal pulses intact bilaterally. No swelling, bruising, erythema noted in extremities. No lumbar spine tenderness.  Neurological: He is alert and oriented to person, place, and time.  Skin: Skin is warm and dry.    ED Course  Procedures (including critical care time) Labs Review Labs Reviewed  CBC WITH DIFFERENTIAL - Abnormal; Notable for the following:    WBC 18.9 (*)    RBC 1.88 (*)    Hemoglobin 6.4 (*)    HCT 18.7 (*)    RDW 35.6 (*)    Basophils Relative 2 (*)    nRBC 69 (*)    Neutro Abs 12.3 (*)    Monocytes Absolute 1.3 (*)    Eosinophils Absolute 0.9 (*)    Basophils Absolute 0.4 (*)    All other components within normal limits  COMPREHENSIVE METABOLIC PANEL - Abnormal; Notable for the following:    Glucose, Bld 118 (*)    Albumin 3.0 (*)    AST 134 (*)    Alkaline Phosphatase 133 (*)    Total Bilirubin 6.8 (*)    All other components within normal limits  RETICULOCYTES - Abnormal; Notable for the following:    Retic Ct Pct >23.0 (*)    RBC. 1.88 (*)    All other components within normal limits  URINALYSIS,  ROUTINE W REFLEX MICROSCOPIC  I-STAT TROPOININ, ED  SAMPLE TO BLOOD BANK  PREPARE RBC (CROSSMATCH)    Imaging Review Dg Chest 2 View  07/06/2013   CLINICAL DATA:  Sickle cell disease.  Chest pain.  EXAM: CHEST  2 VIEW  COMPARISON:  PA and lateral chest 02/09/2013.  CT chest 02/10/2013.  FINDINGS: There is cardiomegaly and vascular congestion. Very small right pleural effusion is unchanged. There is no left pleural effusion or pneumothorax.  IMPRESSION: No acute finding.  Cardiomegaly.  Very small right pleural effusion, unchanged.   Electronically Signed   By: Drusilla Kanner M.D.   On: 07/06/2013 18:38     EKG Interpretation None      MDM   Final diagnoses:  Sickle cell crisis  Anemia  Leukocytosis  Hypoxia    Patient is here with sickle cell crisis, weakness, feels like he may be transfusion. We'll start him on  IV fluids, 1 L ordered at this time, patient does have history of congestive heart failure. Will get chest x-ray, patient's oxygen saturation was low 80s initially, he denies any chest pain or shortness of breath. He is afebrile. He does have right upper quadrant abdominal pain and scleral icterus will get LFTs checked.  7:00 PM  Pt's hgb is 6.4. Will order 1 unit to transfuse. CXR with no significant findings. WBC is high at 18.9, Retic count >23. Pain poorly managed. Will need admission.   7:46 PM Discussed with [facility], will admit. Pain minimally controlled with now 2 doses of pain medications  Filed Vitals:   07/06/13 1653 07/06/13 1654  BP: 147/75   Pulse: 91   Temp: 99.6 F (37.6 C)   TempSrc: Oral   Resp: 16   SpO2: 82% 96%     Lottie Musselatyana A Kenneshia Rehm, PA-C 07/06/13 1948

## 2013-07-06 NOTE — H&P (Addendum)
Hospitalist Admission History and Physical  Patient name: [name] Medical record number: [id number] Date of birth: July 07, 1970 Age: 43 y.o. Gender: male  Primary Care Provider: MATTHEWS,MICHELLE A., MD  Chief Complaint: sickle cell crisis History of Present Illness:This is a 43 y.o. year old male with baseline hx/o sickle cell disease, anemia with baseline hgb 7-8, diastolic CHF  presenting with sickle cell crisis. Pt states that he has had worsening pain over past 24 hours as the weather became cooler from a cold front. Has had pain in hips, knees and ankles. Usual distribution is in hips and knees. Initially had some mild CP that is now resolved. Has had some dyspnea. States that he was taking outpt pain regimen. However, pain was minimally to mildly relieved. Pt also reports that he has not taken hydroxyurea in "a very long time" Presented to ER hemodynamically stable. Tmax 99.6. O2 sats in mid 80s on RA. Supplemental O2 placed. hgb 6.4. Retic count 23. WBC 18.9. CXR with cardiomegaly and small stable R pleural effusion, but no infiltrates concerning for acute chest. Pt feels that he likely needs a blood transfusion and this usually helps resolve his sxs.   Assessment and Plan: [name] is a 43 y.o. year old male presenting with sickle cell crisis   Active Problems:   Sickle cell crisis   Sickle Cell Crisis: pending pRBC transfusion. Scheduled toradol. Dilaudid PCA. IV hydration. Supplemental O2. Retic count appropriate. Given cardiomegaly, check Pro BNP. May also benefit from 2d ECHO. No infiltrate on CXR. Defer abx for now.  Suspect leukocytosis likely reactive in setting of SS crisis. Consider starting Rocephin/cefepime if pt spikes fever/temp escalates. Would also panculture.   BP: hemodynamically stable. Continue home regimen.   FEN/GI: heart healthy diet  Prophylaxis: sub q heparin  Disposition: pending further evaluation  Code Status:Full Code    Patient Active  Problem List   Diagnosis Date Noted  . Need for Tdap vaccination 04/22/2013  . Renal failure, unspecified 04/22/2013  . Medication adverse effect 04/22/2013  . Dehydration 02/10/2013  . SOB (shortness of breath) 02/10/2013  . Acute kidney injury (nontraumatic) 02/09/2013  . Sinus tachycardia by electrocardiogram 10/19/2012  . 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 Medication management 07/05/2012  . Prolonged Q-T interval on ECG 06/22/2012  . 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 Abnormality of gait 06/21/2012  . Drooling 06/21/2012  . Generalized anxiety disorder 06/21/2012  . Social isolation 06/21/2012  . Acute renal failure 02/18/2012  . Right knee pain 02/17/2012  . Acute respiratory distress 02/16/2012  . HCAP (healthcare-associated pneumonia) 02/16/2012  . Hypoxia 02/16/2012  . SVT (supraventricular tachycardia) 02/16/2012  . Anemia 02/16/2012  . Jaundice 02/16/2012  . Sickle cell crisis 12/07/2011  . 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 Chronic pain 03/24/2011  . Hypokalemia 03/24/2011  . Leukocytosis 03/24/2011  . Acute chest syndrome(517.3) 03/24/2011  . 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 Gout 03/23/2011   Past Medical History: Past Medical History  Diagnosis Date  . CHF (congestive heart failure)   . Gouty arthropathy   . Chronic fatigue syndrome   . Sickle cell anemia   . Anxiety   . Depression   . Hand joint pain 10/19/2012  . Hyperkalemia 03/25/2011    Past Surgical History: Past Surgical History  Procedure Laterality Date  . Appendectomy    . Cholecystectomy    . Splenectomy      Social History: History   Social History  . Marital Status: Single  Spouse Name: N/A    Number of Children: N/A  . Years of Education: N/A   Social History Main Topics  . Smoking status: Never Smoker   . Smokeless tobacco: Never Used  . Alcohol Use: No  . Drug Use: No  . Sexual Activity: No   Other Topics Concern  . 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  Estranged from his biological father.    Family History: Family History  Problem Relation Age of Onset  . Sickle cell anemia    . Sickle cell trait Mother     Allergies: No Known Allergies  Current Facility-Administered Medications  Medication Dose Route Frequency Provider Last Rate Last Dose  . 0.9 %  sodium chloride infusion   Intravenous Continuous Doree Albee, MD      . ALPRAZolam Prudy Feeler) tablet 0.25 mg  0.25 mg Oral TID PRN Doree Albee, MD      . heparin injection 5,000 Units  5,000 Units Subcutaneous 3 times per day Doree Albee, MD      . ketorolac (TORADOL) 30 MG/ML injection 30 mg  30 mg Intravenous 4 times per day Doree Albee, MD      . metoprolol tartrate (LOPRESSOR) tablet 50 mg  50 mg Oral BID Doree Albee, MD      . ondansetron [facility]) injection 4 mg  4 mg Intravenous Q4H PRN Tatyana A Kirichenko, PA-C   4 mg at 07/06/13 1808  . zolpidem (AMBIEN) tablet 5 mg  5 mg Oral QHS Doree Albee, MD       Current Outpatient Prescriptions  Medication Sig Dispense Refill  . ALPRAZolam (XANAX) 0.25 MG tablet Take 1 tablet (0.25 mg total) by mouth 3 (three) times daily as needed for anxiety.  90 tablet  2  . folic acid (FOLVITE) 1 MG tablet Take 1 tablet (1 mg total) by mouth daily.  30 tablet  11  . HYDROmorphone (DILAUDID) 4 MG tablet Take 1 tablet (4 mg total) by mouth every 4 (four) hours as needed for severe pain.  45 tablet  0  . hydroxyurea (HYDREA) 500 MG capsule Take 2 capsules (1,000 mg total) by mouth daily. May take with food to minimize GI side effects.  60 capsule  3  . ibuprofen (ADVIL,MOTRIN) 800 MG tablet Take 1 tablet (800 mg total) by mouth every 8 (eight) hours as needed for pain.  90 tablet  3  . methadone (DOLOPHINE) 10 MG tablet Take 1 tablet (10 mg total) by mouth at bedtime.  30 tablet  0  . metoprolol (LOPRESSOR) 50 MG tablet Take 1 tablet (50 mg total) by mouth 2 (two) times daily.  60 tablet  3  . zolpidem (AMBIEN) 10 MG tablet take 1  tablet by mouth at bedtime if needed for sleep  30 tablet  3   Review Of Systems: 12 point ROS negative except as noted above in HPI.  Physical Exam: Filed Vitals:   07/06/13 1653  BP: 147/75  Pulse: 91  Temp: 99.6 F (37.6 C)  Resp: 16    General: alert and cooperative HEENT: PERRLA and extra ocular movement intact Heart: S1, S2 normal, no murmur, rub or gallop, regular rate and rhythm Lungs: clear to auscultation, no wheezes or rales and unlabored breathing Abdomen: abdomen is soft without significant tenderness, masses, organomegaly or guarding Extremities: extremities normal, atraumatic, no cyanosis or edema, mild lower back/hip pain  Skin:no rashes Neurology: normal without focal findings, mental status, speech normal,  alert and oriented x3, PERLA and reflexes normal and symmetric  Labs and Imaging: Lab Results  Component Value Date/Time   NA 141 07/06/2013  5:55 PM   K 5.2 07/06/2013  5:55 PM   CL 111 07/06/2013  5:55 PM   CO2 19 07/06/2013  5:55 PM   BUN 20 07/06/2013  5:55 PM   CREATININE 0.90 07/06/2013  5:55 PM   CREATININE 1.08 04/21/2013  4:37 PM   GLUCOSE 118* 07/06/2013  5:55 PM   Lab Results  Component Value Date   WBC 18.9* 07/06/2013   HGB 6.4* 07/06/2013   HCT 18.7* 07/06/2013   MCV 99.5 07/06/2013   PLT 329 07/06/2013    Dg Chest 2 View  07/06/2013   CLINICAL DATA:  Sickle cell disease.  Chest pain.  EXAM: CHEST  2 VIEW  COMPARISON:  PA and lateral chest 02/09/2013.  CT chest 02/10/2013.  FINDINGS: There is cardiomegaly and vascular congestion. Very small right pleural effusion is unchanged. There is no left pleural effusion or pneumothorax.  IMPRESSION: No acute finding.  Cardiomegaly.  Very small right pleural effusion, unchanged.   Electronically Signed   By: Drusilla Kanner M.D.   On: 07/06/2013 18:38           Doree Albee MD  Pager: [phone]

## 2013-07-06 NOTE — ED Notes (Signed)
Unable to obtain blood

## 2013-07-06 NOTE — ED Notes (Signed)
Was unable to obtain blood

## 2013-07-06 NOTE — ED Notes (Signed)
Patient has order for lab draw and is in need of 2nd IV due to order to transfuse and PCA. He does not want to be stuck again at this time. He is aware that he will not be able to receive medications for pain while blood is transfusing. He is "okay" with that due to multiple sticks today.

## 2013-07-06 NOTE — Progress Notes (Signed)
Pt refuses to answer questions to nursing admission hx at present time. States he is in too much pain currently. Briscoe Burns BSN, RN-BC Admissions RN  07/06/2013 8:14 PM

## 2013-07-06 NOTE — ED Notes (Signed)
Pt c/o sickle cell crisis since yesterday w/ pain in back, knees, and feet.  82% on RA.

## 2013-07-06 NOTE — ED Notes (Signed)
Patient will be transported to the unit following 15 minute vital check of blood transfusion. Receiving nurse made aware.

## 2013-07-07 DIAGNOSIS — D72829 Elevated white blood cell count, unspecified: Secondary | ICD-10-CM

## 2013-07-07 DIAGNOSIS — E860 Dehydration: Secondary | ICD-10-CM

## 2013-07-07 LAB — COMPREHENSIVE METABOLIC PANEL
ALT: 45 U/L (ref 0–53)
AST: 110 U/L — ABNORMAL HIGH (ref 0–37)
Albumin: 2.7 g/dL — ABNORMAL LOW (ref 3.5–5.2)
Alkaline Phosphatase: 118 U/L — ABNORMAL HIGH (ref 39–117)
BUN: 20 mg/dL (ref 6–23)
CALCIUM: 8 mg/dL — AB (ref 8.4–10.5)
CO2: 20 mEq/L (ref 19–32)
Chloride: 113 mEq/L — ABNORMAL HIGH (ref 96–112)
Creatinine, Ser: 1.04 mg/dL (ref 0.50–1.35)
GFR calc non Af Amer: 87 mL/min — ABNORMAL LOW (ref >90)
GLUCOSE: 96 mg/dL (ref 70–99)
Potassium: 6.2 mEq/L — ABNORMAL HIGH (ref 3.7–5.3)
SODIUM: 141 meq/L (ref 137–147)
TOTAL PROTEIN: 6.9 g/dL (ref 6.0–8.3)
Total Bilirubin: 4.9 mg/dL — ABNORMAL HIGH (ref 0.3–1.2)

## 2013-07-07 LAB — BASIC METABOLIC PANEL
BUN: 22 mg/dL (ref 6–23)
CALCIUM: 8.1 mg/dL — AB (ref 8.4–10.5)
CHLORIDE: 114 meq/L — AB (ref 96–112)
CO2: 21 meq/L (ref 19–32)
Creatinine, Ser: 1.04 mg/dL (ref 0.50–1.35)
GFR calc Af Amer: >90 mL/min (ref >90)
GFR calc non Af Amer: 87 mL/min — ABNORMAL LOW (ref >90)
Glucose, Bld: 93 mg/dL (ref 70–99)
Potassium: 6.4 mEq/L — ABNORMAL HIGH (ref 3.7–5.3)
SODIUM: 142 meq/L (ref 137–147)

## 2013-07-07 LAB — CBC WITH DIFFERENTIAL/PLATELET
BASOS ABS: 0.2 10*3/uL — AB (ref 0.0–0.1)
BASOS PCT: 1 % (ref 0–1)
Band Neutrophils: 0 % (ref 0–10)
Blasts: 0 %
EOS ABS: 1.9 10*3/uL — AB (ref 0.0–0.7)
Eosinophils Relative: 11 % — ABNORMAL HIGH (ref 0–5)
HEMATOCRIT: 18 % — AB (ref 39.0–52.0)
Hemoglobin: 6.3 g/dL — CL (ref 13.0–17.0)
LYMPHS ABS: 8.4 10*3/uL — AB (ref 0.7–4.0)
LYMPHS PCT: 50 % — AB (ref 12–46)
MCH: 34.8 pg — ABNORMAL HIGH (ref 26.0–34.0)
MCHC: 35 g/dL (ref 30.0–36.0)
MCV: 99.4 fL (ref 78.0–100.0)
MONOS PCT: 6 % (ref 3–12)
Metamyelocytes Relative: 0 %
Monocytes Absolute: 1 10*3/uL (ref 0.1–1.0)
Myelocytes: 0 %
Neutro Abs: 5.4 10*3/uL (ref 1.7–7.7)
Neutrophils Relative %: 32 % — ABNORMAL LOW (ref 43–77)
Platelets: 227 10*3/uL (ref 150–400)
Promyelocytes Absolute: 0 %
RBC: 1.81 MIL/uL — ABNORMAL LOW (ref 4.22–5.81)
RDW: 33.9 % — ABNORMAL HIGH (ref 11.5–15.5)
WBC: 16.9 10*3/uL — AB (ref 4.0–10.5)
nRBC: 64 /100 WBC — ABNORMAL HIGH

## 2013-07-07 LAB — RETICULOCYTES
RBC.: 1.81 MIL/uL — AB (ref 4.22–5.81)
Retic Ct Pct: >23 % — ABNORMAL HIGH (ref 0.4–3.1)

## 2013-07-07 LAB — MRSA PCR SCREENING: MRSA by PCR: NEGATIVE

## 2013-07-07 MED ORDER — METHADONE HCL 10 MG PO TABS
10.0000 mg | ORAL_TABLET | Freq: Every day | ORAL | Status: DC
  Administered 2013-07-07 – 2013-07-09 (×3): 10 mg via ORAL
  Filled 2013-07-07 (×3): qty 1

## 2013-07-07 MED ORDER — SODIUM POLYSTYRENE SULFONATE 15 GM/60ML PO SUSP
30.0000 g | Freq: Once | ORAL | Status: AC
  Administered 2013-07-07: 30 g via ORAL
  Filled 2013-07-07: qty 120

## 2013-07-07 MED ORDER — HYDROMORPHONE 0.3 MG/ML IV SOLN
INTRAVENOUS | Status: DC
  Administered 2013-07-07: 19:00:00 via INTRAVENOUS
  Administered 2013-07-07: 2 mg via INTRAVENOUS
  Administered 2013-07-07: 2.36 mg via INTRAVENOUS
  Administered 2013-07-08: 1.99 mg via INTRAVENOUS
  Administered 2013-07-08: 7.49 mg via INTRAVENOUS
  Administered 2013-07-08: 2.7 mg via INTRAVENOUS
  Administered 2013-07-08: 1.5 mg via INTRAVENOUS
  Administered 2013-07-08: 17:00:00 via INTRAVENOUS
  Administered 2013-07-08: 2.14 mg via INTRAVENOUS
  Administered 2013-07-09: 3.99 mg via INTRAVENOUS
  Administered 2013-07-09: 5.64 mg via INTRAVENOUS
  Administered 2013-07-09: 05:00:00 via INTRAVENOUS
  Administered 2013-07-09: 5.6 mg via INTRAVENOUS
  Administered 2013-07-09 – 2013-07-10 (×3): via INTRAVENOUS
  Administered 2013-07-10: 0.99 mg via INTRAVENOUS
  Filled 2013-07-07 (×8): qty 25

## 2013-07-07 MED ORDER — FOLIC ACID 1 MG PO TABS
1.0000 mg | ORAL_TABLET | Freq: Every day | ORAL | Status: DC
  Administered 2013-07-07 – 2013-07-10 (×4): 1 mg via ORAL
  Filled 2013-07-07 (×5): qty 1

## 2013-07-07 NOTE — Care Management Note (Signed)
    Page 1 of 1   07/07/2013     12:12:31 PM CARE MANAGEMENT NOTE 07/07/2013  Patient:  [name], [name]   Account Number:  [id number]  Date Initiated:  07/07/2013  Documentation initiated by:  Lanier Clam  Subjective/Objective Assessment:   43 Y/O M ADMITTED W/SSC.     Action/Plan:   FROM HOME.HAS PCP,PHARMACY.   Anticipated DC Date:  07/11/2013   Anticipated DC Plan:  HOME/SELF CARE      DC Planning Services  CM consult      Choice offered to / List presented to:             Status of service:  In process, will continue to follow Medicare Important Message given?   (If response is "NO", the following Medicare IM given date fields will be blank) Date Medicare IM given:   Date Additional Medicare IM given:    Discharge Disposition:    Per UR Regulation:  Reviewed for med. necessity/level of care/duration of stay  If discussed at Long Length of Stay Meetings, dates discussed:    Comments:  07/07/13 Jozelynn Danielson RN,BSN NCM [phone] NO ANTICIPATED D/C NEEDS.

## 2013-07-07 NOTE — ED Provider Notes (Signed)
Medical screening examination/treatment/procedure(s) were performed by non-physician practitioner and as supervising physician I was immediately available for consultation/collaboration.   EKG Interpretation None        Mahdiya Mossberg, MD 07/07/13 0858 

## 2013-07-07 NOTE — Progress Notes (Signed)
Pt refusing Toradol, stated its not good for his kidneys.

## 2013-07-07 NOTE — Progress Notes (Signed)
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 Pain is now at 5-6/10 on Dilaudid PCA and Toradol. He has been on full dose PCA which is not helping him much.  Objective: Vital signs in last 24 hours: Temp:  [97.9 F (36.6 C)-99.6 F (37.6 C)] 97.9 F (36.6 C) (06/04 0606) Pulse Rate:  [72-91] 72 (06/04 0606) Resp:  [15-21] 19 (06/04 0606) BP: (109-147)/(58-75) 109/66 mmHg (06/04 0606) SpO2:  [82 %-100 %] 96 % (06/04 0606) Weight:  [79.9 kg (176 lb 2.4 oz)-80.5 kg (177 lb 7.5 oz)] 80.5 kg (177 lb 7.5 oz) (06/04 0606) Weight change:  Last BM Date: 07/06/13  Intake/Output from previous day: 06/03 0701 - 06/04 0700 In: 321.7 [Blood:321.7] Out: 375 [Urine:375] Intake/Output this shift:    General appearance: alert, appears stated age and no distress Eyes: conjunctivae/corneas clear. PERRL, EOM's intact. Fundi benign. Ears: normal TM's and external ear canals both ears Neck: no adenopathy, no carotid bruit, no JVD, supple, symmetrical, trachea midline and thyroid not enlarged, symmetric, no tenderness/mass/nodules Back: symmetric, no curvature. ROM normal. No CVA tenderness. Resp: clear to auscultation bilaterally Chest wall: no tenderness Cardio: regular rate and rhythm, S1, S2 normal, no murmur, click, rub or gallop GI: soft, non-tender; bowel sounds normal; no masses,  no organomegaly Extremities: extremities normal, atraumatic, no cyanosis or edema Pulses: 2+ and symmetric Skin: Skin color, texture, turgor normal. No rashes or lesions Neurologic: Grossly normal  Lab Results:  Recent Labs  07/06/13 1755 07/07/13 0339  WBC 18.9* 16.9*  HGB 6.4* 6.3*  HCT 18.7* 18.0*  PLT 329 227   BMET  Recent Labs  07/06/13 1755 07/07/13 0339  NA 141 141  K 5.2 6.2*  CL 111 113*  CO2 19 20  GLUCOSE 118* 96  BUN 20 20  CREATININE 0.90 1.04  CALCIUM 8.5 8.0*    Studies/Results: Dg Chest 2 View  07/06/2013   CLINICAL DATA:  Sickle cell  disease.  Chest pain.  EXAM: CHEST  2 VIEW  COMPARISON:  PA and lateral chest 02/09/2013.  CT chest 02/10/2013.  FINDINGS: There is cardiomegaly and vascular congestion. Very small right pleural effusion is unchanged. There is no left pleural effusion or pneumothorax.  IMPRESSION: No acute finding.  Cardiomegaly.  Very small right pleural effusion, unchanged.   Electronically Signed   By: Drusilla Kanner M.D.   On: 07/06/2013 18:38    Medications: I have reviewed the patient's current medications.  Assessment/Plan: A 43 yo man admitted with sickle cell painful crisis.  #1 Sickle Cell Painful Crisis: Continue pain management with Dilaudid PCA as well as Toradol and  Supportive care.  #2 Sickle Cell Anemia: H/H stable.   #3 Hyperkalemia: Will give kayexalate and continue monitoring.  #4 Dehydration: Hydrated.  #5 Pleural effusion: Mild, continue to monitor.  LOS: 1 day   Rometta Emery 07/07/2013, 7:58 AM

## 2013-07-08 LAB — CBC WITH DIFFERENTIAL/PLATELET
BAND NEUTROPHILS: 0 % (ref 0–10)
BASOS ABS: 0 10*3/uL (ref 0.0–0.1)
BASOS PCT: 0 % (ref 0–1)
BLASTS: 0 %
Eosinophils Absolute: 0.3 10*3/uL (ref 0.0–0.7)
Eosinophils Relative: 2 % (ref 0–5)
HCT: 16.1 % — ABNORMAL LOW (ref 39.0–52.0)
HEMOGLOBIN: 5.8 g/dL — AB (ref 13.0–17.0)
LYMPHS ABS: 4.7 10*3/uL — AB (ref 0.7–4.0)
LYMPHS PCT: 36 % (ref 12–46)
MCH: 34.5 pg — AB (ref 26.0–34.0)
MCHC: 36 g/dL (ref 30.0–36.0)
MCV: 95.8 fL (ref 78.0–100.0)
METAMYELOCYTES PCT: 0 %
MYELOCYTES: 0 %
Monocytes Absolute: 0.7 10*3/uL (ref 0.1–1.0)
Monocytes Relative: 5 % (ref 3–12)
Neutro Abs: 7.3 10*3/uL (ref 1.7–7.7)
Neutrophils Relative %: 57 % (ref 43–77)
PROMYELOCYTES ABS: 0 %
Platelets: 169 10*3/uL (ref 150–400)
RBC: 1.68 MIL/uL — ABNORMAL LOW (ref 4.22–5.81)
RDW: 30.4 % — AB (ref 11.5–15.5)
WBC: 13 10*3/uL — ABNORMAL HIGH (ref 4.0–10.5)
nRBC: 39 /100 WBC — ABNORMAL HIGH

## 2013-07-08 LAB — COMPREHENSIVE METABOLIC PANEL
ALBUMIN: 2.8 g/dL — AB (ref 3.5–5.2)
ALK PHOS: 122 U/L — AB (ref 39–117)
ALT: 44 U/L (ref 0–53)
AST: 102 U/L — ABNORMAL HIGH (ref 0–37)
BUN: 20 mg/dL (ref 6–23)
CO2: 19 mEq/L (ref 19–32)
Calcium: 8.2 mg/dL — ABNORMAL LOW (ref 8.4–10.5)
Chloride: 115 mEq/L — ABNORMAL HIGH (ref 96–112)
Creatinine, Ser: 0.93 mg/dL (ref 0.50–1.35)
GFR calc Af Amer: >90 mL/min (ref >90)
GFR calc non Af Amer: >90 mL/min (ref >90)
GLUCOSE: 101 mg/dL — AB (ref 70–99)
POTASSIUM: 6.6 meq/L — AB (ref 3.7–5.3)
SODIUM: 144 meq/L (ref 137–147)
Total Bilirubin: 5.9 mg/dL — ABNORMAL HIGH (ref 0.3–1.2)
Total Protein: 6.9 g/dL (ref 6.0–8.3)

## 2013-07-08 LAB — CBC
HCT: 18.3 % — ABNORMAL LOW (ref 39.0–52.0)
Hemoglobin: 6.6 g/dL — CL (ref 13.0–17.0)
MCH: 33.3 pg (ref 26.0–34.0)
MCHC: 36.1 g/dL — ABNORMAL HIGH (ref 30.0–36.0)
MCV: 92.4 fL (ref 78.0–100.0)
PLATELETS: 211 10*3/uL (ref 150–400)
RBC: 1.98 MIL/uL — AB (ref 4.22–5.81)
RDW: 26.9 % — ABNORMAL HIGH (ref 11.5–15.5)
WBC: 13 10*3/uL — ABNORMAL HIGH (ref 4.0–10.5)

## 2013-07-08 LAB — PREPARE RBC (CROSSMATCH)

## 2013-07-08 MED ORDER — SODIUM POLYSTYRENE SULFONATE 15 GM/60ML PO SUSP
30.0000 g | Freq: Once | ORAL | Status: AC
  Administered 2013-07-08: 30 g via ORAL
  Filled 2013-07-08: qty 120

## 2013-07-08 NOTE — Progress Notes (Signed)
CRITICAL VALUE ALERT  Critical value received:  k 6.6  Date of notification:  07/08/2013  Time of notification:  0433  Critical value read back:yes  Nurse who received alert:  Charolotte Capuchin   MD notified (1st page):  Merdis Delay, NP  Time of first page:  [phone]   MD notified (2nd page):  Time of second page:  Responding MD:  Merdis Delay, NP   Time MD responded:  [phone]   Received orders to give one time dose of kayexalate 30mg . Will continue to monitor pt

## 2013-07-08 NOTE — Progress Notes (Signed)
Subjective: Patient is doing a little better. His Dilaudid PC has been changed to custom dose in his level of pain is down to 6/10. He is able to get out of bed and move around. His biggest problem at this point is not complaining of his high potassium. He only has had that issue in the past whenever he had crisis. He denied any fever, no nausea vomiting or diarrhea. He denied any shortness of breath.  Objective: Vital signs in last 24 hours: Temp:  [98.2 F (36.8 C)-98.9 F (37.2 C)] 98.8 F (37.1 C) (06/05 2207) Pulse Rate:  [69-86] 69 (06/05 2207) Resp:  [18-20] 20 (06/05 2207) BP: (103-125)/(46-67) 118/57 mmHg (06/05 2207) SpO2:  [91 %-97 %] 97 % (06/05 2207) Weight:  [80.9 kg (178 lb 5.6 oz)] 80.9 kg (178 lb 5.6 oz) (06/05 0507) Weight change: 1 kg (2 lb 3.3 oz) Last BM Date: 07/08/13  Intake/Output from previous day: 06/04 0701 - 06/05 0700 In: 600 [P.O.:600] Out: 2350 [Urine:2350] Intake/Output this shift: Total I/O In: -  Out: 475 [Urine:475]  General appearance: alert, cooperative and no distress Eyes: conjunctivae/corneas clear. PERRL, EOM's intact. Fundi benign. Neck: no adenopathy, no carotid bruit, no JVD, supple, symmetrical, trachea midline and thyroid not enlarged, symmetric, no tenderness/mass/nodules Back: symmetric, no curvature. ROM normal. No CVA tenderness. Resp: clear to auscultation bilaterally Chest wall: no tenderness Cardio: regular rate and rhythm, S1, S2 normal, no murmur, click, rub or gallop GI: soft, non-tender; bowel sounds normal; no masses,  no organomegaly Extremities: extremities normal, atraumatic, no cyanosis or edema Pulses: 2+ and symmetric Skin: Skin color, texture, turgor normal. No rashes or lesions Neurologic: Grossly normal  Lab Results:  Recent Labs  07/08/13 0348 07/08/13 1348  WBC 13.0* 13.0*  HGB 5.8* 6.6*  HCT 16.1* 18.3*  PLT 169 211   BMET  Recent Labs  07/07/13 1220 07/08/13 0348  NA 142 144  K 6.4* 6.6*   CL 114* 115*  CO2 21 19  GLUCOSE 93 101*  BUN 22 20  CREATININE 1.04 0.93  CALCIUM 8.1* 8.2*    Studies/Results: No results found.  Medications: I have reviewed the patient's current medications.  Assessment/Plan: A 43 yo man admitted with sickle cell painful crisis.   #1 Sickle Cell Painful Crisis: Patient is using his blood PCA adequately. We'll maintain him on the same dose and start titrating it down from tomorrow for   #2 Sickle Cell Anemia: Hemoglobin stays stable after transfusion of one pack red blood cell continue to monitor his hemoglobin to   #3 Hyperkalemia: Serum potassium persistently high. We will continue kayexalate and continue monitoring.   #4 Dehydration: Hydrated. Continue IV fluids as needed  #5 Pleural effusion: Mild, continue to monitor. No shortness of breath at this point  #6 leukocytosis: Secondary to sickle cell crisis   LOS: 2 days   Rometta Emery 07/08/2013, 10:36 PM

## 2013-07-08 NOTE — Clinical Documentation Improvement (Signed)
Possible Clinical Conditions? Chronic Systolic Congestive Heart Failure Chronic Diastolic Congestive Heart Failure Chronic Systolic & Diastolic Congestive Heart Failure Acute Systolic Congestive Heart Failure Acute Diastolic Congestive Heart Failure Acute Systolic & Diastolic Congestive Heart Failure Acute on Chronic Systolic Congestive Heart Failure Acute on Chronic Diastolic Congestive Heart Failure Acute on Chronic Systolic & Diastolic Congestive Heart Failure Other Condition Cannot Clinically Determine  Supporting Information: Risk Factors:(As per notes) "Sickle Cell Crisis" Signs & Symptoms:(As per notes) "Patient is here with sickle cell crisis, weakness, feels like he may be transfusion. We'll start him on IV fluids, 1 L ordered at this time, patient does have history of congestive heart failure."  Diagnostics: 07-06-13 CXR " Small pleural effusion"  Thank You, Nevin Bloodgood, RN, BSN, CCDS, Clinical Documentation Specialist:  [id number]   [phone]=[facility] [place]- [facility] Information Management

## 2013-07-08 NOTE — Progress Notes (Signed)
Patient's potassium early this morning was 6.6. Patient given kayexalate this morning and the potassium had not been rechecked since then. Oncall notified and new orders were given to get blood work to check potassium. Patient then refused to be stuck tonight. He stated he understood why it should be checked tonight, but doesn't want to be restuck in the morning. Will continue to monitor.

## 2013-07-08 NOTE — Progress Notes (Signed)
Paged on call NP in regards to patient's hbg dropping to 5.8. New orders placed for 1 unit RBC.

## 2013-07-09 DIAGNOSIS — E875 Hyperkalemia: Secondary | ICD-10-CM | POA: Diagnosis present

## 2013-07-09 LAB — LACTATE DEHYDROGENASE: LDH: 848 U/L — ABNORMAL HIGH (ref 94–250)

## 2013-07-09 LAB — BASIC METABOLIC PANEL
BUN: 14 mg/dL (ref 6–23)
CALCIUM: 8.3 mg/dL — AB (ref 8.4–10.5)
CO2: 19 meq/L (ref 19–32)
Chloride: 115 mEq/L — ABNORMAL HIGH (ref 96–112)
Creatinine, Ser: 0.86 mg/dL (ref 0.50–1.35)
GFR calc Af Amer: >90 mL/min (ref >90)
GFR calc non Af Amer: >90 mL/min (ref >90)
GLUCOSE: 96 mg/dL (ref 70–99)
Potassium: 6.1 mEq/L — ABNORMAL HIGH (ref 3.7–5.3)
Sodium: 143 mEq/L (ref 137–147)

## 2013-07-09 LAB — COMPREHENSIVE METABOLIC PANEL WITH GFR
ALT: 34 U/L (ref 0–53)
AST: 82 U/L — ABNORMAL HIGH (ref 0–37)
Albumin: 2.7 g/dL — ABNORMAL LOW (ref 3.5–5.2)
Alkaline Phosphatase: 111 U/L (ref 39–117)
BUN: 15 mg/dL (ref 6–23)
CO2: 19 meq/L (ref 19–32)
Calcium: 8.3 mg/dL — ABNORMAL LOW (ref 8.4–10.5)
Chloride: 117 meq/L — ABNORMAL HIGH (ref 96–112)
Creatinine, Ser: 0.89 mg/dL (ref 0.50–1.35)
GFR calc Af Amer: >90 mL/min
GFR calc non Af Amer: >90 mL/min
Glucose, Bld: 90 mg/dL (ref 70–99)
Potassium: 6.4 meq/L — ABNORMAL HIGH (ref 3.7–5.3)
Sodium: 143 meq/L (ref 137–147)
Total Bilirubin: 3.6 mg/dL — ABNORMAL HIGH (ref 0.3–1.2)
Total Protein: 6.7 g/dL (ref 6.0–8.3)

## 2013-07-09 LAB — CBC WITH DIFFERENTIAL/PLATELET
BASOS PCT: 1 % (ref 0–1)
Basophils Absolute: 0.1 10*3/uL (ref 0.0–0.1)
EOS ABS: 0.2 10*3/uL (ref 0.0–0.7)
Eosinophils Relative: 2 % (ref 0–5)
HCT: 20.3 % — ABNORMAL LOW (ref 39.0–52.0)
HEMOGLOBIN: 7.2 g/dL — AB (ref 13.0–17.0)
Lymphocytes Relative: 45 % (ref 12–46)
Lymphs Abs: 5.6 10*3/uL — ABNORMAL HIGH (ref 0.7–4.0)
MCH: 32.9 pg (ref 26.0–34.0)
MCHC: 35.5 g/dL (ref 30.0–36.0)
MCV: 92.7 fL (ref 78.0–100.0)
Monocytes Absolute: 1 10*3/uL (ref 0.1–1.0)
Monocytes Relative: 8 % (ref 3–12)
NEUTROS ABS: 5.4 10*3/uL (ref 1.7–7.7)
Neutrophils Relative %: 44 % (ref 43–77)
Platelets: 238 10*3/uL (ref 150–400)
RBC: 2.19 MIL/uL — ABNORMAL LOW (ref 4.22–5.81)
RDW: 26.4 % — ABNORMAL HIGH (ref 11.5–15.5)
WBC: 12.3 10*3/uL — ABNORMAL HIGH (ref 4.0–10.5)

## 2013-07-09 LAB — CK: Total CK: 25 U/L (ref 7–232)

## 2013-07-09 MED ORDER — INSULIN ASPART 100 UNIT/ML ~~LOC~~ SOLN: 10.0000 [IU] | Freq: Once | SUBCUTANEOUS | Status: DC

## 2013-07-09 MED ORDER — INSULIN ASPART 100 UNIT/ML IV SOLN
10.0000 [IU] | Freq: Once | INTRAVENOUS | Status: AC
  Administered 2013-07-09: 10 [IU] via INTRAVENOUS
  Filled 2013-07-09: qty 0.1

## 2013-07-09 MED ORDER — SODIUM POLYSTYRENE SULFONATE 15 GM/60ML PO SUSP
15.0000 g | Freq: Two times a day (BID) | ORAL | Status: DC
  Administered 2013-07-09 – 2013-07-10 (×2): 15 g via ORAL
  Filled 2013-07-09 (×4): qty 60

## 2013-07-09 MED ORDER — SODIUM CHLORIDE 0.9 % IV SOLN
1.0000 g | Freq: Once | INTRAVENOUS | Status: AC
  Administered 2013-07-09: 1 g via INTRAVENOUS
  Filled 2013-07-09: qty 10

## 2013-07-09 MED ORDER — DEXTROSE 50 % IV SOLN
25.0000 mL | Freq: Once | INTRAVENOUS | Status: AC
  Administered 2013-07-09: 25 mL via INTRAVENOUS
  Filled 2013-07-09: qty 50

## 2013-07-09 NOTE — Progress Notes (Signed)
Subjective: Patient has consistently refused to get better.  He has adequate pain control her potassium has been high. He is eating and drinking and doing to his activity without problem. No shortness of breath, no cough, no nausea vomiting or diarrhea.  Objective: Vital signs in last 24 hours: Temp:  [97.8 F (36.6 C)-99 F (37.2 C)] 98.4 F (36.9 C) (06/06 1323) Pulse Rate:  [61-71] 71 (06/06 1323) Resp:  [15-23] 16 (06/06 1323) BP: (116-127)/(57-70) 127/67 mmHg (06/06 1323) SpO2:  [93 %-98 %] 98 % (06/06 1323) Weight:  [82.4 kg (181 lb 10.5 oz)] 82.4 kg (181 lb 10.5 oz) (06/06 0505) Weight change: 1.5 kg (3 lb 4.9 oz) Last BM Date: 07/08/13  Intake/Output from previous day: 06/05 0701 - 06/06 0700 In: 6598.3 [P.O.:840; I.V.:5758.3] Out: 1525 [Urine:1525] Intake/Output this shift: Total I/O In: 360 [P.O.:360] Out: 1875 [Urine:1875]  General appearance: alert, appears stated age and no distress Eyes: conjunctivae/corneas clear. PERRL, EOM's intact. Fundi benign. Ears: normal TM's and external ear canals both ears Neck: no adenopathy, no carotid bruit, no JVD, supple, symmetrical, trachea midline and thyroid not enlarged, symmetric, no tenderness/mass/nodules Back: symmetric, no curvature. ROM normal. No CVA tenderness. Resp: clear to auscultation bilaterally Chest wall: no tenderness Cardio: regular rate and rhythm, S1, S2 normal, no murmur, click, rub or gallop GI: soft, non-tender; bowel sounds normal; no masses,  no organomegaly Extremities: extremities normal, atraumatic, no cyanosis or edema Pulses: 2+ and symmetric Skin: Skin color, texture, turgor normal. No rashes or lesions Neurologic: Grossly normal  Lab Results:  Recent Labs  07/08/13 1348 07/09/13 0506  WBC 13.0* 12.3*  HGB 6.6* 7.2*  HCT 18.3* 20.3*  PLT 211 238   BMET  Recent Labs  07/09/13 0506 07/09/13 1145  NA 143 143  K 6.4* 6.1*  CL 117* 115*  CO2 19 19  GLUCOSE 90 96  BUN 15 14   CREATININE 0.89 0.86  CALCIUM 8.3* 8.3*    Studies/Results: No results found.  Medications: I have reviewed the patient's current medications.  Assessment/Plan: A 43 yo man admitted with sickle cell painful crisis.  #1 Sickle Cell Painful Crisis: Patient will continue his PCA. Also complete his Toradol and other medications  #2 Sickle Cell Anemia: Hemoglobin remained stable after transfusion. Continue to monitor her   #3 Hyperkalemia: Potassium level has improved with Kayexalate but still high. Collection the Kayexalate dose to twice a day  #4 Dehydration: Hydrated. Decreased IV fluid  #5 Pleural effusion: Mild, continue to monitor.  #6 leukocytosis: Due to his sickle cell crisis. This is also improving.  LOS: 3 days   Rometta Emery 07/09/2013, 3:23 PM

## 2013-07-09 NOTE — Progress Notes (Signed)
Pt have used 17.2 mg Dilaudid PCA for the last 24hrs.

## 2013-07-09 NOTE — Progress Notes (Signed)
Potassium this morning was 6.4, on call notified and new orders were given. Will continue to monitor.

## 2013-07-09 NOTE — Plan of Care (Cosign Needed)
K+ has remained > 6.0 since admission- suspect related to acute sickle cell crisis- note IVFs only at 100/hr so will increase to 200/hr-since most recent K is 6.4 will hydrate as above and give IV insulin/D50 and ca gluconate-was transfused 1 unit PRBCs at admt and hgb is stable-if K remains elevated after adequate hydration consider Kayexalate  Junious Silk, ANP

## 2013-07-10 DIAGNOSIS — E875 Hyperkalemia: Secondary | ICD-10-CM

## 2013-07-10 LAB — CBC WITH DIFFERENTIAL/PLATELET
Basophils Absolute: 0.1 10*3/uL (ref 0.0–0.1)
Basophils Relative: 1 % (ref 0–1)
Eosinophils Absolute: 0.8 10*3/uL — ABNORMAL HIGH (ref 0.0–0.7)
Eosinophils Relative: 6 % — ABNORMAL HIGH (ref 0–5)
HEMATOCRIT: 19.2 % — AB (ref 39.0–52.0)
Hemoglobin: 6.7 g/dL — CL (ref 13.0–17.0)
LYMPHS PCT: 28 % (ref 12–46)
Lymphs Abs: 3.6 10*3/uL (ref 0.7–4.0)
MCH: 32.8 pg (ref 26.0–34.0)
MCHC: 34.9 g/dL (ref 30.0–36.0)
MCV: 94.1 fL (ref 78.0–100.0)
MONOS PCT: 11 % (ref 3–12)
Monocytes Absolute: 1.4 10*3/uL — ABNORMAL HIGH (ref 0.1–1.0)
NEUTROS ABS: 7 10*3/uL (ref 1.7–7.7)
Neutrophils Relative %: 54 % (ref 43–77)
Platelets: 201 10*3/uL (ref 150–400)
RBC: 2.04 MIL/uL — ABNORMAL LOW (ref 4.22–5.81)
RDW: 24.8 % — AB (ref 11.5–15.5)
WBC: 12.9 10*3/uL — AB (ref 4.0–10.5)

## 2013-07-10 LAB — TYPE AND SCREEN
ABO/RH(D): A POS
Antibody Screen: NEGATIVE
Unit division: 0
Unit division: 0

## 2013-07-10 LAB — COMPREHENSIVE METABOLIC PANEL
ALT: 30 U/L (ref 0–53)
AST: 81 U/L — ABNORMAL HIGH (ref 0–37)
Albumin: 2.8 g/dL — ABNORMAL LOW (ref 3.5–5.2)
Alkaline Phosphatase: 113 U/L (ref 39–117)
BUN: 13 mg/dL (ref 6–23)
CO2: 18 mEq/L — ABNORMAL LOW (ref 19–32)
Calcium: 8.4 mg/dL (ref 8.4–10.5)
Chloride: 115 mEq/L — ABNORMAL HIGH (ref 96–112)
Creatinine, Ser: 0.8 mg/dL (ref 0.50–1.35)
GFR calc non Af Amer: >90 mL/min (ref >90)
GLUCOSE: 100 mg/dL — AB (ref 70–99)
POTASSIUM: 6 meq/L — AB (ref 3.7–5.3)
Sodium: 142 mEq/L (ref 137–147)
Total Bilirubin: 3.8 mg/dL — ABNORMAL HIGH (ref 0.3–1.2)
Total Protein: 6.9 g/dL (ref 6.0–8.3)

## 2013-07-10 LAB — CBC
HCT: 21.8 % — ABNORMAL LOW (ref 39.0–52.0)
HEMOGLOBIN: 7.5 g/dL — AB (ref 13.0–17.0)
MCH: 31.8 pg (ref 26.0–34.0)
MCHC: 34.4 g/dL (ref 30.0–36.0)
MCV: 92.4 fL (ref 78.0–100.0)
Platelets: 235 10*3/uL (ref 150–400)
RBC: 2.36 MIL/uL — ABNORMAL LOW (ref 4.22–5.81)
RDW: 22.9 % — ABNORMAL HIGH (ref 11.5–15.5)
WBC: 12.1 10*3/uL — AB (ref 4.0–10.5)

## 2013-07-10 LAB — PREPARE RBC (CROSSMATCH)

## 2013-07-10 MED ORDER — SODIUM POLYSTYRENE SULFONATE 15 GM/60ML PO SUSP: 30.0000 g | Freq: Two times a day (BID) | ORAL | Status: DC

## 2013-07-10 NOTE — Progress Notes (Signed)
CRITICAL VALUE ALERT  Critical value received:  Hgb 6.7  Date of notification:  07/10/13  Time of notification:  0550  Critical value read back:yes  Nurse who received alert:  Linward Natal  MD notified (1st page):  Rennis Harding  Time of first page:  [phone]  MD notified (2nd page):  Time of second page:  Responding MD:  Rennis Harding  Time MD responded:  [email or web address]  NP ordered 1 unit of blood to be transfused

## 2013-07-10 NOTE — Plan of Care (Cosign Needed)
Hgb drifting down- pt still requiring oxygen-platelets appear lower than normal- ? Evolving acute chest syndrome- will transfuse at least 1 unit PRBCs for now   Junious Silk, ANP

## 2013-07-10 NOTE — Discharge Summary (Addendum)
Physician Discharge Summary  Patient ID: [name] MRN: [id number] DOB/AGE: [birth year] 43 y.o.  Admit date: 07/06/2013 Discharge date: 07/10/2013  Admission Diagnoses:  Discharge Diagnoses:  Principal Problem:   Sickle cell crisis Active Problems:   Sickle cell anemia   Dehydration   Hyperkalemia   Discharged Condition: good  Hospital Course: Patient is a 43 year old gentleman admitted to 2 sickle cell crises. He was having severe pain rated as 10 out of 10 at the time of admission. He also had some mild hemolytic crisis. In the course of his hospitalization he developed severe hyperkalemia. He is prone to hyperkalemia from the hemolytic crisis on release of potassium from his red blood cells. He was treated with IV Dilaudid PCA custom nose and with Toradol IV hydration. Patient's pain responded well. His crisis use of one unit of packed red blood cells. His also been getting Kayexalate for his hyperkalemia. At this point his potassium is still at 6.0 but patient wanted to go home since his crisis sulfa. We are discharging him home with Kayexalate 30 mg twice a day. He will come back on Tuesday and have his potassium level checked in the clinic. He has no other complaints at this point.  Consults: None  Significant Diagnostic Studies: labs: Serial CBCs and CMP patient has consistently elevated potassium of  Treatments: IV hydration, analgesia: Dilaudid and blood transfusion  Discharge Exam: Blood pressure 119/77, pulse 68, temperature 98.3 F (36.8 C), temperature source Oral, resp. rate 20, height 5\' 10"  (1.778 m), weight 82.6 kg (182 lb 1.6 oz), SpO2 95.00%. General appearance: alert, cooperative and no distress Eyes: conjunctivae/corneas clear. PERRL, EOM's intact. Fundi benign. Neck: no adenopathy, no carotid bruit, no JVD, supple, symmetrical, trachea midline and thyroid not enlarged, symmetric, no tenderness/mass/nodules Back: symmetric, no curvature. ROM normal. No CVA  tenderness. Resp: clear to auscultation bilaterally Cardio: regular rate and rhythm, S1, S2 normal, no murmur, click, rub or gallop GI: soft, non-tender; bowel sounds normal; no masses,  no organomegaly Extremities: extremities normal, atraumatic, no cyanosis or edema Pulses: 2+ and symmetric Skin: Skin color, texture, turgor normal. No rashes or lesions Neurologic: Grossly normal  Disposition: 01-Home or Self Care     Medication List         ALPRAZolam 0.25 MG tablet  Commonly known as:  XANAX  Take 1 tablet (0.25 mg total) by mouth 3 (three) times daily as needed for anxiety.     folic acid 1 MG tablet  Commonly known as:  FOLVITE  Take 1 tablet (1 mg total) by mouth daily.     HYDROmorphone 4 MG tablet  Commonly known as:  DILAUDID  Take 1 tablet (4 mg total) by mouth every 4 (four) hours as needed for severe pain.     hydroxyurea 500 MG capsule  Commonly known as:  HYDREA  Take 2 capsules (1,000 mg total) by mouth daily. May take with food to minimize GI side effects.     ibuprofen 800 MG tablet  Commonly known as:  ADVIL,MOTRIN  Take 1 tablet (800 mg total) by mouth every 8 (eight) hours as needed for pain.     methadone 10 MG tablet  Commonly known as:  DOLOPHINE  Take 1 tablet (10 mg total) by mouth at bedtime.     metoprolol 50 MG tablet  Commonly known as:  LOPRESSOR  Take 1 tablet (50 mg total) by mouth 2 (two) times daily.     sodium polystyrene 15 GM/60ML suspension  Commonly known  as:  KAYEXALATE  Take 120 mLs (30 g total) by mouth 2 (two) times daily.     zolpidem 10 MG tablet  Commonly known as:  AMBIEN  take 1 tablet by mouth at bedtime if needed for sleep         Signed: Jerrian Mells L Chardai Gangemi 07/10/2013, 1:04 PM  Time spent 37 minutes

## 2013-07-10 NOTE — Progress Notes (Signed)
Patient's blood consent and type and screen had expired. RN put in order for type and screen and also got blood consent from patient. Patient is willing to get blood drawn again and willing to get the unit of blood, but he states he wants to go home today.

## 2013-07-11 LAB — TYPE AND SCREEN
ABO/RH(D): A POS
Antibody Screen: NEGATIVE
Unit division: 0

## 2013-07-26 ENCOUNTER — Telehealth: Payer: Self-pay

## 2013-07-26 NOTE — Telephone Encounter (Signed)
Pt contacted office today stating that he has started to have swelling in Bilat feet. Pt states he is not in any pain at this time .Pt also states that his foot swelling is an on again off again issue. Pt states he is not on any Diuretics at this time.Pt was offered an appointment to be seen this week,Pt states he could wait till next week. Pt is on Sch. To be seen by NP C. Hollis on 08/02/2013@ 4PM

## 2013-07-26 NOTE — Telephone Encounter (Signed)
Patient called to report bilateral lower extremity edema. Due to patient's history, patient will need to be place on the schedule as soon as possible.

## 2013-08-02 ENCOUNTER — Encounter: Payer: Self-pay | Admitting: Family Medicine

## 2013-08-02 ENCOUNTER — Ambulatory Visit (INDEPENDENT_AMBULATORY_CARE_PROVIDER_SITE_OTHER): Payer: BC Managed Care – PPO | Admitting: Family Medicine

## 2013-08-02 VITALS — BP 107/52 | HR 80 | Temp 98.4°F | Resp 20 | Wt 171.0 lb

## 2013-08-02 DIAGNOSIS — R000 Tachycardia, unspecified: Secondary | ICD-10-CM

## 2013-08-02 DIAGNOSIS — R600 Localized edema: Secondary | ICD-10-CM

## 2013-08-02 DIAGNOSIS — Z8639 Personal history of other endocrine, nutritional and metabolic disease: Secondary | ICD-10-CM

## 2013-08-02 DIAGNOSIS — Z8739 Personal history of other diseases of the musculoskeletal system and connective tissue: Secondary | ICD-10-CM

## 2013-08-02 DIAGNOSIS — R609 Edema, unspecified: Secondary | ICD-10-CM

## 2013-08-02 DIAGNOSIS — Z8679 Personal history of other diseases of the circulatory system: Secondary | ICD-10-CM

## 2013-08-02 DIAGNOSIS — Z862 Personal history of diseases of the blood and blood-forming organs and certain disorders involving the immune mechanism: Secondary | ICD-10-CM

## 2013-08-02 DIAGNOSIS — D571 Sickle-cell disease without crisis: Secondary | ICD-10-CM

## 2013-08-02 DIAGNOSIS — D5700 Hb-SS disease with crisis, unspecified: Secondary | ICD-10-CM

## 2013-08-02 LAB — CBC WITH DIFFERENTIAL/PLATELET
BASOS ABS: 0.2 10*3/uL — AB (ref 0.0–0.1)
BASOS PCT: 1 % (ref 0–1)
EOS ABS: 1 10*3/uL — AB (ref 0.0–0.7)
Eosinophils Relative: 6 % — ABNORMAL HIGH (ref 0–5)
HEMATOCRIT: 19.6 % — AB (ref 39.0–52.0)
Hemoglobin: 6.8 g/dL — CL (ref 13.0–17.0)
Lymphocytes Relative: 43 % (ref 12–46)
Lymphs Abs: 6.6 10*3/uL — ABNORMAL HIGH (ref 0.7–4.0)
MCH: 30.8 pg (ref 26.0–34.0)
MCHC: 34.2 g/dL (ref 30.0–36.0)
MCV: 88.7 fL (ref 78.0–100.0)
MONO ABS: 1.8 10*3/uL — AB (ref 0.1–1.0)
Monocytes Relative: 12 % (ref 3–12)
Neutro Abs: 5.9 10*3/uL (ref 1.7–7.7)
Neutrophils Relative %: 38 % — ABNORMAL LOW (ref 43–77)
Platelets: 504 10*3/uL — ABNORMAL HIGH (ref 150–400)
RBC: 2.21 MIL/uL — ABNORMAL LOW (ref 4.22–5.81)
RDW: 23.6 % — ABNORMAL HIGH (ref 11.5–15.5)
WBC: 15.4 10*3/uL — ABNORMAL HIGH (ref 4.0–10.5)

## 2013-08-02 MED ORDER — METOPROLOL TARTRATE 50 MG PO TABS: 50.0000 mg | ORAL_TABLET | Freq: Every day | ORAL | Status: DC

## 2013-08-02 MED ORDER — HYDROMORPHONE HCL 4 MG PO TABS: 4.0000 mg | ORAL_TABLET | ORAL | Status: DC | PRN

## 2013-08-02 NOTE — Progress Notes (Signed)
Subjective:    Patient ID: Rojas Rojas, male    DOB: 04/15/1970, 43 y.o.   MRN: [id number]  HPI Patient with a history of sickle cell anemia, HbSS is complaining of lower extremity swelling and pain. He reports that his feet have been swelling since previous hospital visit. Patient was discharged from the hospital on 07/10/2013. Patient reports that swelling primarily occurs at night. He states that he sits the majority of the time at his job and by the end of his work day the fluid pools in his feet.   Patient also complaining of pain to his lower extremities. States that pain intensity is 5/10, constant, aching, and localized to the lower extremities. He states that pain is moderately relieved by Dilaudid 4 mg last taken on last night. He states that he no longer takes hydrea, but does take folic acid daily. He also states that he has not taken Allopurinol in 4-5 months for history of gout. Maintains that he started drinking apple cider vinegar and water to decrease uric acid.   Shortness of breath during routine activity, such as climbing two flights of stairs tiredness Chest pain A racing heartbeat Pain on the upper right side of the abdomen.    Rojas Rojas also complaining of periodic heart palpitations. He describes heart palpitation as a slight fluttering. He reports that he is taking Metoprolol 50 mg daily. Reports that he decreased his dose to once daily. Patient denies dizziness, shortness of breath, headache, chest pain, nausea, or vomiting.    Review of Systems  Constitutional: Negative for fever, fatigue and unexpected weight change.  HENT: Negative.   Eyes: Negative.   Respiratory: Negative.   Cardiovascular: Positive for palpitations (periodic) and leg swelling (bilateral ankles).  Gastrointestinal: Negative.   Endocrine: Negative.   Genitourinary: Negative.   Musculoskeletal: Positive for myalgias.       Complaining of bilateral foot pain   Skin: Negative.    Allergic/Immunologic: Negative.   Neurological: Negative.  Negative for dizziness, tremors, facial asymmetry and weakness.  Hematological: Negative.   Psychiatric/Behavioral: Negative.  The patient is not nervous/anxious.        Objective:   Physical Exam  Constitutional: He is oriented to person, place, and time. He appears well-developed and well-nourished.  HENT:  Head: Normocephalic and atraumatic.  Right Ear: External ear normal.  Left Ear: External ear normal.  Nose: Nose normal.  Mouth/Throat: Oropharynx is clear and moist.  Eyes: Conjunctivae are normal. Pupils are equal, round, and reactive to light.  Neck: Normal range of motion. Neck supple.  Cardiovascular: Regular rhythm, intact distal pulses and normal pulses.   Murmur heard.  Systolic murmur is present with a grade of 2/6  Pulmonary/Chest: Effort normal and breath sounds normal.  Abdominal: Soft. Bowel sounds are normal.  Musculoskeletal: Normal range of motion.       Right ankle: He exhibits swelling. He exhibits normal range of motion.       Left ankle: He exhibits swelling. He exhibits normal range of motion.  Neurological: He is alert and oriented to person, place, and time.  Skin: Skin is warm and dry.  Psychiatric: He has a normal mood and affect. His behavior is normal. Judgment and thought content normal.      BP 107/52  Pulse 80  Temp(Src) 98.4 F (36.9 C) (Oral)  Resp 20  Wt 171 lb (77.565 kg) [facility]* [address] [place], [place] [address] [phone]  ------------------------------------------------------------ Transthoracic Echocardiography  Patient:  Rojas Rojas MR #: [id number] Study Date: 02/17/2012 Gender: M Age: 19 Height: 177.8cm Weight: 84.1kg BSA: 2.7951m^2 Pt. Status: Room: WA04  PERFORMING Shvc ATTENDING Marthann SchillerMatthews, Michelle ADMITTING Lamarr Lulashompson, Daniel ORDERING Thompson, Daniel REFERRING Thompson, Daniel SONOGRAPHER Nolon Rodony Brown,  RDCS cc:  ------------------------------------------------------------ LV EF: 55% - 60%  ------------------------------------------------------------ Indications: Supraventricular tachycardia 427.0.  ------------------------------------------------------------ History: Risk factors: Sickle Cell.  ------------------------------------------------------------ Study Conclusions  - Left ventricle: The cavity size was normal. There was moderate concentric hypertrophy. Systolic function was normal. The estimated ejection fraction was in the range of 55% to 60%. Left ventricular diastolic function parameters were normal. - Mitral valve: Mild regurgitation - 2 eccentric jets. There is filamentous material that moves with the posterior mitral leaflet which may represent a ruptured or flail cord. Consider TEE for better visualization. - Left atrium: Moderately dilated (area 25.7 cm2) - Right ventricle: The cavity size was mildly dilated. Wall thickness was normal. The moderator band was prominent. Systolic function was normal. - Right atrium: Moderately dilated (26.3 cm2). - Atrial septum: No defect or patent foramen ovale was identified. - Inferior vena cava: The vessel was dilated; the respirophasic diameter changes were blunted (< 50%); findings are consistent with elevated central venous pressure. - Pericardium, extracardiac: There was no pericardial effusion. Transthoracic echocardiography. M-mode, complete 2D, spectral Doppler, and color Doppler. Height: Height: 177.8cm. Height: 70in. Weight: Weight: 84.1kg. Weight: 185lb. Body mass index: BMI: 26.6kg/m^2. Body surface area: BSA: 2.8451m^2. Blood pressure: 108/60. Patient status: Inpatient. Location: ICU/CCU  ------------------------------------------------------------  ------------------------------------------------------------ Left ventricle: The cavity size was normal. There was moderate concentric hypertrophy. Systolic  function was normal. The estimated ejection fraction was in the range of 55% to 60%. The transmitral flow pattern was normal. The deceleration time of the early transmitral flow velocity was normal. The pulmonary vein flow pattern was normal. The tissue Doppler parameters were normal. Left ventricular diastolic function parameters were normal.  ------------------------------------------------------------ Aortic valve: Structurally normal valve. Trileaflet. Cusp separation was normal. Doppler: Transvalvular velocity was within the normal range. There was no stenosis. No regurgitation.  ------------------------------------------------------------ Aorta: Aortic root: The aortic root was normal in size. Ascending aorta: The ascending aorta was normal in size.  ------------------------------------------------------------ Mitral valve: Mildly thickened leaflets . Doppler: Mild regurgitation - 2 eccentric jets. There is filamentous material that moves with the posterior mitral leaflet which may represent a ruptured or flail cord. Consider TEE for better visualization. Peak gradient: 6mm Hg (D).  ------------------------------------------------------------ Left atrium: Moderately dilated (area 25.7 cm2)  ------------------------------------------------------------ Atrial septum: No defect or patent foramen ovale was identified.  ------------------------------------------------------------ Right ventricle: The cavity size was mildly dilated. Wall thickness was normal. The moderator band was prominent. Systolic function was normal.  ------------------------------------------------------------ Pulmonic valve: The valve appears to be grossly normal. Doppler: Trivial regurgitation.  ------------------------------------------------------------ Tricuspid valve: Poorly visualized. Doppler: No significant  regurgitation.  ------------------------------------------------------------ Pulmonary artery: Poorly visualized.  ------------------------------------------------------------ Right atrium: Moderately dilated (26.3 cm2).  ------------------------------------------------------------ Pericardium: There was no pericardial effusion.  ------------------------------------------------------------ Systemic veins: Inferior vena cava: The vessel was dilated; the respirophasic diameter changes were blunted (< 50%); findings are consistent with elevated central venous pressure.  ------------------------------------------------------------  2D measurements Normal Doppler Normal Left ventricle measurements LVID ED, 59 mm 43-52 Left ventricle chord, Ea, lat 9.16 cm/ ------- PLAX ann, tiss s LVID ES, 41 mm 23-38 DP chord, E/Ea, lat 13.76 ------- PLAX ann, tiss FS, chord, 31 % >29 DP PLAX Ea, med 9.94 cm/ ------- LVPW, ED 13 mm ------ ann, tiss s IVS/LVPW 1 <1.3 DP  ratio, ED E/Ea, med 12.68 ------- Ventricular septum ann, tiss IVS, ED 13 mm ------ DP Aorta Mitral valve Root diam, 34 mm ------ Peak E vel 126 cm/ ------- ED s Left atrium Peak A vel 45.4 cm/ ------- AP dim 47 mm ------ s AP dim 2.29 cm/m^2 <2.2 Deceleratio 222 ms 150-230 index n time Peak 6 mm ------- gradient, D Hg Peak E/A 2.8 ------- ratio  ------------------------------------------------------------ Prepared and Electronically Authenticated by  Rennis GoldenHilty, [place] 2014-01-14T17:25:08.170     Assessment & Plan:  1. Bilateral lower extremity edema: Patient has 1+ pitting edema to lower extremities, primarily in ankles. Recommend that patient elevate extremities to heart level while at rest and wear compression stockings. Will check creatinine and uric acid.   2. Bilateral foot pain- Patient advised to continue wearing light weight shoes. Will send prescription for Dilaudid 4 mg every 4 hours as needed #45.   3. Heart  palpitations-Patient reports periodic heart palpitations that have been occurring since before last hospital admission. Reviewed EKG from 07/06/2013, showed left ventricular hypertrophy and a prolonged QT interval. Last 2 D Echo was on 02/16/2013. In addition, last potassium level was elevated at 6.0, patient was prescribed Kayexalate while in the hospital. He states that he stopped taking the medication on discharge. Will check CMP and 2 D Echo.     4. Sickle cell anemia, HbSS-Patient states that he has been trying to stay hydrated since leaving the hospital. He was concerned that drinking was contributing to his feet swelling. Patient encourage to continue to hydrate and take his folic acid daily. Patient discontinued Hydrea months ago due to potential side effects. Patient to continue Methadone at bedtime and folic acid daily.   5. History of gout-Patient discontinued Allopurinol several months ago. Will check uric acid level.    RTC: 08/08/2013 for evaluation by Dr. Ashley RoyaltyMatthews for lower extremity edema. Schedule 2 D Echo prior to office visit.  Labs: CBC w/ diff, CMP, Uric Acid   Hollis,Lachina M, FNP

## 2013-08-03 ENCOUNTER — Telehealth: Payer: Self-pay

## 2013-08-03 LAB — URINALYSIS, COMPLETE
BACTERIA UA: NONE SEEN
Crystals: NONE SEEN
GLUCOSE, UA: NEGATIVE mg/dL
KETONES UR: NEGATIVE mg/dL
Leukocytes, UA: NEGATIVE
Nitrite: NEGATIVE
PROTEIN: 100 mg/dL — AB
Specific Gravity, Urine: 1.012 (ref 1.005–1.030)
UROBILINOGEN UA: 1 mg/dL (ref 0.0–1.0)
pH: 5 (ref 5.0–8.0)

## 2013-08-03 LAB — PATHOLOGIST SMEAR REVIEW

## 2013-08-03 LAB — COMPLETE METABOLIC PANEL WITH GFR
ALT: 43 U/L (ref 0–53)
AST: 71 U/L — ABNORMAL HIGH (ref 0–37)
Albumin: 3.7 g/dL (ref 3.5–5.2)
Alkaline Phosphatase: 167 U/L — ABNORMAL HIGH (ref 39–117)
BUN: 20 mg/dL (ref 6–23)
CO2: 23 meq/L (ref 19–32)
Calcium: 8.4 mg/dL (ref 8.4–10.5)
Chloride: 107 mEq/L (ref 96–112)
Creat: 1.05 mg/dL (ref 0.50–1.35)
GFR, EST NON AFRICAN AMERICAN: 87 mL/min
GFR, Est African American: >89 mL/min
GLUCOSE: 90 mg/dL (ref 70–99)
Potassium: 5.6 mEq/L — ABNORMAL HIGH (ref 3.5–5.3)
Sodium: 137 mEq/L (ref 135–145)
TOTAL PROTEIN: 7.2 g/dL (ref 6.0–8.3)
Total Bilirubin: 5.1 mg/dL — ABNORMAL HIGH (ref 0.2–1.2)

## 2013-08-03 LAB — URIC ACID: URIC ACID, SERUM: 11.3 mg/dL — AB (ref 4.0–7.8)

## 2013-08-03 NOTE — Telephone Encounter (Signed)
Lab contacted office w/ a critial lab result of 6.8 Hemoglobin on Pt listed above. Lab also faxed over results as well Plz Advise.

## 2013-08-04 ENCOUNTER — Telehealth: Payer: Self-pay | Admitting: Family Medicine

## 2013-08-04 DIAGNOSIS — M1A0790 Idiopathic chronic gout, unspecified ankle and foot, without tophus (tophi): Secondary | ICD-10-CM

## 2013-08-04 DIAGNOSIS — E875 Hyperkalemia: Secondary | ICD-10-CM

## 2013-08-04 MED ORDER — ALLOPURINOL 300 MG PO TABS: 300.0000 mg | ORAL_TABLET | Freq: Every day | ORAL | Status: DC

## 2013-08-04 MED ORDER — SODIUM POLYSTYRENE SULFONATE 15 GM/60ML PO SUSP: 15.0000 g | Freq: Two times a day (BID) | ORAL | Status: AC

## 2013-08-04 NOTE — Telephone Encounter (Signed)
Uric acid level increased to 11.5, will re-start Allopurinol 300 mg daily. Recommend that patient increase water intake,and start a low purine diet as discussed. Also, potassium level remains elevated. Will restart Kayexalate 60 ml  BID as directed. Discussed plan of care with Dr. Mikeal HawthorneGarba. Patient to follow up with Dr. Ashley RoyaltyMatthews on 08/08/2013 and he has a 2 D Echocardiogram scheduled for 08/08/2013 prior to appointment.

## 2013-08-08 ENCOUNTER — Encounter: Payer: Self-pay | Admitting: Internal Medicine

## 2013-08-08 ENCOUNTER — Ambulatory Visit (INDEPENDENT_AMBULATORY_CARE_PROVIDER_SITE_OTHER): Payer: BC Managed Care – PPO | Admitting: Internal Medicine

## 2013-08-08 VITALS — BP 100/66 | HR 66 | Temp 98.3°F | Resp 20 | Wt 170.0 lb

## 2013-08-08 DIAGNOSIS — E875 Hyperkalemia: Secondary | ICD-10-CM

## 2013-08-08 DIAGNOSIS — D571 Sickle-cell disease without crisis: Secondary | ICD-10-CM

## 2013-08-08 MED ORDER — METHADONE HCL 10 MG PO TABS: 10.0000 mg | ORAL_TABLET | Freq: Every day | ORAL | Status: DC

## 2013-08-08 MED ORDER — HYDROXYUREA 500 MG PO CAPS: 1000.0000 mg | ORAL_CAPSULE | Freq: Every day | ORAL | Status: DC

## 2013-08-08 NOTE — Progress Notes (Signed)
Patient ID: [name], male   DOB: 12/22/1970, 43 y.o.   MRN: [id number]   [name] Amasslliott Bantz, is a 43 y.o. male  [id number]  [id number]:[id number]  DOB - 12/22/1970  CC:  Chief Complaint  Patient presents with  . Sickle Cell Anemia    F/U VISIT       HPI: [name] Amasslliott Brancato is a 43 y.o. male here today for follow up on Hyperkalemia and Swelling in feet. He also is following up on his Hb SS. He reports that the swelling has decreased in legs since last visit. He also reports that he has been doing very well with regards to the Hb SS. He has had only minimal exacerbation of pain which he has been able to control with oral medications.  Patient has No headache, No chest pain, No abdominal pain - No Nausea, No new weakness tingling or numbness, No Cough - SOB.  Allergies  Allergen Reactions  . Toradol [Ketorolac Tromethamine]     Pt stated "it scars my kidneys"    Past Medical History  Diagnosis Date  . CHF (congestive heart failure)   . Gouty arthropathy   . Chronic fatigue syndrome   . Sickle cell anemia   . Anxiety   . Depression   . Hand joint pain 10/19/2012  . Hyperkalemia 03/25/2011   Current Outpatient Prescriptions on File Prior to Visit  Medication Sig Dispense Refill  . allopurinol (ZYLOPRIM) 300 MG tablet Take 1 tablet (300 mg total) by mouth daily.  30 tablet  2  . folic acid (FOLVITE) 1 MG tablet Take 1 tablet (1 mg total) by mouth daily.  30 tablet  11  . metoprolol (LOPRESSOR) 50 MG tablet Take 1 tablet (50 mg total) by mouth daily.  60 tablet  3  . sodium polystyrene (KAYEXALATE) 15 GM/60ML suspension Take 60 mLs (15 g total) by mouth 2 (two) times daily.  500 mL  0  . zolpidem (AMBIEN) 10 MG tablet take 1 tablet by mouth at bedtime if needed for sleep  30 tablet  3  . ALPRAZolam (XANAX) 0.25 MG tablet Take 1 tablet (0.25 mg total) by mouth 3 (three) times daily as needed for anxiety.  90 tablet  2  . HYDROmorphone (DILAUDID) 4 MG tablet Take 1 tablet (4 mg total) by  mouth every 4 (four) hours as needed for severe pain.  45 tablet  0  . ibuprofen (ADVIL,MOTRIN) 800 MG tablet Take 1 tablet (800 mg total) by mouth every 8 (eight) hours as needed for pain.  90 tablet  3   No current facility-administered medications on file prior to visit.   Family History  Problem Relation Age of Onset  . Sickle cell anemia    . Sickle cell trait Mother    History   Social History  . Marital Status: Single    Spouse Name: N/A    Number of Children: N/A  . Years of Education: N/A   Occupational History  . Not on file.   Social History Main Topics  . Smoking status: Never Smoker   . Smokeless tobacco: Never Used  . Alcohol Use: No  . Drug Use: No  . Sexual Activity: No   Other Topics Concern  . 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  Estranged from his biological father.    Review of Systems: Constitutional: Negative for fever, chills, diaphoresis, activity change, appetite change and fatigue. HENT: Negative for ear pain, nosebleeds, congestion,  facial swelling, rhinorrhea, neck pain, neck stiffness and ear discharge.  Eyes: Negative for pain, discharge, redness, itching and visual disturbance. Respiratory: Negative for cough, choking, chest tightness, shortness of breath, wheezing and stridor.  Cardiovascular: Negative for chest pain, palpitations and leg swelling. Gastrointestinal: Negative for abdominal distention. Genitourinary: Negative for dysuria, urgency, frequency, hematuria, flank pain, decreased urine volume, difficulty urinating and dyspareunia.  Musculoskeletal: Negative for back pain, joint swelling, arthralgia and gait problem. Neurological: Negative for dizziness, tremors, seizures, syncope, facial asymmetry, speech difficulty, weakness, light-headedness, numbness and headaches.  Hematological: Negative for adenopathy. Does not bruise/bleed easily. Psychiatric/Behavioral: Negative for hallucinations, behavioral  problems, confusion, dysphoric mood, decreased concentration and agitation.    Objective:         Filed Vitals:   08/08/13 1536  BP: 100/66  Pulse: 66  Temp: 98.3 F (36.8 C)  Resp: 20    Physical Exam: Constitutional: Patient appears well-developed and well-nourished. No distress. HENT: Normocephalic, atraumatic, External right and left ear normal. Oropharynx is clear and moist.  Eyes: Conjunctivae and EOM are normal. PERRLA, no scleral icterus. Neck: Normal ROM. Neck supple. No JVD. No tracheal deviation. No thyromegaly. CVS: RRR, S1/S2 +, no murmurs, no gallops, no carotid bruit.  Pulmonary: Effort and breath sounds normal, no stridor, rhonchi, wheezes, rales.  Abdominal: Soft. BS +, no distension, tenderness, rebound or guarding.  Musculoskeletal: Normal range of motion. No edema and no tenderness.  Lymphadenopathy: No lymphadenopathy noted, cervical, inguinal or axillary Neuro: Alert. Normal reflexes, muscle tone coordination. No cranial nerve deficit. Skin: Skin is warm and dry. No rash noted. Not diaphoretic. No erythema. No pallor. Psychiatric: Normal mood and affect. Behavior, judgment, thought content normal.  Lab Results  Component Value Date   WBC 15.4* 08/02/2013   HGB 6.8* 08/02/2013   HCT 19.6* 08/02/2013   MCV 88.7 08/02/2013   PLT 504* 08/02/2013   Lab Results  Component Value Date   CREATININE 1.05 08/02/2013   BUN 20 08/02/2013   NA 137 08/02/2013   K 5.6* 08/02/2013   CL 107 08/02/2013   CO2 23 08/02/2013    Lab Results  Component Value Date   HGBA1C <4.0 02/17/2012   Lipid Panel     Component Value Date/Time   CHOL 110 12/09/2011 1345   TRIG 201* 12/09/2011 1345   HDL 17* 12/09/2011 1345   CHOLHDL 6.5 12/09/2011 1345   VLDL 40 12/09/2011 1345   LDLCALC 53 12/09/2011 1345       Assessment and plan:   1. Hb-SS disease without crisis - Sickle cell disease -  Markham Jordan has been doing very well with regard to SCD. Pt agreeable to Hydrea. Will start  Hydrea 1000 mg daily. We discussed the need for good hydration, monitoring of hydration status, avoidance of heat, cold, stress, and infection triggers. We discussed the risks and benefits of Hydrea, including bone marrow suppression, the possibility of GI upset, skin ulcers, hair thinning, and teratogenicity. The patient was reminded of the need to seek medical attention of any symptoms of bleeding, anemia, or infection. Continue folic acid 1 mg daily to prevent aplastic bone marrow crises.   - Pulmonary evaluation - Patient denies severe recurrent wheezes, shortness of breath with exercise, or persistent cough. If these symptoms develop, pulmonary function tests with spirometry will be ordered, and if abnormal, plan on referral to Pulmonology for further evaluation.  - Cardiac - Routine screening for pulmonary hypertension is not recommended.  - Eye - High risk of proliferative retinopathy. Annual  eye exam with retinal exam recommended to patient.  - Immunization status - Pt UTD on vaccines. Yearly influenza vaccination is recommended, as well as being up to date with Meningococcal and Pneumococcal vaccines.   - Acute and chronic painful episodes - We agreed on Dilaudid 180 tabs/month and Methadone 30 tabs/month.  We discussed that he is to receive his Schedule II prescriptions only from us. He is also aware that his prescription history is available to us online through the [facility] [place] [facility]. Controlled substance agreement signed (Date). We reminded (Pt) that all patients receiving Schedule II narcotics must be seen for follow up every three months. We reviewed the terms of our pain agreement, including the need to keep medicines in a safe locked location away from children or pets, and the need to report excess sedation or constipation, measures to avoid constipation, and policies related to early refills and stolen prescriptions. According to the [place] Chronic Pain Initiative program, we have reviewed details  related to analgesia, adverse effects, aberrant behaviors.  - Iron overload from chronic transfusion.  Last levels 1 year ago >3000. Discussed exjade and patient still resistant to taking Exjade.  - Vitamin D deficiency - Pt is at risk for deficiency. Will check Vitamin D levels.  The above recommendations are taken from the NIH Evidence-Based Management of Sickle Cell Disease: Expert Panel Report, 1610920149.   - methadone (DOLOPHINE) 10 MG tablet; Take 1 tablet (10 mg total) by mouth at bedtime.  Dispense: 30 tablet; Refill: 0 - hydroxyurea (HYDREA) 500 MG capsule; Take 2 capsules (1,000 mg total) by mouth daily. May take with food to minimize GI side effects.  Dispense: 60 capsule; Refill: 3  2. Hyperkalemia - Will recheck labs today. - Basic Metabolic Panel   Return in about 3 months (around 11/08/2013) for Hb SS without crisis, Hyperkalemia.  The patient was given clear instructions to go to ER or return to medical center if symptoms don't improve, worsen or new problems develop. The patient verbalized understanding. The patient was told to call to get lab results if they haven't heard anything in the next week.     This note has been created with Education officer, environmentalDragon speech recognition software and smart phrase technology. Any transcriptional errors are unintentional.    Lillith Mcneff A., MD [facility] [place], [place] [phone]   08/08/2013, 4:31 PM

## 2013-08-09 LAB — BASIC METABOLIC PANEL
BUN: 13 mg/dL (ref 6–23)
CO2: 22 mEq/L (ref 19–32)
CREATININE: 0.85 mg/dL (ref 0.50–1.35)
Calcium: 8.5 mg/dL (ref 8.4–10.5)
Chloride: 107 mEq/L (ref 96–112)
Glucose, Bld: 82 mg/dL (ref 70–99)
Potassium: 5.4 mEq/L — ABNORMAL HIGH (ref 3.5–5.3)
SODIUM: 135 meq/L (ref 135–145)

## 2013-09-13 ENCOUNTER — Telehealth: Payer: Self-pay | Admitting: Internal Medicine

## 2013-09-13 DIAGNOSIS — D571 Sickle-cell disease without crisis: Secondary | ICD-10-CM

## 2013-09-13 DIAGNOSIS — R000 Tachycardia, unspecified: Secondary | ICD-10-CM

## 2013-09-13 MED ORDER — METOPROLOL TARTRATE 50 MG PO TABS: 50.0000 mg | ORAL_TABLET | Freq: Every day | ORAL | Status: DC

## 2013-09-13 MED ORDER — METHADONE HCL 10 MG PO TABS: 10.0000 mg | ORAL_TABLET | Freq: Every day | ORAL | Status: DC

## 2013-09-13 NOTE — Telephone Encounter (Signed)
Refill request for Methadone 10mg  LOV 08/08/2013. Please advise. Thanks!

## 2013-09-13 NOTE — Telephone Encounter (Signed)
Prescription refilled for Methadone 10 mg #30

## 2013-09-28 ENCOUNTER — Telehealth: Payer: Self-pay

## 2013-09-28 DIAGNOSIS — D5700 Hb-SS disease with crisis, unspecified: Secondary | ICD-10-CM

## 2013-09-28 NOTE — Telephone Encounter (Signed)
Refill request for dilaudid. LOV 08/08/2013. Please advise. Thanks!

## 2013-09-29 MED ORDER — HYDROMORPHONE HCL 4 MG PO TABS: 4.0000 mg | ORAL_TABLET | ORAL | Status: DC | PRN

## 2013-09-29 NOTE — Telephone Encounter (Signed)
Prescription written for Dilaudid 4 mg #45 tabs

## 2013-09-30 ENCOUNTER — Telehealth (HOSPITAL_COMMUNITY): Payer: Self-pay | Admitting: *Deleted

## 2013-09-30 NOTE — Telephone Encounter (Signed)
Message left to notify patient of [facility] Sickle Cell Anemia[facility] on Sept. 18,2015.

## 2013-10-05 ENCOUNTER — Telehealth: Payer: Self-pay

## 2013-10-05 DIAGNOSIS — G4700 Insomnia, unspecified: Secondary | ICD-10-CM

## 2013-10-05 DIAGNOSIS — D5700 Hb-SS disease with crisis, unspecified: Secondary | ICD-10-CM

## 2013-10-05 DIAGNOSIS — Z604 Social exclusion and rejection: Secondary | ICD-10-CM

## 2013-10-05 DIAGNOSIS — F411 Generalized anxiety disorder: Secondary | ICD-10-CM

## 2013-10-05 MED ORDER — ALPRAZOLAM 0.25 MG PO TABS: 0.2500 mg | ORAL_TABLET | Freq: Three times a day (TID) | ORAL | Status: DC | PRN

## 2013-10-05 MED ORDER — ZOLPIDEM TARTRATE 10 MG PO TABS: ORAL_TABLET | ORAL | Status: DC

## 2013-10-05 NOTE — Telephone Encounter (Signed)
Prescription reordered fro ambien 10 mg #30 and Xanax 0.25 mg #90 both with 3 refills.

## 2013-10-05 NOTE — Telephone Encounter (Signed)
Received refill request via fax from pharmacy for zolpidem  and alprazolam 0.25mg . LOV 08/08/2013 Please advise. Thanks!

## 2013-10-12 ENCOUNTER — Telehealth: Payer: Self-pay | Admitting: Internal Medicine

## 2013-10-13 MED ORDER — IBUPROFEN 800 MG PO TABS: 800.0000 mg | ORAL_TABLET | Freq: Three times a day (TID) | ORAL | Status: DC | PRN

## 2013-10-13 NOTE — Telephone Encounter (Signed)
Refill request for alprazolam 0.25mg  and zolpidem . LOV 08/08/2013. Please advise. Thanks!

## 2013-10-16 IMAGING — CR DG CERVICAL SPINE COMPLETE 4+V
7 series · 7 of 7 positions shown · non-contrast
Comparison: None.

CLINICAL DATA: 41-year-old male with persistent neck pain.

CERVICAL SPINE - COMPLETE 4+ VIEW

[w c-spine lat]
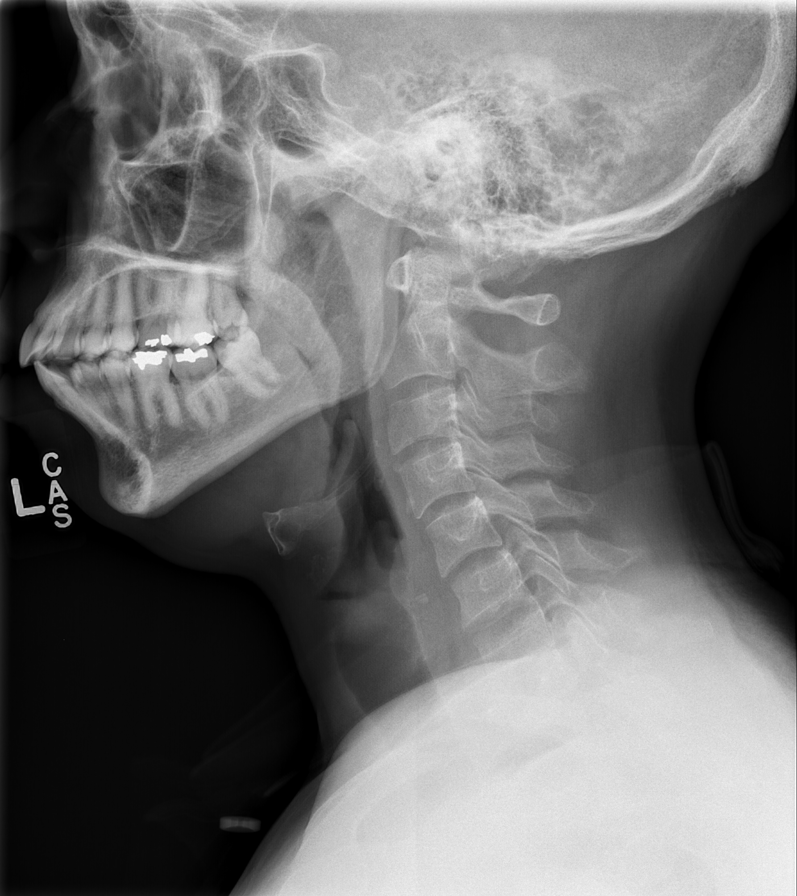

[w swimmers view * (1 of 2)]
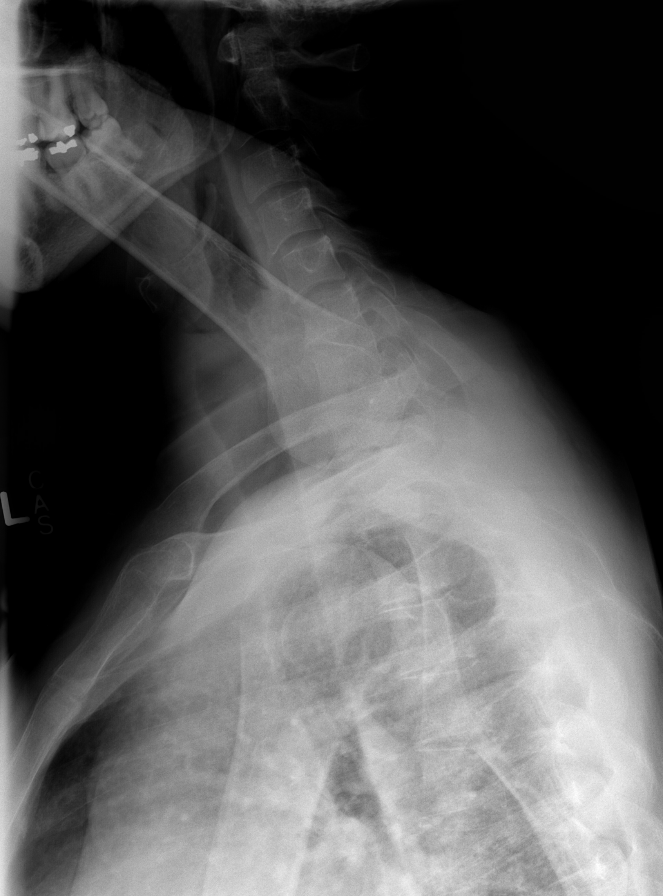

[w c-spine oblique (1 of 2)]
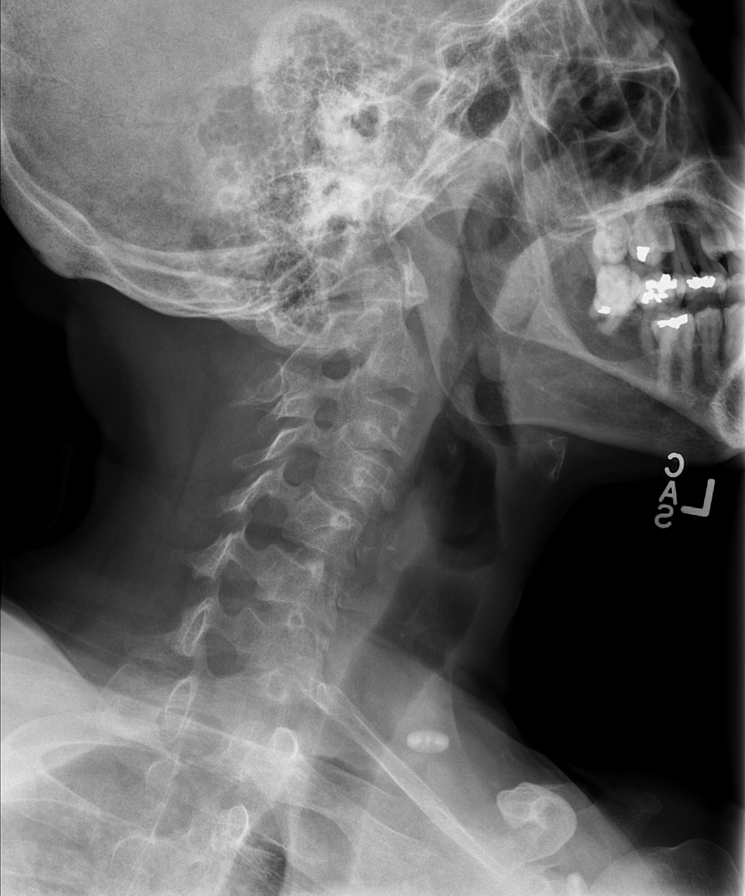

[w c-spine oblique (2 of 2)]
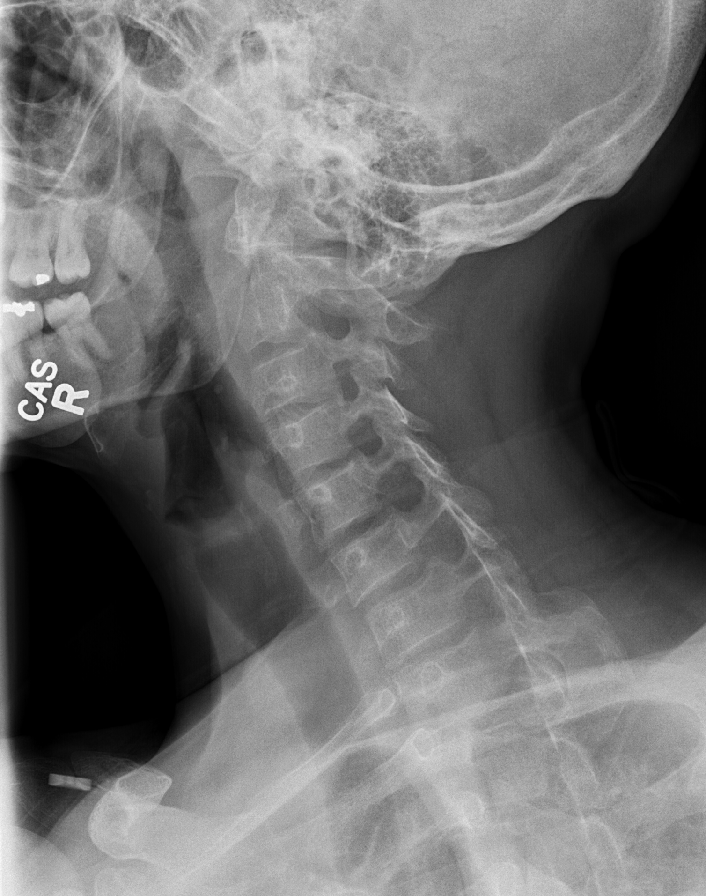

[w c-spine a.p. *]
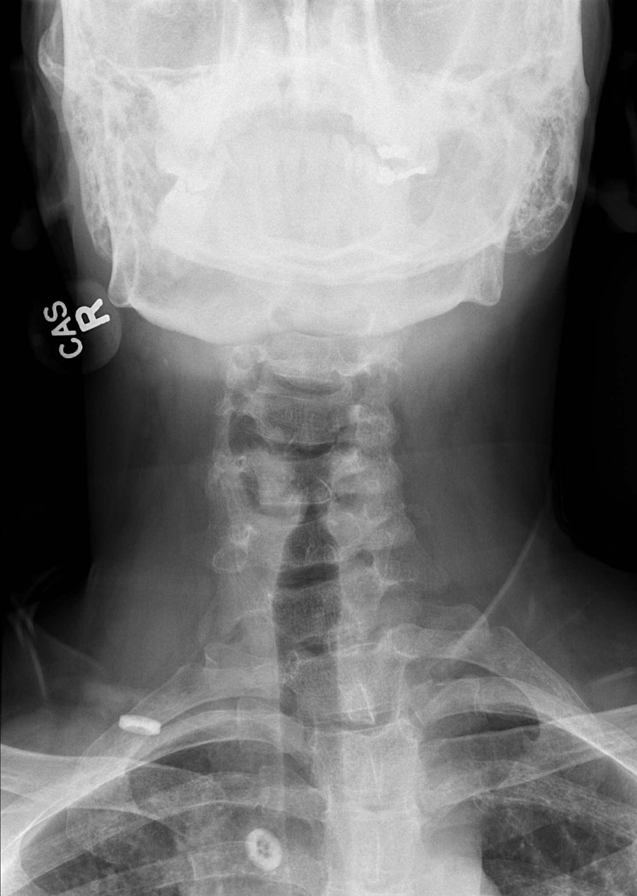

[w c-spine odontoid *]
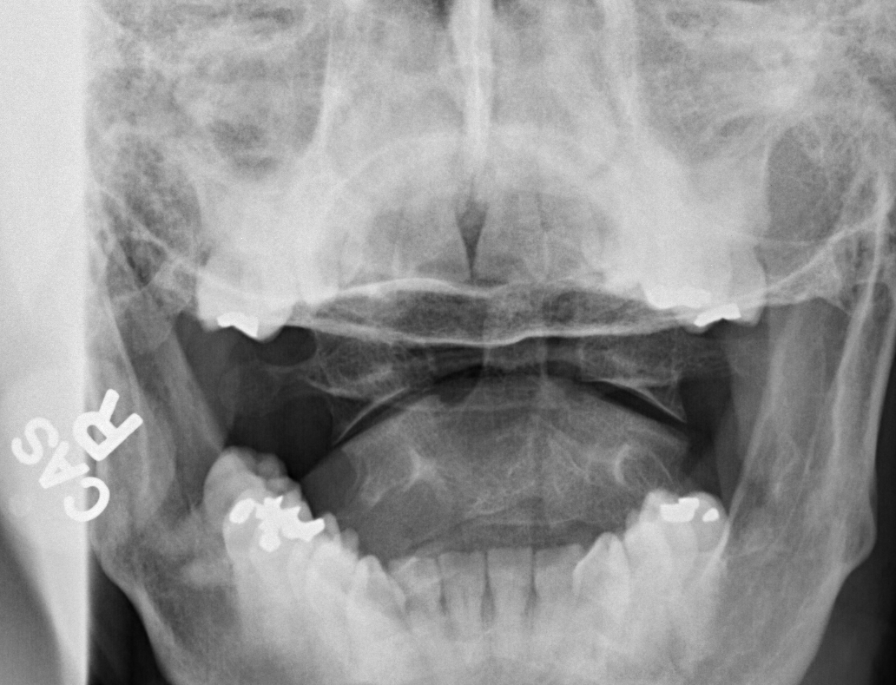

[w swimmers view * (2 of 2)]
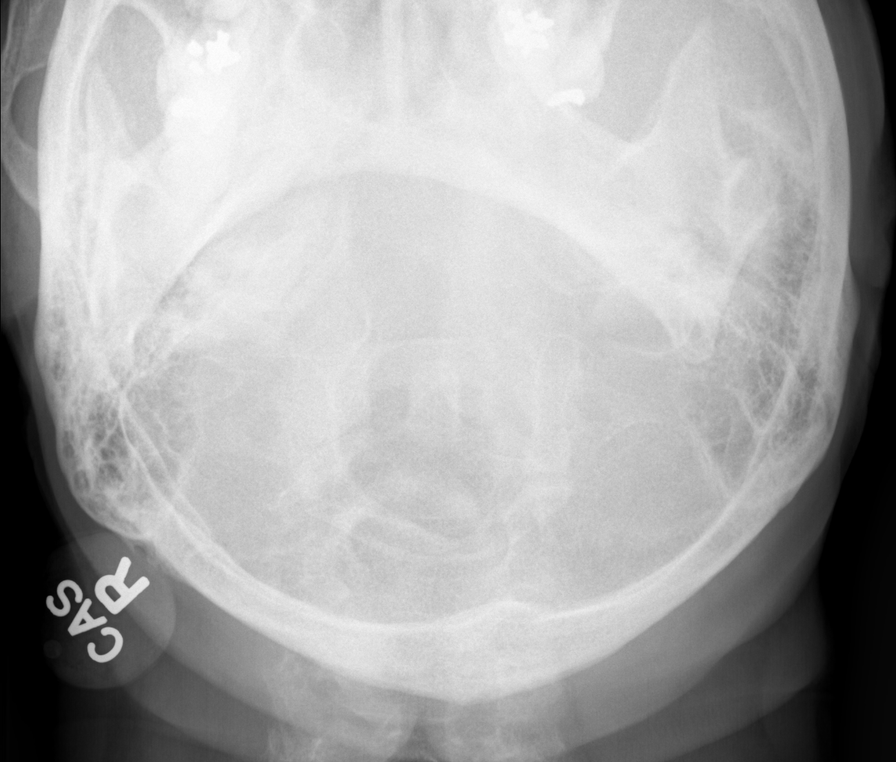

[7 of 7 positions shown; findings below may reference images not displayed]

FINDINGS: Prevertebral soft tissue contours are within normal limits.
Cervicothoracic junction alignment is within normal limits.
Relatively preserved disc spaces. Bilateral posterior element
alignment is within normal limits.  Scoliosis on the AP view in
both the visualized cervical and upper thoracic spine.  C1-C2
alignment and odontoid process within normal limits.
FINDINGS: 
IMPRESSION: No acute fracture or listhesis identified in the
cervical spine.  Ligamentous injury is not excluded.  Scoliosis.

## 2013-10-24 ENCOUNTER — Telehealth: Payer: Self-pay | Admitting: Internal Medicine

## 2013-10-24 NOTE — Telephone Encounter (Signed)
Refill on dilaudid  and methadone  LOV 08/08/2013. Please advise. Thanks!

## 2013-10-27 ENCOUNTER — Other Ambulatory Visit: Payer: Self-pay | Admitting: Internal Medicine

## 2013-10-27 ENCOUNTER — Telehealth: Payer: Self-pay | Admitting: Internal Medicine

## 2013-10-27 DIAGNOSIS — D571 Sickle-cell disease without crisis: Secondary | ICD-10-CM

## 2013-10-27 DIAGNOSIS — D5700 Hb-SS disease with crisis, unspecified: Secondary | ICD-10-CM

## 2013-10-27 MED ORDER — METHADONE HCL 10 MG PO TABS: 10.0000 mg | ORAL_TABLET | Freq: Every day | ORAL | Status: DC

## 2013-10-27 MED ORDER — HYDROMORPHONE HCL 4 MG PO TABS: 4.0000 mg | ORAL_TABLET | ORAL | Status: DC | PRN

## 2013-10-27 NOTE — Telephone Encounter (Signed)
Patient requested doctor's note for 9/23-9/24/15 due to Sickle Cell Crisis

## 2013-10-27 NOTE — Progress Notes (Signed)
Prescription refilled for Hydromorphone 4 mg # 90 tabs.

## 2013-10-27 NOTE — Progress Notes (Signed)
Prescription refilled for Methadone 10 mg #30 tabs

## 2013-10-28 ENCOUNTER — Encounter: Payer: Self-pay | Admitting: Internal Medicine

## 2013-10-28 ENCOUNTER — Telehealth: Payer: Self-pay | Admitting: Internal Medicine

## 2013-10-28 NOTE — Telephone Encounter (Signed)
Dr. Ashley Royalty, Patient is requesting a work note (see Charlene's message below) Please advise. Thanks!

## 2013-10-28 NOTE — Telephone Encounter (Signed)
Patient called to request a note to return to work on Monday 10/31/13. Patient stated he was out of work 10/26/13, 10/27/13, and today due to a crisis. He would like the letter to be faxed to [phone].

## 2013-11-14 ENCOUNTER — Ambulatory Visit (INDEPENDENT_AMBULATORY_CARE_PROVIDER_SITE_OTHER): Payer: BC Managed Care – PPO | Admitting: Family Medicine

## 2013-11-14 ENCOUNTER — Encounter: Payer: Self-pay | Admitting: Family Medicine

## 2013-11-14 ENCOUNTER — Other Ambulatory Visit: Payer: Self-pay | Admitting: Family Medicine

## 2013-11-14 VITALS — BP 134/68 | HR 102 | Temp 99.7°F | Resp 16 | Ht 71.0 in | Wt 173.0 lb

## 2013-11-14 DIAGNOSIS — I499 Cardiac arrhythmia, unspecified: Secondary | ICD-10-CM

## 2013-11-14 DIAGNOSIS — G4700 Insomnia, unspecified: Secondary | ICD-10-CM

## 2013-11-14 DIAGNOSIS — Z23 Encounter for immunization: Secondary | ICD-10-CM

## 2013-11-14 DIAGNOSIS — D571 Sickle-cell disease without crisis: Secondary | ICD-10-CM

## 2013-11-14 DIAGNOSIS — M1A0790 Idiopathic chronic gout, unspecified ankle and foot, without tophus (tophi): Secondary | ICD-10-CM

## 2013-11-14 MED ORDER — ALLOPURINOL 300 MG PO TABS: 300.0000 mg | ORAL_TABLET | Freq: Every day | ORAL | Status: DC

## 2013-11-14 MED ORDER — ZOLPIDEM TARTRATE 10 MG PO TABS: ORAL_TABLET | ORAL | Status: DC

## 2013-11-14 MED ORDER — METHADONE HCL 10 MG PO TABS: 10.0000 mg | ORAL_TABLET | Freq: Every day | ORAL | Status: DC

## 2013-11-14 NOTE — Patient Instructions (Signed)
Insomnia Insomnia is frequent trouble falling and/or staying asleep. Insomnia can be a long term problem or a short term problem. Both are common. Insomnia can be a short term problem when the wakefulness is related to a certain stress or worry. Long term insomnia is often related to ongoing stress during waking hours and/or poor sleeping habits. Overtime, sleep deprivation itself can make the problem worse. Every little thing feels more severe because you are overtired and your ability to cope is decreased. CAUSES   Stress, anxiety, and depression.  Poor sleeping habits.  Distractions such as TV in the bedroom.  Naps close to bedtime.  Engaging in emotionally charged conversations before bed.  Technical reading before sleep.  Alcohol and other sedatives. They may make the problem worse. They can hurt normal sleep patterns and normal dream activity.  Stimulants such as caffeine for several hours prior to bedtime.  Pain syndromes and shortness of breath can cause insomnia.  Exercise late at night.  Changing time zones may cause sleeping problems (jet lag). It is sometimes helpful to have someone observe your sleeping patterns. They should look for periods of not breathing during the night (sleep apnea). They should also look to see how long those periods last. If you live alone or observers are uncertain, you can also be observed at a sleep clinic where your sleep patterns will be professionally monitored. Sleep apnea requires a checkup and treatment. Give your caregivers your medical history. Give your caregivers observations your family has made about your sleep.  SYMPTOMS   Not feeling rested in the morning.  Anxiety and restlessness at bedtime.  Difficulty falling and staying asleep. TREATMENT   Your caregiver may prescribe treatment for an underlying medical disorders. Your caregiver can give advice or help if you are using alcohol or other drugs for self-medication. Treatment  of underlying problems will usually eliminate insomnia problems.  Medications can be prescribed for short time use. They are generally not recommended for lengthy use.  Over-the-counter sleep medicines are not recommended for lengthy use. They can be habit forming.  You can promote easier sleeping by making lifestyle changes such as:  Using relaxation techniques that help with breathing and reduce muscle tension.  Exercising earlier in the day.  Changing your diet and the time of your last meal. No night time snacks.  Establish a regular time to go to bed.  Counseling can help with stressful problems and worry.  Soothing music and white noise may be helpful if there are background noises you cannot remove.  Stop tedious detailed work at least one hour before bedtime. HOME CARE INSTRUCTIONS   Keep a diary. Inform your caregiver about your progress. This includes any medication side effects. See your caregiver regularly. Take note of:  Times when you are asleep.  Times when you are awake during the night.  The quality of your sleep.  How you feel the next day. This information will help your caregiver care for you.  Get out of bed if you are still awake after 15 minutes. Read or do some quiet activity. Keep the lights down. Wait until you feel sleepy and go back to bed.  Keep regular sleeping and waking hours. Avoid naps.  Exercise regularly.  Avoid distractions at bedtime. Distractions include watching television or engaging in any intense or detailed activity like attempting to balance the household checkbook.  Develop a bedtime ritual. Keep a familiar routine of bathing, brushing your teeth, climbing into bed at the same   time each night, listening to soothing music. Routines increase the success of falling to sleep faster.  Use relaxation techniques. This can be using breathing and muscle tension release routines. It can also include visualizing peaceful scenes. You can  also help control troubling or intruding thoughts by keeping your mind occupied with boring or repetitive thoughts like the old concept of counting sheep. You can make it more creative like imagining planting one beautiful flower after another in your backyard garden.  During your day, work to eliminate stress. When this is not possible use some of the previous suggestions to help reduce the anxiety that accompanies stressful situations. MAKE SURE YOU:   Understand these instructions.  Will watch your condition.  Will get help right away if you are not doing well or get worse. Document Released: 01/18/2000 Document Revised: 04/14/2011 Document Reviewed: 02/17/2007 ExitCare Patient Information 2015 [employer]. This information is not intended to replace advice given to you by your health care provider. Make sure you discuss any questions you have with your health care provider.  

## 2013-11-14 NOTE — Progress Notes (Signed)
Subjective:    Patient ID: [name], male    DOB: November 12, 1970, 43 y.o.   MRN: [id number] 2/10  HPI Patient is in for a follow up of sickle cell anemia, HbSS. He states that his current pain intensity is 2/10 intermittent pain to bilateral lower extremities.   He maintains  that he last had Methadone 30 mg and Ibuprofen around 9 pm last evening with moderate relief. [name] states that he discontinued Hydrea therapy greater than 1 month ago, but has continued folic acid therapy and hydration consistently.   [name] is complaining of insomnia. He states that he has a difficult time falling asleep. He is currently taking Zolpidem 10 mg nightly with minimal relief. He states that he does not have a consistent sleep routine. He denies fatigue, depression, weakness, difficulty concentrating, or history of sleep apnea.   Past Medical History  Diagnosis Date  . CHF (congestive heart failure)   . Gouty arthropathy   . Chronic fatigue syndrome   . Sickle cell anemia   . Anxiety   . Depression   . Hand joint pain 10/19/2012  . Hyperkalemia 03/25/2011   Review of Systems  Constitutional: Negative.   HENT: Negative.   Eyes: Negative.   Respiratory: Negative.   Cardiovascular: Negative.   Gastrointestinal: Negative.   Endocrine: Negative.   Genitourinary: Negative.   Musculoskeletal: Positive for myalgias (bilateral lower extremitiy pain).  Skin: Negative.   Allergic/Immunologic: Negative.   Neurological: Negative.   Hematological: Negative.   Psychiatric/Behavioral: Positive for sleep disturbance (difficulty falling alseep).       Objective:   Physical Exam  Constitutional: He is oriented to person, place, and time.  HENT:  Head: Normocephalic and atraumatic.  Right Ear: External ear normal.  Left Ear: External ear normal.  Mouth/Throat: Oropharynx is clear and moist.  Eyes: Conjunctivae and EOM are normal. Pupils are equal, round, and reactive to light. Scleral icterus is  present.  Neck: Neck supple.  Cardiovascular: Normal rate and normal pulses.  An irregularly irregular rhythm present. PMI is not displaced.   Pulmonary/Chest: Effort normal and breath sounds normal. He has no decreased breath sounds.  Abdominal: Soft. Bowel sounds are normal.  Musculoskeletal: Normal range of motion.  Neurological: He is alert and oriented to person, place, and time. He has normal reflexes.  Skin: Skin is warm and dry.  Psychiatric: He has a normal mood and affect. His behavior is normal. Judgment and thought content normal.         BP 134/68  Pulse 102  Temp(Src) 99.7 F (37.6 C) (Oral)  Resp 16  Ht 5\' 11"  (1.803 Rojas)  Wt 173 lb (78.472 kg)  BMI 24.14 kg/m2 Assessment & Plan:  1. 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 Will repeat serum uric acid and refill allopurinol.  - allopurinol (ZYLOPRIM) 300 MG tablet; Take 1 tablet (300 mg total) by mouth daily.  Dispense: 30 tablet; Refill: 2 - Uric Acid  2. Insomnia Recommend that patient start a sleep routine. Recommend that he prepare for bed, do not eat within an hour of lying down, refrain from sending text messages, surfing social media, working on lap top, or listening to the radio.  - zolpidem (AMBIEN) 10 MG tablet; take 1 tablet by mouth at bedtime if needed for sleep  Dispense: 30 tablet; Refill: 3  3. Hb-SS disease without crisis - Sickle cell disease - Patient reports that he  stopped taking Hydrea 1 month ago. I recommend that he restart Hydrea 500 mg twice daily. We discussed the need for good hydration, monitoring of hydration status, avoidance of heat, cold, stress, and infection triggers. We discussed the risks and benefits of Hydrea, including bone marrow suppression, the possibility of GI upset, skin ulcers, hair thinning, and teratogenicity. He states that his insurance is not covering Hydrea and he is not interested in  continuing therapy. The patient was reminded of the need to seek medical attention of any symptoms of bleeding, anemia, or infection. Continue folic acid 1 mg daily to prevent aplastic bone marrow crises.    -Pulmonary evaluation - Patient denies severe recurrent wheezes, shortness of breath with exercise, or persistent cough. If these symptoms develop, pulmonary function tests with spirometry will be ordered, and if abnormal, plan on referral to Pulmonology for further evaluation.  -Cardiac - Routine screening for pulmonary hypertension is not recommended. However, ordered an echocardiogram on 08/02/2013. Patient did not report for 2 D Echo, he states that he feels fine and his work schedule has been very busy. Discussed the importance of following up for 2D Echo.   - Eye - High risk of proliferative retinopathy. Annual eye exam with retinal exam recommended to patient. He reports that he is up to date  -Immunization status - Influenza and pneumoccoccal vaccination received today.   - Acute and chronic painful episodes - Patient understands prescription refill policy.   . Vitamin D deficiency - Will check Vitamin D level today   The above recommendations are taken from the NIH Evidence-Based Management of Sickle Cell Disease: Expert Panel Report, [id number].   Chronic medical problems including diabetes, hypertension and COPD. We recommended she try to find a PCP in [place].  - methadone (DOLOPHINE) 10 MG tablet; Take 1 tablet (10 mg total) by mouth at bedtime.  Dispense: 30 tablet; Refill: 0 Reviewed [place] Substance Reporting system prior to reorder - CBC with Differential - COMPLETE METABOLIC PANEL WITH GFR - Vitamin D, 25-hydroxy  4. Need for immunization against influenza - Flu Vaccine QUAD 36+ mos IM (Fluarix)  5. Immunization due - Pneumococcal polysaccharide vaccine 23-valent greater than or equal to 2yo subcutaneous/IM  6. Irregular heartbeat EKG consistent with previous EKG, slight  improvement. Patient can continue with Methadone therapy 10 mg at bedtime. Also, continue Lopressor and previously prescribed.  - EKG 12-Lead   [name],[name] M, FNP RTC: 3 months [place]

## 2013-11-15 ENCOUNTER — Telehealth: Payer: Self-pay | Admitting: Hematology

## 2013-11-15 LAB — CBC WITH DIFFERENTIAL/PLATELET
Basophils Absolute: 0 10*3/uL (ref 0.0–0.1)
Basophils Relative: 0 % (ref 0–1)
Eosinophils Absolute: 0 10*3/uL (ref 0.0–0.7)
Eosinophils Relative: 0 % (ref 0–5)
HCT: 18.7 % — ABNORMAL LOW (ref 39.0–52.0)
Hemoglobin: 6.6 g/dL — CL (ref 13.0–17.0)
LYMPHS ABS: 4.7 10*3/uL — AB (ref 0.7–4.0)
LYMPHS PCT: 31 % (ref 12–46)
MCH: 33.2 pg (ref 26.0–34.0)
MCHC: 35.3 g/dL (ref 30.0–36.0)
MCV: 94 fL (ref 78.0–100.0)
Monocytes Absolute: 1.7 10*3/uL — ABNORMAL HIGH (ref 0.1–1.0)
Monocytes Relative: 11 % (ref 3–12)
NEUTROS ABS: 8.8 10*3/uL — AB (ref 1.7–7.7)
Neutrophils Relative %: 58 % (ref 43–77)
PLATELETS: 256 10*3/uL (ref 150–400)
RBC: 1.99 MIL/uL — AB (ref 4.22–5.81)
RDW: 30.1 % — ABNORMAL HIGH (ref 11.5–15.5)
WBC: 15.1 10*3/uL — AB (ref 4.0–10.5)

## 2013-11-15 LAB — COMPLETE METABOLIC PANEL WITH GFR
ALT: 41 U/L (ref 0–53)
AST: 201 U/L — AB (ref 0–37)
Albumin: 3.2 g/dL — ABNORMAL LOW (ref 3.5–5.2)
Alkaline Phosphatase: 109 U/L (ref 39–117)
BILIRUBIN TOTAL: 6.9 mg/dL — AB (ref 0.2–1.2)
BUN: 23 mg/dL (ref 6–23)
CO2: 19 meq/L (ref 19–32)
Calcium: 8.3 mg/dL — ABNORMAL LOW (ref 8.4–10.5)
Chloride: 111 mEq/L (ref 96–112)
Creat: 0.95 mg/dL (ref 0.50–1.35)
GFR, Est Non African American: >89 mL/min
Glucose, Bld: 105 mg/dL — ABNORMAL HIGH (ref 70–99)
Potassium: 6.4 mEq/L (ref 3.5–5.3)
Sodium: 136 mEq/L (ref 135–145)
Total Protein: 7.5 g/dL (ref 6.0–8.3)

## 2013-11-15 LAB — VITAMIN D 25 HYDROXY (VIT D DEFICIENCY, FRACTURES): Vit D, 25-Hydroxy: <10 ng/mL — ABNORMAL LOW (ref 30–89)

## 2013-11-15 LAB — URIC ACID: Uric Acid, Serum: 10.7 mg/dL — ABNORMAL HIGH (ref 4.0–7.8)

## 2013-11-15 NOTE — Telephone Encounter (Signed)
[facility] called with critical labs for patient:  Potassium:  6.4, repeated & verified with slight hemolysis, collected 11-14-13 @ 12:13.  Read back and confirmed with caller / Hemoglobin 6.6, repeated & verified, collected 11-14-2013 @ 12:13.  Read back and confirmed with caller. /

## 2013-11-16 ENCOUNTER — Encounter (HOSPITAL_COMMUNITY): Payer: Self-pay | Admitting: Emergency Medicine

## 2013-11-16 ENCOUNTER — Emergency Department (HOSPITAL_COMMUNITY)
Admission: EM | Admit: 2013-11-16 | Discharge: 2013-11-16 | Disposition: A | Discharge status: Home / Self Care | Payer: BC Managed Care – PPO | Attending: Emergency Medicine | Admitting: Emergency Medicine

## 2013-11-16 ENCOUNTER — Other Ambulatory Visit: Payer: Self-pay | Admitting: Family Medicine

## 2013-11-16 ENCOUNTER — Telehealth: Payer: Self-pay | Admitting: Internal Medicine

## 2013-11-16 ENCOUNTER — Emergency Department (HOSPITAL_COMMUNITY): Payer: BC Managed Care – PPO

## 2013-11-16 ENCOUNTER — Telehealth: Payer: Self-pay

## 2013-11-16 ENCOUNTER — Telehealth: Payer: Self-pay | Admitting: Family Medicine

## 2013-11-16 DIAGNOSIS — F329 Major depressive disorder, single episode, unspecified: Secondary | ICD-10-CM | POA: Insufficient documentation

## 2013-11-16 DIAGNOSIS — I509 Heart failure, unspecified: Secondary | ICD-10-CM | POA: Insufficient documentation

## 2013-11-16 DIAGNOSIS — F419 Anxiety disorder, unspecified: Secondary | ICD-10-CM | POA: Diagnosis not present

## 2013-11-16 DIAGNOSIS — E875 Hyperkalemia: Secondary | ICD-10-CM

## 2013-11-16 DIAGNOSIS — M1000 Idiopathic gout, unspecified site: Secondary | ICD-10-CM | POA: Insufficient documentation

## 2013-11-16 DIAGNOSIS — Z79899 Other long term (current) drug therapy: Secondary | ICD-10-CM | POA: Diagnosis not present

## 2013-11-16 DIAGNOSIS — E559 Vitamin D deficiency, unspecified: Secondary | ICD-10-CM

## 2013-11-16 DIAGNOSIS — D5700 Hb-SS disease with crisis, unspecified: Secondary | ICD-10-CM

## 2013-11-16 LAB — BASIC METABOLIC PANEL
Anion gap: 11 (ref 5–15)
BUN: 27 mg/dL — AB (ref 6–23)
CHLORIDE: 104 meq/L (ref 96–112)
CO2: 18 mEq/L — ABNORMAL LOW (ref 19–32)
Calcium: 8 mg/dL — ABNORMAL LOW (ref 8.4–10.5)
Creatinine, Ser: 1.02 mg/dL (ref 0.50–1.35)
GFR calc Af Amer: >90 mL/min (ref >90)
GFR, EST NON AFRICAN AMERICAN: 89 mL/min — AB (ref >90)
GLUCOSE: 97 mg/dL (ref 70–99)
Potassium: 5.3 mEq/L (ref 3.7–5.3)
Sodium: 133 mEq/L — ABNORMAL LOW (ref 137–147)

## 2013-11-16 LAB — RETICULOCYTES
RBC.: 1.85 MIL/uL — AB (ref 4.22–5.81)
Retic Ct Pct: >23 % — ABNORMAL HIGH (ref 0.4–3.1)

## 2013-11-16 LAB — CBC WITH DIFFERENTIAL/PLATELET
BASOS ABS: 0.3 10*3/uL — AB (ref 0.0–0.1)
Basophils Relative: 2 % — ABNORMAL HIGH (ref 0–1)
Eosinophils Absolute: 0.3 10*3/uL (ref 0.0–0.7)
Eosinophils Relative: 2 % (ref 0–5)
HCT: 17.7 % — ABNORMAL LOW (ref 39.0–52.0)
Hemoglobin: 6.3 g/dL — CL (ref 13.0–17.0)
LYMPHS ABS: 5.3 10*3/uL — AB (ref 0.7–4.0)
Lymphocytes Relative: 32 % (ref 12–46)
MCH: 34.1 pg — ABNORMAL HIGH (ref 26.0–34.0)
MCHC: 35.6 g/dL (ref 30.0–36.0)
MCV: 95.7 fL (ref 78.0–100.0)
MONO ABS: 2.8 10*3/uL — AB (ref 0.1–1.0)
Monocytes Relative: 17 % — ABNORMAL HIGH (ref 3–12)
NEUTROS PCT: 47 % (ref 43–77)
Neutro Abs: 7.9 10*3/uL — ABNORMAL HIGH (ref 1.7–7.7)
PLATELETS: 290 10*3/uL (ref 150–400)
RBC: 1.85 MIL/uL — ABNORMAL LOW (ref 4.22–5.81)
RDW: 34.1 % — AB (ref 11.5–15.5)
WBC: 16.6 10*3/uL — ABNORMAL HIGH (ref 4.0–10.5)
nRBC: 37 /100 WBC — ABNORMAL HIGH

## 2013-11-16 LAB — MAGNESIUM: MAGNESIUM: 2.4 mg/dL (ref 1.5–2.5)

## 2013-11-16 LAB — PROTIME-INR
INR: 1.52 — AB (ref 0.00–1.49)
Prothrombin Time: 18.4 seconds — ABNORMAL HIGH (ref 11.6–15.2)

## 2013-11-16 MED ORDER — ONDANSETRON HCL 4 MG/2ML IJ SOLN
4.0000 mg | INTRAMUSCULAR | Status: DC | PRN
  Administered 2013-11-16: 4 mg via INTRAVENOUS
  Filled 2013-11-16: qty 2

## 2013-11-16 MED ORDER — DIPHENHYDRAMINE HCL 50 MG/ML IJ SOLN
12.5000 mg | INTRAMUSCULAR | Status: DC | PRN
  Administered 2013-11-16: 12.5 mg via INTRAVENOUS
  Filled 2013-11-16: qty 1

## 2013-11-16 MED ORDER — HYDROMORPHONE HCL 2 MG/ML IJ SOLN
2.0000 mg | INTRAMUSCULAR | Status: AC | PRN
  Administered 2013-11-16 (×2): 2 mg via INTRAVENOUS
  Filled 2013-11-16 (×2): qty 1

## 2013-11-16 MED ORDER — ERGOCALCIFEROL 1.25 MG (50000 UT) PO CAPS: 50000.0000 [IU] | ORAL_CAPSULE | ORAL | Status: AC

## 2013-11-16 MED ORDER — SODIUM CHLORIDE 0.9 % IV SOLN
1000.0000 mL | Freq: Once | INTRAVENOUS | Status: AC
  Administered 2013-11-16: 1000 mL via INTRAVENOUS

## 2013-11-16 MED ORDER — SODIUM POLYSTYRENE SULFONATE 15 GM/60ML PO SUSP: 30.0000 g | Freq: Once | ORAL | Status: DC

## 2013-11-16 MED ORDER — SODIUM CHLORIDE 0.9 % IV SOLN
1000.0000 mL | INTRAVENOUS | Status: DC
  Administered 2013-11-16: 1000 mL via INTRAVENOUS

## 2013-11-16 NOTE — ED Provider Notes (Signed)
CSN: [id number]     Arrival date & time 11/16/13  1824 History   First MD Initiated Contact with Patient 11/16/13 1834     Chief Complaint  Patient presents with  . high Potassium   . Sickle Cell Pain Crisis    HPI Pt has history of sickle cell disease.  He has been having a crisis that he is managing at home.  The pain persisted this week so he went to the doctor on Monday.  He was seen at the clinic and had lab testing.  The blood tests showed that his potassium was elevated.  He was notified of the result this evening.  Pt was told to come to the ED.  No trouble urinating.  He has had some constipation.  No vomiting or diarrhea.  No fevers.  The pain with his sickle cell crisis is in the chest and elbow. Past Medical History  Diagnosis Date  . CHF (congestive heart failure)   . Gouty arthropathy   . Chronic fatigue syndrome   . Sickle cell anemia   . Anxiety   . Depression   . Hand joint pain 10/19/2012  . Hyperkalemia 03/25/2011   Past Surgical History  Procedure Laterality Date  . Appendectomy    . Cholecystectomy    . Splenectomy     Family History  Problem Relation Age of Onset  . Sickle cell anemia    . Sickle cell trait Mother    History  Substance Use Topics  . Smoking status: Never Smoker   . Smokeless tobacco: Never Used  . Alcohol Use: No    Review of Systems  All other systems reviewed and are negative.     Allergies  Toradol  Home Medications   Prior to Admission medications   Medication Sig Start Date End Date Taking? Authorizing Provider  allopurinol (ZYLOPRIM) 300 MG tablet Take 1 tablet (300 mg total) by mouth daily. 11/14/13  Yes Massie Maroon, FNP  ALPRAZolam Prudy Feeler) 0.25 MG tablet Take 0.25 mg by mouth at bedtime as needed for anxiety (anxiety).   Yes Historical Provider, MD  folic acid (FOLVITE) 1 MG tablet Take 1 tablet (1 mg total) by mouth daily. 04/21/13  Yes Altha Harm, MD  HYDROmorphone (DILAUDID) 4 MG tablet Take 4 mg by  mouth every 4 (four) hours as needed for severe pain (pain).   Yes Historical Provider, MD  ibuprofen (ADVIL,MOTRIN) 200 MG tablet Take 400 mg by mouth every 6 (six) hours as needed for fever or moderate pain (pain & fever).   Yes Historical Provider, MD  ibuprofen (ADVIL,MOTRIN) 800 MG tablet Take 800 mg by mouth every 8 (eight) hours as needed for fever or moderate pain (pain & fever).   Yes Historical Provider, MD  methadone (DOLOPHINE) 10 MG tablet Take 1 tablet (10 mg total) by mouth at bedtime. 11/14/13  Yes Massie Maroon, FNP  metoprolol (LOPRESSOR) 50 MG tablet Take 1 tablet (50 mg total) by mouth daily. 09/13/13  Yes Altha Harm, MD  zolpidem (AMBIEN) 10 MG tablet Take 10 mg by mouth at bedtime as needed for sleep (insomnia).   Yes Historical Provider, MD  ergocalciferol (DRISDOL) 50000 UNITS capsule Take 1 capsule (50,000 Units total) by mouth once a week. 11/16/13   Massie Maroon, FNP  sodium polystyrene (KAYEXALATE) 15 GM/60ML suspension Take 120 mLs (30 g total) by mouth once. 11/16/13   Massie Maroon, FNP   BP 131/65  Pulse 91  Temp(Src)  98.6 F (37 C) (Oral)  Resp 21  SpO2 91% Physical Exam  Nursing note and vitals reviewed. Constitutional: He appears well-developed and well-nourished. No distress.  HENT:  Head: Normocephalic and atraumatic.  Right Ear: External ear normal.  Left Ear: External ear normal.  Eyes: Conjunctivae are normal. Right eye exhibits no discharge. Left eye exhibits no discharge. No scleral icterus.  Neck: Neck supple. No tracheal deviation present.  Cardiovascular: Normal rate, regular rhythm and intact distal pulses.   Pulmonary/Chest: Effort normal and breath sounds normal. No stridor. No respiratory distress. He has no wheezes. He has no rales.  Abdominal: Soft. Bowel sounds are normal. He exhibits no distension. There is no tenderness. There is no rebound and no guarding.  Musculoskeletal: He exhibits no edema and no tenderness.   Neurological: He is alert. He has normal strength. No cranial nerve deficit (no facial droop, extraocular movements intact, no slurred speech) or sensory deficit. He exhibits normal muscle tone. He displays no seizure activity. Coordination normal.  Skin: Skin is warm and dry. No rash noted.  Psychiatric: He has a normal mood and affect.    ED Course  Procedures (including critical care time) Labs Review Labs Reviewed  CBC WITH DIFFERENTIAL - Abnormal; Notable for the following:    WBC 16.6 (*)    RBC 1.85 (*)    Hemoglobin 6.3 (*)    HCT 17.7 (*)    MCH 34.1 (*)    RDW 34.1 (*)    Monocytes Relative 17 (*)    Basophils Relative 2 (*)    nRBC 37 (*)    Neutro Abs 7.9 (*)    Lymphs Abs 5.3 (*)    Monocytes Absolute 2.8 (*)    Basophils Absolute 0.3 (*)    All other components within normal limits  BASIC METABOLIC PANEL - Abnormal; Notable for the following:    Sodium 133 (*)    CO2 18 (*)    BUN 27 (*)    Calcium 8.0 (*)    GFR calc non Af Amer 89 (*)    All other components within normal limits  RETICULOCYTES - Abnormal; Notable for the following:    Retic Ct Pct >23.0 (*)    RBC. 1.85 (*)    All other components within normal limits  PROTIME-INR - Abnormal; Notable for the following:    Prothrombin Time 18.4 (*)    INR 1.52 (*)    All other components within normal limits    Imaging Review Dg Chest 2 View  11/16/2013   CLINICAL DATA:  Chest pain today, sickle cell crisis  EXAM: CHEST  2 VIEW  COMPARISON:  07/06/2013  FINDINGS: Cardiomediastinal silhouette is stable. No acute infiltrate or pleural effusion. No pulmonary edema. Stable chronic blunting of the right costophrenic angle. Bony thorax is unremarkable.  IMPRESSION: No active disease. Stable chronic blunting of the right costophrenic angle.   Electronically Signed   By: Natasha MeadLiviu  Pop M.D.   On: 11/16/2013 20:25     EKG Interpretation   Date/Time:  Wednesday November 16 2013 18:40:32 EDT Ventricular Rate:   83 PR Interval:  160 QRS Duration: 96 QT Interval:  403 QTC Calculation: 473 R Axis:   -37 Text Interpretation:  Sinus rhythm LVH with secondary repolarization  abnormality No significant change since last tracing Confirmed by Geanie Pacifico   MD-J, Jamori Biggar ([id number]) on 11/16/2013 6:49:22 PM     Medications  0.9 %  sodium chloride infusion (1,000 mLs Intravenous New Bag/Given 11/16/13 1925)  Followed by  0.9 %  sodium chloride infusion (1,000 mLs Intravenous New Bag/Given 11/16/13 1925)  ondansetron (ZOFRAN) injection 4 mg (4 mg Intravenous Given 11/16/13 1926)  diphenhydrAMINE (BENADRYL) injection 12.5 mg (12.5 mg Intravenous Given 11/16/13 1925)  HYDROmorphone (DILAUDID) injection 2 mg (2 mg Intravenous Given 11/16/13 2033)    MDM   Final diagnoses:  Sickle cell anemia with crisis    The patient improved with treatment in the emergency department. His hemoglobin is 6.3 however this is  similar to his baseline. Hyperkalemia that was noted as an outpatient has resolved.  I spoke with Dr. Ashley RoyaltyMatthews who manages him in the sickle cell clinic. They can follow up on his hemoglobin and give him a transfusion if needed.  Patient's pain improved with treatment. He is comfortable going home.    Linwood DibblesJon Elisheba Mcdonnell, MD 11/16/13 2114

## 2013-11-16 NOTE — Discharge Instructions (Signed)
Sickle Cell Anemia, Adult °Sickle cell anemia is a condition in which red blood cells have an abnormal "sickle" shape. This abnormal shape shortens the cells' life span, which results in a lower than normal concentration of red blood cells in the blood. The sickle shape also causes the cells to clump together and block free blood flow through the blood vessels. As a result, the tissues and organs of the body do not receive enough oxygen. Sickle cell anemia causes organ damage and pain and increases the risk of infection. °CAUSES  °Sickle cell anemia is a genetic disorder. Those who receive two copies of the gene have the condition, and those who receive one copy have the trait. °RISK FACTORS °The sickle cell gene is most common in people whose families originated in [place]. Other areas of the globe where sickle cell trait occurs include the Mediterranean, [place] and [place], the [place], and the [place].  °SIGNS AND SYMPTOMS °· Pain, especially in the extremities, back, chest, or abdomen (common). The pain may start suddenly or may develop following an illness, especially if there is dehydration. Pain can also occur due to overexertion or exposure to extreme temperature changes. °· Frequent severe bacterial infections, especially certain types of pneumonia and meningitis. °· Pain and swelling in the hands and feet. °· Decreased activity.   °· Loss of appetite.   °· Change in behavior. °· Headaches. °· Seizures. °· Shortness of breath or difficulty breathing. °· Vision changes. °· Skin ulcers. °Those with the trait may not have symptoms or they may have mild symptoms.  °DIAGNOSIS  °Sickle cell anemia is diagnosed with blood tests that demonstrate the genetic trait. It is often diagnosed during the newborn period, due to mandatory testing nationwide. A variety of blood tests, X-rays, CT scans, MRI scans, ultrasounds, and lung function tests may also be done to monitor the condition. °TREATMENT  °Sickle  cell anemia may be treated with: °· Medicines. You may be given pain medicines, antibiotic medicines (to treat and prevent infections) or medicines to increase the production of certain types of hemoglobin. °· Fluids. °· Oxygen. °· Blood transfusions. °HOME CARE INSTRUCTIONS  °· Drink enough fluid to keep your urine clear or pale yellow. Increase your fluid intake in hot weather and during exercise. °· Do not smoke. Smoking lowers oxygen levels in the blood.   °· Only take over-the-counter or prescription medicines for pain, fever, or discomfort as directed by your health care provider. °· Take antibiotics as directed by your health care provider. Make sure you finish them it even if you start to feel better.   °· Take supplements as directed by your health care provider.   °· Consider wearing a medical alert bracelet. This tells anyone caring for you in an emergency of your condition.   °· When traveling, keep your medical information, health care provider's names, and the medicines you take with you at all times.   °· If you develop a fever, do not take medicines to reduce the fever right away. This could cover up a problem that is developing. Notify your health care provider. °· Keep all follow-up appointments with your health care provider. Sickle cell anemia requires regular medical care. °SEEK MEDICAL CARE IF: ° You have a fever. °SEEK IMMEDIATE MEDICAL CARE IF:  °· You feel dizzy or faint.   °· You have new abdominal pain, especially on the left side near the stomach area.   °· You develop a persistent, often uncomfortable and painful penile erection (priapism). If this is not treated immediately it   will lead to impotence.   °· You have numbness your arms or legs or you have a hard time moving them.   °· You have a hard time with speech.   °· You have a fever or persistent symptoms for more than 2-3 days.   °· You have a fever and your symptoms suddenly get worse.   °· You have signs or symptoms of infection.  These include:   °¨ Chills.   °¨ Abnormal tiredness (lethargy).   °¨ Irritability.   °¨ Poor eating.   °¨ Vomiting.   °· You develop pain that is not helped with medicine.   °· You develop shortness of breath. °· You have pain in your chest.   °· You are coughing up pus-like or bloody sputum.   °· You develop a stiff neck. °· Your feet or hands swell or have pain. °· Your abdomen appears bloated. °· You develop joint pain. °MAKE SURE YOU: °· Understand these instructions. °Document Released: 04/30/2005 Document Revised: 06/06/2013 Document Reviewed: 09/01/2012 °ExitCare® Patient Information ©2015 [employer]. This information is not intended to replace advice given to you by your health care provider. Make sure you discuss any questions you have with your health care provider. ° °

## 2013-11-16 NOTE — Telephone Encounter (Signed)
Pt called to inquire about instructions for getting a CMP drawn today; notified pt that, per NP Hart RochesterHollis' note today, [facility] drawn today and he has a prescription awaiting at his pharmacy for pick-up; pt verbalizes understanding

## 2013-11-16 NOTE — Telephone Encounter (Signed)
Marchelle FolksAmanda with [facility] called to advised of stat magnesium level of 2.4. Thanks!

## 2013-11-16 NOTE — ED Notes (Signed)
Pt has been treating sickle cell crisis at home but went to his PCP appt today and was called stating that he needed to come to ED for hyperkalemia.  Pt's pain is mid chest.

## 2013-11-16 NOTE — Progress Notes (Unsigned)
Notified patient, p

## 2013-11-16 NOTE — Progress Notes (Signed)
[name], a patient with sickle cell anemia, HbSS  currently has a critical potassium level of 6.4. Patient [facility] to have a repeat stat complete metabolic panel drawn. Will notify patient with further instruction when stat test results become available.   Meds ordered this encounter  Medications  . sodium polystyrene (KAYEXALATE) 15 GM/60ML suspension    Sig: Take 120 mLs (30 g total) by mouth once.    Dispense:  120 mL    Refill:  1    Order Specific Question:  Supervising Provider    Answer:  MATTHEWS, MICHELLE A [[id number]]  Patient to take Kayexalate 15 gm/60 ml suspension.  Massie MaroonHollis,Mercedies Ganesh M, FNP

## 2013-11-16 NOTE — Telephone Encounter (Signed)
Patient was unable to report to [facility] draw station per previous note.Dr. Ashley RoyaltyMatthews, primary physician informed about laboratory values, she states that patient needs to report to ER.  Spoke with Dr. Lynelle DoctorKnapp, ED physician to inform him of patient's critical laboratory value.     Massie MaroonHollis,Sumaiyah Markert M, FNP Pager: [phone]

## 2013-11-23 ENCOUNTER — Ambulatory Visit (INDEPENDENT_AMBULATORY_CARE_PROVIDER_SITE_OTHER): Payer: BC Managed Care – PPO | Admitting: Family Medicine

## 2013-11-23 ENCOUNTER — Ambulatory Visit (HOSPITAL_COMMUNITY)
Admission: RE | Admit: 2013-11-23 | Discharge: 2013-11-23 | Disposition: A | Discharge status: Home / Self Care | Payer: BC Managed Care – PPO | Source: Ambulatory Visit | Attending: Family Medicine | Admitting: Family Medicine

## 2013-11-23 ENCOUNTER — Encounter: Payer: Self-pay | Admitting: Family Medicine

## 2013-11-23 ENCOUNTER — Inpatient Hospital Stay (HOSPITAL_COMMUNITY)
Admission: AD | Admit: 2013-11-23 | Discharge: 2013-11-28 | DRG: 193 | Disposition: A | Discharge status: Home / Self Care | Payer: BC Managed Care – PPO | Source: Ambulatory Visit | Attending: Internal Medicine | Admitting: Internal Medicine

## 2013-11-23 ENCOUNTER — Telehealth (HOSPITAL_COMMUNITY): Payer: Self-pay | Admitting: *Deleted

## 2013-11-23 ENCOUNTER — Encounter (HOSPITAL_COMMUNITY): Payer: Self-pay | Admitting: *Deleted

## 2013-11-23 VITALS — BP 137/70 | HR 91 | Temp 98.7°F | Resp 18 | Ht 71.0 in | Wt 175.0 lb

## 2013-11-23 DIAGNOSIS — F411 Generalized anxiety disorder: Secondary | ICD-10-CM | POA: Diagnosis present

## 2013-11-23 DIAGNOSIS — G894 Chronic pain syndrome: Secondary | ICD-10-CM | POA: Diagnosis present

## 2013-11-23 DIAGNOSIS — D571 Sickle-cell disease without crisis: Secondary | ICD-10-CM

## 2013-11-23 DIAGNOSIS — R0602 Shortness of breath: Secondary | ICD-10-CM

## 2013-11-23 DIAGNOSIS — I5032 Chronic diastolic (congestive) heart failure: Secondary | ICD-10-CM | POA: Diagnosis present

## 2013-11-23 DIAGNOSIS — R059 Cough, unspecified: Secondary | ICD-10-CM

## 2013-11-23 DIAGNOSIS — I471 Supraventricular tachycardia, unspecified: Secondary | ICD-10-CM | POA: Diagnosis present

## 2013-11-23 DIAGNOSIS — J9811 Atelectasis: Secondary | ICD-10-CM | POA: Diagnosis present

## 2013-11-23 DIAGNOSIS — R058 Other specified cough: Secondary | ICD-10-CM

## 2013-11-23 DIAGNOSIS — F329 Major depressive disorder, single episode, unspecified: Secondary | ICD-10-CM | POA: Diagnosis present

## 2013-11-23 DIAGNOSIS — N2589 Other disorders resulting from impaired renal tubular function: Secondary | ICD-10-CM | POA: Diagnosis present

## 2013-11-23 DIAGNOSIS — Z888 Allergy status to other drugs, medicaments and biological substances status: Secondary | ICD-10-CM | POA: Diagnosis not present

## 2013-11-23 DIAGNOSIS — R05 Cough: Secondary | ICD-10-CM

## 2013-11-23 DIAGNOSIS — D5700 Hb-SS disease with crisis, unspecified: Secondary | ICD-10-CM | POA: Diagnosis present

## 2013-11-23 DIAGNOSIS — D72829 Elevated white blood cell count, unspecified: Secondary | ICD-10-CM | POA: Diagnosis present

## 2013-11-23 DIAGNOSIS — R17 Unspecified jaundice: Secondary | ICD-10-CM | POA: Insufficient documentation

## 2013-11-23 DIAGNOSIS — E559 Vitamin D deficiency, unspecified: Secondary | ICD-10-CM | POA: Diagnosis present

## 2013-11-23 DIAGNOSIS — R160 Hepatomegaly, not elsewhere classified: Secondary | ICD-10-CM | POA: Diagnosis present

## 2013-11-23 DIAGNOSIS — E875 Hyperkalemia: Secondary | ICD-10-CM

## 2013-11-23 DIAGNOSIS — J189 Pneumonia, unspecified organism: Secondary | ICD-10-CM | POA: Diagnosis present

## 2013-11-23 DIAGNOSIS — J9601 Acute respiratory failure with hypoxia: Secondary | ICD-10-CM | POA: Diagnosis present

## 2013-11-23 DIAGNOSIS — Z79899 Other long term (current) drug therapy: Secondary | ICD-10-CM | POA: Diagnosis not present

## 2013-11-23 DIAGNOSIS — E860 Dehydration: Secondary | ICD-10-CM

## 2013-11-23 DIAGNOSIS — R10815 Periumbilic abdominal tenderness: Secondary | ICD-10-CM

## 2013-11-23 DIAGNOSIS — R6883 Chills (without fever): Secondary | ICD-10-CM

## 2013-11-23 DIAGNOSIS — M109 Gout, unspecified: Secondary | ICD-10-CM | POA: Diagnosis present

## 2013-11-23 HISTORY — DX: Supraventricular tachycardia, unspecified: I47.10

## 2013-11-23 HISTORY — DX: Other disorders resulting from impaired renal tubular function: N25.89

## 2013-11-23 HISTORY — DX: Supraventricular tachycardia: I47.1

## 2013-11-23 LAB — CBC WITH DIFFERENTIAL/PLATELET
BASOS ABS: 0 10*3/uL (ref 0.0–0.1)
Basophils Relative: 0 % (ref 0–1)
EOS ABS: 0.3 10*3/uL (ref 0.0–0.7)
Eosinophils Relative: 2 % (ref 0–5)
HCT: 18.5 % — ABNORMAL LOW (ref 39.0–52.0)
Hemoglobin: 6.3 g/dL — CL (ref 13.0–17.0)
LYMPHS PCT: 28 % (ref 12–46)
Lymphs Abs: 4 10*3/uL (ref 0.7–4.0)
MCH: 35.8 pg — AB (ref 26.0–34.0)
MCHC: 34.1 g/dL (ref 30.0–36.0)
MCV: 105.1 fL — ABNORMAL HIGH (ref 78.0–100.0)
MONO ABS: 3 10*3/uL — AB (ref 0.1–1.0)
Monocytes Relative: 21 % — ABNORMAL HIGH (ref 3–12)
NEUTROS PCT: 49 % (ref 43–77)
NRBC: 148 /100{WBCs} — AB
Neutro Abs: 7 10*3/uL (ref 1.7–7.7)
Platelets: 231 10*3/uL (ref 150–400)
RBC: 1.76 MIL/uL — ABNORMAL LOW (ref 4.22–5.81)
WBC: 14.3 10*3/uL — ABNORMAL HIGH (ref 4.0–10.5)

## 2013-11-23 LAB — COMPREHENSIVE METABOLIC PANEL
ALBUMIN: 2.5 g/dL — AB (ref 3.5–5.2)
ALK PHOS: 86 U/L (ref 39–117)
ALT: 43 U/L (ref 0–53)
AST: 118 U/L — AB (ref 0–37)
Anion gap: 11 (ref 5–15)
BUN: 19 mg/dL (ref 6–23)
CALCIUM: 7.8 mg/dL — AB (ref 8.4–10.5)
CO2: 18 mEq/L — ABNORMAL LOW (ref 19–32)
Chloride: 112 mEq/L (ref 96–112)
Creatinine, Ser: 0.88 mg/dL (ref 0.50–1.35)
GFR calc non Af Amer: >90 mL/min (ref >90)
Glucose, Bld: 91 mg/dL (ref 70–99)
POTASSIUM: 4.3 meq/L (ref 3.7–5.3)
Sodium: 141 mEq/L (ref 137–147)
Total Bilirubin: 5.7 mg/dL — ABNORMAL HIGH (ref 0.3–1.2)
Total Protein: 7.3 g/dL (ref 6.0–8.3)

## 2013-11-23 LAB — RETICULOCYTES: Retic Ct Pct: >23 % — ABNORMAL HIGH (ref 0.4–3.1)

## 2013-11-23 LAB — POCT INFLUENZA A/B
INFLUENZA A, POC: NEGATIVE
Influenza B, POC: NEGATIVE

## 2013-11-23 LAB — TROPONIN I

## 2013-11-23 LAB — PRO B NATRIURETIC PEPTIDE: PRO B NATRI PEPTIDE: 892 pg/mL — AB (ref 0–125)

## 2013-11-23 MED ORDER — HYDROMORPHONE 2 MG/ML HIGH CONCENTRATION IV PCA SOLN: INTRAVENOUS | Status: DC

## 2013-11-23 MED ORDER — DEXTROSE 5 % IV SOLN
2.0000 g | INTRAVENOUS | Status: DC
  Filled 2013-11-23: qty 2

## 2013-11-23 MED ORDER — CEFTRIAXONE SODIUM 2 G IJ SOLR
2.0000 g | INTRAMUSCULAR | Status: DC
  Administered 2013-11-24 – 2013-11-25 (×2): 2 g via INTRAVENOUS
  Filled 2013-11-23 (×4): qty 2

## 2013-11-23 MED ORDER — FUROSEMIDE 10 MG/ML IJ SOLN
40.0000 mg | Freq: Once | INTRAMUSCULAR | Status: AC
  Administered 2013-11-23: 40 mg via INTRAVENOUS
  Filled 2013-11-23: qty 4

## 2013-11-23 MED ORDER — VITAMIN D (ERGOCALCIFEROL) 1.25 MG (50000 UNIT) PO CAPS
50000.0000 [IU] | ORAL_CAPSULE | ORAL | Status: DC
  Administered 2013-11-23: 50000 [IU] via ORAL
  Filled 2013-11-23: qty 1

## 2013-11-23 MED ORDER — DIPHENHYDRAMINE HCL 12.5 MG/5ML PO ELIX: 12.5000 mg | ORAL_SOLUTION | Freq: Four times a day (QID) | ORAL | Status: DC | PRN

## 2013-11-23 MED ORDER — SODIUM BICARBONATE 650 MG PO TABS
650.0000 mg | ORAL_TABLET | Freq: Two times a day (BID) | ORAL | Status: DC
  Administered 2013-11-23 – 2013-11-28 (×10): 650 mg via ORAL
  Filled 2013-11-23 (×10): qty 1

## 2013-11-23 MED ORDER — ALLOPURINOL 300 MG PO TABS
300.0000 mg | ORAL_TABLET | Freq: Every day | ORAL | Status: DC
  Administered 2013-11-23 – 2013-11-28 (×6): 300 mg via ORAL
  Filled 2013-11-23 (×6): qty 1

## 2013-11-23 MED ORDER — METOPROLOL TARTRATE 50 MG PO TABS
50.0000 mg | ORAL_TABLET | Freq: Every day | ORAL | Status: DC
  Administered 2013-11-23 – 2013-11-28 (×6): 50 mg via ORAL
  Filled 2013-11-23 (×6): qty 1

## 2013-11-23 MED ORDER — NALOXONE HCL 0.4 MG/ML IJ SOLN
0.4000 mg | INTRAMUSCULAR | Status: DC | PRN
Start: 2013-11-23 — End: 2013-11-23

## 2013-11-23 MED ORDER — DEXTROSE 5 % IV SOLN
500.0000 mg | Freq: Once | INTRAVENOUS | Status: AC
  Administered 2013-11-23: 500 mg via INTRAVENOUS
  Filled 2013-11-23: qty 500

## 2013-11-23 MED ORDER — DEXTROSE 5 % IV SOLN
1.0000 g | INTRAVENOUS | Status: DC
  Administered 2013-11-23: 1 g via INTRAVENOUS
  Filled 2013-11-23: qty 10

## 2013-11-23 MED ORDER — HYDROMORPHONE HCL 2 MG/ML IJ SOLN
2.0000 mg | Freq: Once | INTRAMUSCULAR | Status: AC
  Administered 2013-11-23: 2 mg via INTRAVENOUS
  Filled 2013-11-23: qty 1

## 2013-11-23 MED ORDER — HYDROMORPHONE 2 MG/ML HIGH CONCENTRATION IV PCA SOLN
INTRAVENOUS | Status: DC
Start: 2013-11-23 — End: 2013-11-23
  Filled 2013-11-23: qty 25

## 2013-11-23 MED ORDER — SODIUM CHLORIDE 0.9 % IJ SOLN: 9.0000 mL | INTRAMUSCULAR | Status: DC | PRN

## 2013-11-23 MED ORDER — DIPHENHYDRAMINE HCL 12.5 MG/5ML PO ELIX
12.5000 mg | ORAL_SOLUTION | Freq: Four times a day (QID) | ORAL | Status: DC | PRN
Start: 2013-11-23 — End: 2013-11-23

## 2013-11-23 MED ORDER — NALOXONE HCL 0.4 MG/ML IJ SOLN: 0.4000 mg | INTRAMUSCULAR | Status: DC | PRN

## 2013-11-23 MED ORDER — ONDANSETRON HCL 4 MG/2ML IJ SOLN
4.0000 mg | Freq: Four times a day (QID) | INTRAMUSCULAR | Status: DC | PRN
  Administered 2013-11-23: 4 mg via INTRAVENOUS
  Filled 2013-11-23: qty 2

## 2013-11-23 MED ORDER — DEXTROSE-NACL 5-0.45 % IV SOLN: INTRAVENOUS | Status: DC

## 2013-11-23 MED ORDER — FOLIC ACID 1 MG PO TABS
1.0000 mg | ORAL_TABLET | Freq: Every day | ORAL | Status: DC
  Filled 2013-11-23: qty 1

## 2013-11-23 MED ORDER — ENOXAPARIN SODIUM 40 MG/0.4ML ~~LOC~~ SOLN
40.0000 mg | SUBCUTANEOUS | Status: DC
  Administered 2013-11-23 – 2013-11-27 (×5): 40 mg via SUBCUTANEOUS
  Filled 2013-11-23 (×5): qty 0.4

## 2013-11-23 MED ORDER — HYDROMORPHONE 2 MG/ML HIGH CONCENTRATION IV PCA SOLN
INTRAVENOUS | Status: DC
  Administered 2013-11-23: 22:00:00 via INTRAVENOUS
  Administered 2013-11-24: 5.6 mL via INTRAVENOUS

## 2013-11-23 MED ORDER — METHADONE HCL 10 MG PO TABS
10.0000 mg | ORAL_TABLET | Freq: Every day | ORAL | Status: DC
  Administered 2013-11-23 – 2013-11-27 (×5): 10 mg via ORAL
  Filled 2013-11-23 (×5): qty 1

## 2013-11-23 MED ORDER — AZITHROMYCIN 500 MG PO TABS
500.0000 mg | ORAL_TABLET | Freq: Every day | ORAL | Status: DC
  Administered 2013-11-24 – 2013-11-28 (×5): 500 mg via ORAL
  Filled 2013-11-23 (×5): qty 1

## 2013-11-23 MED ORDER — PANTOPRAZOLE SODIUM 40 MG PO TBEC
40.0000 mg | DELAYED_RELEASE_TABLET | Freq: Every day | ORAL | Status: DC
  Administered 2013-11-23 – 2013-11-28 (×6): 40 mg via ORAL
  Filled 2013-11-23 (×7): qty 1

## 2013-11-23 MED ORDER — FOLIC ACID 1 MG PO TABS
1.0000 mg | ORAL_TABLET | Freq: Every day | ORAL | Status: DC
  Administered 2013-11-24 – 2013-11-28 (×5): 1 mg via ORAL
  Filled 2013-11-23 (×5): qty 1

## 2013-11-23 MED ORDER — ONDANSETRON HCL 4 MG/2ML IJ SOLN: 4.0000 mg | Freq: Four times a day (QID) | INTRAMUSCULAR | Status: DC | PRN

## 2013-11-23 MED ORDER — HYDROMORPHONE 0.3 MG/ML IV SOLN
INTRAVENOUS | Status: DC
  Filled 2013-11-23: qty 25

## 2013-11-23 MED ORDER — SODIUM CHLORIDE 0.9 % IV SOLN
12.5000 mg | Freq: Four times a day (QID) | INTRAVENOUS | Status: DC | PRN
  Filled 2013-11-23: qty 0.25

## 2013-11-23 MED ORDER — DIPHENHYDRAMINE HCL 50 MG/ML IJ SOLN: 12.5000 mg | Freq: Four times a day (QID) | INTRAMUSCULAR | Status: DC | PRN

## 2013-11-23 MED ORDER — ZOLPIDEM TARTRATE 10 MG PO TABS
10.0000 mg | ORAL_TABLET | Freq: Every day | ORAL | Status: DC
  Administered 2013-11-24 – 2013-11-27 (×4): 10 mg via ORAL
  Filled 2013-11-23 (×4): qty 1

## 2013-11-23 MED ORDER — POLYETHYLENE GLYCOL 3350 17 G PO PACK
17.0000 g | PACK | Freq: Every day | ORAL | Status: DC | PRN
  Administered 2013-11-24: 17 g via ORAL
  Filled 2013-11-23: qty 1

## 2013-11-23 MED ORDER — ERGOCALCIFEROL 1.25 MG (50000 UT) PO CAPS: 50000.0000 [IU] | ORAL_CAPSULE | ORAL | Status: DC

## 2013-11-23 MED ORDER — DIPHENHYDRAMINE HCL 50 MG/ML IJ SOLN
12.5000 mg | Freq: Four times a day (QID) | INTRAMUSCULAR | Status: DC | PRN
  Filled 2013-11-23: qty 0.25

## 2013-11-23 MED ORDER — DEXTROSE 5 % IV SOLN
1.0000 g | Freq: Once | INTRAVENOUS | Status: AC
  Administered 2013-11-24: 1 g via INTRAVENOUS
  Filled 2013-11-23: qty 10

## 2013-11-23 MED ORDER — DEXTROSE-NACL 5-0.45 % IV SOLN
INTRAVENOUS | Status: DC
  Administered 2013-11-23 (×2): via INTRAVENOUS

## 2013-11-23 MED ORDER — SENNOSIDES-DOCUSATE SODIUM 8.6-50 MG PO TABS
1.0000 | ORAL_TABLET | Freq: Two times a day (BID) | ORAL | Status: DC | PRN
  Administered 2013-11-23: 1 via ORAL
  Filled 2013-11-23 (×2): qty 1

## 2013-11-23 NOTE — Progress Notes (Signed)
Pt O2 sat while ambulating 88% on room air, HR 108.  O2 sat at rest 90% on room air, HR 87.  Pt placed on 2L via [place]. Pt tolerated well, O2 sat 96% on 2L at rest

## 2013-11-23 NOTE — Progress Notes (Signed)
Subjective:    Patient ID: [name], male    DOB: 01-12-1971, 43 y.o.   MRN: [id number]  HPI Patient with a history of sickle cell anemia, HbSS presents with history of fever, chills and productive cough for 4 days. He states that he has not attempted any OTC interventions to alleviate current symptoms. He states that productive cough and shortness of breath have been worsening over the past 4 days.    Patient complains of dyspnea, fever, myalgias, productive cough and sore throat.  Symptoms began 4 days ago.  The cough is productive of green/yellow sputum, with shortness of breath. Patient does not have a history of asthma or environmental allergies . Patient has not had recent travel. Patient does not have a history of smoking. Patient has not had a  previous Chest X-ray.   Mr. [name] is complaining of mild pain to upper extremities. Patient states that pain has been occurring intermittently over the past 3-4 weeks. He states that he has been managing sickle cell pain with prescribed medications and Ibuprofen as previously prescribed. He last had Oxycodone this am with moderate relief.   Past Medical History  Diagnosis Date  . CHF (congestive heart failure)   . Gouty arthropathy   . Chronic fatigue syndrome   . Sickle cell anemia   . Anxiety   . Depression   . Hand joint pain 10/19/2012  . Hyperkalemia 03/25/2011    Review of Systems  Constitutional: Positive for fever, chills, diaphoresis and fatigue. Negative for unexpected weight change.  HENT: Positive for sore throat. Negative for congestion and sinus pressure.   Eyes: Negative.   Respiratory: Positive for cough and shortness of breath. Negative for chest tightness and wheezing.   Cardiovascular: Negative.   Gastrointestinal: Negative.        Reports a "knot" around the umbilicus with coughing  Endocrine: Negative.   Genitourinary: Negative.   Musculoskeletal: Negative.   Skin: Negative.   Allergic/Immunologic:  Negative.   Neurological: Negative.  Negative for dizziness and weakness.  Hematological: Negative.   Psychiatric/Behavioral: Negative.        Objective:   Physical Exam  Constitutional: He appears well-developed and well-nourished. He has a sickly appearance. He appears distressed.  HENT:  Head: Normocephalic and atraumatic.  Right Ear: External ear normal.  Left Ear: External ear normal.  Mouth/Throat: Oropharynx is clear and moist.  Eyes: Conjunctivae are normal. Pupils are equal, round, and reactive to light.  Neck: Normal range of motion. Neck supple.  Cardiovascular: Normal rate, regular rhythm, normal heart sounds and intact distal pulses.   Pulmonary/Chest: Effort normal. No apnea. No respiratory distress. He has no decreased breath sounds. He has no wheezes. He has rhonchi in the right upper field and the left upper field.  Abdominal: Soft. Bowel sounds are normal. There is tenderness in the right upper quadrant. A hernia is present.           BP 137/70  Pulse 91  Temp(Src) 98.7 F (37.1 C) (Oral)  Resp 18  Ht 5\' 11"  (1.803 m)  Wt 175 lb (79.379 kg)  BMI 24.42 kg/m2 Assessment & Plan:  1. Shortness of breath Mr. [name] states that he is experiencing SOB with coughing. He states that he has had a productive cough since Saturday, 11/19/2013. Ambulating pulse oximetry 89%. Will send for stat chest x-ray.  DG Chest 2 View; Future Will check CBC and CMP   2. Hb-SS disease without crisis Patient states that he is having  upper extremity pain and mild sternal pain with coughing. He states that he last had Oxycodone this am with moderate relief.  Current pain intensity is 3/10  3. Chills Patient received influenza vaccination on 11/14/2013. He states that he started having flu-like symptoms on 11/19/2013 - Influenza A/B  4. Productive cough Patient states that he is having upper extremity pain and mild sternal pain with coughing. He states that he last had Oxycodone  this am with moderate relief.    5. Dehydration Patient is urinating, mucous membranes appear dry. Fluctuating tachycardia. He reports that he did not take Metoprolol today.  Will possibly schedule patient for fluids at the day infusion center  6. Hypoxia:  Mr. [name] is experiencing desaturation with ambulating pulse oximetry  7. Hyperkalemia Patient was examined in the emergency department for a potassium level of 6.4 one week ago. He reports that he did not start Kayexalate. Will check potassium level   10. Abdominal tenderness, periumbilical He reports abdominal tenderness after coughing. He states that a "knot" appears after a coughing spell. The periumbilical fullness was not present on inspection.    Massie MaroonHollis,Shenelle Klas M, FNP

## 2013-11-23 NOTE — Telephone Encounter (Signed)
Received patient call. Patient c/o not feeling well, bad cold with cough, a "knot" feeling to bottom of stomach, decreased energy, LBM 2-3 days, Sickle cell pain to mid-back rating 3/10 for weeks. Patient requesting to be seen, inquiring if may need "something for flu". This RN notified the NP of patient's complaints. Patient instructed to come to primary care office for acute work up appointment at 3:45 today. Patient acknowledges and agrees to appointment time.

## 2013-11-23 NOTE — H&P (Signed)
[facility] History and Physical  [name] WUX:[id number] DOB: 07-Apr-1970 DOA: 11/23/2013  Referring physician:  Dr. Ashley RoyaltyMatthews PCP:  MATTHEWS,MICHELLE A., MD   Chief Complaint:  Bad cold  HPI:  The patient is a 43 y.o. 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  The patient was last at their baseline health approximately one week ago.  He states he developed some diffuse body pains and started taking his lauded so he stopped taking his methadone. 2 days later, he developed rhinorrhea, sinus congestion, cough productive of green sputum, subjective fevers and chills. He also developed some nausea with diarrhea. His symptoms persisted for approximately 3 days and then his respiratory symptoms gradually improved. His diarrhea stopped and his last bowel movement was 2 days ago. His pain has been progressively worsening and his cough has persisted. He was seen in the sickle cell clinic today with 3/10 diffuse body aches. He had some rales on exam and his oxygen levels were low. He had a chest x-ray which demonstrated possible pneumonia versus interstitial fluid.  He has had increased lower extremity edema for several months. He denies orthopnea or PND. He is concerned about a bulging around his bellybutton when he coughs.  In clinic, his vital signs are stable except for oxygen saturation of 85% on room air. His chest x-ray demonstrates slight increased pulmonary interstitial densities consistent with possible interstitial edema versus atelectasis or pneumonia in the right infrahilar region. Labs were obtained in clinic and are pending. He was started on gentle IV fluids and ordered for ceftriaxone and azithromycin. He is being admitted for probable  community-acquired pneumonia.  Review of Systems:  General:  Positive subjective fevers, chills HEENT:  Denies changes to hearing and vision, positive rhinorrhea, sinus congestion, sore throat. Increase scleral icterus CV:  Positive chronic chest pain and increased palpitations PULM:  Positive SOB, cough.   denies wheezing  GI:   per history of present illness  GU:  Denies dysuria, frequency, urgency ENDO:  Denies polyuria, polydipsia.   HEME:  Denies hematemesis, blood in stools, melena LYMPH:  Denies lymphadenopathy.   MSK:  history of present illness .   DERM:  Denies skin rash or ulcer.   NEURO:  Denies focal numbness, weakness, slurred speech, confusion, facial droop.  PSYCH:  Denies anxiety and depression.    Past Medical History  Diagnosis Date  . CHF (congestive heart failure)   . Gouty arthropathy   . Chronic fatigue syndrome   . Sickle cell anemia   . Anxiety   . Depression   . Hand joint pain 10/19/2012  . Hyperkalemia 03/25/2011  . SVT (supraventricular tachycardia)   . Renal tubular acidosis    Past Surgical History  Procedure Laterality Date  . Appendectomy    . Cholecystectomy    . Splenectomy     Social History:  reports that he has never smoked. He has never used smokeless tobacco. He reports that he does not drink alcohol or use illicit drugs. Works in a [employer].  Estranged from his biological father.    Allergies  Allergen Reactions  . Toradol [Ketorolac Tromethamine]     Pt stated "it scars my kidneys"     Family History  Problem Relation Age of Onset  . Sickle cell anemia    . Sickle cell  trait Mother      Prior to Admission medications   Medication Sig Start Date End Date Taking? Authorizing Provider  allopurinol (ZYLOPRIM) 300 MG tablet Take 1 tablet (300 mg total) by mouth daily. 11/14/13   Massie Maroon, FNP  ALPRAZolam Prudy Feeler) 0.25 MG tablet Take 0.25 mg by mouth at bedtime as needed for anxiety (anxiety).    Historical Provider, MD   ergocalciferol (DRISDOL) 50000 UNITS capsule Take 1 capsule (50,000 Units total) by mouth once a week. 11/16/13   Massie Maroon, FNP  folic acid (FOLVITE) 1 MG tablet Take 1 tablet (1 mg total) by mouth daily. 04/21/13   Altha Harm, MD  HYDROmorphone (DILAUDID) 4 MG tablet Take 4 mg by mouth every 4 (four) hours as needed for severe pain (pain).    Historical Provider, MD  ibuprofen (ADVIL,MOTRIN) 200 MG tablet Take 400 mg by mouth every 6 (six) hours as needed for fever or moderate pain (pain & fever).    Historical Provider, MD  ibuprofen (ADVIL,MOTRIN) 800 MG tablet Take 800 mg by mouth every 8 (eight) hours as needed for fever or moderate pain (pain & fever).    Historical Provider, MD  methadone (DOLOPHINE) 10 MG tablet Take 1 tablet (10 mg total) by mouth at bedtime. 11/14/13   Massie Maroon, FNP  metoprolol (LOPRESSOR) 50 MG tablet Take 1 tablet (50 mg total) by mouth daily. 09/13/13   Altha Harm, MD  sodium polystyrene (KAYEXALATE) 15 GM/60ML suspension Take 120 mLs (30 g total) by mouth once. 11/16/13   Massie Maroon, FNP  zolpidem (AMBIEN) 10 MG tablet Take 10 mg by mouth at bedtime as needed for sleep (insomnia).    Historical Provider, MD   Physical Exam: Filed Vitals:   11/23/13 1635 11/23/13 1637 11/23/13 1639 11/23/13 1831  BP:   123/60 118/56  Pulse:    89  Temp:   99.6 F (37.6 C) 98.7 F (37.1 C)  TempSrc:   Oral Oral  Resp:    18  Height:    5\' 11"  (1.803 m)  Weight:    79.379 kg (175 lb)  SpO2: 85% 90% 96% 91%     General:  BM, NAD  Eyes:  PERRL, non-injected, positive scleral icterus  ENT:  Nares clear.  OP clear, non-erythematous without plaques or exudates.  MMM.  Neck:  Supple without TM or JVD.    Lymph:  No cervical, supraclavicular, or submandibular LAD.  Cardiovascular:  RRR, normal S1, S2, without  2/6 systolic murmur at the left sternal border and positive heave .  2+ pulses, warm extremities  Respiratory:   diminished at  the right lower lobe with rales at the bilateral bases, no wheezes or rhonchi, without increased WOB.  Abdomen:  NABS.  Soft, ND/NT.   positive hepatomegaly, marked on abdomen   Skin:  No rashes or focal lesions.  Musculoskeletal:  Normal bulk and tone.   2+ bilateral pitting  LE edema.  Psychiatric:  A & O x 4.  Appropriate affect.  Neurologic:  CN 3-12 intact.  5/5 strength.  Sensation intact.  Labs on Admission:  Basic Metabolic Panel:  Recent Labs Lab 11/23/13 1805  NA 141  K 4.3  CL 112  CO2 18*  GLUCOSE 91  BUN 19  CREATININE 0.88  CALCIUM 7.8*   Liver Function Tests:  Recent Labs Lab 11/23/13 1805  AST 118*  ALT 43  ALKPHOS 86  BILITOT 5.7*  PROT 7.3  ALBUMIN  2.5*   No results found for this basename: LIPASE, AMYLASE,  in the last 168 hours No results found for this basename: AMMONIA,  in the last 168 hours CBC:  Recent Labs Lab 11/23/13 1805  WBC 14.3*  NEUTROABS PENDING  HGB 6.3*  HCT 18.5*  MCV 105.1*  PLT 231   Cardiac Enzymes: No results found for this basename: CKTOTAL, CKMB, CKMBINDEX, TROPONINI,  in the last 168 hours  BNP (last 3 results)  Recent Labs  07/06/13 1755  PROBNP 482.9*   CBG: No results found for this basename: GLUCAP,  in the last 168 hours  Radiological Exams on Admission: Dg Chest 2 View  11/23/2013   CLINICAL DATA:  Chest pain with cough and shortness of breath and history of sickle cell anemia.  EXAM: CHEST  2 VIEW  COMPARISON:  PA and lateral chest x-ray of November 16, 2013  FINDINGS: The lungs are well-expanded. The interstitial markings are increased bilaterally and the pulmonary vascularity is more engorged. The cardiopericardial silhouette remains enlarged. Slightly increased right infrahilar lung markings are noted. There is persistent blunting of the right lateral costophrenic angle. The bony thorax exhibits no acute abnormality.  IMPRESSION: Slightly increased pulmonary interstitial densities are noted  bilaterally which may reflect worsening of interstitial edema. Atelectasis or pneumonia in the right infrahilar region is suspected. Follow-up films following therapy are recommended.   Electronically Signed   By: David  [place]   On: 11/23/2013 16:30    EKG:  Pending   Assessment/Plan Active Problems:   Leukocytosis   Sickle cell crisis   SVT (supraventricular tachycardia)   Generalized anxiety disorder   CAP (community acquired pneumonia)   Acute respiratory failure with hypoxia  ---  Acute hypoxic respiratory failure secondary to community acquired pneumonia vs. 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 -  Defer blood transfusion for now since previous bloodwork indicates he has been in the 6mg /dl range frequently and retic count not back yet, but if hgb  trending down at next check would transfuse a unit  Diet:  regular Access:  PIV IVF:  Yes Proph:  Lovenox  Code Status: Full Family Communication: Patient alone Disposition Plan: Admit to telemetry  Time spent: 60 min Renae Fickle [facility] Pager [phone]  If 7PM-7AM, please contact night-coverage [email or web address] Password  [email or web address] 11/23/2013, 7:26 PM

## 2013-11-23 NOTE — Procedures (Signed)
[facility]  Procedure Note  [name] [id number] DOB: [birth year] DOA: 11/23/2013   PCP: MATTHEWS,MICHELLE A., MD   Associated Diagnosis: Sickle Cell Anemia  Procedure Note: Patient taken for xray as ordered, O2 initiated via nasal canula, O2 sat checked during ambulation on RA, IV access initiated, IV fluids initiated, dilaudid IV x1 given, Labs drawn.      Condition During Procedure: Tolerated well. Patient c/o pain, MD aware. VSS   Condition at Discharge: Patient direct admitted to tele per MD order. Patient in no apparent distress. Patient left day hospital with belongings via wheelchair escorted by nursing staff. Patient transported with portable O2 @ 2L via [place] and IV access patent. Patient placed on wall O2 in [facility]Room 1420. Patient left in at bedside, nurse taking over patient care at bedside.   Allyn KennerBushnell, Nason Conradt Natasha, RN  [facility]

## 2013-11-24 DIAGNOSIS — I471 Supraventricular tachycardia: Secondary | ICD-10-CM

## 2013-11-24 DIAGNOSIS — D5700 Hb-SS disease with crisis, unspecified: Secondary | ICD-10-CM

## 2013-11-24 DIAGNOSIS — J189 Pneumonia, unspecified organism: Principal | ICD-10-CM

## 2013-11-24 DIAGNOSIS — N2589 Other disorders resulting from impaired renal tubular function: Secondary | ICD-10-CM | POA: Diagnosis present

## 2013-11-24 LAB — COMPREHENSIVE METABOLIC PANEL
ALT: 41 U/L (ref 0–53)
AST: 104 U/L — AB (ref 0–37)
Albumin: 2.4 g/dL — ABNORMAL LOW (ref 3.5–5.2)
Alkaline Phosphatase: 82 U/L (ref 39–117)
Anion gap: 9 (ref 5–15)
BILIRUBIN TOTAL: 4.6 mg/dL — AB (ref 0.3–1.2)
BUN: 18 mg/dL (ref 6–23)
CO2: 20 mEq/L (ref 19–32)
Calcium: 7.9 mg/dL — ABNORMAL LOW (ref 8.4–10.5)
Chloride: 108 mEq/L (ref 96–112)
Creatinine, Ser: 0.94 mg/dL (ref 0.50–1.35)
GFR calc Af Amer: >90 mL/min (ref >90)
GFR calc non Af Amer: >90 mL/min (ref >90)
Glucose, Bld: 103 mg/dL — ABNORMAL HIGH (ref 70–99)
Potassium: 4.4 mEq/L (ref 3.7–5.3)
SODIUM: 137 meq/L (ref 137–147)
Total Protein: 7.1 g/dL (ref 6.0–8.3)

## 2013-11-24 LAB — CBC WITH DIFFERENTIAL/PLATELET
BASOS ABS: 0 10*3/uL (ref 0.0–0.1)
Basophils Relative: 0 % (ref 0–1)
EOS PCT: 1 % (ref 0–5)
Eosinophils Absolute: 0.1 10*3/uL (ref 0.0–0.7)
HCT: 18.2 % — ABNORMAL LOW (ref 39.0–52.0)
Hemoglobin: 6.1 g/dL — CL (ref 13.0–17.0)
LYMPHS PCT: 42 % (ref 12–46)
Lymphs Abs: 6 10*3/uL — ABNORMAL HIGH (ref 0.7–4.0)
MCH: 35.9 pg — ABNORMAL HIGH (ref 26.0–34.0)
MCHC: 33.5 g/dL (ref 30.0–36.0)
MCV: 107.1 fL — ABNORMAL HIGH (ref 78.0–100.0)
MONOS PCT: 4 % (ref 3–12)
Monocytes Absolute: 0.6 10*3/uL (ref 0.1–1.0)
NEUTROS PCT: 53 % (ref 43–77)
Neutro Abs: 7.7 10*3/uL (ref 1.7–7.7)
Platelets: 160 10*3/uL (ref 150–400)
RBC: 1.7 MIL/uL — ABNORMAL LOW (ref 4.22–5.81)
WBC: 14.4 10*3/uL — AB (ref 4.0–10.5)

## 2013-11-24 LAB — HEMOGLOBIN AND HEMATOCRIT, BLOOD
HCT: 20.4 % — ABNORMAL LOW (ref 39.0–52.0)
Hemoglobin: 7.3 g/dL — ABNORMAL LOW (ref 13.0–17.0)

## 2013-11-24 LAB — RETICULOCYTES
RBC.: 1.7 MIL/uL — AB (ref 4.22–5.81)
Retic Ct Pct: >23 % — ABNORMAL HIGH (ref 0.4–3.1)

## 2013-11-24 LAB — STREP PNEUMONIAE URINARY ANTIGEN: Strep Pneumo Urinary Antigen: NEGATIVE

## 2013-11-24 LAB — TROPONIN I: Troponin I: <0.3 ng/mL (ref <0.30)

## 2013-11-24 LAB — PREPARE RBC (CROSSMATCH)

## 2013-11-24 MED ORDER — HYDROMORPHONE HCL 2 MG PO TABS
4.0000 mg | ORAL_TABLET | ORAL | Status: DC
  Administered 2013-11-24 – 2013-11-28 (×16): 4 mg via ORAL
  Filled 2013-11-24 (×21): qty 2

## 2013-11-24 MED ORDER — HYDROMORPHONE HCL 2 MG PO TABS
4.0000 mg | ORAL_TABLET | ORAL | Status: AC
  Administered 2013-11-24: 4 mg via ORAL
  Filled 2013-11-24: qty 2

## 2013-11-24 MED ORDER — HYDROMORPHONE HCL 1 MG/ML IJ SOLN
1.0000 mg | INTRAMUSCULAR | Status: DC | PRN
  Administered 2013-11-25: 1 mg via INTRAVENOUS
  Filled 2013-11-24: qty 1

## 2013-11-24 MED ORDER — DIPHENHYDRAMINE HCL 25 MG PO CAPS
25.0000 mg | ORAL_CAPSULE | Freq: Four times a day (QID) | ORAL | Status: DC | PRN
  Administered 2013-11-25: 25 mg via ORAL
  Filled 2013-11-24: qty 1

## 2013-11-24 MED ORDER — GUAIFENESIN 100 MG/5ML PO SOLN: 5.0000 mL | ORAL | Status: DC | PRN

## 2013-11-24 MED ORDER — SODIUM CHLORIDE 0.9 % IV SOLN
Freq: Once | INTRAVENOUS | Status: AC
  Administered 2013-11-24: 10:00:00 via INTRAVENOUS

## 2013-11-24 MED ORDER — AZITHROMYCIN 250 MG PO TABS: ORAL_TABLET | ORAL | Status: DC

## 2013-11-24 MED ORDER — SODIUM BICARBONATE 325 MG PO TABS: 325.0000 mg | ORAL_TABLET | Freq: Every day | ORAL | Status: DC

## 2013-11-24 MED ORDER — FUROSEMIDE 10 MG/ML IJ SOLN
20.0000 mg | Freq: Once | INTRAMUSCULAR | Status: AC
  Administered 2013-11-24: 20 mg via INTRAVENOUS
  Filled 2013-11-24: qty 2

## 2013-11-24 NOTE — Progress Notes (Addendum)
Wasted 21 mL of Dilaudid PCA in sink with Lynann Beaverhristine Shade PharmD.

## 2013-11-24 NOTE — Progress Notes (Signed)
  Echocardiogram 2D Echocardiogram has been performed.  [name], [name] 11/24/2013, 2:32 PM

## 2013-11-24 NOTE — Progress Notes (Signed)
Critical Lab-Hgb-6.3 Called to hospitalist and informed this is patient's norm. Informed to monitor for symptoms.

## 2013-11-24 NOTE — Progress Notes (Signed)
SICKLE CELL SERVICE PROGRESS NOTE  [name] WUJ:[id number] DOB: June 03, 1970 DOA: 11/23/2013 PCP: MATTHEWS,MICHELLE A., MD  Assessment/Plan: Active Problems:   Leukocytosis   SVT (supraventricular tachycardia)   Sickle cell crisis   Generalized anxiety disorder   CAP (community acquired pneumonia)   Acute respiratory failure with hypoxia   Renal tubular acidosis  1. Community acquired Pneumonia: Pt still a little hypoxic and looking ill. I will continue Azithromycin and Rocephin.  Continue oxygen support. Re-evaluate tomorrow.  Will transfuse and re-check Hb after transfusion.  If Hb < 8 will tranfuse another unit to improve Oxygen carrying capacity. Pt was negative for influenza in the clinic with rapid flu.  2. Hb SS with crisis: Pt's pain is under very good control with intensity only at 2/10. I will discontinue PCA and place on oral analgesics.  3. Anemia: Pt has a low Hb he has not been taking Hydrea due insurance barriers in obtaining the medication. Will address in the out patient setting. 4. SVT: Pt did not take his Metoprolol yesterday and also had some mild dehydration. I suspect the this rebound and volume depletion contributed to his tachycardia. However, his HR is now controlled.  5. Vitamin D deficiency: Continue Drisdol.    Code Status: Full Code Family Communication: N/A Disposition Plan: Not yet ready for discharge. Anticipate discharge in the next 48 hours.  MATTHEWS,MICHELLE A.  Pager [phone]. If 7PM-7AM, please contact night-coverage.  11/24/2013, 5:46 PM  LOS: 1 day   Brief narrative: The patient is a 43 y.o. 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 The patient was last at their baseline health approximately one week  ago. He states he developed some diffuse body pains and started taking his lauded so he stopped taking his methadone. 2 days later, he developed rhinorrhea, sinus congestion, cough productive of green sputum, subjective fevers and chills. He also developed some nausea with diarrhea. His symptoms persisted for approximately 3 days and then his respiratory symptoms gradually improved. His diarrhea stopped and his last bowel movement was 2 days ago. His pain has been progressively worsening and his cough has persisted. He was seen in the sickle cell clinic today with 3/10 diffuse body aches. He had some rales on exam and his oxygen levels were low. He had a chest x-ray which demonstrated possible pneumonia versus interstitial fluid. He has had increased lower extremity edema for several months. He denies orthopnea or PND. He is concerned about a bulging around his bellybutton when he coughs.  In clinic, his vital signs are stable except for oxygen saturation of 85% on room air. His chest x-ray demonstrates slight increased pulmonary interstitial densities consistent with possible interstitial edema versus atelectasis or pneumonia in the right infrahilar region. Labs were obtained in clinic and are pending. He was started on gentle IV fluids and ordered for ceftriaxone and azithromycin. He is being admitted for probable community-acquired pneumonia.   Consultants:  None  Procedures:  None  Antibiotics:  Rocephin 10/22 >>  Azithromycin 10/22 >>  HPI/Subjective: Pt states that his pain is well controlled except when he soughs he has body aches. However he reports feeling weak.    Objective: Filed Vitals:   11/24/13 1007 11/24/13 1050 11/24/13 1236 11/24/13 1338  BP: 118/62 123/69  106/63  Pulse: 81 80  72  Temp:  97.9 F (36.6 C)  98.3 F (36.8 C)  TempSrc:  Oral  Oral  Resp:  16  14  Height:      Weight:      SpO2:   93%    Weight change:   Intake/Output Summary (Last 24 hours) at  11/24/13 1746 Last data filed at 11/24/13 1338  Gross per 24 hour  Intake    300 ml  Output   2375 ml  Net  -2075 ml    General: Alert, awake, oriented x3 but ill appearing.  HEENT: [place]/AT PEERL, EOMI, anicteric Neck: Trachea midline,  no masses, no thyromegal,y no JVD, no carotid bruit OROPHARYNX:  Moist, No exudate/ erythema/lesions.  Heart: Regular rate and rhythm, without murmurs, rubs, gallops, PMI non-displaced, no heaves or thrills on palpation.  Lungs: Clear to auscultation, no wheezing or rhonchi noted. Mild increased vocal fremitus Abdomen: Soft, nontender, nondistended, positive bowel sounds, no masses does have hepatomegaly. Neuro: No focal neurological deficits noted cranial nerves II through XII grossly intact. DTRs 2+ bilaterally upper and lower extremities. Strength functional in bilateral upper and lower extremities. Musculoskeletal: No warm swelling or erythema around joints, no spinal tenderness noted. Psychiatric: Patient alert and oriented x3, good insight and cognition, good recent to remote recall. Lymph node survey: No cervical axillary or inguinal lymphadenopathy noted.   Data Reviewed: Basic Metabolic Panel:  Recent Labs Lab 11/23/13 1805 11/24/13 0140  NA 141 137  K 4.3 4.4  CL 112 108  CO2 18* 20  GLUCOSE 91 103*  BUN 19 18  CREATININE 0.88 0.94  CALCIUM 7.8* 7.9*   Liver Function Tests:  Recent Labs Lab 11/23/13 1805 11/24/13 0140  AST 118* 104*  ALT 43 41  ALKPHOS 86 82  BILITOT 5.7* 4.6*  PROT 7.3 7.1  ALBUMIN 2.5* 2.4*   No results found for this basename: LIPASE, AMYLASE,  in the last 168 hours No results found for this basename: AMMONIA,  in the last 168 hours CBC:  Recent Labs Lab 11/23/13 1805 11/24/13 0140  WBC 14.3* 14.4*  NEUTROABS 7.0 7.7  HGB 6.3* 6.1*  HCT 18.5* 18.2*  MCV 105.1* 107.1*  PLT 231 160   Cardiac Enzymes:  Recent Labs Lab 11/23/13 0001 11/23/13 1805 11/24/13 0140  TROPONINI <0.30 <0.30 <0.30    BNP (last 3 results)  Recent Labs  07/06/13 1755 11/23/13 1937  PROBNP 482.9* 892.0*   CBG: No results found for this basename: GLUCAP,  in the last 168 hours  No results found for this or any previous visit (from the past 240 hour(s)).   Studies: Dg Chest 2 View  11/23/2013   CLINICAL DATA:  Chest pain with cough and shortness of breath and history of sickle cell anemia.  EXAM: CHEST  2 VIEW  COMPARISON:  PA and lateral chest x-ray of November 16, 2013  FINDINGS: The lungs are well-expanded. The interstitial markings are increased bilaterally and the pulmonary vascularity is more engorged. The cardiopericardial silhouette remains enlarged. Slightly increased right infrahilar lung markings are noted. There is persistent blunting of the right lateral costophrenic angle. The bony thorax exhibits no acute abnormality.  IMPRESSION: Slightly increased pulmonary interstitial densities are noted bilaterally which may reflect worsening of interstitial edema. Atelectasis or pneumonia in the right infrahilar region is suspected. Follow-up films following therapy are recommended.   Electronically Signed   By: David  [place]   On: 11/23/2013 16:30   Dg Chest 2 View  11/16/2013   CLINICAL DATA:  Chest pain today, sickle cell crisis  EXAM: CHEST  2 VIEW  COMPARISON:  07/06/2013  FINDINGS: Cardiomediastinal silhouette is stable. No acute infiltrate or pleural effusion. No pulmonary edema. Stable chronic blunting of the right costophrenic angle. Bony thorax is unremarkable.  IMPRESSION: No active disease. Stable chronic blunting of the right costophrenic angle.   Electronically Signed   By: Natasha MeadLiviu  Pop M.D.   On: 11/16/2013 20:25    Scheduled Meds: . allopurinol  300 mg Oral Daily  . azithromycin  500 mg Oral Daily  . cefTRIAXone (ROCEPHIN)  IV  2 g Intravenous Q24H  . enoxaparin (LOVENOX) injection  40 mg Subcutaneous Q24H  . folic acid  1 mg Oral Daily  . HYDROmorphone  4 mg Oral Q4H  . methadone   10 mg Oral QHS  . metoprolol  50 mg Oral Daily  . pantoprazole  40 mg Oral Daily  . sodium bicarbonate  650 mg Oral BID  . Vitamin D (Ergocalciferol)  50,000 Units Oral Q Wed  . zolpidem  10 mg Oral QHS   Time spent 40 minutes.

## 2013-11-24 NOTE — Progress Notes (Signed)
Patients oxygen saturation on room air before ambulation was 96% at rest. Patient ambulated east and west nursing units on room air with saturation ranging from 92-96%. Will continue to monitor patient.Setzer, Don BroachAllison Marie

## 2013-11-24 NOTE — Progress Notes (Signed)
Awake in bed . O2 at 2L per MD orders. Voice no complaints

## 2013-11-24 NOTE — Care Management Note (Addendum)
    Page 1 of 1   11/28/2013     3:15:02 PM CARE MANAGEMENT NOTE 11/28/2013  Patient:  [name],[name]   Account Number:  [id number]  Date Initiated:  11/24/2013  Documentation initiated by:  Lanier ClamMAHABIR,Rickard Kennerly  Subjective/Objective Assessment:   43 Y/O M ADMITTED W/ACUTE RESP FAILURE,PNA VS CHF.HX:SSC,DEPRESSION.     Action/Plan:   FROM HOME.   Anticipated DC Date:  11/28/2013   Anticipated DC Plan:  HOME/SELF CARE      DC Planning Services  CM consult      Choice offered to / List presented to:             Status of service:  Completed, signed off Medicare Important Message given?   (If response is "NO", the following Medicare IM given date fields will be blank) Date Medicare IM given:   Medicare IM given by:   Date Additional Medicare IM given:   Additional Medicare IM given by:    Discharge Disposition:  HOME/SELF CARE  Per UR Regulation:  Reviewed for med. necessity/level of care/duration of stay  If discussed at Long Length of Stay Meetings, dates discussed:    Comments:  11/28/13 [name] Imparato RN,BSN NCM [phone] D/C HOME NO NEEDS OR ORDERS.  11/24/13 [name] Valade RN,BSN NCM [phone] NO ANTICIPATED D/C NEEDS.

## 2013-11-24 NOTE — Progress Notes (Signed)
Lynann Beaverhristine Myla Mauriello PharmD, BCPS Pager [phone] 11/24/2013 10:27 AM

## 2013-11-25 LAB — CBC WITH DIFFERENTIAL/PLATELET
BASOS ABS: 0 10*3/uL (ref 0.0–0.1)
Band Neutrophils: 0 % (ref 0–10)
Basophils Relative: 0 % (ref 0–1)
Blasts: 0 %
EOS ABS: 0.2 10*3/uL (ref 0.0–0.7)
EOS PCT: 2 % (ref 0–5)
HCT: 18.7 % — ABNORMAL LOW (ref 39.0–52.0)
Hemoglobin: 6.3 g/dL — CL (ref 13.0–17.0)
Lymphocytes Relative: 42 % (ref 12–46)
Lymphs Abs: 5.2 10*3/uL — ABNORMAL HIGH (ref 0.7–4.0)
MCH: 34.8 pg — AB (ref 26.0–34.0)
MCHC: 33.7 g/dL (ref 30.0–36.0)
MCV: 103.3 fL — AB (ref 78.0–100.0)
METAMYELOCYTES PCT: 0 %
MYELOCYTES: 0 %
Monocytes Absolute: 1.5 10*3/uL — ABNORMAL HIGH (ref 0.1–1.0)
Monocytes Relative: 12 % (ref 3–12)
NRBC: 94 /100{WBCs} — AB
Neutro Abs: 5.4 10*3/uL (ref 1.7–7.7)
Neutrophils Relative %: 44 % (ref 43–77)
PROMYELOCYTES ABS: 0 %
Platelets: 148 10*3/uL — ABNORMAL LOW (ref 150–400)
RBC: 1.81 MIL/uL — AB (ref 4.22–5.81)
WBC: 12.3 10*3/uL — ABNORMAL HIGH (ref 4.0–10.5)

## 2013-11-25 LAB — PREPARE RBC (CROSSMATCH)

## 2013-11-25 LAB — LEGIONELLA ANTIGEN, URINE

## 2013-11-25 LAB — LACTATE DEHYDROGENASE: LDH: 1117 U/L — AB (ref 94–250)

## 2013-11-25 MED ORDER — HYDROMORPHONE HCL 1 MG/ML IJ SOLN
1.0000 mg | Freq: Once | INTRAMUSCULAR | Status: AC
  Administered 2013-11-25: 1 mg via INTRAVENOUS
  Filled 2013-11-25: qty 1

## 2013-11-25 MED ORDER — HYDROMORPHONE HCL 2 MG/ML IJ SOLN
2.0000 mg | INTRAMUSCULAR | Status: DC | PRN
  Administered 2013-11-25 – 2013-11-27 (×6): 2 mg via INTRAVENOUS
  Filled 2013-11-25 (×6): qty 1

## 2013-11-25 MED ORDER — SODIUM CHLORIDE 0.9 % IV SOLN
Freq: Once | INTRAVENOUS | Status: AC
  Administered 2013-11-25: 10:00:00 via INTRAVENOUS

## 2013-11-25 MED ORDER — IBUPROFEN 200 MG PO TABS
600.0000 mg | ORAL_TABLET | Freq: Four times a day (QID) | ORAL | Status: DC | PRN
  Administered 2013-11-25 – 2013-11-27 (×3): 600 mg via ORAL
  Filled 2013-11-25 (×3): qty 3

## 2013-11-25 NOTE — Progress Notes (Signed)
CRITICAL VALUE ALERT  Critical value received:  Hgb 6.3  Date of notification:  11/25/13  Time of notification:  0515  Critical value read back:Yes.    Nurse who received alert:  Lorretta Harp Nelani Schmelzle, RN  MD notified (1st page):  [place]  Time of first page:  0530  MD notified (2nd page):  Time of second page:  Responding MD:  [place]  Time MD responded:  0540, No new orders at this time

## 2013-11-25 NOTE — Progress Notes (Signed)
Patient ID: [name], male   DOB: 1970/04/06, 43 y.o.   MRN: [id number] SICKLE CELL SERVICE PROGRESS NOTE  Amiri Riechers WUJ:[id number] DOB: [birth year] DOA: 11/23/2013 PCP: MATTHEWS,MICHELLE A., MD   Presenting HPI: The patient is a 43 y.o. 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 The patient was last at their baseline health approximately one week ago. He states he developed some diffuse body pains and started taking his lauded so he stopped taking his methadone. 2 days later, he developed rhinorrhea, sinus congestion, cough productive of green sputum, subjective fevers and chills. He also developed some nausea with diarrhea. His symptoms persisted for approximately 3 days and then his respiratory symptoms gradually improved. His diarrhea stopped and his last bowel movement was 2 days ago. His pain has been progressively worsening and his cough has persisted. He was seen in the sickle cell clinic today with 3/10 diffuse body aches. He had some rales on exam and his oxygen levels were low. He had a chest x-ray which demonstrated possible pneumonia versus interstitial fluid. He has had increased lower extremity edema for several months. He denies orthopnea or PND. He is concerned about a bulging around his bellybutton when he coughs.  In clinic, his vital signs are stable except for oxygen saturation of 85% on room air. His chest x-ray demonstrates slight increased pulmonary interstitial densities consistent with possible interstitial edema versus atelectasis or pneumonia in the right infrahilar region. Labs were obtained in clinic and are pending. He was started on gentle IV fluids and ordered for ceftriaxone and azithromycin. He is being admitted for probable  community-acquired pneumonia.   Consultants:  None  Procedures:  None  Antibiotics: Rocephin 10/22 >>  Azithromycin 10/22 >>   HPI/Subjective: Pt states that his pain has gotten a bit worse and has gone from a 2 to a 4/10. He feels that his oral Dilaudid is helping but the injections are not helping when the pain is worse. He is eating well and ambulating. Last bowel movement was this morning. His CP and SOB is better.   Objective: Filed Vitals:   11/24/13 1236 11/24/13 1338 11/24/13 2147 11/25/13 0435  BP:  106/63 107/52 129/72  Pulse:  72 74 87  Temp:  98.3 F (36.8 C) 98 F (36.7 C) 98 F (36.7 C)  TempSrc:  Oral Oral Oral  Resp:  14 18 18   Height:      Weight:    168 lb 4.8 oz (76.34 kg)  SpO2: 93%  95% 90%   Weight change: -6 lb 11.2 oz (-3.039 kg)  Intake/Output Summary (Last 24 hours) at 11/25/13 0904 Last data filed at 11/24/13 2304  Gross per 24 hour  Intake    350 ml  Output   1000 ml  Net   -650 ml    General: Alert, awake, oriented x3, in no acute distress.  HEENT: /AT PEERL, EOMI Neck: Trachea midline,  no masses, no thyromegal,y no JVD, no carotid bruit OROPHARYNX:  Moist, No exudate/ erythema/lesions.  Heart: Regular rate and rhythm, without murmurs, rubs, gallops.  Lungs: Clear to auscultation, no wheezing or rhonchi noted.  Abdomen: Soft, nontender, nondistended, positive bowel sounds, no masses no hepatosplenomegaly noted. Small umbilical hernia that is reducible and non-tender. Neuro: No focal neurological deficits noted cranial nerves II through XII  grossly intact.Strength 5 out of 5 in bilateral upper and lower extremities. Musculoskeletal: No warm swelling or erythema around joints, no spinal tenderness noted. Psychiatric: Patient alert and oriented x3, good insight and cognition, good recent to remote recall. Lymph node survey: No cervical axillary or inguinal lymphadenopathy noted.   Data Reviewed: Basic Metabolic Panel:  Recent  Labs Lab 11/23/13 1805 11/24/13 0140  NA 141 137  K 4.3 4.4  CL 112 108  CO2 18* 20  GLUCOSE 91 103*  BUN 19 18  CREATININE 0.88 0.94  CALCIUM 7.8* 7.9*   Liver Function Tests:  Recent Labs Lab 11/23/13 1805 11/24/13 0140  AST 118* 104*  ALT 43 41  ALKPHOS 86 82  BILITOT 5.7* 4.6*  PROT 7.3 7.1  ALBUMIN 2.5* 2.4*   No results found for this basename: LIPASE, AMYLASE,  in the last 168 hours No results found for this basename: AMMONIA,  in the last 168 hours CBC:  Recent Labs Lab 11/23/13 1805 11/24/13 0140 11/24/13 1905 11/25/13 0410  WBC 14.3* 14.4*  --  12.3*  NEUTROABS 7.0 7.7  --  5.4  HGB 6.3* 6.1* 7.3* 6.3*  HCT 18.5* 18.2* 20.4* 18.7*  MCV 105.1* 107.1*  --  103.3*  PLT 231 160  --  148*   Cardiac Enzymes:  Recent Labs Lab 11/23/13 0001 11/23/13 1805 11/24/13 0140  TROPONINI <0.30 <0.30 <0.30   BNP (last 3 results)  Recent Labs  07/06/13 1755 11/23/13 1937  PROBNP 482.9* 892.0*   CBG: No results found for this basename: GLUCAP,  in the last 168 hours  Recent Results (from the past 240 hour(s))  CULTURE, BLOOD (ROUTINE X 2)     Status: None   Collection Time    11/23/13  8:13 PM      Result Value Ref Range Status   Specimen Description BLOOD LEFT HAND   Final   Special Requests BOTTLES DRAWN AEROBIC ONLY 5CC   Final   Culture  Setup Time     Final   Value: 11/24/2013 03:44     Performed at [facility]   Culture     Final   Value:        BLOOD CULTURE RECEIVED NO GROWTH TO DATE CULTURE WILL BE HELD FOR 5 DAYS BEFORE ISSUING A FINAL NEGATIVE REPORT     Performed at [facility]   Report Status PENDING   Incomplete  CULTURE, BLOOD (ROUTINE X 2)     Status: None   Collection Time    11/23/13  8:17 PM      Result Value Ref Range Status   Specimen Description BLOOD RIGHT HAND   Final   Special Requests BOTTLES DRAWN AEROBIC ONLY 5CC   Final   Culture  Setup Time     Final   Value: 11/24/2013 03:44     Performed at  [facility]   Culture     Final   Value:        BLOOD CULTURE RECEIVED NO GROWTH TO DATE CULTURE WILL BE HELD FOR 5 DAYS BEFORE ISSUING A FINAL NEGATIVE REPORT     Performed at [facility]   Report Status PENDING   Incomplete     Studies: Dg Chest 2 View  11/23/2013   CLINICAL DATA:  Chest pain with cough and shortness of breath and history of sickle cell anemia.  EXAM: CHEST  2 VIEW  COMPARISON:  PA and lateral chest x-ray of November 16, 2013  FINDINGS: The lungs are well-expanded. The interstitial markings are increased bilaterally and the pulmonary vascularity is more engorged. The cardiopericardial silhouette remains enlarged. Slightly increased right infrahilar lung markings are noted. There is persistent blunting of the right lateral costophrenic angle. The bony thorax exhibits no acute abnormality.  IMPRESSION: Slightly increased pulmonary interstitial densities are noted bilaterally which may reflect worsening of interstitial edema. Atelectasis or pneumonia in the right infrahilar region is suspected. Follow-up films following therapy are recommended.   Electronically Signed   By: David  Jordan   On: 11/23/2013 16:30   Dg Chest 2 View  11/16/2013   [place] DATA:  Chest pain today, sickle cell crisis  EXAM: CHEST  2 VIEW  COMPARISON:  07/06/2013  FINDINGS: Cardiomediastinal silhouette is stable. No acute infiltrate or pleural effusion. No pulmonary edema. Stable chronic blunting of the right costophrenic angle. Bony thorax is unremarkable.  IMPRESSION: No active disease. Stable chronic blunting of the right costophrenic angle.   Electronically Signed   By: Natasha MeadLiviu  Pop M.D.   On: 11/16/2013 20:25    Scheduled Meds: . sodium chloride   Intravenous Once  . allopurinol  300 mg Oral Daily  . azithromycin  500 mg Oral Daily  . cefTRIAXone (ROCEPHIN)  IV  2 g Intravenous Q24H  . enoxaparin (LOVENOX) injection  40 mg Subcutaneous Q24H  . folic acid  1 mg Oral Daily  .  HYDROmorphone  4 mg Oral Q4H  . methadone  10 mg Oral QHS  . metoprolol  50 mg Oral Daily  . pantoprazole  40 mg Oral Daily  . sodium bicarbonate  650 mg Oral BID  . Vitamin D (Ergocalciferol)  50,000 Units Oral Q Wed  . zolpidem  10 mg Oral QHS   Continuous Infusions:   Active Problems:   Leukocytosis   Sickle cell crisis   SVT (supraventricular tachycardia)   Generalized anxiety disorder   CAP (community acquired pneumonia)   Acute respiratory failure with hypoxia   Renal tubular acidosis   Assessment/Plan: Active Problems:   Leukocytosis   Sickle cell crisis   SVT (supraventricular tachycardia)   Generalized anxiety disorder   CAP (community acquired pneumonia)   Acute respiratory failure with hypoxia   Renal tubular acidosis  1)Community acquired Pneumonia: Hypoxia is improving. Continue Azithromycin and Rocephin. Continue oxygen support as needed, currently stable on RA. Will transfuse another 1 unit PRBC as Hb dropped again at 6.3 to improve Oxygen carrying capacity. Pt was negative for influenza in the clinic with rapid flu. On droplet precautions.  2)Hb SS with crisis: Pt's pain is minimal and well controlled rating between 2-4/10. Continue PO Dilaudid but will increase Dilaudid IV to 2mg  q3h PRN for increased pain.   3)Anemia: Hgb of 6.3 this morning after 1 unit PRBC. Will transfuse another unit. See above.  4)SVT: nl HR   5. FEN/GI :Continue Drisdol.  Regular Diet  IV fluids-KVO Bowel regimen in place  Code Status: full code DVT Prophylaxis: enoxparin Family Communication: none Disposition Plan: pending further improvement  Serina CowperAGBOOLA, Devanshi Califf  Pager [phone]. If 7PM-7AM, please contact night-coverage.  11/25/2013, 9:04 AM  LOS: 2 days   Serina CowperAGBOOLA, Govani Radloff

## 2013-11-26 DIAGNOSIS — E875 Hyperkalemia: Secondary | ICD-10-CM | POA: Diagnosis not present

## 2013-11-26 LAB — RETICULOCYTES
RBC.: 2.15 MIL/uL — ABNORMAL LOW (ref 4.22–5.81)
Retic Ct Pct: >23 % — ABNORMAL HIGH (ref 0.4–3.1)

## 2013-11-26 LAB — BASIC METABOLIC PANEL
ANION GAP: 7 (ref 5–15)
BUN: 17 mg/dL (ref 6–23)
CALCIUM: 8.1 mg/dL — AB (ref 8.4–10.5)
CO2: 21 mEq/L (ref 19–32)
CREATININE: 0.89 mg/dL (ref 0.50–1.35)
Chloride: 114 mEq/L — ABNORMAL HIGH (ref 96–112)
GFR calc non Af Amer: >90 mL/min (ref >90)
Glucose, Bld: 91 mg/dL (ref 70–99)
Potassium: 5.8 mEq/L — ABNORMAL HIGH (ref 3.7–5.3)
Sodium: 142 mEq/L (ref 137–147)

## 2013-11-26 LAB — CBC WITH DIFFERENTIAL/PLATELET
BASOS ABS: 0 10*3/uL (ref 0.0–0.1)
Basophils Relative: 0 % (ref 0–1)
EOS ABS: 0.9 10*3/uL — AB (ref 0.0–0.7)
Eosinophils Relative: 8 % — ABNORMAL HIGH (ref 0–5)
HCT: 21.5 % — ABNORMAL LOW (ref 39.0–52.0)
HEMOGLOBIN: 7.3 g/dL — AB (ref 13.0–17.0)
LYMPHS PCT: 39 % (ref 12–46)
Lymphs Abs: 4.6 10*3/uL — ABNORMAL HIGH (ref 0.7–4.0)
MCH: 34 pg (ref 26.0–34.0)
MCHC: 34 g/dL (ref 30.0–36.0)
MCV: 100 fL (ref 78.0–100.0)
Monocytes Absolute: 0.5 10*3/uL (ref 0.1–1.0)
Monocytes Relative: 4 % (ref 3–12)
NEUTROS PCT: 49 % (ref 43–77)
Neutro Abs: 5.8 10*3/uL (ref 1.7–7.7)
Platelets: 167 10*3/uL (ref 150–400)
RBC: 2.15 MIL/uL — ABNORMAL LOW (ref 4.22–5.81)
RDW: 27.7 % — AB (ref 11.5–15.5)
WBC: 11.8 10*3/uL — ABNORMAL HIGH (ref 4.0–10.5)
nRBC: 68 /100 WBC — ABNORMAL HIGH

## 2013-11-26 LAB — RESPIRATORY VIRUS PANEL
Adenovirus: NOT DETECTED
INFLUENZA A H1: NOT DETECTED
INFLUENZA A H3: NOT DETECTED
INFLUENZA A: NOT DETECTED
Influenza B: NOT DETECTED
Metapneumovirus: NOT DETECTED
Parainfluenza 1: NOT DETECTED
Parainfluenza 2: NOT DETECTED
Parainfluenza 3: NOT DETECTED
RESPIRATORY SYNCYTIAL VIRUS A: NOT DETECTED
Respiratory Syncytial Virus B: NOT DETECTED
Rhinovirus: NOT DETECTED

## 2013-11-26 LAB — TYPE AND SCREEN
ABO/RH(D): A POS
ANTIBODY SCREEN: NEGATIVE
UNIT DIVISION: 0
Unit division: 0

## 2013-11-26 LAB — LACTATE DEHYDROGENASE: LDH: 969 U/L — AB (ref 94–250)

## 2013-11-26 LAB — POTASSIUM: Potassium: 5.6 mEq/L — ABNORMAL HIGH (ref 3.7–5.3)

## 2013-11-26 MED ORDER — SODIUM POLYSTYRENE SULFONATE 15 GM/60ML PO SUSP
30.0000 g | Freq: Once | ORAL | Status: AC
Start: 2013-11-26 — End: 2013-11-26
  Administered 2013-11-26: 30 g via ORAL
  Filled 2013-11-26: qty 120

## 2013-11-26 MED ORDER — AMOXICILLIN-POT CLAVULANATE 875-125 MG PO TABS
1.0000 | ORAL_TABLET | Freq: Two times a day (BID) | ORAL | Status: DC
  Administered 2013-11-26 – 2013-11-28 (×4): 1 via ORAL
  Filled 2013-11-26 (×4): qty 1

## 2013-11-26 MED ORDER — SODIUM POLYSTYRENE SULFONATE 15 GM/60ML PO SUSP
15.0000 g | Freq: Once | ORAL | Status: DC
  Filled 2013-11-26: qty 60

## 2013-11-26 NOTE — Progress Notes (Signed)
Patient ID: [name], male   DOB: 1970/08/09, 43 y.o.   MRN: [id number] SICKLE CELL SERVICE PROGRESS NOTE  [name] JIR:[id number] DOB: 12/31/1970 DOA: 11/23/2013 PCP: MATTHEWS,MICHELLE A., MD   Presenting HPI: The patient is a 43 y.o. 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 The patient was last at their baseline health approximately one week ago. He states he developed some diffuse body pains and started taking his lauded so he stopped taking his methadone. 2 days later, he developed rhinorrhea, sinus congestion, cough productive of green sputum, subjective fevers and chills. He also developed some nausea with diarrhea. His symptoms persisted for approximately 3 days and then his respiratory symptoms gradually improved. His diarrhea stopped and his last bowel movement was 2 days ago. His pain has been progressively worsening and his cough has persisted. He was seen in the sickle cell clinic today with 3/10 diffuse body aches. He had some rales on exam and his oxygen levels were low. He had a chest x-ray which demonstrated possible pneumonia versus interstitial fluid. He has had increased lower extremity edema for several months. He denies orthopnea or PND. He is concerned about a bulging around his bellybutton when he coughs.  In clinic, his vital signs are stable except for oxygen saturation of 85% on room air. His chest x-ray demonstrates slight increased pulmonary interstitial densities consistent with possible interstitial edema versus atelectasis or pneumonia in the right infrahilar region. Labs were obtained in clinic and are pending. He was started on gentle IV fluids and ordered for ceftriaxone and azithromycin. He is being admitted for probable  community-acquired pneumonia.   Consultants:  None  Procedures:  None  Antibiotics: Rocephin 10/22 >>  Azithromycin 10/22 >>   HPI/Subjective: Pt states that his pain has gotten a bit worse and has gone from a 2 to a 4/10. He feels that his oral Dilaudid is helping but the injections are not helping when the pain is worse. He is eating well and ambulating. Last bowel movement was this morning. His CP and SOB is better.   Objective: Filed Vitals:   11/25/13 2005 11/26/13 0500 11/26/13 0510 11/26/13 1415  BP: 118/61  118/67 121/58  Pulse: 75   70  Temp: 98.4 F (36.9 C)  98.3 F (36.8 C) 98.4 F (36.9 C)  TempSrc: Oral  Oral Oral  Resp: $Remo'17  15 16  'RGsQc$ Height:      Weight:  167 lb 6.4 oz (75.932 kg)    SpO2: 94%  90% 96%   Weight change: -14.4 oz (-0.408 kg)  Intake/Output Summary (Last 24 hours) at 11/26/13 1609 Last data filed at 11/26/13 1300  Gross per 24 hour  Intake 977.92 ml  Output   1500 ml  Net -522.08 ml    General: Alert, awake, oriented x3, in no acute distress.  HEENT: [place]/AT PEERL, EOMI Neck: Trachea midline,  no masses, no thyromegal,y no JVD, no carotid bruit OROPHARYNX:  Moist, No exudate/ erythema/lesions.  Heart: Regular rate and rhythm, without murmurs, rubs, gallops.  Lungs: Clear to auscultation, no wheezing or rhonchi noted.  Abdomen: Soft, nontender, nondistended, positive bowel sounds, no masses no hepatosplenomegaly noted.  Neuro: No focal neurological deficits noted cranial nerves II through XII grossly intact. Strength 5 out of 5 in bilateral upper and lower extremities. Musculoskeletal: No  warmth swelling or erythema around joints, no spinal tenderness noted.   Data Reviewed: Basic Metabolic Panel:  Recent Labs Lab 11/23/13 1805 11/24/13 0140 11/26/13 0542  NA 141 137 142  K 4.3 4.4 5.8*  CL 112 108 114*  CO2 18* 20 21  GLUCOSE 91 103* 91  BUN $Re'19 18 17  'nYn$ CREATININE 0.88 0.94 0.89  CALCIUM 7.8* 7.9* 8.1*   Liver Function  Tests:  Recent Labs Lab 11/23/13 1805 11/24/13 0140  AST 118* 104*  ALT 43 41  ALKPHOS 86 82  BILITOT 5.7* 4.6*  PROT 7.3 7.1  ALBUMIN 2.5* 2.4*   No results found for this basename: LIPASE, AMYLASE,  in the last 168 hours No results found for this basename: AMMONIA,  in the last 168 hours CBC:  Recent Labs Lab 11/23/13 1805 11/24/13 0140 11/24/13 1905 11/25/13 0410 11/26/13 0542  WBC 14.3* 14.4*  --  12.3* 11.8*  NEUTROABS 7.0 7.7  --  5.4 5.8  HGB 6.3* 6.1* 7.3* 6.3* 7.3*  HCT 18.5* 18.2* 20.4* 18.7* 21.5*  MCV 105.1* 107.1*  --  103.3* 100.0  PLT 231 160  --  148* 167   Cardiac Enzymes:  Recent Labs Lab 11/23/13 0001 11/23/13 1805 11/24/13 0140  TROPONINI <0.30 <0.30 <0.30   BNP (last 3 results)  Recent Labs  07/06/13 1755 11/23/13 1937  PROBNP 482.9* 892.0*   CBG: No results found for this basename: GLUCAP,  in the last 168 hours  Recent Results (from the past 240 hour(s))  CULTURE, BLOOD (ROUTINE X 2)     Status: None   Collection Time    11/23/13  8:13 PM      Result Value Ref Range Status   Specimen Description BLOOD LEFT HAND   Final   Special Requests BOTTLES DRAWN AEROBIC ONLY 5CC   Final   Culture  Setup Time     Final   Value: 11/24/2013 03:44     Performed at [facility]   Culture     Final   Value:        BLOOD CULTURE RECEIVED NO GROWTH TO DATE CULTURE WILL BE HELD FOR 5 DAYS BEFORE ISSUING A FINAL NEGATIVE REPORT     Performed at [facility]   Report Status PENDING   Incomplete  CULTURE, BLOOD (ROUTINE X 2)     Status: None   Collection Time    11/23/13  8:17 PM      Result Value Ref Range Status   Specimen Description BLOOD RIGHT HAND   Final   Special Requests BOTTLES DRAWN AEROBIC ONLY 5CC   Final   Culture  Setup Time     Final   Value: 11/24/2013 03:44     Performed at [facility]   Culture     Final   Value:        BLOOD CULTURE RECEIVED NO GROWTH TO DATE CULTURE WILL BE HELD FOR 5 DAYS BEFORE  ISSUING A FINAL NEGATIVE REPORT     Performed at [facility]   Report Status PENDING   Incomplete  RESPIRATORY VIRUS PANEL     Status: None   Collection Time    11/23/13 11:29 PM      Result Value Ref Range Status   Source - RVPAN NASOPHARYNGEAL   Final   Respiratory Syncytial Virus A NOT DETECTED   Final   Respiratory Syncytial Virus B NOT DETECTED   Final   Influenza A NOT DETECTED  Final   Influenza B NOT DETECTED   Final   Parainfluenza 1 NOT DETECTED   Final   Parainfluenza 2 NOT DETECTED   Final   Parainfluenza 3 NOT DETECTED   Final   Metapneumovirus NOT DETECTED   Final   Rhinovirus NOT DETECTED   Final   Adenovirus NOT DETECTED   Final   Influenza A H1 NOT DETECTED   Final   Influenza A H3 NOT DETECTED   Final   Comment: (NOTE)           Normal Reference Range for each Analyte: NOT DETECTED     Testing performed using the Luminex xTAG Respiratory Viral Panel test     kit.     The analytical performance characteristics of this assay have been     determined by [facility].  The modifications have not been     cleared or approved by the FDA. This assay has been validated pursuant     to the CLIA regulations and is used for clinical purposes.     Performed at [facility]     Studies: Dg Chest 2 View  11/23/2013   CLINICAL DATA:  Chest pain with cough and shortness of breath and history of sickle cell anemia.  EXAM: CHEST  2 VIEW  COMPARISON:  PA and lateral chest x-ray of November 16, 2013  FINDINGS: The lungs are well-expanded. The interstitial markings are increased bilaterally and the pulmonary vascularity is more engorged. The cardiopericardial silhouette remains enlarged. Slightly increased right infrahilar lung markings are noted. There is persistent blunting of the right lateral costophrenic angle. The bony thorax exhibits no acute abnormality.  IMPRESSION: Slightly increased pulmonary interstitial densities are noted bilaterally which may  reflect worsening of interstitial edema. Atelectasis or pneumonia in the right infrahilar region is suspected. Follow-up films following therapy are recommended.   Electronically Signed   By: David  Martinique   On: 11/23/2013 16:30   Dg Chest 2 View  11/16/2013   CLINICAL DATA:  Chest pain today, sickle cell crisis  EXAM: CHEST  2 VIEW  COMPARISON:  07/06/2013  FINDINGS: Cardiomediastinal silhouette is stable. No acute infiltrate or pleural effusion. No pulmonary edema. Stable chronic blunting of the right costophrenic angle. Bony thorax is unremarkable.  IMPRESSION: No active disease. Stable chronic blunting of the right costophrenic angle.   Electronically Signed   By: Lahoma Crocker M.D.   On: 11/16/2013 20:25    Scheduled Meds: . allopurinol  300 mg Oral Daily  . azithromycin  500 mg Oral Daily  . cefTRIAXone (ROCEPHIN)  IV  2 g Intravenous Q24H  . enoxaparin (LOVENOX) injection  40 mg Subcutaneous Q24H  . folic acid  1 mg Oral Daily  . HYDROmorphone  4 mg Oral Q4H  . methadone  10 mg Oral QHS  . metoprolol  50 mg Oral Daily  . pantoprazole  40 mg Oral Daily  . sodium bicarbonate  650 mg Oral BID  . sodium polystyrene  30 g Oral Once  . Vitamin D (Ergocalciferol)  50,000 Units Oral Q Wed  . zolpidem  10 mg Oral QHS   Continuous Infusions:   Active Problems:   Leukocytosis   Sickle cell crisis   SVT (supraventricular tachycardia)   Generalized anxiety disorder   CAP (community acquired pneumonia)   Acute respiratory failure with hypoxia   Renal tubular acidosis   Assessment/Plan: Active Problems:   Leukocytosis   Sickle cell crisis   SVT (supraventricular  tachycardia)   Generalized anxiety disorder   CAP (community acquired pneumonia)   Acute respiratory failure with hypoxia   Renal tubular acidosis  1)Community acquired Pneumonia: Overall respiratory status is improving. Continue Azithromycin will discontinue Rocephin and Start Augmentin $RemoveBeforeD'875mg'vpRjQrbfmYIrTP$  q12h. Continue oxygen support as  needed. Take off Droplet Precautions. Repeat CXR in the AM.  2)Hb SS with crisis: Pt's pain is minimal and well controlled rating between 2-4/10. Continue PO Dilaudid and Dilaudid IV $RemoveBef'2mg'cjTHzdbLQg$  q3h PRN for increased pain.   3)Anemia: Hgb of 7.3 this morning after a total of 2 units PRBC. Continue to monitor.  4)Hyperkalemia: Potassium of 5. 8 this morning. Likely due to extensive hemolysis. Will give dose of Kayexalate 30Gm. Will recheck and give more kayexalate to remove extra potassium. Check EKG to r/o T-wave abnormality.   5. FEN/GI :Continue Drisdol. Has reducible umbilical hernia. He should have that f/u as an out patient. Regular Diet  IV fluids-KVO Bowel regimen in place  Code Status: full code DVT Prophylaxis: enoxparin Family Communication: none Disposition Plan: pending further improvement  Kalman Shan  Pager [phone]. If 7PM-7AM, please contact night-coverage.  11/26/2013, 4:09 PM  LOS: 3 days   Kalman Shan

## 2013-11-27 ENCOUNTER — Inpatient Hospital Stay (HOSPITAL_COMMUNITY): Payer: BC Managed Care – PPO

## 2013-11-27 LAB — BASIC METABOLIC PANEL
Anion gap: 10 (ref 5–15)
Anion gap: 8 (ref 5–15)
BUN: 16 mg/dL (ref 6–23)
BUN: 16 mg/dL (ref 6–23)
CALCIUM: 8.1 mg/dL — AB (ref 8.4–10.5)
CHLORIDE: 110 meq/L (ref 96–112)
CO2: 20 meq/L (ref 19–32)
CO2: 23 meq/L (ref 19–32)
CREATININE: 0.81 mg/dL (ref 0.50–1.35)
CREATININE: 0.85 mg/dL (ref 0.50–1.35)
Calcium: 8.4 mg/dL (ref 8.4–10.5)
Chloride: 113 mEq/L — ABNORMAL HIGH (ref 96–112)
GFR calc Af Amer: >90 mL/min (ref >90)
GFR calc non Af Amer: >90 mL/min (ref >90)
GFR calc non Af Amer: >90 mL/min (ref >90)
GLUCOSE: 117 mg/dL — AB (ref 70–99)
GLUCOSE: 96 mg/dL (ref 70–99)
POTASSIUM: 6 meq/L — AB (ref 3.7–5.3)
Potassium: 5.4 mEq/L — ABNORMAL HIGH (ref 3.7–5.3)
SODIUM: 143 meq/L (ref 137–147)
Sodium: 141 mEq/L (ref 137–147)

## 2013-11-27 LAB — CBC WITH DIFFERENTIAL/PLATELET
BASOS ABS: 0.1 10*3/uL (ref 0.0–0.1)
Basophils Relative: 1 % (ref 0–1)
Eosinophils Absolute: 0.7 10*3/uL (ref 0.0–0.7)
Eosinophils Relative: 6 % — ABNORMAL HIGH (ref 0–5)
HCT: 22.3 % — ABNORMAL LOW (ref 39.0–52.0)
Hemoglobin: 7.8 g/dL — ABNORMAL LOW (ref 13.0–17.0)
LYMPHS ABS: 3.3 10*3/uL (ref 0.7–4.0)
Lymphocytes Relative: 29 % (ref 12–46)
MCH: 35.6 pg — ABNORMAL HIGH (ref 26.0–34.0)
MCHC: 35 g/dL (ref 30.0–36.0)
MCV: 101.8 fL — ABNORMAL HIGH (ref 78.0–100.0)
Monocytes Absolute: 1 10*3/uL (ref 0.1–1.0)
Monocytes Relative: 9 % (ref 3–12)
NEUTROS ABS: 6.2 10*3/uL (ref 1.7–7.7)
Neutrophils Relative %: 55 % (ref 43–77)
Platelets: 204 10*3/uL (ref 150–400)
RBC: 2.19 MIL/uL — ABNORMAL LOW (ref 4.22–5.81)
RDW: 26.5 % — AB (ref 11.5–15.5)
WBC: 11.3 10*3/uL — AB (ref 4.0–10.5)

## 2013-11-27 LAB — LACTATE DEHYDROGENASE: LDH: 960 U/L — ABNORMAL HIGH (ref 94–250)

## 2013-11-27 LAB — RETICULOCYTES
RBC.: 2.19 MIL/uL — AB (ref 4.22–5.81)
RETIC COUNT ABSOLUTE: 490.6 10*3/uL — AB (ref 19.0–186.0)
Retic Ct Pct: 22.4 % — ABNORMAL HIGH (ref 0.4–3.1)

## 2013-11-27 LAB — POTASSIUM: Potassium: 6.3 mEq/L — ABNORMAL HIGH (ref 3.7–5.3)

## 2013-11-27 MED ORDER — DEXTROSE-NACL 5-0.45 % IV SOLN
INTRAVENOUS | Status: DC
  Administered 2013-11-27: 16:00:00 via INTRAVENOUS

## 2013-11-27 MED ORDER — SODIUM POLYSTYRENE SULFONATE 15 GM/60ML PO SUSP
30.0000 g | Freq: Once | ORAL | Status: AC
  Administered 2013-11-27: 30 g via ORAL

## 2013-11-27 MED ORDER — FUROSEMIDE 10 MG/ML IJ SOLN
20.0000 mg | Freq: Once | INTRAMUSCULAR | Status: AC
  Administered 2013-11-27: 20 mg via INTRAVENOUS
  Filled 2013-11-27: qty 2

## 2013-11-27 MED ORDER — SODIUM POLYSTYRENE SULFONATE 15 GM/60ML PO SUSP
30.0000 g | Freq: Once | ORAL | Status: DC
  Filled 2013-11-27: qty 120

## 2013-11-27 MED ORDER — SODIUM POLYSTYRENE SULFONATE 15 GM/60ML PO SUSP
30.0000 g | Freq: Once | ORAL | Status: AC
  Administered 2013-11-27: 30 g via ORAL
  Filled 2013-11-27: qty 120

## 2013-11-27 MED ORDER — AMOXICILLIN-POT CLAVULANATE 875-125 MG PO TABS: 1.0000 | ORAL_TABLET | Freq: Two times a day (BID) | ORAL | Status: DC

## 2013-11-27 MED ORDER — HYDROMORPHONE HCL 2 MG/ML IJ SOLN
2.0000 mg | Freq: Once | INTRAMUSCULAR | Status: AC
  Administered 2013-11-27: 2 mg via INTRAVENOUS
  Filled 2013-11-27: qty 1

## 2013-11-27 NOTE — Discharge Instructions (Signed)
Anemia, Nonspecific Anemia is a condition in which the concentration of red blood cells or hemoglobin in the blood is below normal. Hemoglobin is a substance in red blood cells that carries oxygen to the tissues of the body. Anemia results in not enough oxygen reaching these tissues.  CAUSES  Common causes of anemia include:   Excessive bleeding. Bleeding may be internal or external. This includes excessive bleeding from periods (in women) or from the intestine.   Poor nutrition.   Chronic kidney, thyroid, and liver disease.  Bone marrow disorders that decrease red blood cell production.  Cancer and treatments for cancer.  HIV, AIDS, and their treatments.  Spleen problems that increase red blood cell destruction.  Blood disorders.  Excess destruction of red blood cells due to infection, medicines, and autoimmune disorders. SIGNS AND SYMPTOMS   Minor weakness.   Dizziness.   Headache.  Palpitations.   Shortness of breath, especially with exercise.   Paleness.  Cold sensitivity.  Indigestion.  Nausea.  Difficulty sleeping.  Difficulty concentrating. Symptoms may occur suddenly or they may develop slowly.  DIAGNOSIS  Additional blood tests are often needed. These help your health care provider determine the best treatment. Your health care provider will check your stool for blood and look for other causes of blood loss.  TREATMENT  Treatment varies depending on the cause of the anemia. Treatment can include:   Supplements of iron, vitamin B12, or folic acid.   Hormone medicines.   A blood transfusion. This may be needed if blood loss is severe.   Hospitalization. This may be needed if there is significant continual blood loss.   Dietary changes.  Spleen removal. HOME CARE INSTRUCTIONS Keep all follow-up appointments. It often takes many weeks to correct anemia, and having your health care provider check on your condition and your response to  treatment is very important. SEEK IMMEDIATE MEDICAL CARE IF:   You develop extreme weakness, shortness of breath, or chest pain.   You become dizzy or have trouble concentrating.  You develop heavy vaginal bleeding.   You develop a rash.   You have bloody or black, tarry stools.   You faint.   You vomit up blood.   You vomit repeatedly.   You have abdominal pain.  You have a fever or persistent symptoms for more than 2-3 days.   You have a fever and your symptoms suddenly get worse.   You are dehydrated.  MAKE SURE YOU:  Understand these instructions.  Will watch your condition.  Will get help right away if you are not doing well or get worse. Document Released: 02/28/2004 Document Revised: 09/22/2012 Document Reviewed: 07/16/2012 Exit[employer] Patient Information 2015 [employer]. This information is not intended to replace advice given to you by your health care provider. Make sure you discuss any questions you have with your health care provider.  

## 2013-11-27 NOTE — Progress Notes (Signed)
Patient ID: [name], male   DOB: Nov 01, 1970, 43 y.o.   MRN: [id number] SICKLE CELL SERVICE PROGRESS NOTE  [name] QPR:[id number] DOB: 1970/08/12 DOA: 11/23/2013 PCP: MATTHEWS,MICHELLE A., MD   Presenting HPI: The patient is a 43 y.o. 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 The patient was last at their baseline health approximately one week ago. He states he developed some diffuse body pains and started taking his lauded so he stopped taking his methadone. 2 days later, he developed rhinorrhea, sinus congestion, cough productive of green sputum, subjective fevers and chills. He also developed some nausea with diarrhea. His symptoms persisted for approximately 3 days and then his respiratory symptoms gradually improved. His diarrhea stopped and his last bowel movement was 2 days ago. His pain has been progressively worsening and his cough has persisted. He was seen in the sickle cell clinic today with 3/10 diffuse body aches. He had some rales on exam and his oxygen levels were low. He had a chest x-ray which demonstrated possible pneumonia versus interstitial fluid. He has had increased lower extremity edema for several months. He denies orthopnea or PND. He is concerned about a bulging around his bellybutton when he coughs.  In clinic, his vital signs are stable except for oxygen saturation of 85% on room air. His chest x-ray demonstrates slight increased pulmonary interstitial densities consistent with possible interstitial edema versus atelectasis or pneumonia in the right infrahilar region. Labs were obtained in clinic and are pending. He was started on gentle IV fluids and ordered for ceftriaxone and azithromycin. He is being admitted for probable  community-acquired pneumonia.   Consultants:  None  Procedures:  None  Antibiotics: Rocephin 10/22 >> 10/24 Azithromycin 10/22 >>   HPI/Subjective: Pt states that his pain is the same and remains tolerable on current regimen. He would like to go home. He refuses to drink Kayexalate that was ordered for this morning.  Objective: Filed Vitals:   11/27/13 0211 11/27/13 0615 11/27/13 1034 11/27/13 1417  BP: 129/69 121/71 113/69 108/53  Pulse: 80 86 77 70  Temp:  98.2 F (36.8 C)  98.1 F (36.7 C)  TempSrc:  Oral  Oral  Resp: $Remo'16 18  20  'NRmua$ Height:      Weight:  165 lb 6.4 oz (75.025 kg)    SpO2: 95% 94%  96%   Weight change: -2 lb (-0.907 kg)  Intake/Output Summary (Last 24 hours) at 11/27/13 1612 Last data filed at 11/27/13 1300  Gross per 24 hour  Intake   1080 ml  Output      0 ml  Net   1080 ml    General: Alert, awake, oriented x3, in no acute distress.  HEENT: [place]/AT PEERL, EOMI Neck: Trachea midline,  no masses, no thyromegal,y no JVD, no carotid bruit OROPHARYNX:  Moist, No exudate/ erythema/lesions.  Heart: Regular rate and rhythm, without murmurs, rubs, gallops.  Lungs: Clear to auscultation, no wheezing or rhonchi noted.  Abdomen: Soft, nontender, nondistended, positive bowel sounds, no masses no hepatosplenomegaly noted.  Neuro: No focal neurological deficits noted cranial nerves II through XII grossly intact. Strength 5 out of 5 in bilateral upper and lower extremities. Musculoskeletal: No warmth swelling or erythema around joints, no spinal tenderness noted.  -unchanged  Data Reviewed: Basic Metabolic Panel:  Recent Labs  Lab 11/23/13 1805 11/24/13 0140 11/26/13 0542 11/26/13 2200 11/27/13 0500 11/27/13 1507  NA 141 137 142  --  143  --   K 4.3 4.4 5.8* 5.6* 5.4* 6.3*  CL 112 108 114*  --  113*  --   CO2 18* 20 21  --  20  --   GLUCOSE 91 103* 91  --  117*  --   BUN $Re'19 18 17  'Rvt$ --  16  --   CREATININE 0.88 0.94 0.89  --  0.81  --   CALCIUM  7.8* 7.9* 8.1*  --  8.1*  --    Liver Function Tests:  Recent Labs Lab 11/23/13 1805 11/24/13 0140  AST 118* 104*  ALT 43 41  ALKPHOS 86 82  BILITOT 5.7* 4.6*  PROT 7.3 7.1  ALBUMIN 2.5* 2.4*   No results found for this basename: LIPASE, AMYLASE,  in the last 168 hours No results found for this basename: AMMONIA,  in the last 168 hours CBC:  Recent Labs Lab 11/23/13 1805 11/24/13 0140 11/24/13 1905 11/25/13 0410 11/26/13 0542 11/27/13 0500  WBC 14.3* 14.4*  --  12.3* 11.8* 11.3*  NEUTROABS 7.0 7.7  --  5.4 5.8 6.2  HGB 6.3* 6.1* 7.3* 6.3* 7.3* 7.8*  HCT 18.5* 18.2* 20.4* 18.7* 21.5* 22.3*  MCV 105.1* 107.1*  --  103.3* 100.0 101.8*  PLT 231 160  --  148* 167 204   Cardiac Enzymes:  Recent Labs Lab 11/23/13 0001 11/23/13 1805 11/24/13 0140  TROPONINI <0.30 <0.30 <0.30   BNP (last 3 results)  Recent Labs  07/06/13 1755 11/23/13 1937  PROBNP 482.9* 892.0*   CBG: No results found for this basename: GLUCAP,  in the last 168 hours  Recent Results (from the past 240 hour(s))  CULTURE, BLOOD (ROUTINE X 2)     Status: None   Collection Time    11/23/13  8:13 PM      Result Value Ref Range Status   Specimen Description BLOOD LEFT HAND   Final   Special Requests BOTTLES DRAWN AEROBIC ONLY 5CC   Final   Culture  Setup Time     Final   Value: 11/24/2013 03:44     Performed at [facility]   Culture     Final   Value:        BLOOD CULTURE RECEIVED NO GROWTH TO DATE CULTURE WILL BE HELD FOR 5 DAYS BEFORE ISSUING A FINAL NEGATIVE REPORT     Performed at [facility]   Report Status PENDING   Incomplete  CULTURE, BLOOD (ROUTINE X 2)     Status: None   Collection Time    11/23/13  8:17 PM      Result Value Ref Range Status   Specimen Description BLOOD RIGHT HAND   Final   Special Requests BOTTLES DRAWN AEROBIC ONLY 5CC   Final   Culture  Setup Time     Final   Value: 11/24/2013 03:44     Performed at [facility]   Culture     Final    Value:        BLOOD CULTURE RECEIVED NO GROWTH TO DATE CULTURE WILL BE HELD FOR 5 DAYS BEFORE ISSUING A FINAL NEGATIVE REPORT     Performed at [facility]   Report Status PENDING   Incomplete  RESPIRATORY VIRUS PANEL     Status: None   Collection Time    11/23/13 11:29 PM  Result Value Ref Range Status   Source - RVPAN NASOPHARYNGEAL   Final   Respiratory Syncytial Virus A NOT DETECTED   Final   Respiratory Syncytial Virus B NOT DETECTED   Final   Influenza A NOT DETECTED   Final   Influenza B NOT DETECTED   Final   Parainfluenza 1 NOT DETECTED   Final   Parainfluenza 2 NOT DETECTED   Final   Parainfluenza 3 NOT DETECTED   Final   Metapneumovirus NOT DETECTED   Final   Rhinovirus NOT DETECTED   Final   Adenovirus NOT DETECTED   Final   Influenza A H1 NOT DETECTED   Final   Influenza A H3 NOT DETECTED   Final   Comment: (NOTE)           Normal Reference Range for each Analyte: NOT DETECTED     Testing performed using the Luminex xTAG Respiratory Viral Panel test     kit.     The analytical performance characteristics of this assay have been     determined by [facility].  The modifications have not been     cleared or approved by the FDA. This assay has been validated pursuant     to the CLIA regulations and is used for clinical purposes.     Performed at [facility]     Studies:  FINDINGS:  There is cephalization of the pulmonary vasculature and slight  indistinctness of the interstitial markings suggestive of mild  pulmonary edema. Small right and trace left pleural effusions. Mild  cardiomegaly. Upper mediastinal contours are within normal limits.  IMPRESSION:  1. Findings remain compatible with mild congestive heart failure.  Dg Chest 2 View  11/23/2013   CLINICAL DATA:  Chest pain with cough and shortness of breath and history of sickle cell anemia.  EXAM: CHEST  2 VIEW  COMPARISON:  PA and lateral chest x-ray of November 16, 2013   FINDINGS: The lungs are well-expanded. The interstitial markings are increased bilaterally and the pulmonary vascularity is more engorged. The cardiopericardial silhouette remains enlarged. Slightly increased right infrahilar lung markings are noted. There is persistent blunting of the right lateral costophrenic angle. The bony thorax exhibits no acute abnormality.  IMPRESSION: Slightly increased pulmonary interstitial densities are noted bilaterally which may reflect worsening of interstitial edema. Atelectasis or pneumonia in the right infrahilar region is suspected. Follow-up films following therapy are recommended.   Electronically Signed   By: David  Martinique   On: 11/23/2013 16:30   Dg Chest 2 View  11/16/2013   CLINICAL DATA:  Chest pain today, sickle cell crisis  EXAM: CHEST  2 VIEW  COMPARISON:  07/06/2013  FINDINGS: Cardiomediastinal silhouette is stable. No acute infiltrate or pleural effusion. No pulmonary edema. Stable chronic blunting of the right costophrenic angle. Bony thorax is unremarkable.  IMPRESSION: No active disease. Stable chronic blunting of the right costophrenic angle.   Electronically Signed   By: Lahoma Crocker M.D.   On: 11/16/2013 20:25    Scheduled Meds: . allopurinol  300 mg Oral Daily  . amoxicillin-clavulanate  1 tablet Oral Q12H  . azithromycin  500 mg Oral Daily  . enoxaparin (LOVENOX) injection  40 mg Subcutaneous Q24H  . folic acid  1 mg Oral Daily  .  HYDROmorphone (DILAUDID) injection  2 mg Intravenous Once  . HYDROmorphone  4 mg Oral Q4H  . methadone  10 mg Oral QHS  . metoprolol  50 mg Oral Daily  .  pantoprazole  40 mg Oral Daily  . sodium bicarbonate  650 mg Oral BID  . sodium polystyrene  30 g Oral Once  . Vitamin D (Ergocalciferol)  50,000 Units Oral Q Wed  . zolpidem  10 mg Oral QHS   Continuous Infusions: . dextrose 5 % and 0.45% NaCl      Active Problems:   Leukocytosis   Sickle cell crisis   SVT (supraventricular tachycardia)   Generalized  anxiety disorder   CAP (community acquired pneumonia)   Acute respiratory failure with hypoxia   Renal tubular acidosis   Assessment/Plan: Active Problems:   Leukocytosis   Sickle cell crisis   SVT (supraventricular tachycardia)   Generalized anxiety disorder   CAP (community acquired pneumonia)   Acute respiratory failure with hypoxia   Renal tubular acidosis   1)Hyperkalemia: Potassium was 5.4 this morning and now 6.3 this afternoon. Likely due to extensive hemolysis, however will look for other causes. Obtain UA, urine electrolytes, and repeat BMP. EKG was negative for any T-wave abnormality yesterday. Will give another dose of Kayexalate 30Gm, and pt will receive Lasix as above, which should help remove potassium. Pt has been placed back on telemetry. Continue sodium bicarbonate. Discontinued Ibuprofen. Avoid nephrotoxins. If worsening will consider renal consult.  2)Community acquired Pneumonia: Overall respiratory status is improving. Continue Azithromycin and Augmentin $RemoveBefor'875mg'xgKJMTgTVyxH$  q12h. Oxygen saturation about 96% on RA. Repeat CXR done this AM has improved but shows signs of pulmonary edema.  3) Pulmonary Edema: Pt has increased interstitial markings on CXR. Will give 1 dose of IV Lasix $Remove'20mg'Ltsgnmn$  and re-assess.  4)Hb SS with crisis: Pt's pain is minimal and well controlled rating between 2-4/10. Continue PO Dilaudid and Dilaudid IV $RemoveBef'2mg'YsXzxXhcSO$  q3h PRN for increased pain.   5)Anemia: Hgb of 7.8 this morning. Received a total of 2 units PRBC during this admission. Continue to monitor.  6) FEN/GI :Continue Drisdol. Has reducible umbilical hernia. He should have that f/u as an out patient. Regular Diet  IV fluids-KVO Bowel regimen in place  Code Status: full code DVT Prophylaxis: enoxaparin Family Communication: none Disposition Plan: pending improvement in potassium.  Kalman Shan  Pager [phone]. If 7PM-7AM, please contact night-coverage.  11/27/2013, 4:12 PM  LOS: 4 days   Kalman Shan

## 2013-11-28 ENCOUNTER — Other Ambulatory Visit: Payer: BC Managed Care – PPO

## 2013-11-28 LAB — CBC WITH DIFFERENTIAL/PLATELET
BASOS PCT: 1 % (ref 0–1)
Basophils Absolute: 0.1 10*3/uL (ref 0.0–0.1)
EOS PCT: 6 % — AB (ref 0–5)
Eosinophils Absolute: 0.7 10*3/uL (ref 0.0–0.7)
HCT: 21.2 % — ABNORMAL LOW (ref 39.0–52.0)
Hemoglobin: 7.2 g/dL — ABNORMAL LOW (ref 13.0–17.0)
Lymphocytes Relative: 39 % (ref 12–46)
Lymphs Abs: 4.7 10*3/uL — ABNORMAL HIGH (ref 0.7–4.0)
MCH: 34.1 pg — ABNORMAL HIGH (ref 26.0–34.0)
MCHC: 34 g/dL (ref 30.0–36.0)
MCV: 100.5 fL — AB (ref 78.0–100.0)
MONOS PCT: 10 % (ref 3–12)
Monocytes Absolute: 1.2 10*3/uL — ABNORMAL HIGH (ref 0.1–1.0)
NEUTROS ABS: 5.4 10*3/uL (ref 1.7–7.7)
Neutrophils Relative %: 44 % (ref 43–77)
PLATELETS: 228 10*3/uL (ref 150–400)
RBC: 2.11 MIL/uL — AB (ref 4.22–5.81)
RDW: 26.7 % — ABNORMAL HIGH (ref 11.5–15.5)
WBC: 12.1 10*3/uL — AB (ref 4.0–10.5)
nRBC: 8 /100 WBC — ABNORMAL HIGH

## 2013-11-28 LAB — BASIC METABOLIC PANEL
Anion gap: 9 (ref 5–15)
BUN: 19 mg/dL (ref 6–23)
CO2: 24 meq/L (ref 19–32)
CREATININE: 0.97 mg/dL (ref 0.50–1.35)
Calcium: 8.3 mg/dL — ABNORMAL LOW (ref 8.4–10.5)
Chloride: 111 mEq/L (ref 96–112)
GFR calc non Af Amer: >90 mL/min (ref >90)
Glucose, Bld: 91 mg/dL (ref 70–99)
POTASSIUM: 5.4 meq/L — AB (ref 3.7–5.3)
Sodium: 144 mEq/L (ref 137–147)

## 2013-11-28 LAB — POTASSIUM: Potassium: 5.6 mEq/L — ABNORMAL HIGH (ref 3.7–5.3)

## 2013-11-28 LAB — URINALYSIS, ROUTINE W REFLEX MICROSCOPIC
Bilirubin Urine: NEGATIVE
Glucose, UA: NEGATIVE mg/dL
KETONES UR: NEGATIVE mg/dL
LEUKOCYTES UA: NEGATIVE
NITRITE: NEGATIVE
Protein, ur: 30 mg/dL — AB
Specific Gravity, Urine: 1.007 (ref 1.005–1.030)
UROBILINOGEN UA: 0.2 mg/dL (ref 0.0–1.0)
pH: 6 (ref 5.0–8.0)

## 2013-11-28 LAB — URINE MICROSCOPIC-ADD ON

## 2013-11-28 LAB — LACTATE DEHYDROGENASE: LDH: 958 U/L — ABNORMAL HIGH (ref 94–250)

## 2013-11-28 LAB — NA AND K (SODIUM & POTASSIUM), RAND UR
POTASSIUM UR: 13 meq/L
SODIUM UR: 138 meq/L

## 2013-11-28 LAB — CHLORIDE, URINE, RANDOM: Chloride Urine: 151 mEq/L

## 2013-11-28 LAB — CREATININE, URINE, RANDOM: Creatinine, Urine: 15.7 mg/dL

## 2013-11-28 MED ORDER — FUROSEMIDE 10 MG/ML IJ SOLN
20.0000 mg | Freq: Once | INTRAMUSCULAR | Status: AC
Start: 2013-11-28 — End: 2013-11-28
  Administered 2013-11-28: 20 mg via INTRAVENOUS
  Filled 2013-11-28: qty 2

## 2013-11-28 NOTE — Discharge Summary (Signed)
Physician Discharge Summary  [name] KXF:[id number] DOB: 1970-09-26 DOA: 11/23/2013  PCP: MATTHEWS,MICHELLE A., MD  Admit date: 11/23/2013 Discharge date: 11/28/2013  Discharge Diagnoses:  Active Problems:   Leukocytosis   Sickle cell crisis   SVT (supraventricular tachycardia)   Generalized anxiety disorder   CAP (community acquired pneumonia)   Acute respiratory failure with hypoxia   Renal tubular acidosis   Discharge Condition: stable  Disposition:  Follow-up Information   Follow up with MATTHEWS,MICHELLE A., MD In 1 day. (for potassium check)    Specialty:  Internal Medicine   Contact information:   [address] [place] [id number] [phone]       Diet:General  Wt Readings from Last 3 Encounters:  11/28/13 162 lb 3.2 oz (73.573 kg)  11/23/13 175 lb (79.379 kg)  11/14/13 173 lb (78.472 kg)    History of present illness:  The patient is a 43 y.o. 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 The patient was last at their baseline health approximately one week ago. He states he developed some diffuse body pains and started taking his lauded so he stopped taking his methadone. 2 days later, he developed rhinorrhea, sinus congestion, cough productive of green sputum, subjective fevers and chills. He also developed some nausea with diarrhea. His symptoms persisted for approximately 3 days and then his respiratory symptoms gradually improved. His diarrhea stopped and his last bowel movement was 2 days ago. His pain has been progressively worsening and his cough has persisted. He was seen in the sickle cell clinic today with 3/10 diffuse body aches. He had some rales on exam and his oxygen levels were low. He had a chest  x-ray which demonstrated possible pneumonia versus interstitial fluid. He has had increased lower extremity edema for several months. He denies orthopnea or PND. He is concerned about a bulging around his bellybutton when he coughs.  In clinic, his vital signs are stable except for oxygen saturation of 85% on room air. His chest x-ray demonstrates slight increased pulmonary interstitial densities consistent with possible interstitial edema versus atelectasis or pneumonia in the right infrahilar region. Labs were obtained in clinic and are pending. He was started on gentle IV fluids and ordered for ceftriaxone and azithromycin. He is being admitted for probable community-acquired pneumonia.   Hospital Course:  Pneumonia: Patient presented with oxygen saturation of 85% on room air. His chest x-ray demonstrates slight increased pulmonary interstitial densities consistent with possible interstitial edema versus atelectasis or pneumonia in the right infrahilar region. He was started on ceftriaxone and azithromycin. Ceftriaxone was switched to Augmentin on 10/24.  He finished five day course of azithromycin in hospital. He was sent a prescription to finish another five days of Augmentin to complete a 10 day course of antibiotics for treatment of his pneumonia. His respiratory status also improved with two 51m IV Lasix, which suggest a component of pulmonary edema was playing a role. His TTE done on 10/22 showed a normal LVEF of 60-65%.   Sickle Cell Crisis: Patient presented with pain characteristic of acute vaso-occlusive crisis.Pt's pain was treated with bolus IV analgesics initially and was later transitioned to Dilaudid PCA and ketorolac. He was continued on his home dose of Methadone. PCA was stopped as pain improved and his was started on his home  dose of Hydromorphone. His pain was well controlled with his oral home regimen. He had overall improvement of his pain and was physically functional upon  discharge.  Anemia:Pt had a nadir Hgb of 6.1. Anemia was likely due to hemolysis as his LDH reached a peak of 1117. Due to concern for ACS, he required a total of 2 units PRBC to reach a Hgb >7. His Hgb remained stable for a few days prior to discharge.  Hyperkalemia: Likely due to extensive hemolysis, his potassium reached a peak of 6.3. He was given several doses of Kayexalate and Lasix to remove the extra total body potassium. Ibuprofen was discontinued and nephrotoxins were avoided. He had a normal urinary excretion of potassium (levels obtained prior to Lasix). An EKG was obtained and negative for any T-wave and he was monitored on telemetry. He should continue sodium bicarbonate and will come to the sickle cell center in 1 day for a potassium check.   Discharge Exam:  Filed Vitals:   11/28/13 0619  BP: 116/58  Pulse: 72  Temp: 98.1 F (36.7 C)  Resp: 16   Filed Vitals:   11/27/13 1034 11/27/13 1417 11/27/13 2145 11/28/13 0619  BP: 113/69 108/53 120/56 116/58  Pulse: 77 70 70 72  Temp:  98.1 F (36.7 C) 98.4 F (36.9 C) 98.1 F (36.7 C)  TempSrc:  Oral Oral Oral  Resp:  _0 Height:      Weight:    162 lb 3.2 oz (73.573 kg)  SpO2:  96% 93% 94%   General: Alert, awake, oriented x3, in no acute distress.  HEENT: [place]/AT PEERL, EOMI Neck: Trachea midline,  no masses, no thyromegal,y no JVD, no carotid bruit OROPHARYNX:  Moist, No exudate/ erythema/lesions.  Heart: Regular rate and rhythm, without murmurs, rubs, gallops Lungs: soft crackles in LL lobes. No increased vocal fremitus resonant to percussion  Abdomen: Soft, nontender, nondistended, positive bowel sounds, no masses no hepatosplenomegaly noted..  Neuro: No focal neurological deficits noted cranial nerves II through XII grossly intact. DTRs 2+ bilaterally upper and lower extremities. Strength 5 out of 5 in bilateral upper and lower extremities. Nl gait. Musculoskeletal: No warm swelling or erythema around joints, no  spinal tenderness noted. Psychiatric: Patient alert and oriented x3, good insight and cognition, good recent to remote recall. Lymph node survey: No cervical axillary or inguinal lymphadenopathy noted.   Discharge Instructions Have Potassium checked in 1 day    Medication List         allopurinol 300 MG tablet  Commonly known as:  ZYLOPRIM  Take 1 tablet (300 mg total) by mouth daily.     ALPRAZolam 0.25 MG tablet  Commonly known as:  XANAX  Take 0.25 mg by mouth at bedtime as needed for anxiety (anxiety).     amoxicillin-clavulanate 875-125 MG per tablet  Commonly known as:  AUGMENTIN  Take 1 tablet by mouth every 12 (twelve) hours.     ergocalciferol 50000 UNITS capsule  Commonly known as:  DRISDOL  Take 1 capsule (50,000 Units total) by mouth once a week.     folic acid 1 MG tablet  Commonly known as:  FOLVITE  Take 1 tablet (1 mg total) by mouth daily.     HYDROmorphone 4 MG tablet  Commonly known as:  DILAUDID  Take 4 mg by mouth every 4 (four) hours as needed for severe pain (pain).     ibuprofen 800 MG tablet  Commonly known as:  ADVIL,MOTRIN  Take  800 mg by mouth every 8 (eight) hours as needed for fever or moderate pain (pain & fever).     methadone 10 MG tablet  Commonly known as:  DOLOPHINE  Take 1 tablet (10 mg total) by mouth at bedtime.     metoprolol 50 MG tablet  Commonly known as:  LOPRESSOR  Take 1 tablet (50 mg total) by mouth daily.     sodium bicarbonate 325 MG tablet  Take 1 tablet (325 mg total) by mouth daily.     sodium polystyrene 15 GM/60ML suspension  Commonly known as:  KAYEXALATE  Take 120 mLs (30 g total) by mouth once.     zolpidem 10 MG tablet  Commonly known as:  AMBIEN  Take 10 mg by mouth at bedtime as needed for sleep (insomnia).          The results of significant diagnostics from this hospitalization (including imaging, microbiology, ancillary and laboratory) are listed below for reference.    Significant  Diagnostic Studies: Dg Chest 2 View  11/27/2013   CLINICAL DATA:  43 year old male with cough. History of congestive heart failure.  EXAM: CHEST  2 VIEW  COMPARISON:  Chest x-ray 11/23/2013.  FINDINGS: There is cephalization of the pulmonary vasculature and slight indistinctness of the interstitial markings suggestive of mild pulmonary edema. Small right and trace left pleural effusions. Mild cardiomegaly. Upper mediastinal contours are within normal limits.  IMPRESSION: 1. Findings remain compatible with mild congestive heart failure.   Electronically Signed   By: Vinnie Langton M.D.   On: 11/27/2013 08:52   Dg Chest 2 View  11/23/2013   CLINICAL DATA:  Chest pain with cough and shortness of breath and history of sickle cell anemia.  EXAM: CHEST  2 VIEW  COMPARISON:  PA and lateral chest x-ray of November 16, 2013  FINDINGS: The lungs are well-expanded. The interstitial markings are increased bilaterally and the pulmonary vascularity is more engorged. The cardiopericardial silhouette remains enlarged. Slightly increased right infrahilar lung markings are noted. There is persistent blunting of the right lateral costophrenic angle. The bony thorax exhibits no acute abnormality.  IMPRESSION: Slightly increased pulmonary interstitial densities are noted bilaterally which may reflect worsening of interstitial edema. Atelectasis or pneumonia in the right infrahilar region is suspected. Follow-up films following therapy are recommended.   Electronically Signed   By: David  Martinique   On: 11/23/2013 16:30   Dg Chest 2 View  11/16/2013   CLINICAL DATA:  Chest pain today, sickle cell crisis  EXAM: CHEST  2 VIEW  COMPARISON:  07/06/2013  FINDINGS: Cardiomediastinal silhouette is stable. No acute infiltrate or pleural effusion. No pulmonary edema. Stable chronic blunting of the right costophrenic angle. Bony thorax is unremarkable.  IMPRESSION: No active disease. Stable chronic blunting of the right costophrenic  angle.   Electronically Signed   By: Lahoma Crocker M.D.   On: 11/16/2013 20:25    Microbiology: Recent Results (from the past 240 hour(s))  CULTURE, BLOOD (ROUTINE X 2)     Status: None   Collection Time    11/23/13  8:13 PM      Result Value Ref Range Status   Specimen Description BLOOD LEFT HAND   Final   Special Requests BOTTLES DRAWN AEROBIC ONLY 5CC   Final   Culture  Setup Time     Final   Value: 11/24/2013 03:44     Performed at [facility]   Culture     Final   Value:  BLOOD CULTURE RECEIVED NO GROWTH TO DATE CULTURE WILL BE HELD FOR 5 DAYS BEFORE ISSUING A FINAL NEGATIVE REPORT     Performed at [facility]   Report Status PENDING   Incomplete  CULTURE, BLOOD (ROUTINE X 2)     Status: None   Collection Time    11/23/13  8:17 PM      Result Value Ref Range Status   Specimen Description BLOOD RIGHT HAND   Final   Special Requests BOTTLES DRAWN AEROBIC ONLY 5CC   Final   Culture  Setup Time     Final   Value: 11/24/2013 03:44     Performed at [facility]   Culture     Final   Value:        BLOOD CULTURE RECEIVED NO GROWTH TO DATE CULTURE WILL BE HELD FOR 5 DAYS BEFORE ISSUING A FINAL NEGATIVE REPORT     Performed at [facility]   Report Status PENDING   Incomplete  RESPIRATORY VIRUS PANEL     Status: None   Collection Time    11/23/13 11:29 PM      Result Value Ref Range Status   Source - RVPAN NASOPHARYNGEAL   Final   Respiratory Syncytial Virus A NOT DETECTED   Final   Respiratory Syncytial Virus B NOT DETECTED   Final   Influenza A NOT DETECTED   Final   Influenza B NOT DETECTED   Final   Parainfluenza 1 NOT DETECTED   Final   Parainfluenza 2 NOT DETECTED   Final   Parainfluenza 3 NOT DETECTED   Final   Metapneumovirus NOT DETECTED   Final   Rhinovirus NOT DETECTED   Final   Adenovirus NOT DETECTED   Final   Influenza A H1 NOT DETECTED   Final   Influenza A H3 NOT DETECTED   Final   Comment: (NOTE)           Normal  Reference Range for each Analyte: NOT DETECTED     Testing performed using the Luminex xTAG Respiratory Viral Panel test     kit.     The analytical performance characteristics of this assay have been     determined by [facility].  The modifications have not been     cleared or approved by the FDA. This assay has been validated pursuant     to the CLIA regulations and is used for clinical purposes.     Performed at [facility]: Basic Metabolic Panel:  Recent Labs Lab 11/24/13 0140 11/26/13 0542  11/27/13 0500  11/27/13 1850 11/28/13 0010 11/28/13 0430  NA 137 142  --  143  --  141  --  144  K 4.4 5.8*  < > 5.4*  < > 6.0* 5.6* 5.4*  CL 108 114*  --  113*  --  110  --  111  CO2 20 21  --  20  --  23  --  24  GLUCOSE 103* 91  --  117*  --  96  --  91  BUN 18 17  --  16  --  16  --  19  CREATININE 0.94 0.89  --  0.81  --  0.85  --  0.97  CALCIUM 7.9* 8.1*  --  8.1*  --  8.4  --  8.3*  < > = values in this interval not displayed. Liver Function Tests:  Recent Labs Lab  11/23/13 1805 11/24/13 0140  AST 118* 104*  ALT 43 41  ALKPHOS 86 82  BILITOT 5.7* 4.6*  PROT 7.3 7.1  ALBUMIN 2.5* 2.4*   No results found for this basename: LIPASE, AMYLASE,  in the last 168 hours No results found for this basename: AMMONIA,  in the last 168 hours CBC:  Recent Labs Lab 11/24/13 0140 11/24/13 1905 11/25/13 0410 11/26/13 0542 11/27/13 0500 11/28/13 0430  WBC 14.4*  --  12.3* 11.8* 11.3* 12.1*  NEUTROABS 7.7  --  5.4 5.8 6.2 5.4  HGB 6.1* 7.3* 6.3* 7.3* 7.8* 7.2*  HCT 18.2* 20.4* 18.7* 21.5* 22.3* 21.2*  MCV 107.1*  --  103.3* 100.0 101.8* 100.5*  PLT 160  --  148* 167 204 228   Cardiac Enzymes:  Recent Labs Lab 11/23/13 0001 11/23/13 1805 11/24/13 0140  TROPONINI <0.30 <0.30 <0.30    Time coordinating discharge: >1hr  Signed:  Kalman Shan  11/28/2013, 10:45 AM

## 2013-11-28 NOTE — Progress Notes (Signed)
Doctor went over all discharge information with pt. this morning and gave the patient the discharge summary.  I also went over discharge instructions and answered all questions.  Removed IV and tele.  IV site is clean dry and intact.  Pt. Was wheeled out by another nurse.  VSS.

## 2013-11-30 ENCOUNTER — Telehealth (HOSPITAL_COMMUNITY): Payer: Self-pay | Admitting: Internal Medicine

## 2013-11-30 LAB — CULTURE, BLOOD (ROUTINE X 2)
CULTURE: NO GROWTH
Culture: NO GROWTH

## 2013-11-30 NOTE — Telephone Encounter (Signed)
I called the number on file for Mr. [name]. He was discharged on 10/26 with [facility]. [facility]. Pt agreed to this at discharge after refusing a potassium check while in the hospital. He did not show for his lab check, so he was called today 10/28. A message was left urging he have his PCP check his potassium ASAP and that high levels of potassium can lead to cardiac arrhythmias and subsequent death.  Serina Cowperlabunmi Aldene Hendon, MD

## 2013-12-26 ENCOUNTER — Other Ambulatory Visit: Payer: Self-pay

## 2013-12-26 ENCOUNTER — Telehealth: Payer: Self-pay | Admitting: Internal Medicine

## 2013-12-26 DIAGNOSIS — G4700 Insomnia, unspecified: Secondary | ICD-10-CM

## 2013-12-26 DIAGNOSIS — D571 Sickle-cell disease without crisis: Secondary | ICD-10-CM

## 2013-12-26 DIAGNOSIS — R000 Tachycardia, unspecified: Secondary | ICD-10-CM

## 2013-12-26 MED ORDER — METOPROLOL TARTRATE 50 MG PO TABS
50.0000 mg | ORAL_TABLET | Freq: Every day | ORAL | Status: DC
Start: 2013-12-26 — End: 2014-01-30

## 2013-12-26 NOTE — Telephone Encounter (Signed)
Dr. Ashley RoyaltyMatthews, patient is requesting refill of methadone 10mg  and ambien 10mg . LOV 11/23/13... With [facility]. Please advise. I will send metoprolol in. Thanks!

## 2013-12-27 DIAGNOSIS — G4700 Insomnia, unspecified: Secondary | ICD-10-CM | POA: Insufficient documentation

## 2013-12-27 MED ORDER — METHADONE HCL 10 MG PO TABS: 10.0000 mg | ORAL_TABLET | Freq: Every day | ORAL | Status: DC

## 2013-12-27 MED ORDER — ZOLPIDEM TARTRATE 10 MG PO TABS: 10.0000 mg | ORAL_TABLET | Freq: Every evening | ORAL | Status: DC | PRN

## 2013-12-27 NOTE — Telephone Encounter (Signed)
Prescription written for Methadone 10 mg #30 tabs and Ambien 10 mg #30 tabs with 3 refills.

## 2013-12-28 ENCOUNTER — Telehealth: Payer: Self-pay | Admitting: Internal Medicine

## 2013-12-28 DIAGNOSIS — F064 Anxiety disorder due to known physiological condition: Secondary | ICD-10-CM

## 2013-12-28 NOTE — Telephone Encounter (Signed)
Refill request for alprazolam 0.25mg . LOV 11/27/2013 with Armeniahina. Please advise. Thanks

## 2014-01-02 DIAGNOSIS — F064 Anxiety disorder due to known physiological condition: Secondary | ICD-10-CM | POA: Insufficient documentation

## 2014-01-02 MED ORDER — ALPRAZOLAM 0.25 MG PO TABS: 0.2500 mg | ORAL_TABLET | Freq: Two times a day (BID) | ORAL | Status: DC | PRN

## 2014-01-02 NOTE — Telephone Encounter (Signed)
Prescription re-written for Xanax 0.25 mg BID PRN anxiety #60 tabs with 3 refills.

## 2014-01-30 ENCOUNTER — Other Ambulatory Visit: Payer: Self-pay

## 2014-01-30 ENCOUNTER — Telehealth: Payer: Self-pay

## 2014-01-30 DIAGNOSIS — R000 Tachycardia, unspecified: Secondary | ICD-10-CM

## 2014-01-30 DIAGNOSIS — F064 Anxiety disorder due to known physiological condition: Secondary | ICD-10-CM

## 2014-01-30 DIAGNOSIS — D571 Sickle-cell disease without crisis: Secondary | ICD-10-CM

## 2014-01-30 MED ORDER — IBUPROFEN 800 MG PO TABS: 800.0000 mg | ORAL_TABLET | Freq: Three times a day (TID) | ORAL | Status: DC | PRN

## 2014-01-30 MED ORDER — METOPROLOL TARTRATE 50 MG PO TABS: 50.0000 mg | ORAL_TABLET | Freq: Every day | ORAL | Status: AC

## 2014-01-30 NOTE — Telephone Encounter (Signed)
Refill request for Methadone, xanax, ibuprofen, metoprolol, and hydrea. LOV with Dr. Ashley RoyaltyMatthews:   01/02/2014. Please advise. Thanks!

## 2014-01-31 MED ORDER — ALPRAZOLAM 0.25 MG PO TABS: 0.2500 mg | ORAL_TABLET | Freq: Two times a day (BID) | ORAL | Status: DC | PRN

## 2014-01-31 MED ORDER — METHADONE HCL 10 MG PO TABS: 10.0000 mg | ORAL_TABLET | Freq: Every day | ORAL | Status: DC

## 2014-01-31 NOTE — Telephone Encounter (Signed)
Meds ordered this encounter  Medications  . ibuprofen (ADVIL,MOTRIN) 800 MG tablet    Sig: Take 1 tablet (800 mg total) by mouth every 8 (eight) hours as needed for fever or moderate pain (pain & fever).    Dispense:  30 tablet    Refill:  2  . metoprolol (LOPRESSOR) 50 MG tablet    Sig: Take 1 tablet (50 mg total) by mouth daily.    Dispense:  60 tablet    Refill:  3  . methadone (DOLOPHINE) 10 MG tablet    Sig: Take 1 tablet (10 mg total) by mouth at bedtime.    Dispense:  30 tablet    Refill:  0    Order Specific Question:  Supervising Provider    Answer:  MATTHEWS, MICHELLE A [[id number]]  . ALPRAZolam (XANAX) 0.25 MG tablet    Sig: Take 1 tablet (0.25 mg total) by mouth 2 (two) times daily as needed for anxiety (anxiety). For anxiety    Dispense:  60 tablet    Refill:  3    Order Specific Question:  Supervising Provider    Answer:  Marthann SchillerMATTHEWS, MICHELLE A [[id number]]  Reviewed [place] Substance Reporting system prior to reorder Massie MaroonHollis,Davarion Cuffee M, FNP

## 2014-02-16 ENCOUNTER — Ambulatory Visit (INDEPENDENT_AMBULATORY_CARE_PROVIDER_SITE_OTHER): Payer: BC Managed Care – PPO | Admitting: Internal Medicine

## 2014-02-16 VITALS — BP 121/67 | HR 80 | Temp 99.1°F | Resp 16 | Ht 71.0 in | Wt 176.0 lb

## 2014-02-16 DIAGNOSIS — I4581 Long QT syndrome: Secondary | ICD-10-CM

## 2014-02-16 DIAGNOSIS — D571 Sickle-cell disease without crisis: Secondary | ICD-10-CM

## 2014-02-16 DIAGNOSIS — D638 Anemia in other chronic diseases classified elsewhere: Secondary | ICD-10-CM

## 2014-02-16 DIAGNOSIS — E559 Vitamin D deficiency, unspecified: Secondary | ICD-10-CM

## 2014-02-16 DIAGNOSIS — R9431 Abnormal electrocardiogram [ECG] [EKG]: Secondary | ICD-10-CM

## 2014-02-16 DIAGNOSIS — T887XXD Unspecified adverse effect of drug or medicament, subsequent encounter: Secondary | ICD-10-CM

## 2014-02-16 DIAGNOSIS — R809 Proteinuria, unspecified: Secondary | ICD-10-CM

## 2014-02-16 DIAGNOSIS — E875 Hyperkalemia: Secondary | ICD-10-CM

## 2014-02-16 DIAGNOSIS — T50905D Adverse effect of unspecified drugs, medicaments and biological substances, subsequent encounter: Secondary | ICD-10-CM

## 2014-02-16 LAB — BASIC METABOLIC PANEL
BUN: 20 mg/dL (ref 6–23)
CALCIUM: 8.2 mg/dL — AB (ref 8.4–10.5)
CO2: 22 mEq/L (ref 19–32)
Chloride: 103 mEq/L (ref 96–112)
Creat: 0.99 mg/dL (ref 0.50–1.35)
Glucose, Bld: 85 mg/dL (ref 70–99)
Potassium: 5.7 mEq/L — ABNORMAL HIGH (ref 3.5–5.3)
Sodium: 130 mEq/L — ABNORMAL LOW (ref 135–145)

## 2014-02-16 LAB — FERRITIN: Ferritin: 3595 ng/mL — ABNORMAL HIGH (ref 22–322)

## 2014-02-16 LAB — LACTATE DEHYDROGENASE: LDH: 815 U/L — ABNORMAL HIGH (ref 94–250)

## 2014-02-16 MED ORDER — HYDROMORPHONE HCL 4 MG PO TABS: 4.0000 mg | ORAL_TABLET | ORAL | Status: AC | PRN

## 2014-02-16 MED ORDER — HYDROXYUREA 500 MG PO CAPS: 1000.0000 mg | ORAL_CAPSULE | Freq: Every day | ORAL | Status: DC

## 2014-02-16 NOTE — Progress Notes (Signed)
Patient ID: Dejon Lukas, male   DOB: 03/21/1970, 44 y.o.   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 He has had several small crises which he was able to manage at home with his oral medications. He however increased his dose of Methadone as he did not have any Hydromorphone at home (short acting analgesic). He apparently mixed up the names Hydromorphone and Hydroxyurea and erroneously requested Hydroxyurea instead of Hydromorphone thus he was without it.  He has had no palpitations, near syncope or excessive drowsiness.   Allergies  Allergen Reactions  . Toradol [Ketorolac Tromethamine]     Pt stated "it scars my kidneys"    Past Medical History  Diagnosis Date  . CHF (congestive heart failure)   . Gouty arthropathy   . Chronic fatigue syndrome   . Sickle cell anemia   . Anxiety   . Depression   . Hand joint pain 10/19/2012  . Hyperkalemia 03/25/2011  . SVT (supraventricular tachycardia)   . Renal tubular acidosis    Current Outpatient Prescriptions on File Prior to Visit  Medication Sig Dispense Refill  . allopurinol (ZYLOPRIM) 300 MG tablet Take 1 tablet (300 mg total) by mouth daily. 30 tablet 2  . ALPRAZolam (XANAX) 0.25 MG tablet Take 1 tablet (0.25 mg total) by mouth 2 (two) times daily as needed for anxiety (anxiety). For anxiety 60 tablet 3  . ergocalciferol (DRISDOL) 50000 UNITS capsule Take 1 capsule (50,000 Units total) by mouth once a week. 4 capsule 12  . folic acid (FOLVITE) 1 MG tablet Take 1 tablet (1 mg total) by mouth daily. 30 tablet 11  . ibuprofen (ADVIL,MOTRIN) 800 MG tablet Take 1 tablet (800 mg total) by mouth every 8 (eight) hours as needed for fever or moderate pain (pain & fever). 30 tablet 2  . methadone (DOLOPHINE) 10 MG tablet Take 1 tablet (10 mg total) by mouth at bedtime. 30 tablet 0  . metoprolol (LOPRESSOR) 50 MG tablet Take 1 tablet (50 mg total) by mouth daily. 60  tablet 3  . sodium bicarbonate 325 MG tablet Take 1 tablet (325 mg total) by mouth daily. 90 tablet 3  . zolpidem (AMBIEN) 10 MG tablet Take 1 tablet (10 mg total) by mouth at bedtime as needed for sleep (insomnia). 30 tablet 3  . amoxicillin-clavulanate (AUGMENTIN) 875-125 MG per tablet Take 1 tablet by mouth every 12 (twelve) hours. (Patient not taking: Reported on 02/16/2014) 12 tablet 0  . sodium polystyrene (KAYEXALATE) 15 GM/60ML suspension Take 120 mLs (30 g total) by mouth once. (Patient not taking: Reported on 02/16/2014) 120 mL 1   No current facility-administered medications on file prior to visit.   Family History  Problem Relation Age of Onset  . Sickle cell anemia    . Sickle cell trait Mother    History   Social History  . Marital Status: Single    Spouse Name: N/A    Number of Children: N/A  . Years of Education: N/A   Occupational History  . Not on file.   Social History Main Topics  . Smoking status: Never Smoker   . Smokeless tobacco: Never Used  . Alcohol Use: No  . Drug Use: No  . Sexual Activity: No   Other Topics Concern  . 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  Estranged from his biological  father.      Review of Systems: Constitutional: Negative for fever, chills, diaphoresis, activity change, appetite change and fatigue. HENT: Negative for ear pain, nosebleeds, congestion, facial swelling, rhinorrhea, neck pain, neck stiffness and ear discharge.  Eyes: Negative for pain, discharge, redness, itching and visual disturbance. Respiratory: Negative for cough, choking, chest tightness, shortness of breath, wheezing and stridor.  Cardiovascular: Negative for chest pain, palpitations and leg swelling. Gastrointestinal: Negative for abdominal distention. Genitourinary: Negative for dysuria, urgency, frequency, hematuria, flank pain, decreased urine volume, difficulty urinating.  Musculoskeletal: Negative for back pain, joint swelling,  arthralgias and gait problem. Neurological: Negative for dizziness, tremors, seizures, syncope, facial asymmetry, speech difficulty, weakness, light-headedness, numbness and headaches.  Hematological: Negative for adenopathy. Does not bruise/bleed easily. Psychiatric/Behavioral: Negative for hallucinations, behavioral problems, confusion, dysphoric mood, decreased concentration and agitation.    Objective:   Filed Vitals:   02/16/14 1524  BP: 121/67  Pulse: 80  Temp: 99.1 F (37.3 C)  Resp: 16    Physical Exam: Constitutional: Patient appears well-developed and well-nourished. No distress. HENT: Normocephalic, atraumatic, External right and left ear normal. Oropharynx is clear and moist.  Eyes: Conjunctivae and EOM are normal. PERRLA,.Mild icterus at baseline. Neck: Normal ROM. Neck supple. No JVD. No tracheal deviation. No thyromegaly. CVS: RRR, S1/S2 +, no murmurs, no gallops, no carotid bruit.  Pulmonary: Effort and breath sounds normal, no stridor, rhonchi, wheezes, rales.  Abdominal: Soft. BS +,  no distension, tenderness, rebound or guarding.  Musculoskeletal: Normal range of motion. No edema and no tenderness.  Lymphadenopathy: No lymphadenopathy noted, cervical, inguinal or axillary Neuro: Alert. Normal reflexes, muscle tone coordination. No cranial nerve deficit. Skin: Skin is warm and dry. No rash noted. Not diaphoretic. No erythema. No pallor. Psychiatric: Normal mood and affect. Behavior, judgment, thought content normal.  Lab Results  Component Value Date   WBC 17.4* 02/16/2014   HGB 6.5* 02/16/2014   HCT 19.2* 02/16/2014   MCV 94.1 02/16/2014   PLT 315 02/16/2014   Lab Results  Component Value Date   CREATININE 0.99 02/16/2014   BUN 20 02/16/2014   NA 130* 02/16/2014   K 5.7* 02/16/2014   CL 103 02/16/2014   CO2 22 02/16/2014    Lab Results  Component Value Date   HGBA1C <4.0 02/17/2012   Lipid Panel     Component Value Date/Time   CHOL 110  12/09/2011 1345   TRIG 201* 12/09/2011 1345   HDL 17* 12/09/2011 1345   CHOLHDL 6.5 12/09/2011 1345   VLDL 40 12/09/2011 1345   LDLCALC 53 12/09/2011 1345   Immunization History  Administered Date(s) Administered  . Influenza,inj,Quad PF,36+ Mos 10/18/2012, 11/14/2013  . Pneumococcal Polysaccharide-23 11/14/2013  . Tdap 04/21/2013       Assessment and plan:   Patient Active Problem List   Diagnosis Date Noted  . Prolonged Q-T interval on ECG 06/22/2012    Priority: High  . Chronic pain 03/24/2011    Priority: High  . Leukocytosis 03/24/2011    Priority: High  . Sickle cell anemia 03/23/2011    Priority: High  . Hb-SS disease with crisis 06/21/2012    Priority: Low  . SVT (supraventricular tachycardia) 02/16/2012    Priority: Low  . Anemia 02/16/2012    Priority: Low  . Gout 03/23/2011    Priority: Low  . Anxiety disorder due to medical condition 01/02/2014  . Insomnia 12/27/2013  . Hyperkalemia 11/26/2013  . Renal tubular acidosis 11/24/2013  . CAP (community acquired pneumonia) 11/23/2013  .  Acute respiratory failure with hypoxia 11/23/2013  . Need for Tdap vaccination 04/22/2013  . SOB (shortness of breath) 02/10/2013  . 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 Generalized anxiety disorder 06/21/2012  . Hypoxia 02/16/2012  . Sickle cell crisis 12/07/2011  . 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 Hb-SS disease without crisis  -  Pt has not been able to start Hydrea due to issues with obtaining it because of Insurance.   I have re-written his prescription for the Hydrea. If he is able to obtain it he has been instructed to start 500 mg daily then have labs checked in 2 weeks. If labs okay with regard to Hydrea, will increase to 500 mg daily.We discussed the need for good hydration, monitoring of hydration status, avoidance of heat, cold, stress, and infection triggers. We discussed the risks and benefits of Hydrea, including bone marrow suppression, the possibility  of GI upset, skin ulcers, hair thinning, and teratogenicity. The patient was reminded of the need to seek medical attention of any symptoms of bleeding, anemia, or infection.  - Continue folic acid 1 mg daily to prevent aplastic bone marrow crises.   - Pulmonary evaluation - Patient denies severe recurrent wheezes, shortness of breath with exercise, or persistent cough. If these symptoms develop, pulmonary function tests with spirometry will be ordered, and if abnormal, plan on referral to Pulmonology for further evaluation.  - Cardiac - Routine screening for pulmonary hypertension is not recommended.  -  Eye - High risk of proliferative retinopathy. His annual eye exam with retinal exam is scheduled for next month.  - Immunization status - Immunizations are up to date except for Meningococcal which can be given at any time and and Prevnar-13 which is due no earlier than 11/15/2014..   - Acute and chronic painful episodes - We agreed on (Opiate dose and amount of pills  per month) plan on titrating her Medication dose. We discussed that she is to receive her Schedule II prescriptions only from [place]. She is also aware that her prescription history is available to [place] online through the [facility] CSRS. Controlled substance agreement signed (Date). We reminded (Pt) that all patients receiving Schedule II narcotics must be seen for follow up every three months. We reviewed the terms of our pain agreement, including the need to keep medicines in a safe locked location away from children or pets, and the need to report excess sedation or constipation, measures to avoid constipation, and policies related to early refills and stolen prescriptions. According to the [employer] Chronic [facility] program, we have reviewed details related to analgesia, adverse effects, aberrant behaviors.  - Iron overload from chronic transfusion.  Pt has received blood but has not had excessive transfusions in the last 2 years. His last  ferritin levels were 3489 on 06/13/2102. He has been prescribed Exjade but currently is not taking the medication. I will recheck his levels to day and make a decision about prescribing Jadenu.  - Vitamin D deficiency - Drisdol 50,000 units weekly, we encouraged him to take it.  - HYDROmorphone (DILAUDID) 4 MG tablet; Take 1 tablet (4 mg total) by mouth every 4 (four) hours as needed for severe pain (pain).  Dispense: 60 tablet; Refill: 0 - hydroxyurea (HYDREA) 500 MG capsule; Take 2 capsules (1,000 mg total) by mouth daily. May take with food to minimize GI side effects.  Dispense: 60 capsule; Refill: 1 - CBC with Differential - Basic Metabolic Panel - Lactate Dehydrogenase - Urinalysis -  Ferritin The above recommendations are taken from the NIH Evidence-Based Management of Sickle Cell Disease: Expert Panel Report, 16109.   2. Medication adverse effect, subsequent encounter - In light of increased use of Methadone and risk for  Arrythmias, will check EKG. - EKG 12-Lead  3. Anemia of Chronic Disease - Pt has a baseline Hb of 7.5. Will check Hb today  4. Chronic Leukocytosis - Associated with Hb SS and increased bone marrow turnover as well as inflammation associated with Hb SS. Pt would benefit from starting Hydrea.  Tommy Goostree A., MD [facility] [place], [place] [phone]  02/27/2014, 12:44 PM

## 2014-02-17 LAB — CBC WITH DIFFERENTIAL/PLATELET
BASOS ABS: 0.2 10*3/uL — AB (ref 0.0–0.1)
BASOS PCT: 1 % (ref 0–1)
EOS PCT: 4 % (ref 0–5)
Eosinophils Absolute: 0.7 10*3/uL (ref 0.0–0.7)
HCT: 19.2 % — ABNORMAL LOW (ref 39.0–52.0)
Hemoglobin: 6.5 g/dL — CL (ref 13.0–17.0)
LYMPHS ABS: 6.6 10*3/uL — AB (ref 0.7–4.0)
Lymphocytes Relative: 38 % (ref 12–46)
MCH: 31.9 pg (ref 26.0–34.0)
MCHC: 33.9 g/dL (ref 30.0–36.0)
MCV: 94.1 fL (ref 78.0–100.0)
MONO ABS: 1.7 10*3/uL — AB (ref 0.1–1.0)
MONOS PCT: 10 % (ref 3–12)
MPV: 9.2 fL (ref 8.6–12.4)
NEUTROS ABS: 8.2 10*3/uL — AB (ref 1.7–7.7)
Neutrophils Relative %: 47 % (ref 43–77)
PLATELETS: 315 10*3/uL (ref 150–400)
RBC: 2.04 MIL/uL — ABNORMAL LOW (ref 4.22–5.81)
RDW: 26 % — ABNORMAL HIGH (ref 11.5–15.5)
WBC: 17.4 10*3/uL — ABNORMAL HIGH (ref 4.0–10.5)

## 2014-02-17 LAB — URINALYSIS
Glucose, UA: NEGATIVE mg/dL
Ketones, ur: NEGATIVE mg/dL
NITRITE: NEGATIVE
PH: 6 (ref 5.0–8.0)
PROTEIN: 100 mg/dL — AB
Specific Gravity, Urine: 1.012 (ref 1.005–1.030)
UROBILINOGEN UA: 1 mg/dL (ref 0.0–1.0)

## 2014-02-22 ENCOUNTER — Encounter: Payer: Self-pay | Admitting: Internal Medicine

## 2014-03-06 ENCOUNTER — Telehealth: Payer: Self-pay | Admitting: Internal Medicine

## 2014-03-06 DIAGNOSIS — D571 Sickle-cell disease without crisis: Secondary | ICD-10-CM

## 2014-03-06 NOTE — Telephone Encounter (Signed)
Refill request for methadone 10mg .  LOV 02/16/2014. Please advise. Thanks!

## 2014-03-07 MED ORDER — METHADONE HCL 10 MG PO TABS: 10.0000 mg | ORAL_TABLET | Freq: Every day | ORAL | Status: DC

## 2014-03-07 NOTE — Telephone Encounter (Signed)
Prescription re-written for Methadone 10 mg # 30. [place] and no inconsistencies noted.

## 2014-03-10 DIAGNOSIS — E875 Hyperkalemia: Secondary | ICD-10-CM | POA: Insufficient documentation

## 2014-03-10 MED ORDER — AMLODIPINE BESYLATE 2.5 MG PO TABS: 2.5000 mg | ORAL_TABLET | Freq: Every day | ORAL | Status: DC

## 2014-03-10 NOTE — Addendum Note (Signed)
Addended by: Marthann SchillerMATTHEWS, MICHELLE A on: 03/10/2014 11:36 AM   Modules accepted: Orders

## 2014-03-10 NOTE — Progress Notes (Addendum)
Patient ID: [name], male   DOB: April 18, 1970, 44 y.o.   MRN: [id number] Labs reviewed and I will precede as follows:  1. Proteinuria: Start on Norvasc 5 mg for reno-protection (in light of the persistent hyperkalemia will avoid ACE-I). Obtain spot urine protein/Cr ratio. 2. Secondary Hemochromatosis: Discussed initiating Jadenu starting at 14 mg/kg= 3 x(360 mg). However in light of renal function will hold until evaluated by Nephrology. 3. Hyperkalemia: Pt has had  Persistent low levl hyperkalemia. Will refer to Nephrology particularly in light of proteinuria and BUN of 20 which in a patietn with SCD could be an early indicator of renal disease. 4. Anemia of Chronic Disease: Pt was havign a recently resolving crisis and had a Hb lower than usual. Will recheck Hb/Hct and reticulocyte count before starting Hydrea.

## 2014-03-13 ENCOUNTER — Encounter (HOSPITAL_COMMUNITY): Payer: Self-pay | Admitting: *Deleted

## 2014-03-13 ENCOUNTER — Telehealth (HOSPITAL_COMMUNITY): Payer: Self-pay | Admitting: Hematology

## 2014-03-13 ENCOUNTER — Inpatient Hospital Stay (HOSPITAL_COMMUNITY)
Admission: AD | Admit: 2014-03-13 | Discharge: 2014-03-16 | DRG: 812 | Disposition: A | Discharge status: Home / Self Care | Payer: BLUE CROSS/BLUE SHIELD | Source: Ambulatory Visit | Attending: Internal Medicine | Admitting: Internal Medicine

## 2014-03-13 ENCOUNTER — Telehealth: Payer: Self-pay

## 2014-03-13 DIAGNOSIS — D638 Anemia in other chronic diseases classified elsewhere: Secondary | ICD-10-CM | POA: Diagnosis present

## 2014-03-13 DIAGNOSIS — D571 Sickle-cell disease without crisis: Secondary | ICD-10-CM | POA: Diagnosis present

## 2014-03-13 DIAGNOSIS — Z79899 Other long term (current) drug therapy: Secondary | ICD-10-CM | POA: Diagnosis not present

## 2014-03-13 DIAGNOSIS — D62 Acute posthemorrhagic anemia: Secondary | ICD-10-CM | POA: Diagnosis present

## 2014-03-13 DIAGNOSIS — D72829 Elevated white blood cell count, unspecified: Secondary | ICD-10-CM | POA: Diagnosis present

## 2014-03-13 DIAGNOSIS — E875 Hyperkalemia: Secondary | ICD-10-CM | POA: Diagnosis present

## 2014-03-13 DIAGNOSIS — D5700 Hb-SS disease with crisis, unspecified: Principal | ICD-10-CM | POA: Diagnosis present

## 2014-03-13 LAB — CBC WITH DIFFERENTIAL/PLATELET
Band Neutrophils: 0 % (ref 0–10)
Basophils Absolute: 0 10*3/uL (ref 0.0–0.1)
Basophils Relative: 0 % (ref 0–1)
Blasts: 0 %
Eosinophils Absolute: 0.2 10*3/uL (ref 0.0–0.7)
Eosinophils Relative: 1 % (ref 0–5)
HCT: 17.4 % — ABNORMAL LOW (ref 39.0–52.0)
HEMOGLOBIN: 6.1 g/dL — AB (ref 13.0–17.0)
Lymphocytes Relative: 34 % (ref 12–46)
Lymphs Abs: 6 10*3/uL — ABNORMAL HIGH (ref 0.7–4.0)
MCH: 33.7 pg (ref 26.0–34.0)
MCHC: 35.1 g/dL (ref 30.0–36.0)
MCV: 96.1 fL (ref 78.0–100.0)
METAMYELOCYTES PCT: 0 %
MONO ABS: 1.8 10*3/uL — AB (ref 0.1–1.0)
Monocytes Relative: 10 % (ref 3–12)
Myelocytes: 0 %
NEUTROS ABS: 9.7 10*3/uL — AB (ref 1.7–7.7)
Neutrophils Relative %: 55 % (ref 43–77)
Platelets: 343 10*3/uL (ref 150–400)
Promyelocytes Absolute: 0 %
RBC: 1.81 MIL/uL — ABNORMAL LOW (ref 4.22–5.81)
RDW: 33.9 % — ABNORMAL HIGH (ref 11.5–15.5)
WBC: 17.7 10*3/uL — AB (ref 4.0–10.5)
nRBC: 0 /100 WBC

## 2014-03-13 LAB — CBC
HCT: 17.9 % — ABNORMAL LOW (ref 39.0–52.0)
Hemoglobin: 6.2 g/dL — CL (ref 13.0–17.0)
MCH: 33.3 pg (ref 26.0–34.0)
MCHC: 34.6 g/dL (ref 30.0–36.0)
MCV: 96.2 fL (ref 78.0–100.0)
PLATELETS: 312 10*3/uL (ref 150–400)
RBC: 1.86 MIL/uL — ABNORMAL LOW (ref 4.22–5.81)
RDW: 33.3 % — ABNORMAL HIGH (ref 11.5–15.5)
WBC: 19.5 10*3/uL — ABNORMAL HIGH (ref 4.0–10.5)

## 2014-03-13 LAB — COMPREHENSIVE METABOLIC PANEL
ALBUMIN: 3 g/dL — AB (ref 3.5–5.2)
ALK PHOS: 141 U/L — AB (ref 39–117)
ALT: 53 U/L (ref 0–53)
ANION GAP: 3 — AB (ref 5–15)
AST: 186 U/L — ABNORMAL HIGH (ref 0–37)
BILIRUBIN TOTAL: 7.3 mg/dL — AB (ref 0.3–1.2)
BUN: 18 mg/dL (ref 6–23)
CO2: 23 mmol/L (ref 19–32)
Calcium: 8.3 mg/dL — ABNORMAL LOW (ref 8.4–10.5)
Chloride: 111 mmol/L (ref 96–112)
Creatinine, Ser: 0.67 mg/dL (ref 0.50–1.35)
GFR calc Af Amer: >90 mL/min (ref >90)
GFR calc non Af Amer: >90 mL/min (ref >90)
GLUCOSE: 96 mg/dL (ref 70–99)
Potassium: 6.2 mmol/L (ref 3.5–5.1)
Sodium: 137 mmol/L (ref 135–145)
TOTAL PROTEIN: 7.1 g/dL (ref 6.0–8.3)

## 2014-03-13 LAB — RETICULOCYTES
RBC.: 1.81 MIL/uL — ABNORMAL LOW (ref 4.22–5.81)
RBC.: 1.86 MIL/uL — ABNORMAL LOW (ref 4.22–5.81)

## 2014-03-13 LAB — CREATININE, SERUM
Creatinine, Ser: 0.84 mg/dL (ref 0.50–1.35)
GFR calc non Af Amer: >90 mL/min (ref >90)

## 2014-03-13 LAB — LACTATE DEHYDROGENASE: LDH: 1378 U/L — ABNORMAL HIGH (ref 94–250)

## 2014-03-13 MED ORDER — SODIUM BICARBONATE 650 MG PO TABS
325.0000 mg | ORAL_TABLET | Freq: Every day | ORAL | Status: DC
  Administered 2014-03-14 – 2014-03-16 (×3): 325 mg via ORAL
  Filled 2014-03-13 (×3): qty 1

## 2014-03-13 MED ORDER — AMLODIPINE BESYLATE 5 MG PO TABS
2.5000 mg | ORAL_TABLET | Freq: Every day | ORAL | Status: DC
  Administered 2014-03-14 – 2014-03-16 (×4): 2.5 mg via ORAL
  Filled 2014-03-13 (×4): qty 1

## 2014-03-13 MED ORDER — ALPRAZOLAM 0.25 MG PO TABS
0.2500 mg | ORAL_TABLET | Freq: Two times a day (BID) | ORAL | Status: DC | PRN
  Administered 2014-03-15: 0.25 mg via ORAL
  Filled 2014-03-13: qty 1

## 2014-03-13 MED ORDER — POLYETHYLENE GLYCOL 3350 17 G PO PACK
17.0000 g | PACK | Freq: Every day | ORAL | Status: DC | PRN
  Filled 2014-03-13: qty 1

## 2014-03-13 MED ORDER — METOPROLOL TARTRATE 50 MG PO TABS
50.0000 mg | ORAL_TABLET | Freq: Every day | ORAL | Status: DC
  Administered 2014-03-15 – 2014-03-16 (×2): 50 mg via ORAL
  Filled 2014-03-13 (×2): qty 1

## 2014-03-13 MED ORDER — VITAMIN D (ERGOCALCIFEROL) 1.25 MG (50000 UNIT) PO CAPS
50000.0000 [IU] | ORAL_CAPSULE | ORAL | Status: DC
  Administered 2014-03-13: 50000 [IU] via ORAL
  Filled 2014-03-13: qty 1

## 2014-03-13 MED ORDER — ZOLPIDEM TARTRATE 10 MG PO TABS: 10.0000 mg | ORAL_TABLET | Freq: Every evening | ORAL | Status: DC | PRN

## 2014-03-13 MED ORDER — DEXTROSE-NACL 5-0.45 % IV SOLN
INTRAVENOUS | Status: DC
  Administered 2014-03-14 – 2014-03-16 (×3): via INTRAVENOUS

## 2014-03-13 MED ORDER — IBUPROFEN 800 MG PO TABS: 800.0000 mg | ORAL_TABLET | Freq: Three times a day (TID) | ORAL | Status: DC | PRN

## 2014-03-13 MED ORDER — NALOXONE HCL 0.4 MG/ML IJ SOLN: 0.4000 mg | INTRAMUSCULAR | Status: DC | PRN

## 2014-03-13 MED ORDER — DIPHENHYDRAMINE HCL 12.5 MG/5ML PO ELIX: 12.5000 mg | ORAL_SOLUTION | Freq: Four times a day (QID) | ORAL | Status: DC | PRN

## 2014-03-13 MED ORDER — HYDROMORPHONE 2 MG/ML HIGH CONCENTRATION IV PCA SOLN
INTRAVENOUS | Status: DC
  Administered 2014-03-13: 11 mg via INTRAVENOUS
  Administered 2014-03-13: 14:00:00 via INTRAVENOUS
  Administered 2014-03-13: 6.5 mg via INTRAVENOUS
  Administered 2014-03-14: 4 mg via INTRAVENOUS
  Administered 2014-03-14: 4.5 mg via INTRAVENOUS
  Administered 2014-03-14: 1 mg via INTRAVENOUS
  Filled 2014-03-13: qty 25

## 2014-03-13 MED ORDER — ALLOPURINOL 300 MG PO TABS
300.0000 mg | ORAL_TABLET | Freq: Every day | ORAL | Status: DC
  Administered 2014-03-14 – 2014-03-16 (×3): 300 mg via ORAL
  Filled 2014-03-13 (×4): qty 1

## 2014-03-13 MED ORDER — FOLIC ACID 1 MG PO TABS
1.0000 mg | ORAL_TABLET | Freq: Every day | ORAL | Status: DC
Start: 2014-03-13 — End: 2014-03-13

## 2014-03-13 MED ORDER — SODIUM CHLORIDE 0.9 % IJ SOLN: 9.0000 mL | INTRAMUSCULAR | Status: DC | PRN

## 2014-03-13 MED ORDER — SODIUM CHLORIDE 0.9 % IV SOLN
12.5000 mg | Freq: Four times a day (QID) | INTRAVENOUS | Status: DC | PRN
  Filled 2014-03-13: qty 0.25

## 2014-03-13 MED ORDER — ENOXAPARIN SODIUM 40 MG/0.4ML ~~LOC~~ SOLN
40.0000 mg | SUBCUTANEOUS | Status: DC
  Administered 2014-03-13 – 2014-03-15 (×3): 40 mg via SUBCUTANEOUS
  Filled 2014-03-13 (×4): qty 0.4

## 2014-03-13 MED ORDER — SODIUM POLYSTYRENE SULFONATE 15 GM/60ML PO SUSP
30.0000 g | Freq: Two times a day (BID) | ORAL | Status: DC
Start: 2014-03-13 — End: 2014-03-16
  Administered 2014-03-13 – 2014-03-15 (×3): 30 g via ORAL
  Filled 2014-03-13 (×9): qty 120

## 2014-03-13 MED ORDER — FOLIC ACID 1 MG PO TABS
1.0000 mg | ORAL_TABLET | Freq: Every day | ORAL | Status: DC
  Administered 2014-03-14 – 2014-03-16 (×3): 1 mg via ORAL
  Filled 2014-03-13 (×3): qty 1

## 2014-03-13 MED ORDER — SENNOSIDES-DOCUSATE SODIUM 8.6-50 MG PO TABS
1.0000 | ORAL_TABLET | Freq: Two times a day (BID) | ORAL | Status: DC
  Administered 2014-03-15: 1 via ORAL
  Filled 2014-03-13 (×4): qty 1

## 2014-03-13 MED ORDER — METHADONE HCL 10 MG PO TABS
10.0000 mg | ORAL_TABLET | Freq: Every day | ORAL | Status: DC
  Administered 2014-03-14 – 2014-03-15 (×2): 10 mg via ORAL
  Filled 2014-03-13 (×2): qty 1

## 2014-03-13 MED ORDER — DEXTROSE-NACL 5-0.45 % IV SOLN
INTRAVENOUS | Status: DC
  Administered 2014-03-13 – 2014-03-14 (×3): via INTRAVENOUS

## 2014-03-13 MED ORDER — ONDANSETRON HCL 4 MG/2ML IJ SOLN: 4.0000 mg | Freq: Four times a day (QID) | INTRAMUSCULAR | Status: DC | PRN

## 2014-03-13 MED ORDER — IBUPROFEN 200 MG PO TABS
600.0000 mg | ORAL_TABLET | Freq: Four times a day (QID) | ORAL | Status: DC
  Administered 2014-03-13: 600 mg via ORAL
  Filled 2014-03-13 (×2): qty 3

## 2014-03-13 MED ORDER — HYDROXYUREA 500 MG PO CAPS
1000.0000 mg | ORAL_CAPSULE | Freq: Every day | ORAL | Status: DC
  Administered 2014-03-14: 1000 mg via ORAL
  Filled 2014-03-13: qty 2

## 2014-03-13 NOTE — Telephone Encounter (Signed)
-----   Message from Altha HarmMichelle A Matthews, MD sent at 03/10/2014 12:01 PM EST ----- Regarding: lab results [name], Please notify Mr. [name] that we need him to come in for an appointment to discuss abnormal labs. Also let him know that he needs labs before the appointment and also that [facility] because of persistent high potassium and protein in his urine. Also that I sent in a prescription for Norvasc that will help in protecting his Kidneys.

## 2014-03-13 NOTE — Progress Notes (Signed)
Dr. Mikeal HawthorneGarba [facility].

## 2014-03-13 NOTE — Progress Notes (Signed)
CRITICAL VALUE ALERT  Critical value received:  Potassium 6.2  Date of notification:  03/13/14  Time of notification:  1517  Critical value read back:Yes.    Nurse who received alert:  Thedore MinsLeslie Hadessah Grennan RN  MD notified (1st page):  Dr. Mikeal HawthorneGarba  Time of first page:  1524  MD notified (2nd page): Dr. Mikeal HawthorneGarba   Time of second page: 1549  Responding MD:  Dr. Mikeal HawthorneGarba, new orders received  Time MD responded:  1553

## 2014-03-13 NOTE — Progress Notes (Signed)
Report called to [facility] RN, Dois DavenportSandra.  Pt updated.  Ardyth GalAnderson, Breleigh Carpino Ann, RN 03/13/2014

## 2014-03-13 NOTE — H&P (Signed)
[name] is an 44 y.o. male.   Chief Complaint: Pain in legs and arms for 1 week HPI: Patient is a [age] year old gentleman with known history of sickle cell disease who has apparently been having pain moving his legs arms him back for about a week. He has been taking his medications at home including methadone but did not take his Dilaudid for the last 2 days. Pain has persisted and patient decided to come to the day hospital. He has not had any fever cough nausea or vomiting. Pain is rated as 10 out of 10 worsened with activities and movement partially relieved by his pain medicine but not adequate. It has restricted his activities on movement.  Past Medical History  Diagnosis Date  . CHF (congestive heart failure)   . Gouty arthropathy   . Chronic fatigue syndrome   . Sickle cell anemia   . Anxiety   . Depression   . Hand joint pain 10/19/2012  . Hyperkalemia 03/25/2011  . SVT (supraventricular tachycardia)   . Renal tubular acidosis     Past Surgical History  Procedure Laterality Date  . Appendectomy    . Cholecystectomy    . Splenectomy      Family History  Problem Relation Age of Onset  . Sickle cell anemia    . Sickle cell trait Mother    Social History:  reports that he has never smoked. He has never used smokeless tobacco. He reports that he does not drink alcohol or use illicit drugs.  Allergies:  Allergies  Allergen Reactions  . Toradol [Ketorolac Tromethamine]     Pt stated "it scars my kidneys"     Medications Prior to Admission  Medication Sig Dispense Refill  . allopurinol (ZYLOPRIM) 300 MG tablet Take 1 tablet (300 mg total) by mouth daily. 30 tablet 2  . ALPRAZolam (XANAX) 0.25 MG tablet Take 1 tablet (0.25 mg total) by mouth 2 (two) times daily as needed for anxiety (anxiety). For anxiety 60 tablet 3  . HYDROmorphone (DILAUDID) 4 MG tablet Take 1 tablet (4 mg total) by mouth every 4 (four) hours as needed for severe pain (pain). 60 tablet 0  . methadone  (DOLOPHINE) 10 MG tablet Take 1 tablet (10 mg total) by mouth at bedtime. 30 tablet 0  . metoprolol (LOPRESSOR) 50 MG tablet Take 1 tablet (50 mg total) by mouth daily. 60 tablet 3  . zolpidem (AMBIEN) 10 MG tablet Take 1 tablet (10 mg total) by mouth at bedtime as needed for sleep (insomnia). 30 tablet 3  . amLODipine (NORVASC) 2.5 MG tablet Take 1 tablet (2.5 mg total) by mouth daily. 90 tablet 3  . amoxicillin-clavulanate (AUGMENTIN) 875-125 MG per tablet Take 1 tablet by mouth every 12 (twelve) hours. (Patient not taking: Reported on 02/16/2014) 12 tablet 0  . ergocalciferol (DRISDOL) 50000 UNITS capsule Take 1 capsule (50,000 Units total) by mouth once a week. 4 capsule 12  . folic acid (FOLVITE) 1 MG tablet Take 1 tablet (1 mg total) by mouth daily. 30 tablet 11  . hydroxyurea (HYDREA) 500 MG capsule Take 2 capsules (1,000 mg total) by mouth daily. May take with food to minimize GI side effects. 60 capsule 1  . ibuprofen (ADVIL,MOTRIN) 800 MG tablet Take 1 tablet (800 mg total) by mouth every 8 (eight) hours as needed for fever or moderate pain (pain & fever). 30 tablet 2  . sodium bicarbonate 325 MG tablet Take 1 tablet (325 mg total) by mouth daily. 90 tablet  3  . sodium polystyrene (KAYEXALATE) 15 GM/60ML suspension Take 120 mLs (30 g total) by mouth once. (Patient not taking: Reported on 02/16/2014) 120 mL 1    No results found for this or any previous visit (from the past 48 hour(s)). No results found.  Review of Systems  Constitutional: Negative.   HENT: Negative.   Eyes: Negative.   Respiratory: Negative.   Cardiovascular: Negative.   Gastrointestinal: Negative.   Genitourinary: Negative.   Musculoskeletal: Negative.   Skin: Negative.   Neurological: Negative.   Psychiatric/Behavioral: Negative.     Blood pressure 132/63, pulse 87, temperature 98.8 F (37.1 C), temperature source Oral, resp. rate 18, SpO2 90 %. Physical Exam  Constitutional: He is oriented to person,  place, and time. He appears well-developed and well-nourished.  HENT:  Head: Normocephalic and atraumatic.  Right Ear: External ear normal.  Left Ear: External ear normal.  Eyes: Conjunctivae and EOM are normal. Pupils are equal, round, and reactive to light.  Neck: Normal range of motion. Neck supple.  Cardiovascular: Normal rate, regular rhythm, normal heart sounds and intact distal pulses.   Respiratory: Effort normal and breath sounds normal.  GI: Soft. Bowel sounds are normal.  Musculoskeletal: Normal range of motion. He exhibits tenderness. He exhibits no edema.  Neurological: He is alert and oriented to person, place, and time.  Skin: Skin is warm and dry.     Assessment/Plan A 44 yo man admitted with sickle cell painful crisis.  #1 Sickle Cell Painful Crisis: Patient will be admitted to the day hospital. We'll start him on Dilaudid PCA, Toradol as well as IV fluids. Once his pain is controlled we'll discharge him home. Next  #2 sickle cell anemia: We will check patient's hemoglobin and markers of hemolysis to rule out hemolytic anemia. Next  #3 dehydration: we will hydrate patient aggressively  [name],LAWAL 03/13/2014, 1:18 PM

## 2014-03-13 NOTE — Telephone Encounter (Signed)
Patient C/O pain to hips/back that is 5/10.  Patient states pain started over the weekend and hasn't improved with home medications.  Patient denies N/V/D, chest pain, shortness of breath or abdominal pain.  I explained I would notify the physician and give him a call back.  Patient verbalizes understanding.

## 2014-03-13 NOTE — H&P (Signed)
[name] is an 44 y.o. male.   Chief Complaint: Pain in legs and arms for 1 week HPI: Patient is a 44 year old gentleman with known history of sickle cell disease who has apparently been having pain moving his legs arms him back for about a week. He has been taking his medications at home including methadone but did not take his Dilaudid for the last 2 days. Pain has persisted and patient decided to come to the day hospital. He has not had any fever cough nausea or vomiting. Pain is rated as 10 out of 10 worsened with activities and movement partially relieved by his pain medicine but not adequate. It has restricted his activities on movement. Patient was treated in the day hospital but was found to have significant hyperkalemia at 6.2. He has been on Kayexalate at home but has not taken adequate. His hemoglobin was also 6.1 his baseline is 6.5-7.2 which is therefore not performed from his baseline. Due to continued pain as well as hypokalemia patient is being admitted to the hospital for inpatient care.  Past Medical History  Diagnosis Date  . CHF (congestive heart failure)   . Gouty arthropathy   . Chronic fatigue syndrome   . Sickle cell anemia   . Anxiety   . Depression   . Hand joint pain 10/19/2012  . Hyperkalemia 03/25/2011  . SVT (supraventricular tachycardia)   . Renal tubular acidosis     Past Surgical History  Procedure Laterality Date  . Appendectomy    . Cholecystectomy    . Splenectomy      Family History  Problem Relation Age of Onset  . Sickle cell anemia    . Sickle cell trait Mother    Social History:  reports that he has never smoked. He has never used smokeless tobacco. He reports that he does not drink alcohol or use illicit drugs.  Allergies:  Allergies  Allergen Reactions  . Toradol [Ketorolac Tromethamine]     Pt stated "it scars my kidneys"     Medications Prior to Admission  Medication Sig Dispense Refill  . allopurinol (ZYLOPRIM) 300 MG tablet  Take 1 tablet (300 mg total) by mouth daily. 30 tablet 2  . ALPRAZolam (XANAX) 0.25 MG tablet Take 1 tablet (0.25 mg total) by mouth 2 (two) times daily as needed for anxiety (anxiety). For anxiety 60 tablet 3  . HYDROmorphone (DILAUDID) 4 MG tablet Take 1 tablet (4 mg total) by mouth every 4 (four) hours as needed for severe pain (pain). 60 tablet 0  . methadone (DOLOPHINE) 10 MG tablet Take 1 tablet (10 mg total) by mouth at bedtime. 30 tablet 0  . metoprolol (LOPRESSOR) 50 MG tablet Take 1 tablet (50 mg total) by mouth daily. 60 tablet 3  . zolpidem (AMBIEN) 10 MG tablet Take 1 tablet (10 mg total) by mouth at bedtime as needed for sleep (insomnia). 30 tablet 3  . amLODipine (NORVASC) 2.5 MG tablet Take 1 tablet (2.5 mg total) by mouth daily. 90 tablet 3  . amoxicillin-clavulanate (AUGMENTIN) 875-125 MG per tablet Take 1 tablet by mouth every 12 (twelve) hours. (Patient not taking: Reported on 02/16/2014) 12 tablet 0  . ergocalciferol (DRISDOL) 50000 UNITS capsule Take 1 capsule (50,000 Units total) by mouth once a week. 4 capsule 12  . folic acid (FOLVITE) 1 MG tablet Take 1 tablet (1 mg total) by mouth daily. 30 tablet 11  . hydroxyurea (HYDREA) 500 MG capsule Take 2 capsules (1,000 mg total) by mouth  daily. May take with food to minimize GI side effects. 60 capsule 1  . ibuprofen (ADVIL,MOTRIN) 800 MG tablet Take 1 tablet (800 mg total) by mouth every 8 (eight) hours as needed for fever or moderate pain (pain & fever). 30 tablet 2  . sodium bicarbonate 325 MG tablet Take 1 tablet (325 mg total) by mouth daily. 90 tablet 3  . sodium polystyrene (KAYEXALATE) 15 GM/60ML suspension Take 120 mLs (30 g total) by mouth once. (Patient not taking: Reported on 02/16/2014) 120 mL 1    Results for orders placed or performed during the hospital encounter of 03/13/14 (from the past 48 hour(s))  Lactate dehydrogenase     Status: Abnormal   Collection Time: 03/13/14  1:15 PM  Result Value Ref Range   LDH  1378 (H) 94 - 250 U/L  Comprehensive metabolic panel     Status: Abnormal   Collection Time: 03/13/14  1:15 PM  Result Value Ref Range   Sodium 137 135 - 145 mmol/L   Potassium 6.2 (HH) 3.5 - 5.1 mmol/L    Comment: ICTERIC SPECIMEN REPEATED TO VERIFY CRITICAL RESULT CALLED TO, READ BACK BY AND VERIFIED WITH: ANDERSON,L. RN. AT 1518 03/13/14 MW    Chloride 111 96 - 112 mmol/L   CO2 23 19 - 32 mmol/L   Glucose, Bld 96 70 - 99 mg/dL   BUN 18 6 - 23 mg/dL   Creatinine, Ser 4.64 0.50 - 1.35 mg/dL   Calcium 8.3 (L) 8.4 - 10.5 mg/dL   Total Protein 7.1 6.0 - 8.3 g/dL   Albumin 3.0 (L) 3.5 - 5.2 g/dL   AST 869 (H) 0 - 37 U/L   ALT 53 0 - 53 U/L   Alkaline Phosphatase 141 (H) 39 - 117 U/L   Total Bilirubin 7.3 (H) 0.3 - 1.2 mg/dL   GFR calc non Af Amer >90 >90 mL/min   GFR calc Af Amer >90 >90 mL/min    Comment: (NOTE) The eGFR has been calculated using the CKD EPI equation. This calculation has not been validated in all clinical situations. eGFR's persistently <90 mL/min signify possible Chronic Kidney Disease.    Anion gap 3 (L) 5 - 15  CBC WITH DIFFERENTIAL     Status: Abnormal   Collection Time: 03/13/14  1:15 PM  Result Value Ref Range   WBC 17.7 (H) 4.0 - 10.5 K/uL    Comment: WHITE COUNT CONFIRMED ON SMEAR ADJUSTED FOR NUCLEATED RBC'S    RBC 1.81 (L) 4.22 - 5.81 MIL/uL   Hemoglobin 6.1 (LL) 13.0 - 17.0 g/dL    Comment: SPECIMEN CHECKED FOR CLOTS REPEATED TO VERIFY CRITICAL RESULT CALLED TO, READ BACK BY AND VERIFIED WITH: TATUM.T RN AT 1424 ON 2.8.16 BY BUCHHOLZ.W.    HCT 17.4 (L) 39.0 - 52.0 %   MCV 96.1 78.0 - 100.0 fL   MCH 33.7 26.0 - 34.0 pg   MCHC 35.1 30.0 - 36.0 g/dL   RDW 61.3 (H) 48.2 - 77.9 %   Platelets 343 150 - 400 K/uL    Comment: SPECIMEN CHECKED FOR CLOTS REPEATED TO VERIFY PLATELET COUNT CONFIRMED BY SMEAR    Neutrophils Relative % 55 43 - 77 %   Lymphocytes Relative 34 12 - 46 %   Monocytes Relative 10 3 - 12 %   Eosinophils Relative 1 0 - 5  %   Basophils Relative 0 0 - 1 %   Band Neutrophils 0 0 - 10 %   Metamyelocytes Relative 0 %  Myelocytes 0 %   Promyelocytes Absolute 0 %   Blasts 0 %   nRBC 0 0 /100 WBC   Neutro Abs 9.7 (H) 1.7 - 7.7 K/uL   Lymphs Abs 6.0 (H) 0.7 - 4.0 K/uL   Monocytes Absolute 1.8 (H) 0.1 - 1.0 K/uL   Eosinophils Absolute 0.2 0.0 - 0.7 K/uL   Basophils Absolute 0.0 0.0 - 0.1 K/uL   RBC Morphology SICKLE CELLS     Comment: MARKED POLYCHROMASIA HOWELL/JOLLY BODIES   Reticulocytes     Status: Abnormal   Collection Time: 03/13/14  1:15 PM  Result Value Ref Range   Retic Ct Pct >23.0 (H) 0.4 - 3.1 %    Comment: REPEATED TO VERIFY   RBC. 1.81 (L) 4.22 - 5.81 MIL/uL   Retic Count, Manual NOT CALCULATED 19.0 - 186.0 K/uL   No results found.  Review of Systems  Constitutional: Negative.   Eyes: Negative.   Respiratory: Negative.   Cardiovascular: Negative.   Gastrointestinal: Negative.   Genitourinary: Negative.   Musculoskeletal: Positive for myalgias and neck pain. Negative for back pain and falls.  Skin: Negative.   Neurological: Negative.     Blood pressure 115/53, pulse 82, temperature 98.6 F (37 C), temperature source Oral, resp. rate 17, SpO2 94 %. Physical Exam  Constitutional: He is oriented to person, place, and time. He appears well-developed and well-nourished.  HENT:  Head: Normocephalic and atraumatic.  Right Ear: External ear normal.  Left Ear: External ear normal.  Eyes: Conjunctivae and EOM are normal. Pupils are equal, round, and reactive to light.  Neck: Normal range of motion. Neck supple.  Cardiovascular: Normal rate, regular rhythm, normal heart sounds and intact distal pulses.   Respiratory: Effort normal and breath sounds normal.  GI: Soft. Bowel sounds are normal.  Musculoskeletal: Normal range of motion. He exhibits tenderness. He exhibits no edema.  Neurological: He is alert and oriented to person, place, and time.  Skin: Skin is warm and dry.      Assessment/Plan A 44 yo man admitted with sickle cell painful crisis.  #1 Sickle Cell Painful Crisis: Patient will be continued on Dilaudid PCA, Motrin, and IV fluids. He is allergic to Toradol so we will avoid it. We will continue supportive care and to his ready for discharge.  #2 sickle cell anemia: Hemoglobin is currently a 6.1. His baseline is 6.5-7.2. We will monitor his hemoglobin closely. No transfusion is indicated at this point however there is sign that patient is getting hemolytic anemia with his LDH been 1370 well above his baseline.  #3 hyperkalemia: Patient has chronic hyperkalemia and has been on Kayexalate. He has not taken it today. His potassium was checked twice by the lab and confirmed. I will start him on Kayexalate in the hospital. Most likely the high potassium is worsened by hemolysis. Monitor him on telemetry bed at the first 24 hours.  #4 dehydration: we will continue to hydrate patient.  #5 hypertension: We'll continue home medications. Frederick Klinger,LAWAL 03/13/2014, 4:51 PM

## 2014-03-13 NOTE — Telephone Encounter (Signed)
Spoke with Dr. Garba, and it is ok for patient to come to [facility].  Patient verbalizes understanding. 

## 2014-03-13 NOTE — Telephone Encounter (Signed)
Patient called and advised to come in for lab work before next scheduled appointment which is 03/16/2014 @3 :30pm. Advised patient to start Norvasc as prescribed to protect kidneys and advised patient we are referring to [facility]. Patient verbalizes understanding of all instructions.   Faxed records to [facility]. 03/13/2014 @9 :45am. Thanks!

## 2014-03-13 NOTE — Discharge Summary (Signed)
Physician Discharge Summary  Patient ID: [name] MRN: [id number] DOB/AGE: [id number] 43 y.o.  Admit date: 03/13/2014 Discharge date: 03/13/2014  Admission Diagnoses:  Discharge Diagnoses:  Active Problems:   Sickle cell anemia   Sickle cell anemia with crisis   Discharged Condition: good  Hospital Course: A 44 year old gentleman admitted to the day hospital with sickle cell painful crisis. Patient has been on Dilaudid PCA. His blood work showed evidence of hemolysis with hyperkalemia potassium 6.2. He still having pain at 10 out of 10. It goes down to 5 out of 10. Due to patient's hyperkalemia and dropping his hemoglobin we will admit him to the inpatient service and discharge him from the day hospital for continued care.  Consults: None  Significant Diagnostic Studies: labs: CBCs and CMP is checked. His hemoglobin is 6.1 and potassium 6.2  Treatments: IV hydration and analgesia: Dilaudid  Discharge Exam: Blood pressure 115/53, pulse 82, temperature 98.6 F (37 C), temperature source Oral, resp. rate 17, SpO2 94 %. General appearance: alert, cooperative and no distress Eyes: conjunctivae/corneas clear. PERRL, EOM's intact. Fundi benign. Neck: no adenopathy, no carotid bruit, no JVD, supple, symmetrical, trachea midline and thyroid not enlarged, symmetric, no tenderness/mass/nodules Back: symmetric, no curvature. ROM normal. No CVA tenderness. Resp: clear to auscultation bilaterally Chest wall: no tenderness Cardio: regular rate and rhythm, S1, S2 normal, no murmur, click, rub or gallop and normal apical impulse GI: soft, non-tender; bowel sounds normal; no masses,  no organomegaly Extremities: extremities normal, atraumatic, no cyanosis or edema Pulses: 2+ and symmetric Skin: Skin color, texture, turgor normal. No rashes or lesions Neurologic: Grossly normal  Disposition: 01-Home or Self Care     Medication List    ASK your doctor about these medications        allopurinol 300 MG tablet  Commonly known as:  ZYLOPRIM  Take 1 tablet (300 mg total) by mouth daily.     ALPRAZolam 0.25 MG tablet  Commonly known as:  XANAX  Take 1 tablet (0.25 mg total) by mouth 2 (two) times daily as needed for anxiety (anxiety). For anxiety     amLODipine 2.5 MG tablet  Commonly known as:  NORVASC  Take 1 tablet (2.5 mg total) by mouth daily.     amoxicillin-clavulanate 875-125 MG per tablet  Commonly known as:  AUGMENTIN  Take 1 tablet by mouth every 12 (twelve) hours.     ergocalciferol 50000 UNITS capsule  Commonly known as:  DRISDOL  Take 1 capsule (50,000 Units total) by mouth once a week.     folic acid 1 MG tablet  Commonly known as:  FOLVITE  Take 1 tablet (1 mg total) by mouth daily.     HYDROmorphone 4 MG tablet  Commonly known as:  DILAUDID  Take 1 tablet (4 mg total) by mouth every 4 (four) hours as needed for severe pain (pain).     hydroxyurea 500 MG capsule  Commonly known as:  HYDREA  Take 2 capsules (1,000 mg total) by mouth daily. May take with food to minimize GI side effects.     ibuprofen 800 MG tablet  Commonly known as:  ADVIL,MOTRIN  Take 1 tablet (800 mg total) by mouth every 8 (eight) hours as needed for fever or moderate pain (pain & fever).     methadone 10 MG tablet  Commonly known as:  DOLOPHINE  Take 1 tablet (10 mg total) by mouth at bedtime.     metoprolol 50 MG tablet  Commonly known as:  LOPRESSOR  Take 1 tablet (50 mg total) by mouth daily.     sodium bicarbonate 325 MG tablet  Take 1 tablet (325 mg total) by mouth daily.     sodium polystyrene 15 GM/60ML suspension  Commonly known as:  KAYEXALATE  Take 120 mLs (30 g total) by mouth once.     zolpidem 10 MG tablet  Commonly known as:  AMBIEN  Take 1 tablet (10 mg total) by mouth at bedtime as needed for sleep (insomnia).         SignedLonia Blood: [name],LAWAL 03/13/2014, 4:52 PM

## 2014-03-13 NOTE — Progress Notes (Addendum)
CRITICAL VALUE ALERT  Critical value received:  Hgb 6.1  Date of notification:  03/13/2014  Time of notification:  1423  Critical value read back: yes  Nurse who received alert:  Lanae Boastiffany Tatum, RN  MD notified (1st page):  Dr. Mikeal HawthorneGarba  Time of first page: 1424  MD responded at 1437 and verbally notified of results.

## 2014-03-14 DIAGNOSIS — D72829 Elevated white blood cell count, unspecified: Secondary | ICD-10-CM

## 2014-03-14 DIAGNOSIS — E875 Hyperkalemia: Secondary | ICD-10-CM

## 2014-03-14 LAB — COMPREHENSIVE METABOLIC PANEL
ALT: 45 U/L (ref 0–53)
AST: 120 U/L — ABNORMAL HIGH (ref 0–37)
Albumin: 2.8 g/dL — ABNORMAL LOW (ref 3.5–5.2)
Alkaline Phosphatase: 130 U/L — ABNORMAL HIGH (ref 39–117)
Anion gap: 5 (ref 5–15)
BUN: 17 mg/dL (ref 6–23)
CO2: 24 mmol/L (ref 19–32)
Calcium: 8 mg/dL — ABNORMAL LOW (ref 8.4–10.5)
Chloride: 112 mmol/L (ref 96–112)
Creatinine, Ser: 0.97 mg/dL (ref 0.50–1.35)
GLUCOSE: 103 mg/dL — AB (ref 70–99)
Potassium: 4.8 mmol/L (ref 3.5–5.1)
Sodium: 141 mmol/L (ref 135–145)
Total Bilirubin: 6.4 mg/dL — ABNORMAL HIGH (ref 0.3–1.2)
Total Protein: 6.8 g/dL (ref 6.0–8.3)

## 2014-03-14 LAB — CBC WITH DIFFERENTIAL/PLATELET
BASOS ABS: 0.2 10*3/uL — AB (ref 0.0–0.1)
BASOS PCT: 1 % (ref 0–1)
Eosinophils Absolute: 0.8 10*3/uL — ABNORMAL HIGH (ref 0.0–0.7)
Eosinophils Relative: 5 % (ref 0–5)
HEMATOCRIT: 17.7 % — AB (ref 39.0–52.0)
HEMOGLOBIN: 6.2 g/dL — AB (ref 13.0–17.0)
LYMPHS ABS: 6.8 10*3/uL — AB (ref 0.7–4.0)
Lymphocytes Relative: 40 % (ref 12–46)
MCH: 33.9 pg (ref 26.0–34.0)
MCHC: 35 g/dL (ref 30.0–36.0)
MCV: 96.7 fL (ref 78.0–100.0)
MONO ABS: 1.5 10*3/uL — AB (ref 0.1–1.0)
Monocytes Relative: 9 % (ref 3–12)
NEUTROS PCT: 45 % (ref 43–77)
Neutro Abs: 7.6 10*3/uL (ref 1.7–7.7)
Platelets: 277 10*3/uL (ref 150–400)
RBC: 1.83 MIL/uL — ABNORMAL LOW (ref 4.22–5.81)
RDW: 33.1 % — ABNORMAL HIGH (ref 11.5–15.5)
WBC: 16.9 10*3/uL — ABNORMAL HIGH (ref 4.0–10.5)
nRBC: 35 /100 WBC — ABNORMAL HIGH

## 2014-03-14 LAB — LACTATE DEHYDROGENASE: LDH: 911 U/L — AB (ref 94–250)

## 2014-03-14 MED ORDER — DIPHENHYDRAMINE HCL 12.5 MG/5ML PO ELIX: 25.0000 mg | ORAL_SOLUTION | Freq: Four times a day (QID) | ORAL | Status: DC | PRN

## 2014-03-14 MED ORDER — HYDROMORPHONE HCL 4 MG PO TABS
4.0000 mg | ORAL_TABLET | ORAL | Status: DC
  Administered 2014-03-14 – 2014-03-16 (×11): 4 mg via ORAL
  Filled 2014-03-14 (×12): qty 1

## 2014-03-14 MED ORDER — SODIUM CHLORIDE 0.9 % IV SOLN
25.0000 mg | Freq: Four times a day (QID) | INTRAVENOUS | Status: DC | PRN
  Filled 2014-03-14: qty 0.5

## 2014-03-14 NOTE — Progress Notes (Signed)
SICKLE CELL SERVICE PROGRESS NOTE  [name] ZOX:[id number] DOB: 07/03/1970 DOA: 03/13/2014 PCP: MATTHEWS,MICHELLE A., MD  Assessment/Plan: Active Problems:   Sickle cell anemia   Leukocytosis   Hyperkalemia   Sickle cell anemia with crisis   Sickle cell disease with crisis  1. Hb SS with crisis: Pt reports feeling better. Pain is at 5/10. He has had significant hemolysis during this crisis likely accounting for the hyperkalemia. I wil schedule his oral Dilaudid and leave PCA fotr PRN use. He ahs used 22 mg with 51/44: Demands/Deliveries. I anticipate discharge tomorrow if no further hemolysis and drop in Hb. 2. Hyperkalemia: Pt has had continued hyperkalemia. However potassium level is improved today. Will continue to monitor. [facility] [place]  3. Anemia: Pt has an acute blood loss anemia on his already anemia of chronic disease. I will reassess Hb tomorrow and make decision about transfusion. He may require Aranesp therapy. 4. Chronic pain: Continue Methadone.  5. Sickle cell Disease: He has been prescribed Hydrea but has not started it as an outpatient. If Hb drops will discontinue and re-assess as an out patient. 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 However, pt has declined therapy.  Code Status: Full Code Family Communication: N/A Disposition Plan: Not yet ready for discharge  MATTHEWS,MICHELLE A.  Pager [phone]. If 7PM-7AM, please contact night-coverage.  03/14/2014, 5:10 PM  LOS: 1 day    Consultants:  None  Procedures:  None  Antibiotics:  None  HPI/Subjective: Pt reports pain as 5/10 and localized to legs and back. Last BM last night.  Objective: Filed Vitals:   03/14/14 1200 03/14/14 1345 03/14/14 1522 03/14/14 1600  BP:  127/60    Pulse:  94    Temp:  98.5 F (36.9 C)    TempSrc:  Oral    Resp:  17 16 16 17   Height:      Weight:      SpO2: 94% 93% 94% 95%   Weight change:   Intake/Output Summary (Last 24 hours) at 03/14/14 1710 Last data filed at 03/14/14 1346  Gross per 24 hour  Intake 2896.25 ml  Output   1125 ml  Net 1771.25 ml    General: Alert, awake, oriented x3, in no acute distress.  HEENT: [place]/AT PEERL, EOMI, icteric Neck: Trachea midline,  no masses, no thyromegal,y no JVD, no carotid bruit OROPHARYNX:  Moist, No exudate/ erythema/lesions.  Heart: Regular rate and rhythm, without murmurs, rubs, gallops.  Lungs: Clear to auscultation, no wheezing or rhonchi noted.  Abdomen: Soft, nontender, nondistended, positive bowel sounds, no masses no hepatosplenomegaly noted.  Neuro: No focal neurological deficits noted cranial nerves II through XII grossly intact.  Strength baseline in bilateral upper and lower extremities. Musculoskeletal: No warm swelling or erythema around joints, no spinal tenderness noted. Psychiatric: Patient alert and oriented x3, good insight and cognition, good recent to remote recall. Lymph node survey: No cervical axillary or inguinal lymphadenopathy noted.   Data Reviewed: Basic Metabolic Panel:  Recent Labs Lab 03/13/14 1315 03/13/14 1811 03/14/14 0550  NA 137  --  141  K 6.2*  --  4.8  CL 111  --  112  CO2 23  --  24  GLUCOSE 96  --  103*  BUN 18  --  17  CREATININE 0.67 0.84 0.97  CALCIUM 8.3*  --  8.0*   Liver  Function Tests:  Recent Labs Lab 03/13/14 1315 03/14/14 0550  AST 186* 120*  ALT 53 45  ALKPHOS 141* 130*  BILITOT 7.3* 6.4*  PROT 7.1 6.8  ALBUMIN 3.0* 2.8*   No results for input(s): LIPASE, AMYLASE in the last 168 hours. No results for input(s): AMMONIA in the last 168 hours. CBC:  Recent Labs Lab 03/13/14 1315 03/13/14 1811 03/14/14 0550  WBC 17.7* 19.5* 16.9*  NEUTROABS 9.7*  --  7.6  HGB 6.1* 6.2* 6.2*  HCT 17.4* 17.9* 17.7*  MCV 96.1 96.2 96.7  PLT 343 312 277   Cardiac Enzymes: No results  for input(s): CKTOTAL, CKMB, CKMBINDEX, TROPONINI in the last 168 hours. BNP (last 3 results) No results for input(s): BNP in the last 8760 hours.  ProBNP (last 3 results)  Recent Labs  07/06/13 1755 11/23/13 1937  PROBNP 482.9* 892.0*    CBG: No results for input(s): GLUCAP in the last 168 hours.  No results found for this or any previous visit (from the past 240 hour(s)).   Studies: No results found.  Scheduled Meds: . allopurinol  300 mg Oral Daily  . amLODipine  2.5 mg Oral Daily  . enoxaparin (LOVENOX) injection  40 mg Subcutaneous Q24H  . folic acid  1 mg Oral Daily  . HYDROmorphone PCA 2 mg/mL   Intravenous 6 times per day  . hydroxyurea  1,000 mg Oral Daily  . methadone  10 mg Oral QHS  . metoprolol  50 mg Oral Daily  . senna-docusate  1 tablet Oral BID  . sodium bicarbonate  325 mg Oral Daily  . sodium polystyrene  30 g Oral BID  . Vitamin D (Ergocalciferol)  50,000 Units Oral Weekly   Continuous Infusions: . dextrose 5 % and 0.45% NaCl      Time spent 40 minutes.

## 2014-03-15 DIAGNOSIS — D599 Acquired hemolytic anemia, unspecified: Secondary | ICD-10-CM

## 2014-03-15 LAB — BASIC METABOLIC PANEL
ANION GAP: 5 (ref 5–15)
BUN: 16 mg/dL (ref 6–23)
CALCIUM: 8 mg/dL — AB (ref 8.4–10.5)
CO2: 24 mmol/L (ref 19–32)
CREATININE: 0.79 mg/dL (ref 0.50–1.35)
Chloride: 113 mmol/L — ABNORMAL HIGH (ref 96–112)
Glucose, Bld: 96 mg/dL (ref 70–99)
Potassium: 5.4 mmol/L — ABNORMAL HIGH (ref 3.5–5.1)
Sodium: 142 mmol/L (ref 135–145)

## 2014-03-15 LAB — CBC WITH DIFFERENTIAL/PLATELET
BASOS PCT: 0 % (ref 0–1)
Band Neutrophils: 0 % (ref 0–10)
Basophils Absolute: 0 10*3/uL (ref 0.0–0.1)
Blasts: 0 %
EOS ABS: 1.1 10*3/uL — AB (ref 0.0–0.7)
EOS PCT: 7 % — AB (ref 0–5)
HEMATOCRIT: 17.3 % — AB (ref 39.0–52.0)
HEMOGLOBIN: 5.9 g/dL — AB (ref 13.0–17.0)
LYMPHS ABS: 4.9 10*3/uL — AB (ref 0.7–4.0)
Lymphocytes Relative: 32 % (ref 12–46)
MCH: 34.1 pg — AB (ref 26.0–34.0)
MCHC: 34.1 g/dL (ref 30.0–36.0)
MCV: 100 fL (ref 78.0–100.0)
MONO ABS: 0.5 10*3/uL (ref 0.1–1.0)
MONOS PCT: 3 % (ref 3–12)
Metamyelocytes Relative: 0 %
Myelocytes: 0 %
NEUTROS ABS: 8.8 10*3/uL — AB (ref 1.7–7.7)
Neutrophils Relative %: 58 % (ref 43–77)
Platelets: 204 10*3/uL (ref 150–400)
Promyelocytes Absolute: 0 %
RBC: 1.73 MIL/uL — ABNORMAL LOW (ref 4.22–5.81)
WBC: 15.3 10*3/uL — ABNORMAL HIGH (ref 4.0–10.5)
nRBC: 28 /100 WBC — ABNORMAL HIGH

## 2014-03-15 LAB — LACTATE DEHYDROGENASE: LDH: 756 U/L — AB (ref 94–250)

## 2014-03-15 MED ORDER — IBUPROFEN 800 MG PO TABS
800.0000 mg | ORAL_TABLET | Freq: Three times a day (TID) | ORAL | Status: DC | PRN
  Administered 2014-03-15: 800 mg via ORAL
  Filled 2014-03-15: qty 1

## 2014-03-15 MED ORDER — HYDROMORPHONE HCL 2 MG/ML IJ SOLN
INTRAMUSCULAR | Status: AC
  Filled 2014-03-15: qty 1

## 2014-03-15 MED ORDER — SODIUM CHLORIDE 0.9 % IV SOLN: Freq: Once | INTRAVENOUS | Status: DC

## 2014-03-15 MED ORDER — FUROSEMIDE 10 MG/ML IJ SOLN
20.0000 mg | Freq: Once | INTRAMUSCULAR | Status: AC
  Administered 2014-03-15: 20 mg via INTRAVENOUS
  Filled 2014-03-15: qty 2

## 2014-03-15 MED ORDER — HYDROMORPHONE HCL 2 MG/ML IJ SOLN
2.0000 mg | INTRAMUSCULAR | Status: DC | PRN
Start: 2014-03-15 — End: 2014-03-16
  Administered 2014-03-15 – 2014-03-16 (×4): 2 mg via INTRAVENOUS
  Filled 2014-03-15 (×3): qty 1

## 2014-03-15 NOTE — Progress Notes (Signed)
Patient requesting to have PCA discontinued. Patient states that pain is controlled with PO Dilaudid. Pain is 2/10, per patient this is his pain level at home. NP on call paged. PCA Dilaudid discontinued at this time. Will continue to monitor closely.

## 2014-03-15 NOTE — Progress Notes (Signed)
SICKLE CELL SERVICE PROGRESS NOTE  [name] RUE:[id number] DOB: [birth year] DOA: 03/13/2014 PCP: Ellerie Arenz A., MD  Assessment/Plan: Active Problems:   Sickle cell anemia   Leukocytosis   Hyperkalemia   Sickle cell anemia with crisis   Sickle cell disease with crisis  1. Hb SS with crisis: Pt reports feeling better. Pain is at 5/10. He has had significant hemolysis during this crisis likely accounting for the hyperkalemia. I wil schedule his oral Dilaudid and leave PCA fotr PRN use. He ahs used 22 mg with 51/44: Demands/Deliveries. I anticipate discharge tomorrow if no further hemolysis and drop in Hb. 2. Hyperkalemia: Pt has had continued hyperkalemia. Potassium again elevated today. Will continue Kayexalate. [facility] [place]  3. Anemia: Pt has an acute blood loss anemia on his already anemia of chronic disease. I will  Transfuse 1 unit of PRBC's today. He may require Aranesp therapy. 4. Chronic pain: Continue Methadone.  5. Sickle cell Disease: He has been prescribed Hydrea but has not started it as an outpatient. I will discontinue.  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 However, pt has declined therapy.  Code Status: Full Code Family Communication: N/A Disposition Plan: Not yet ready for discharge  Adrionna Delcid A.  Pager [phone]. If 7PM-7AM, please contact night-coverage.  03/15/2014, 5:55 PM  LOS: 2 days    Consultants:  None  Procedures:  None  Antibiotics:  None  HPI/Subjective: Pt reports pain as 5/10 and localized to legs and back. Last BM last night.  Objective: Filed Vitals:   03/15/14 0617 03/15/14 1151 03/15/14 1229 03/15/14 1535  BP: 124/69 129/59 137/68 124/65  Pulse: 80 82 85 73  Temp: 98.1 F (36.7 C) 98.6 F (37 C) 99 F (37.2 C) 98.7 F (37.1 C)  TempSrc: Oral Oral Oral  Oral  Resp: 18 18 18 18   Height:      Weight:      SpO2: 96% 98% 94% 95%   Weight change:   Intake/Output Summary (Last 24 hours) at 03/15/14 1755 Last data filed at 03/15/14 1535  Gross per 24 hour  Intake 2416.67 ml  Output    601 ml  Net 1815.67 ml    General: Alert, awake, oriented x3, in no acute distress.  HEENT: [place]/AT PEERL, EOMI, icteric Neck: Trachea midline,  no masses, no thyromegal,y no JVD, no carotid bruit OROPHARYNX:  Moist, No exudate/ erythema/lesions.  Heart: Regular rate and rhythm, without murmurs, rubs, gallops.  Lungs: Clear to auscultation, no wheezing or rhonchi noted.  Abdomen: Soft, nontender, nondistended, positive bowel sounds, no masses no hepatosplenomegaly noted.  Neuro: No focal neurological deficits noted cranial nerves II through XII grossly intact.  Strength baseline in bilateral upper and lower extremities. Musculoskeletal: No warm swelling or erythema around joints, no spinal tenderness noted. Psychiatric: Patient alert and oriented x3, good insight and cognition, good recent to remote recall. Lymph node survey: No cervical axillary or inguinal lymphadenopathy noted.   Data Reviewed: Basic Metabolic Panel:  Recent Labs Lab 03/13/14 1315 03/13/14 1811 03/14/14 0550 03/15/14 0535  NA 137  --  141 142  K 6.2*  --  4.8 5.4*  CL 111  --  112 113*  CO2 23  --  24 24  GLUCOSE 96  --  103* 96  BUN 18  --  17 16  CREATININE 0.67 0.84 0.97 0.79  CALCIUM  8.3*  --  8.0* 8.0*   Liver Function Tests:  Recent Labs Lab 03/13/14 1315 03/14/14 0550  AST 186* 120*  ALT 53 45  ALKPHOS 141* 130*  BILITOT 7.3* 6.4*  PROT 7.1 6.8  ALBUMIN 3.0* 2.8*   No results for input(s): LIPASE, AMYLASE in the last 168 hours. No results for input(s): AMMONIA in the last 168 hours. CBC:  Recent Labs Lab 03/13/14 1315 03/13/14 1811 03/14/14 0550 03/15/14 0535  WBC 17.7* 19.5* 16.9* 15.3*  NEUTROABS 9.7*  --  7.6 8.8*  HGB 6.1* 6.2* 6.2* 5.9*   HCT 17.4* 17.9* 17.7* 17.3*  MCV 96.1 96.2 96.7 100.0  PLT 343 312 277 204   Cardiac Enzymes: No results for input(s): CKTOTAL, CKMB, CKMBINDEX, TROPONINI in the last 168 hours. BNP (last 3 results) No results for input(s): BNP in the last 8760 hours.  ProBNP (last 3 results)  Recent Labs  07/06/13 1755 11/23/13 1937  PROBNP 482.9* 892.0*    CBG: No results for input(s): GLUCAP in the last 168 hours.  No results found for this or any previous visit (from the past 240 hour(s)).   Studies: No results found.  Scheduled Meds: . sodium chloride   Intravenous Once  . allopurinol  300 mg Oral Daily  . amLODipine  2.5 mg Oral Daily  . enoxaparin (LOVENOX) injection  40 mg Subcutaneous Q24H  . folic acid  1 mg Oral Daily  . HYDROmorphone      . HYDROmorphone  4 mg Oral Q4H  . methadone  10 mg Oral QHS  . metoprolol  50 mg Oral Daily  . senna-docusate  1 tablet Oral BID  . sodium bicarbonate  325 mg Oral Daily  . sodium polystyrene  30 g Oral BID  . Vitamin D (Ergocalciferol)  50,000 Units Oral Weekly   Continuous Infusions: . dextrose 5 % and 0.45% NaCl 10 mL/hr (03/15/14 1056)    Time spent 40  minutes.

## 2014-03-15 NOTE — Progress Notes (Signed)
Hgb 5.9. NP on call paged. No new orders placed. Will continue to monitor closely.

## 2014-03-15 NOTE — Progress Notes (Signed)
PHARMACY BRIEF NOTE: HYDROXYUREA   By [facility] policy, hydroxyurea is automatically held when any of the following laboratory values occur:  ANC < 2 K  Pltc < 80K in sickle-cell patients; < 100K in other patients  Hgb <= 6 in sickle-cell patients; < 8 in other patients  Reticulocytes < 80K when Hgb < 9  Hydroxyurea has been held (discontinued from profile) per policy.   Hgb: 5.9  Arley Phenixllen Ayzia Day RPh 03/15/2014, 9:59 AM Pager [phone]

## 2014-03-16 ENCOUNTER — Ambulatory Visit: Payer: Self-pay | Admitting: Internal Medicine

## 2014-03-16 LAB — CBC WITH DIFFERENTIAL/PLATELET
BASOS ABS: 0.1 10*3/uL (ref 0.0–0.1)
Basophils Relative: 1 % (ref 0–1)
EOS ABS: 0.8 10*3/uL — AB (ref 0.0–0.7)
EOS PCT: 6 % — AB (ref 0–5)
HCT: 18.4 % — ABNORMAL LOW (ref 39.0–52.0)
Hemoglobin: 6.5 g/dL — CL (ref 13.0–17.0)
LYMPHS PCT: 29 % (ref 12–46)
Lymphs Abs: 4 10*3/uL (ref 0.7–4.0)
MCH: 33.5 pg (ref 26.0–34.0)
MCHC: 35.3 g/dL (ref 30.0–36.0)
MCV: 94.8 fL (ref 78.0–100.0)
MONOS PCT: 10 % (ref 3–12)
Monocytes Absolute: 1.4 10*3/uL — ABNORMAL HIGH (ref 0.1–1.0)
NEUTROS ABS: 7.4 10*3/uL (ref 1.7–7.7)
Neutrophils Relative %: 54 % (ref 43–77)
PLATELETS: 240 10*3/uL (ref 150–400)
RBC: 1.94 MIL/uL — AB (ref 4.22–5.81)
RDW: 27.2 % — AB (ref 11.5–15.5)
WBC: 13.7 10*3/uL — AB (ref 4.0–10.5)
nRBC: 17 /100 WBC — ABNORMAL HIGH

## 2014-03-16 LAB — TYPE AND SCREEN
ABO/RH(D): A POS
Antibody Screen: NEGATIVE
UNIT DIVISION: 0

## 2014-03-16 LAB — BASIC METABOLIC PANEL
Anion gap: 5 (ref 5–15)
BUN: 14 mg/dL (ref 6–23)
CO2: 23 mmol/L (ref 19–32)
CREATININE: 0.81 mg/dL (ref 0.50–1.35)
Calcium: 7.9 mg/dL — ABNORMAL LOW (ref 8.4–10.5)
Chloride: 114 mmol/L — ABNORMAL HIGH (ref 96–112)
Glucose, Bld: 96 mg/dL (ref 70–99)
Potassium: 4.6 mmol/L (ref 3.5–5.1)
Sodium: 142 mmol/L (ref 135–145)

## 2014-03-16 NOTE — Discharge Summary (Signed)
Physician Discharge Summary  Patient ID: [name] MRN: [id number] DOB/AGE: 07-02-1970 44 y.o.  Admit date: 03/13/2014 Discharge date: 03/16/2014  Admission Diagnoses:  Discharge Diagnoses:  Active Problems:   Sickle cell anemia   Leukocytosis   Hyperkalemia   Sickle cell anemia with crisis   Sickle cell disease with crisis   Discharged Condition: good  Hospital Course: A 44 year old gentleman admitted with sickle cell painful crisis. 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 He was admitted from the day hospital and treated with IV Dilaudid PCA, Toradol and IV fluids. He was also transfused 1 unit of packed red blood cells. Hemoglobin has stabilized now at 6.5 close to his baseline. Patient has been on Kayexalate in the hospital due to significant hyperkalemia. He was supposed to be on Kayexalate at home but has not been taking it adequately. He has been advised take it at least daily or every other day to keep his potassium level low. He agreed to do that at this point. He will follow-up with Dr. Betsey Holiday in the clinic and continue his home medications.  Consults: None  Significant Diagnostic Studies: labs: CBC and CMP checked serially. Had Hemolytic crisis and received 1 unit PRBC  Treatments: IV hydration, analgesia: acetaminophen and Dilaudid and blood transfusion.  Discharge Exam: Blood pressure 120/58, pulse 74, temperature 97.5 F (36.4 C), temperature source Oral, resp. rate 18, height  (1.803 m), weight 83.008 kg (183 lb), SpO2 98 %. General appearance: alert, cooperative and no distress Eyes: conjunctivae/corneas clear. PERRL, EOM's intact. Fundi benign. Neck: no adenopathy, no carotid bruit, no JVD, supple, symmetrical, trachea midline and thyroid not enlarged, symmetric, no tenderness/mass/nodules Back: symmetric, no curvature. ROM normal. No CVA tenderness. Resp: clear to auscultation bilaterally Chest wall: no tenderness Cardio: regular  rate and rhythm, S1, S2 normal, no murmur, click, rub or gallop GI: soft, non-tender; bowel sounds normal; no masses,  no organomegaly Extremities: extremities normal, atraumatic, no cyanosis or edema Pulses: 2+ and symmetric Skin: Skin color, texture, turgor normal. No rashes or lesions Neurologic: Grossly normal  Disposition: 01-Home or Self Care     Medication List    TAKE these medications        allopurinol 300 MG tablet  Commonly known as:  ZYLOPRIM  Take 1 tablet (300 mg total) by mouth daily.     ALPRAZolam 0.25 MG tablet  Commonly known as:  XANAX  Take 1 tablet (0.25 mg total) by mouth 2 (two) times daily as needed for anxiety (anxiety). For anxiety     amLODipine 2.5 MG tablet  Commonly known as:  NORVASC  Take 1 tablet (2.5 mg total) by mouth daily.     ergocalciferol 50000 UNITS capsule  Commonly known as:  DRISDOL  Take 1 capsule (50,000 Units total) by mouth once a week.     folic acid 1 MG tablet  Commonly known as:  FOLVITE  Take 1 tablet (1 mg total) by mouth daily.     HYDROmorphone 4 MG tablet  Commonly known as:  DILAUDID  Take 1 tablet (4 mg total) by mouth every 4 (four) hours as needed for severe pain (pain).     hydroxyurea 500 MG capsule  Commonly known as:  HYDREA  Take 2 capsules (1,000 mg total) by mouth daily. May take with food to minimize GI side effects.     ibuprofen 800 MG tablet  Commonly known as:  ADVIL,MOTRIN  Take 1 tablet (800 mg total) by mouth every 8 (eight)  hours as needed for fever or moderate pain (pain & fever).     methadone 10 MG tablet  Commonly known as:  DOLOPHINE  Take 1 tablet (10 mg total) by mouth at bedtime.     metoprolol 50 MG tablet  Commonly known as:  LOPRESSOR  Take 1 tablet (50 mg total) by mouth daily.     zolpidem 10 MG tablet  Commonly known as:  AMBIEN  Take 1 tablet (10 mg total) by mouth at bedtime as needed for sleep (insomnia).         SignedLonia Blood: Shanah Guimaraes,LAWAL 03/16/2014, 3:16  PM  Time spent 33 minutes

## 2014-03-27 ENCOUNTER — Telehealth: Payer: Self-pay | Admitting: Internal Medicine

## 2014-03-27 NOTE — Telephone Encounter (Signed)
Patient was taken to the [facility] emergency room by ambulance and states his medication was given to the paramedics. Patient contacted the paramedics and they state his medication was given to the staff at the ED, and the staff at the ED advised the patient to check the lost and found. The lost and found stated they do not accept any medications in the lost and found. Patient filed a police report # [id number] [facility]([place], [place] Patient would like medications refilled as soon as possible.

## 2014-03-28 ENCOUNTER — Telehealth: Payer: Self-pay | Admitting: Hematology

## 2014-03-28 NOTE — Telephone Encounter (Signed)
Patient called in to see if he needed and appointment or to come in for labs.  Patient states he is afraid that his potassium is elevated and he is out of the medication he takes for it.  I explained I would talk with the physician and give him a call back.  Patient verbalizes understanding.

## 2014-03-28 NOTE — Telephone Encounter (Signed)
Left message on patient's answering machine that per dr. Luan MooreMatthews,he can come in for labs in the morning or at a time convenient for him.  Asked patient to give a call back today to verify he received the message.

## 2014-03-29 ENCOUNTER — Other Ambulatory Visit: Payer: Self-pay | Admitting: Internal Medicine

## 2014-03-29 ENCOUNTER — Other Ambulatory Visit (INDEPENDENT_AMBULATORY_CARE_PROVIDER_SITE_OTHER): Payer: BLUE CROSS/BLUE SHIELD

## 2014-03-29 DIAGNOSIS — D571 Sickle-cell disease without crisis: Secondary | ICD-10-CM

## 2014-03-29 LAB — COMPLETE METABOLIC PANEL WITH GFR
ALK PHOS: 115 U/L (ref 39–117)
ALT: 33 U/L (ref 0–53)
AST: 81 U/L — ABNORMAL HIGH (ref 0–37)
Albumin: 2.9 g/dL — ABNORMAL LOW (ref 3.5–5.2)
BUN: 16 mg/dL (ref 6–23)
CO2: 20 meq/L (ref 19–32)
Calcium: 8 mg/dL — ABNORMAL LOW (ref 8.4–10.5)
Chloride: 109 mEq/L (ref 96–112)
Creat: 0.81 mg/dL (ref 0.50–1.35)
GFR, Est Non African American: >89 mL/min
GLUCOSE: 81 mg/dL (ref 70–99)
POTASSIUM: 4.6 meq/L (ref 3.5–5.3)
Sodium: 136 mEq/L (ref 135–145)
TOTAL PROTEIN: 6.8 g/dL (ref 6.0–8.3)
Total Bilirubin: 4.7 mg/dL — ABNORMAL HIGH (ref 0.2–1.2)

## 2014-03-30 LAB — CBC WITH DIFFERENTIAL/PLATELET
Basophils Absolute: 0.3 10*3/uL — ABNORMAL HIGH (ref 0.0–0.1)
Basophils Relative: 3 % — ABNORMAL HIGH (ref 0–1)
EOS PCT: 5 % (ref 0–5)
Eosinophils Absolute: 0.5 10*3/uL (ref 0.0–0.7)
HEMATOCRIT: 30.2 % — AB (ref 39.0–52.0)
HEMOGLOBIN: 9.6 g/dL — AB (ref 13.0–17.0)
LYMPHS ABS: 3 10*3/uL (ref 0.7–4.0)
Lymphocytes Relative: 31 % (ref 12–46)
MCH: 29.6 pg (ref 26.0–34.0)
MCHC: 31.8 g/dL (ref 30.0–36.0)
MCV: 93.2 fL (ref 78.0–100.0)
MONOS PCT: 7 % (ref 3–12)
MPV: 10.1 fL (ref 8.6–12.4)
Monocytes Absolute: 0.7 10*3/uL (ref 0.1–1.0)
Neutro Abs: 5.2 10*3/uL (ref 1.7–7.7)
Neutrophils Relative %: 54 % (ref 43–77)
Platelets: 326 10*3/uL (ref 150–400)
RBC: 3.24 MIL/uL — AB (ref 4.22–5.81)
RDW: 20.3 % — ABNORMAL HIGH (ref 11.5–15.5)
WBC: 9.6 10*3/uL (ref 4.0–10.5)

## 2014-03-30 LAB — RETICULOCYTES
ABS Retic: 168.5 10*3/uL (ref 19.0–186.0)
RBC.: 3.24 MIL/uL — ABNORMAL LOW (ref 4.22–5.81)
RETIC CT PCT: 5.2 % — AB (ref 0.4–2.3)

## 2014-04-04 ENCOUNTER — Telehealth: Payer: Self-pay | Admitting: Internal Medicine

## 2014-04-04 DIAGNOSIS — D571 Sickle-cell disease without crisis: Secondary | ICD-10-CM

## 2014-04-04 MED ORDER — METHADONE HCL 10 MG PO TABS: 10.0000 mg | ORAL_TABLET | Freq: Every day | ORAL | Status: AC

## 2014-04-04 NOTE — Telephone Encounter (Signed)
Refill request for methadone 10mg . LOV 02/16/14. Please advise. Thanks!

## 2014-04-04 NOTE — Telephone Encounter (Signed)
Prescription written for Methadone 10 mg # 30 tabs. NCCSRS reviewed and no inconsistencies noted.

## 2014-05-30 ENCOUNTER — Telehealth: Payer: Self-pay

## 2014-05-30 DIAGNOSIS — G4700 Insomnia, unspecified: Secondary | ICD-10-CM

## 2014-05-30 NOTE — Telephone Encounter (Signed)
Refill request for zolpidem 10mg . LOV : 03/10/2014. Please advise. Thanks!

## 2014-05-31 MED ORDER — ZOLPIDEM TARTRATE 10 MG PO TABS: 10.0000 mg | ORAL_TABLET | Freq: Every evening | ORAL | Status: AC | PRN

## 2014-05-31 NOTE — Telephone Encounter (Signed)
Prescription written for Ambien 10 mg #30 tabs with 3 refills. NCCSRS reviewed and no inconsistencies noted.

## 2014-06-08 ENCOUNTER — Telehealth: Payer: Self-pay | Admitting: Internal Medicine

## 2014-06-08 ENCOUNTER — Other Ambulatory Visit: Payer: Self-pay | Admitting: Internal Medicine

## 2014-06-08 DIAGNOSIS — R809 Proteinuria, unspecified: Secondary | ICD-10-CM

## 2014-06-08 MED ORDER — AMLODIPINE BESYLATE 2.5 MG PO TABS: 2.5000 mg | ORAL_TABLET | Freq: Every day | ORAL | Status: DC

## 2014-06-08 NOTE — Telephone Encounter (Signed)
REFILL REQUEST FOR METHADONE 10MG . lov 02/16/2014. Please advise. Thanks!

## 2014-06-08 NOTE — Telephone Encounter (Signed)
norvasc sent into pharmacy and zolpidem was faxed in. Thanks!

## 2014-06-08 NOTE — Telephone Encounter (Signed)
Mr. [name] will need a follow up appointment prior to receiving opiate medications. He has not been seen since January 2016.   [name],[name] Stencil M, FNP

## 2014-06-12 ENCOUNTER — Ambulatory Visit (INDEPENDENT_AMBULATORY_CARE_PROVIDER_SITE_OTHER): Payer: BLUE CROSS/BLUE SHIELD | Admitting: Internal Medicine

## 2014-06-12 ENCOUNTER — Telehealth: Payer: Self-pay | Admitting: Internal Medicine

## 2014-06-12 ENCOUNTER — Encounter: Payer: Self-pay | Admitting: Internal Medicine

## 2014-06-12 VITALS — BP 112/65 | HR 76 | Temp 98.3°F | Resp 16 | Ht 71.0 in | Wt 170.0 lb

## 2014-06-12 DIAGNOSIS — D571 Sickle-cell disease without crisis: Secondary | ICD-10-CM | POA: Diagnosis not present

## 2014-06-12 DIAGNOSIS — E875 Hyperkalemia: Secondary | ICD-10-CM | POA: Diagnosis not present

## 2014-06-12 LAB — BASIC METABOLIC PANEL WITH GFR
BUN: 23 mg/dL (ref 6–23)
CHLORIDE: 109 meq/L (ref 96–112)
CO2: 24 mEq/L (ref 19–32)
Calcium: 8.6 mg/dL (ref 8.4–10.5)
Creat: 1.01 mg/dL (ref 0.50–1.35)
GFR, Est African American: >89 mL/min
GLUCOSE: 82 mg/dL (ref 70–99)
Potassium: 5.3 mEq/L (ref 3.5–5.3)
Sodium: 138 mEq/L (ref 135–145)

## 2014-06-12 LAB — FERRITIN: Ferritin: 3992 ng/mL — ABNORMAL HIGH (ref 22–322)

## 2014-06-12 MED ORDER — DEFERASIROX 360 MG PO TABS: 3.0000 | ORAL_TABLET | Freq: Every day | ORAL | Status: DC

## 2014-06-12 MED ORDER — HYDROXYUREA 500 MG PO CAPS: 500.0000 mg | ORAL_CAPSULE | Freq: Every day | ORAL | Status: DC

## 2014-06-12 NOTE — Progress Notes (Signed)
Patient ID: [name], male   DOB: [birth year], 44 y.o.   MRN: [id number]   [name] Amasslliott Hafley, is a 44 y.o. male  [id number]  [id number]  DOB - [birth year]  CC:  Chief Complaint  Patient presents with  . Follow-up       HPI: [name] Amasslliott Marcantonio is a 44 y.o. male here today to follow up on Hb SS. Since his last visit he was hospitalized with Acute Chest [facility] and required Red Cell Pheresis. Hehas since been seen in [facility]. He was prescribed Jadenu for hemosiderosis and as in the past when this was prescribed, patient has not started [place]. He was also prescribed Methadone and Dilaudid by Newman Piesavid Leedy and a review of his records shows that he signed a pain contract with Mr.Leedy for pain management. He however reports that he was told by Mr. Pamalee LeydenLeedy to see his PMD for pain management. Mr [name] has always been in compliance with his analgesics in the past and has had no unusual behaviors around the use of his pain medications.   I just spoke with Mr. Pamalee LeydenLeedy who reports that he was unaware of Mr. Guadlupe SpanishSalder's Sickle Cell Care at this office. He is happy to provide care for his Hb SS disease. I have spoken with [name] and although he is torn, he will pursue care of his SCD [place] and will [facility] when the need arises for hospital care of his SCD.  He was referred by me to the Nephrologist for repeated hyperkalemia. He was seen by Nephology and he was prescribed Kayexalate which he was taking prior to the visit however I have reviewed the notes and there is no discussion on the etiology. Pt does not have a follow up appointment scheduled with Nephrology. He is unsure of he is to continue Kayexalate.   I have had a discussion with patient about taking Hydrea which he has refused in the past. He is now willing to take Hydrea but since he will move his care to of [facility], it is  appropriate for them to prescribe and monitor the Hydrea. Pt has an appointment with Mr. Pamalee LeydenLeedy on 6/23 at which time his labs (CBC with diff and reticulocytes) can be evaluated by Hematology.  Patient has No headache, No chest pain, No abdominal pain - No bs Nausea, No new weakness tingling or numbness, No Cough - SOB.  Allergies  Allergen Reactions  . Toradol [Ketorolac Tromethamine]     Pt stated "it scars my kidneys"    Past Medical History  Diagnosis Date  . CHF (congestive heart failure)   . Gouty arthropathy   . Chronic fatigue syndrome   . Sickle cell anemia   . Anxiety   . Depression   . Hand joint pain 10/19/2012  . Hyperkalemia 03/25/2011  . SVT (supraventricular tachycardia)   . Renal tubular acidosis    Current Outpatient Prescriptions on File Prior to Visit  Medication Sig Dispense Refill  . ergocalciferol (DRISDOL) 50000 UNITS capsule Take 1 capsule (50,000 Units total) by mouth once a week. 4 capsule 12  . folic acid (FOLVITE) 1 MG tablet Take 1 tablet (1 mg total) by mouth daily. 30 tablet 11  . HYDROmorphone (DILAUDID) 4 MG tablet Take 1 tablet (4 mg total) by mouth every 4 (four) hours as needed for severe pain (pain). 60 tablet 0  . methadone (DOLOPHINE) 10 MG  tablet Take 1 tablet (10 mg total) by mouth at bedtime. 30 tablet 0  . metoprolol (LOPRESSOR) 50 MG tablet Take 1 tablet (50 mg total) by mouth daily. 60 tablet 3  . zolpidem (AMBIEN) 10 MG tablet Take 1 tablet (10 mg total) by mouth at bedtime as needed for sleep (insomnia). 30 tablet 3   No current facility-administered medications on file prior to visit.   Family History  Problem Relation Age of Onset  . Sickle cell anemia    . Sickle cell trait Mother    History   Social History  . Marital Status: Single    Spouse Name: N/A  . Number of Children: N/A  . Years of Education: N/A   Occupational History  . Not on file.   Social History Main Topics  . Smoking status: Never Smoker   . Smokeless  tobacco: Never Used  . Alcohol Use: No  . Drug Use: No  . Sexual Activity: No   Other Topics Concern  . 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  Estranged from his biological father.      Review of Systems: Constitutional: Negative for fever, chills, diaphoresis, activity change, appetite change and fatigue. HENT: Negative for ear pain, nosebleeds, congestion, facial swelling, rhinorrhea, neck pain, neck stiffness and ear discharge.  Eyes: Negative for pain, discharge, redness, itching and visual disturbance. Respiratory: Negative for cough, choking, chest tightness, shortness of breath, wheezing and stridor.  Cardiovascular: Negative for chest pain, palpitations and leg swelling. Gastrointestinal: Negative for abdominal distention. Genitourinary: Negative for dysuria, urgency, frequency, hematuria, flank pain, decreased urine volume, difficulty urinating and dyspareunia.  Musculoskeletal: Negative for back pain, joint swelling, arthralgia and gait problem. Neurological: Negative for dizziness, tremors, seizures, syncope, facial asymmetry, speech difficulty, weakness, light-headedness, numbness and headaches.  Hematological: Negative for adenopathy. Does not bruise/bleed easily. Psychiatric/Behavioral: Negative for hallucinations, behavioral problems, confusion, dysphoric mood, decreased concentration and agitation.     Objective:    Filed Vitals:   06/12/14 1310  BP: 112/65  Pulse: 76  Temp: 98.3 F (36.8 C)  Resp: 16    Physical Exam: Constitutional: Patient appears well-developed and well-nourished. No distress. HENT: Normocephalic, atraumatic, External right and left ear normal. Oropharynx is clear and moist.  Eyes: Conjunctivae and EOM are normal. PERRLA, no scleral icterus. Neck: Normal ROM. Neck supple. No JVD. No tracheal deviation. No thyromegaly. CVS: RRR, S1/S2 +, no murmurs, no gallops, no carotid bruit.  Pulmonary: Effort and breath sounds  normal, no stridor, rhonchi, wheezes, rales.  Abdominal: Soft. BS +, no distension, tenderness, rebound or guarding.  Musculoskeletal: Normal range of motion. No edema and no tenderness.  Lymphadenopathy: No lymphadenopathy noted, cervical, inguinal or axillary Neuro: Alert. Normal reflexes, muscle tone coordination. No cranial nerve deficit. Skin: Skin is warm and dry. No rash noted. Not diaphoretic. No erythema. No pallor. Psychiatric: Normal mood and affect. Behavior, judgment, thought content normal. Genitalia: External genitalia normal. Vaginal vault shows healthy tissue on visual inspection. No foul smelling discharge. Pt exhibits good lubrication with physiological discharge. No adnexal tenderness noted however there is a soft tissue mass felt on the anterior left vaginal vault.   Lab Results  Component Value Date   WBC 9.6 03/29/2014   HGB 9.6* 03/29/2014   HCT 30.2* 03/29/2014   MCV 93.2 03/29/2014   PLT 326 03/29/2014   Lab Results  Component Value Date   CREATININE 0.81 03/29/2014   BUN 16 03/29/2014  NA 136 03/29/2014   K 4.6 03/29/2014   CL 109 03/29/2014   CO2 20 03/29/2014    Lab Results  Component Value Date   HGBA1C <4.0 02/17/2012   Lipid Panel     Component Value Date/Time   CHOL 110 12/09/2011 1345   TRIG 201* 12/09/2011 1345   HDL 17* 12/09/2011 1345   CHOLHDL 6.5 12/09/2011 1345   VLDL 40 12/09/2011 1345   LDLCALC 53 12/09/2011 1345       Assessment and plan:   1. Hb-SS disease without crisis - Pt has been doing very well since his discharge from the hospital. He has transferred his SCD care to [facility] as he has moved to [facility] and it is more convenient for him. He has also agreed to start [facility] and he is required to go through a speciality Pharmacy. I will let Mr. Pamalee Leyden know so that Hydrea can be initiated ASAP through the Heamtologists office. - CBC - Differential - Reticulocytes   2. Hemochromatosis - Apparently Herma Ard  has already been ordered through [facility].  - Ferritin - Deferasirox (JADENU) 360 MG TABS; Take 3 tablets by mouth daily.  Dispense: 90 tablet; Refill: 3  3. Hyperkalemia - Etiology still unclear. However patient to avoid NSAIDS, and monitor Potassium levels monthly particularly in light of Methadone use oan potential for arrythmias - BASIC METABOLIC PANEL WITH GFR  Follow-up 3 months for Annual Physical, HTN and Hyperkalemia..  The patient was given clear instructions to go to ER or return to medical center if symptoms don't improve, worsen or new problems develop. The patient verbalized understanding. The patient was told to call to get lab results if they haven't heard anything in the next week.     This note has been created with Education officer, environmental. Any transcriptional errors are unintentional.    MATTHEWS,MICHELLE A., MD [facility] [place], [place] [phone]   06/12/2014, 2:10 PM

## 2014-06-12 NOTE — Telephone Encounter (Signed)
Pharmacy called stating RX need to be sent to the specialty pharmacy.

## 2014-06-12 NOTE — Telephone Encounter (Signed)
These have been sent in, Thanks!

## 2014-06-13 LAB — CBC
HCT: 20.2 % — ABNORMAL LOW (ref 39.0–52.0)
Hemoglobin: 7 g/dL — ABNORMAL LOW (ref 13.0–17.0)
MCH: 29.5 pg (ref 26.0–34.0)
MCHC: 34.7 g/dL (ref 30.0–36.0)
MCV: 85.2 fL (ref 78.0–100.0)
MPV: 8.6 fL (ref 8.6–12.4)
Platelets: 379 10*3/uL (ref 150–400)
RBC: 2.37 MIL/uL — ABNORMAL LOW (ref 4.22–5.81)
RDW: 23.9 % — AB (ref 11.5–15.5)
WBC: 16.9 10*3/uL — ABNORMAL HIGH (ref 4.0–10.5)

## 2014-06-13 LAB — DIFFERENTIAL
BASOS ABS: 0.2 10*3/uL — AB (ref 0.0–0.1)
Basophils Relative: 1 % (ref 0–1)
Eosinophils Absolute: 0.7 10*3/uL (ref 0.0–0.7)
Eosinophils Relative: 4 % (ref 0–5)
Lymphocytes Relative: 37 % (ref 12–46)
Lymphs Abs: 6.3 10*3/uL — ABNORMAL HIGH (ref 0.7–4.0)
Monocytes Absolute: 1.9 10*3/uL — ABNORMAL HIGH (ref 0.1–1.0)
Monocytes Relative: 11 % (ref 3–12)
Neutro Abs: 7.9 10*3/uL — ABNORMAL HIGH (ref 1.7–7.7)
Neutrophils Relative %: 47 % (ref 43–77)

## 2014-06-13 LAB — RETICULOCYTES: RBC.: 2.37 MIL/uL — ABNORMAL LOW (ref 4.22–5.81)

## 2014-06-25 IMAGING — CR DG CHEST 2V
2 series · 2 of 2 positions shown · non-contrast
Comparison: 12/09/2011.

CLINICAL DATA: Sickle cell crisis with shortness of breath and body
aches.  Low O2 sats.

CHEST - 2 VIEW

[w chest pa]
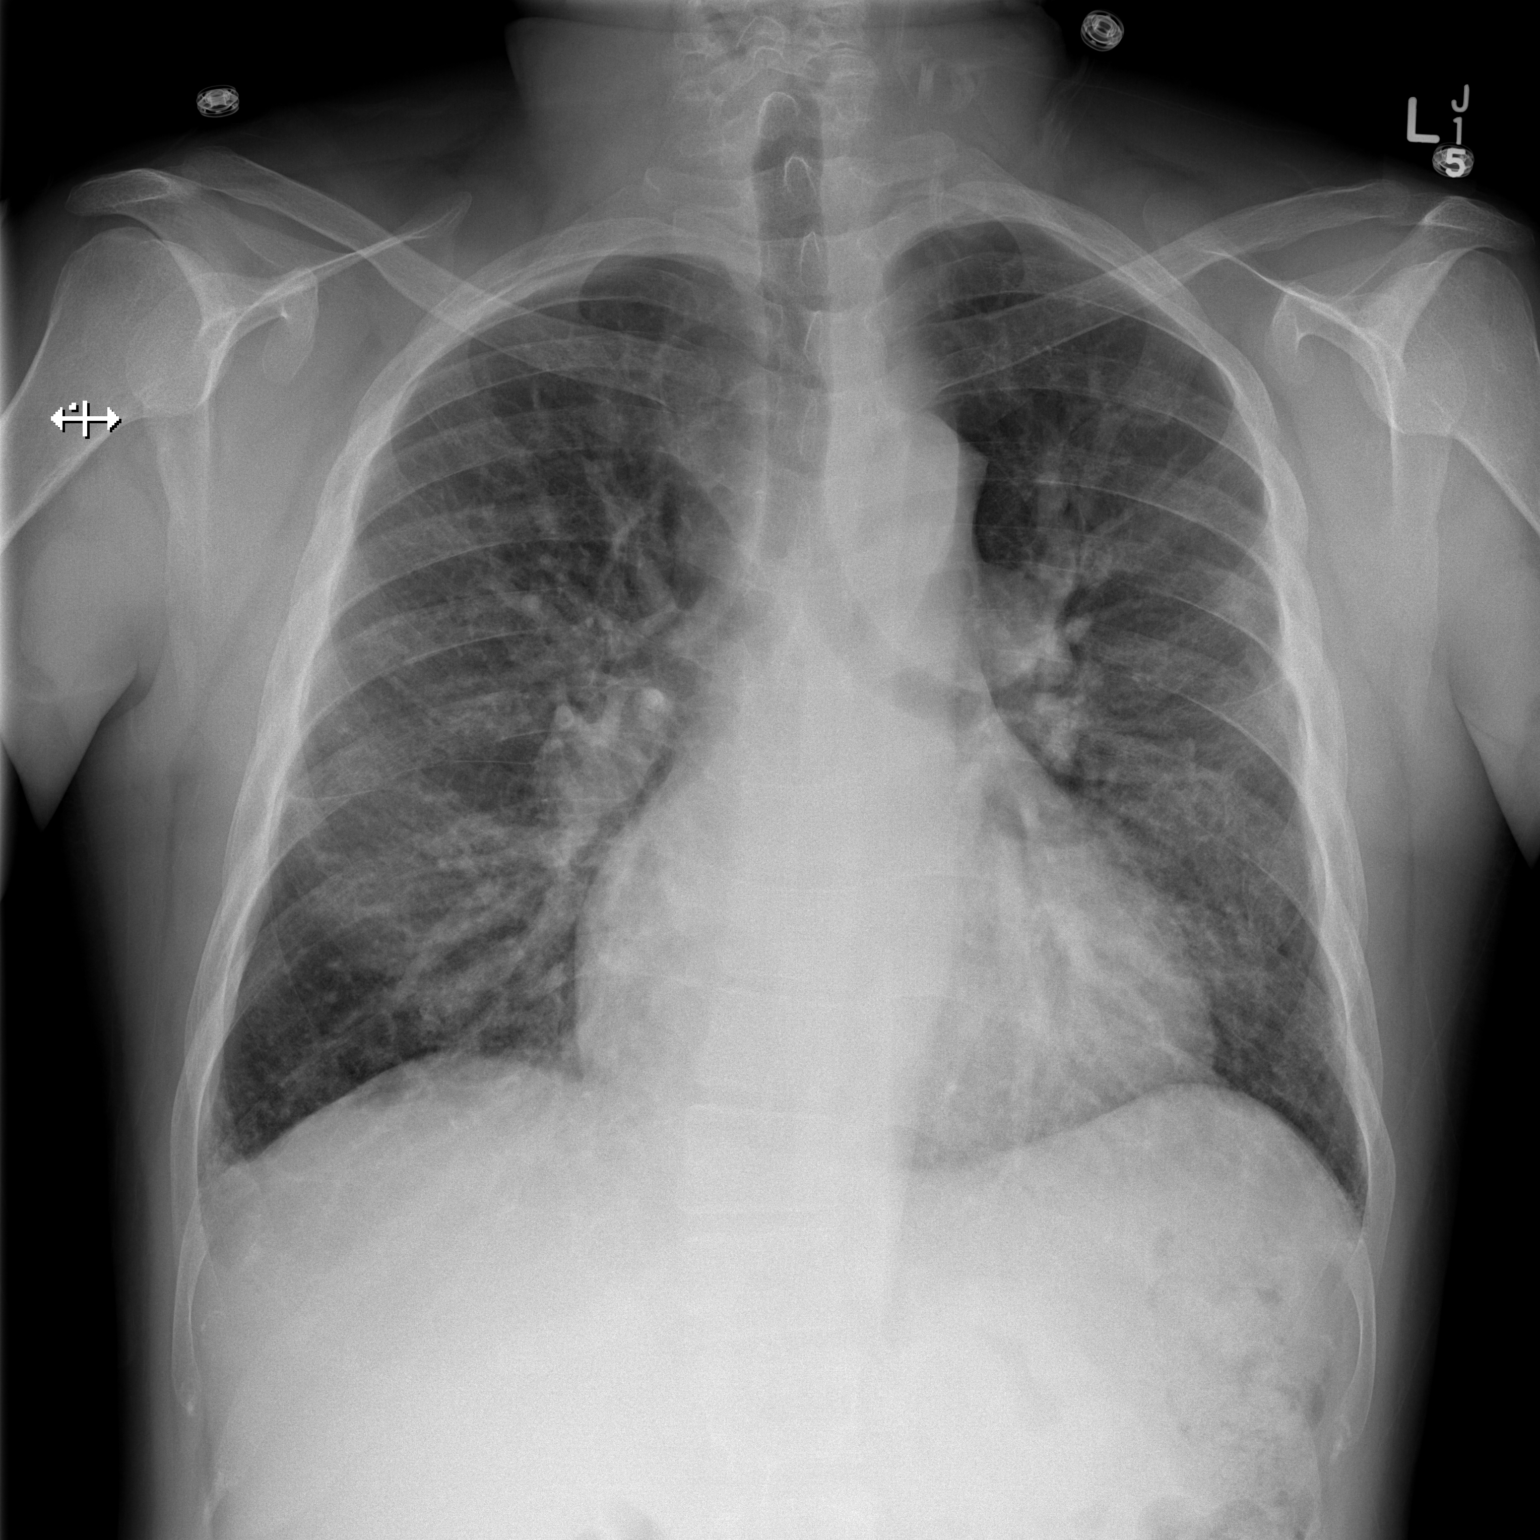

[w chest lat]
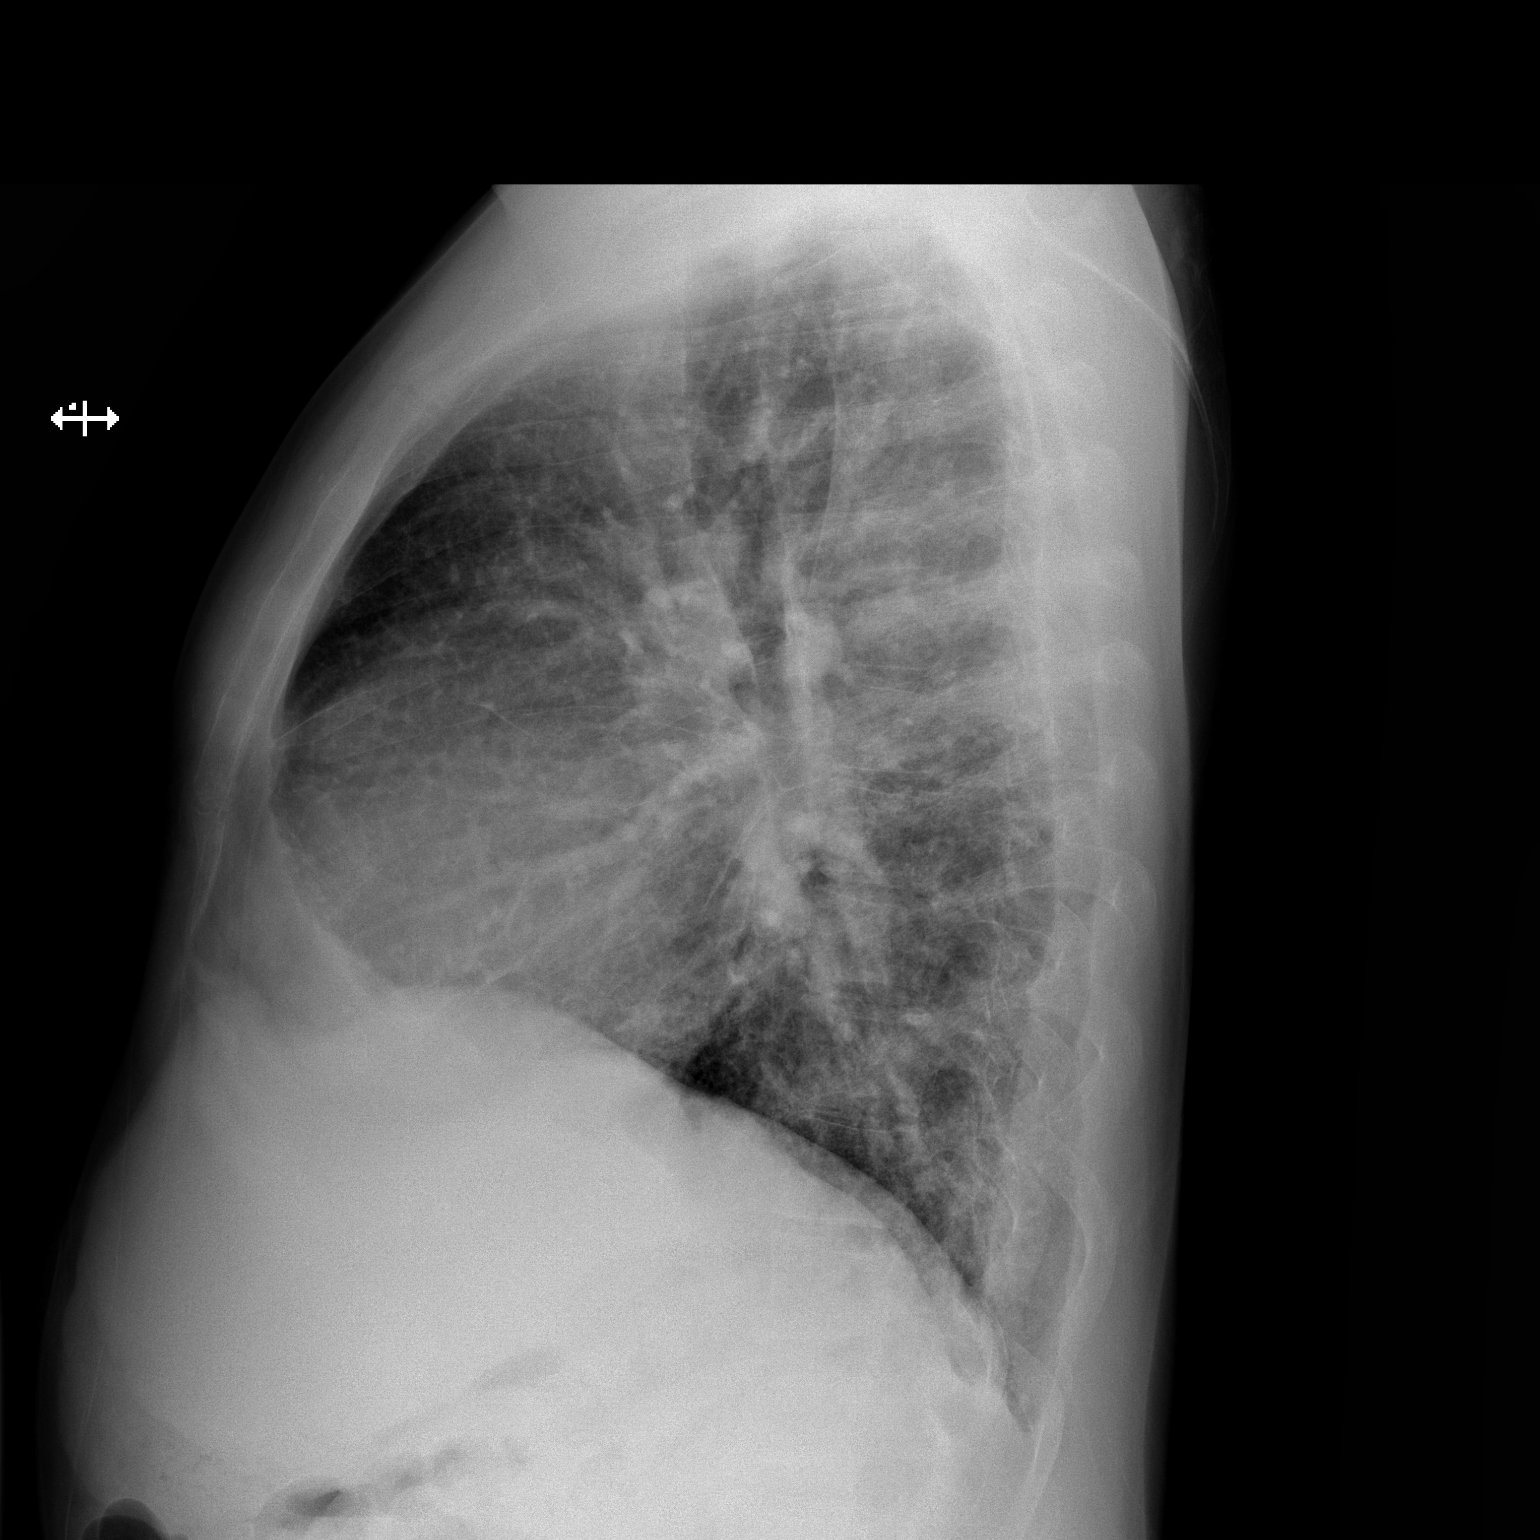

[2 of 2 positions shown; findings below may reference images not displayed]

FINDINGS: Trachea is midline.  Heart is mildly enlarged.  There is
mild diffuse bilateral air space disease.  No pleural fluid.
IMPRESSION: Mild diffuse bilateral air space disease which may be due to edema
or pneumonia.

## 2014-09-21 ENCOUNTER — Ambulatory Visit: Payer: BLUE CROSS/BLUE SHIELD | Admitting: Family Medicine

## 2015-01-22 ENCOUNTER — Encounter: Payer: Self-pay | Admitting: Internal Medicine

## 2018-03-08 NOTE — Telephone Encounter (Signed)
Close admin purpose

## 2019-11-04 DEATH — deceased
